# Patient Record
Sex: Female | Born: 1940 | Race: White | Hispanic: No | Smoking: Former smoker
Health system: Southern US, Community
[De-identification: ages and names within clinical notes are randomized; demographics above are authoritative.]

## PROBLEM LIST (undated history)

## (undated) DIAGNOSIS — G4730 Sleep apnea, unspecified: Secondary | ICD-10-CM

## (undated) DIAGNOSIS — D649 Anemia, unspecified: Secondary | ICD-10-CM

## (undated) DIAGNOSIS — IMO0001 Reserved for inherently not codable concepts without codable children: Secondary | ICD-10-CM

## (undated) DIAGNOSIS — I10 Essential (primary) hypertension: Secondary | ICD-10-CM

## (undated) DIAGNOSIS — I82409 Acute embolism and thrombosis of unspecified deep veins of unspecified lower extremity: Secondary | ICD-10-CM

## (undated) DIAGNOSIS — N289 Disorder of kidney and ureter, unspecified: Secondary | ICD-10-CM

## (undated) DIAGNOSIS — K219 Gastro-esophageal reflux disease without esophagitis: Secondary | ICD-10-CM

## (undated) DIAGNOSIS — F329 Major depressive disorder, single episode, unspecified: Secondary | ICD-10-CM

## (undated) DIAGNOSIS — I509 Heart failure, unspecified: Secondary | ICD-10-CM

## (undated) DIAGNOSIS — J189 Pneumonia, unspecified organism: Secondary | ICD-10-CM

## (undated) DIAGNOSIS — IMO0002 Reserved for concepts with insufficient information to code with codable children: Secondary | ICD-10-CM

## (undated) DIAGNOSIS — E7800 Pure hypercholesterolemia, unspecified: Secondary | ICD-10-CM

## (undated) DIAGNOSIS — M1990 Unspecified osteoarthritis, unspecified site: Secondary | ICD-10-CM

## (undated) DIAGNOSIS — K8020 Calculus of gallbladder without cholecystitis without obstruction: Secondary | ICD-10-CM

## (undated) DIAGNOSIS — G459 Transient cerebral ischemic attack, unspecified: Secondary | ICD-10-CM

## (undated) DIAGNOSIS — D509 Iron deficiency anemia, unspecified: Secondary | ICD-10-CM

## (undated) DIAGNOSIS — C3490 Malignant neoplasm of unspecified part of unspecified bronchus or lung: Secondary | ICD-10-CM

## (undated) DIAGNOSIS — G629 Polyneuropathy, unspecified: Secondary | ICD-10-CM

## (undated) DIAGNOSIS — E538 Deficiency of other specified B group vitamins: Secondary | ICD-10-CM

## (undated) DIAGNOSIS — Z9289 Personal history of other medical treatment: Secondary | ICD-10-CM

## (undated) DIAGNOSIS — J449 Chronic obstructive pulmonary disease, unspecified: Secondary | ICD-10-CM

## (undated) DIAGNOSIS — I739 Peripheral vascular disease, unspecified: Secondary | ICD-10-CM

## (undated) DIAGNOSIS — I2510 Atherosclerotic heart disease of native coronary artery without angina pectoris: Secondary | ICD-10-CM

## (undated) DIAGNOSIS — F32A Depression, unspecified: Secondary | ICD-10-CM

## (undated) DIAGNOSIS — E119 Type 2 diabetes mellitus without complications: Secondary | ICD-10-CM

## (undated) DIAGNOSIS — Z87440 Personal history of urinary (tract) infections: Secondary | ICD-10-CM

## (undated) DIAGNOSIS — K56609 Unspecified intestinal obstruction, unspecified as to partial versus complete obstruction: Secondary | ICD-10-CM

## (undated) HISTORY — PX: COLONOSCOPY W/ POLYPECTOMY: SHX1380

## (undated) HISTORY — PX: FOOT SURGERY: SHX648

## (undated) HISTORY — DX: Pure hypercholesterolemia, unspecified: E78.00

## (undated) HISTORY — DX: Peripheral vascular disease, unspecified: I73.9

## (undated) HISTORY — PX: LUNG BIOPSY: SHX232

## (undated) HISTORY — DX: Unspecified osteoarthritis, unspecified site: M19.90

## (undated) HISTORY — PX: EYE SURGERY: SHX253

## (undated) HISTORY — PX: TUBAL LIGATION: SHX77

## (undated) HISTORY — DX: Deficiency of other specified B group vitamins: E53.8

## (undated) HISTORY — DX: Reserved for concepts with insufficient information to code with codable children: IMO0002

## (undated) HISTORY — DX: Malignant neoplasm of unspecified part of unspecified bronchus or lung: C34.90

## (undated) HISTORY — DX: Reserved for inherently not codable concepts without codable children: IMO0001

## (undated) HISTORY — DX: Iron deficiency anemia, unspecified: D50.9

## (undated) HISTORY — DX: Heart failure, unspecified: I50.9

## (undated) HISTORY — PX: BREAST BIOPSY: SHX20

## (undated) HISTORY — DX: Atherosclerotic heart disease of native coronary artery without angina pectoris: I25.10

## (undated) HISTORY — DX: Sleep apnea, unspecified: G47.30

## (undated) HISTORY — PX: CARPAL TUNNEL RELEASE: SHX101

---

## 1988-03-23 HISTORY — PX: CAROTID ENDARTERECTOMY: SUR193

## 2011-03-30 DIAGNOSIS — E782 Mixed hyperlipidemia: Secondary | ICD-10-CM | POA: Diagnosis not present

## 2011-03-30 DIAGNOSIS — L821 Other seborrheic keratosis: Secondary | ICD-10-CM | POA: Diagnosis not present

## 2011-03-30 DIAGNOSIS — I10 Essential (primary) hypertension: Secondary | ICD-10-CM | POA: Diagnosis not present

## 2011-03-30 DIAGNOSIS — E119 Type 2 diabetes mellitus without complications: Secondary | ICD-10-CM | POA: Diagnosis not present

## 2011-04-15 DIAGNOSIS — I10 Essential (primary) hypertension: Secondary | ICD-10-CM | POA: Diagnosis not present

## 2011-04-30 DIAGNOSIS — I517 Cardiomegaly: Secondary | ICD-10-CM | POA: Diagnosis not present

## 2011-04-30 DIAGNOSIS — M810 Age-related osteoporosis without current pathological fracture: Secondary | ICD-10-CM | POA: Diagnosis not present

## 2011-04-30 DIAGNOSIS — R05 Cough: Secondary | ICD-10-CM | POA: Diagnosis not present

## 2011-04-30 DIAGNOSIS — R059 Cough, unspecified: Secondary | ICD-10-CM | POA: Diagnosis not present

## 2011-04-30 DIAGNOSIS — M069 Rheumatoid arthritis, unspecified: Secondary | ICD-10-CM | POA: Diagnosis not present

## 2011-05-13 DIAGNOSIS — Z885 Allergy status to narcotic agent status: Secondary | ICD-10-CM | POA: Diagnosis not present

## 2011-05-13 DIAGNOSIS — R195 Other fecal abnormalities: Secondary | ICD-10-CM | POA: Diagnosis not present

## 2011-05-13 DIAGNOSIS — K8020 Calculus of gallbladder without cholecystitis without obstruction: Secondary | ICD-10-CM | POA: Diagnosis not present

## 2011-05-13 DIAGNOSIS — R109 Unspecified abdominal pain: Secondary | ICD-10-CM | POA: Diagnosis not present

## 2011-05-13 DIAGNOSIS — K839 Disease of biliary tract, unspecified: Secondary | ICD-10-CM | POA: Diagnosis not present

## 2011-05-13 DIAGNOSIS — K922 Gastrointestinal hemorrhage, unspecified: Secondary | ICD-10-CM | POA: Diagnosis not present

## 2011-05-13 DIAGNOSIS — R911 Solitary pulmonary nodule: Secondary | ICD-10-CM | POA: Diagnosis not present

## 2011-05-13 DIAGNOSIS — Z87891 Personal history of nicotine dependence: Secondary | ICD-10-CM | POA: Diagnosis not present

## 2011-05-13 DIAGNOSIS — E119 Type 2 diabetes mellitus without complications: Secondary | ICD-10-CM | POA: Diagnosis not present

## 2011-05-13 DIAGNOSIS — R197 Diarrhea, unspecified: Secondary | ICD-10-CM | POA: Diagnosis not present

## 2011-05-22 DIAGNOSIS — K921 Melena: Secondary | ICD-10-CM | POA: Diagnosis not present

## 2011-06-08 DIAGNOSIS — D1430 Benign neoplasm of unspecified bronchus and lung: Secondary | ICD-10-CM | POA: Diagnosis not present

## 2011-06-08 DIAGNOSIS — K8020 Calculus of gallbladder without cholecystitis without obstruction: Secondary | ICD-10-CM | POA: Diagnosis not present

## 2011-06-09 DIAGNOSIS — M129 Arthropathy, unspecified: Secondary | ICD-10-CM | POA: Diagnosis not present

## 2011-06-09 DIAGNOSIS — K449 Diaphragmatic hernia without obstruction or gangrene: Secondary | ICD-10-CM | POA: Diagnosis not present

## 2011-06-09 DIAGNOSIS — Z79899 Other long term (current) drug therapy: Secondary | ICD-10-CM | POA: Diagnosis not present

## 2011-06-09 DIAGNOSIS — Z885 Allergy status to narcotic agent status: Secondary | ICD-10-CM | POA: Diagnosis not present

## 2011-06-09 DIAGNOSIS — K319 Disease of stomach and duodenum, unspecified: Secondary | ICD-10-CM | POA: Diagnosis not present

## 2011-06-09 DIAGNOSIS — G4733 Obstructive sleep apnea (adult) (pediatric): Secondary | ICD-10-CM | POA: Diagnosis not present

## 2011-06-09 DIAGNOSIS — K2960 Other gastritis without bleeding: Secondary | ICD-10-CM | POA: Diagnosis not present

## 2011-06-09 DIAGNOSIS — I8510 Secondary esophageal varices without bleeding: Secondary | ICD-10-CM | POA: Diagnosis not present

## 2011-06-09 DIAGNOSIS — K7460 Unspecified cirrhosis of liver: Secondary | ICD-10-CM | POA: Diagnosis not present

## 2011-06-09 DIAGNOSIS — Z794 Long term (current) use of insulin: Secondary | ICD-10-CM | POA: Diagnosis not present

## 2011-06-09 DIAGNOSIS — K922 Gastrointestinal hemorrhage, unspecified: Secondary | ICD-10-CM | POA: Diagnosis not present

## 2011-06-09 DIAGNOSIS — I10 Essential (primary) hypertension: Secondary | ICD-10-CM | POA: Diagnosis not present

## 2011-06-09 DIAGNOSIS — Z87891 Personal history of nicotine dependence: Secondary | ICD-10-CM | POA: Diagnosis not present

## 2011-06-09 DIAGNOSIS — E663 Overweight: Secondary | ICD-10-CM | POA: Diagnosis not present

## 2011-06-09 DIAGNOSIS — K219 Gastro-esophageal reflux disease without esophagitis: Secondary | ICD-10-CM | POA: Diagnosis not present

## 2011-06-09 DIAGNOSIS — E119 Type 2 diabetes mellitus without complications: Secondary | ICD-10-CM | POA: Diagnosis not present

## 2011-07-06 DIAGNOSIS — Z87891 Personal history of nicotine dependence: Secondary | ICD-10-CM | POA: Diagnosis not present

## 2011-07-06 DIAGNOSIS — M129 Arthropathy, unspecified: Secondary | ICD-10-CM | POA: Diagnosis not present

## 2011-07-06 DIAGNOSIS — K648 Other hemorrhoids: Secondary | ICD-10-CM | POA: Diagnosis not present

## 2011-07-06 DIAGNOSIS — K219 Gastro-esophageal reflux disease without esophagitis: Secondary | ICD-10-CM | POA: Diagnosis not present

## 2011-07-06 DIAGNOSIS — Z885 Allergy status to narcotic agent status: Secondary | ICD-10-CM | POA: Diagnosis not present

## 2011-07-06 DIAGNOSIS — I10 Essential (primary) hypertension: Secondary | ICD-10-CM | POA: Diagnosis not present

## 2011-07-06 DIAGNOSIS — Z79899 Other long term (current) drug therapy: Secondary | ICD-10-CM | POA: Diagnosis not present

## 2011-07-06 DIAGNOSIS — Z794 Long term (current) use of insulin: Secondary | ICD-10-CM | POA: Diagnosis not present

## 2011-07-06 DIAGNOSIS — G4733 Obstructive sleep apnea (adult) (pediatric): Secondary | ICD-10-CM | POA: Diagnosis not present

## 2011-07-06 DIAGNOSIS — K922 Gastrointestinal hemorrhage, unspecified: Secondary | ICD-10-CM | POA: Diagnosis not present

## 2011-07-06 DIAGNOSIS — E119 Type 2 diabetes mellitus without complications: Secondary | ICD-10-CM | POA: Diagnosis not present

## 2011-07-09 DIAGNOSIS — M069 Rheumatoid arthritis, unspecified: Secondary | ICD-10-CM | POA: Diagnosis not present

## 2011-07-09 DIAGNOSIS — R9389 Abnormal findings on diagnostic imaging of other specified body structures: Secondary | ICD-10-CM | POA: Diagnosis not present

## 2011-07-09 DIAGNOSIS — M25569 Pain in unspecified knee: Secondary | ICD-10-CM | POA: Diagnosis not present

## 2011-07-20 DIAGNOSIS — M25569 Pain in unspecified knee: Secondary | ICD-10-CM | POA: Diagnosis not present

## 2011-07-20 DIAGNOSIS — IMO0002 Reserved for concepts with insufficient information to code with codable children: Secondary | ICD-10-CM | POA: Diagnosis not present

## 2011-07-23 DIAGNOSIS — Z87891 Personal history of nicotine dependence: Secondary | ICD-10-CM | POA: Diagnosis not present

## 2011-07-23 DIAGNOSIS — R911 Solitary pulmonary nodule: Secondary | ICD-10-CM | POA: Diagnosis not present

## 2011-07-23 DIAGNOSIS — M069 Rheumatoid arthritis, unspecified: Secondary | ICD-10-CM | POA: Insufficient documentation

## 2011-07-23 DIAGNOSIS — E669 Obesity, unspecified: Secondary | ICD-10-CM | POA: Diagnosis not present

## 2011-07-24 DIAGNOSIS — G4733 Obstructive sleep apnea (adult) (pediatric): Secondary | ICD-10-CM | POA: Insufficient documentation

## 2011-07-24 DIAGNOSIS — M1990 Unspecified osteoarthritis, unspecified site: Secondary | ICD-10-CM | POA: Insufficient documentation

## 2011-07-24 DIAGNOSIS — J309 Allergic rhinitis, unspecified: Secondary | ICD-10-CM | POA: Insufficient documentation

## 2011-07-24 DIAGNOSIS — M25569 Pain in unspecified knee: Secondary | ICD-10-CM | POA: Diagnosis not present

## 2011-07-24 DIAGNOSIS — IMO0002 Reserved for concepts with insufficient information to code with codable children: Secondary | ICD-10-CM | POA: Diagnosis not present

## 2011-07-27 DIAGNOSIS — IMO0002 Reserved for concepts with insufficient information to code with codable children: Secondary | ICD-10-CM | POA: Diagnosis not present

## 2011-07-27 DIAGNOSIS — M25569 Pain in unspecified knee: Secondary | ICD-10-CM | POA: Diagnosis not present

## 2011-07-28 DIAGNOSIS — M1710 Unilateral primary osteoarthritis, unspecified knee: Secondary | ICD-10-CM | POA: Diagnosis not present

## 2011-07-28 DIAGNOSIS — M069 Rheumatoid arthritis, unspecified: Secondary | ICD-10-CM | POA: Diagnosis not present

## 2011-07-28 DIAGNOSIS — R9389 Abnormal findings on diagnostic imaging of other specified body structures: Secondary | ICD-10-CM | POA: Diagnosis not present

## 2011-07-28 DIAGNOSIS — M810 Age-related osteoporosis without current pathological fracture: Secondary | ICD-10-CM | POA: Diagnosis not present

## 2011-07-28 DIAGNOSIS — M2550 Pain in unspecified joint: Secondary | ICD-10-CM | POA: Diagnosis not present

## 2011-07-29 DIAGNOSIS — M25569 Pain in unspecified knee: Secondary | ICD-10-CM | POA: Diagnosis not present

## 2011-07-29 DIAGNOSIS — IMO0002 Reserved for concepts with insufficient information to code with codable children: Secondary | ICD-10-CM | POA: Diagnosis not present

## 2011-08-03 DIAGNOSIS — IMO0002 Reserved for concepts with insufficient information to code with codable children: Secondary | ICD-10-CM | POA: Diagnosis not present

## 2011-08-03 DIAGNOSIS — M25569 Pain in unspecified knee: Secondary | ICD-10-CM | POA: Diagnosis not present

## 2011-08-04 DIAGNOSIS — K8020 Calculus of gallbladder without cholecystitis without obstruction: Secondary | ICD-10-CM | POA: Insufficient documentation

## 2011-08-05 DIAGNOSIS — IMO0002 Reserved for concepts with insufficient information to code with codable children: Secondary | ICD-10-CM | POA: Diagnosis not present

## 2011-08-05 DIAGNOSIS — M25569 Pain in unspecified knee: Secondary | ICD-10-CM | POA: Diagnosis not present

## 2011-08-07 DIAGNOSIS — IMO0002 Reserved for concepts with insufficient information to code with codable children: Secondary | ICD-10-CM | POA: Diagnosis not present

## 2011-08-07 DIAGNOSIS — M25569 Pain in unspecified knee: Secondary | ICD-10-CM | POA: Diagnosis not present

## 2011-08-12 DIAGNOSIS — M25569 Pain in unspecified knee: Secondary | ICD-10-CM | POA: Diagnosis not present

## 2011-08-12 DIAGNOSIS — IMO0002 Reserved for concepts with insufficient information to code with codable children: Secondary | ICD-10-CM | POA: Diagnosis not present

## 2011-08-14 DIAGNOSIS — E119 Type 2 diabetes mellitus without complications: Secondary | ICD-10-CM | POA: Diagnosis not present

## 2011-08-14 DIAGNOSIS — I10 Essential (primary) hypertension: Secondary | ICD-10-CM | POA: Diagnosis not present

## 2011-08-14 DIAGNOSIS — M069 Rheumatoid arthritis, unspecified: Secondary | ICD-10-CM | POA: Diagnosis not present

## 2011-08-14 DIAGNOSIS — E782 Mixed hyperlipidemia: Secondary | ICD-10-CM | POA: Diagnosis not present

## 2011-08-24 DIAGNOSIS — IMO0002 Reserved for concepts with insufficient information to code with codable children: Secondary | ICD-10-CM | POA: Diagnosis not present

## 2011-08-24 DIAGNOSIS — M25569 Pain in unspecified knee: Secondary | ICD-10-CM | POA: Diagnosis not present

## 2011-08-26 DIAGNOSIS — M069 Rheumatoid arthritis, unspecified: Secondary | ICD-10-CM | POA: Diagnosis not present

## 2011-08-26 DIAGNOSIS — M159 Polyosteoarthritis, unspecified: Secondary | ICD-10-CM | POA: Diagnosis not present

## 2011-08-26 DIAGNOSIS — E119 Type 2 diabetes mellitus without complications: Secondary | ICD-10-CM | POA: Diagnosis not present

## 2011-08-26 DIAGNOSIS — M1710 Unilateral primary osteoarthritis, unspecified knee: Secondary | ICD-10-CM | POA: Diagnosis not present

## 2011-08-26 DIAGNOSIS — M25569 Pain in unspecified knee: Secondary | ICD-10-CM | POA: Diagnosis not present

## 2011-08-26 DIAGNOSIS — M2550 Pain in unspecified joint: Secondary | ICD-10-CM | POA: Diagnosis not present

## 2011-08-26 DIAGNOSIS — M810 Age-related osteoporosis without current pathological fracture: Secondary | ICD-10-CM | POA: Diagnosis not present

## 2011-09-11 DIAGNOSIS — R918 Other nonspecific abnormal finding of lung field: Secondary | ICD-10-CM | POA: Diagnosis not present

## 2011-09-11 DIAGNOSIS — E049 Nontoxic goiter, unspecified: Secondary | ICD-10-CM | POA: Diagnosis not present

## 2011-09-11 DIAGNOSIS — Z87891 Personal history of nicotine dependence: Secondary | ICD-10-CM | POA: Diagnosis not present

## 2011-09-11 DIAGNOSIS — Z8739 Personal history of other diseases of the musculoskeletal system and connective tissue: Secondary | ICD-10-CM | POA: Diagnosis not present

## 2011-09-11 DIAGNOSIS — Z885 Allergy status to narcotic agent status: Secondary | ICD-10-CM | POA: Diagnosis not present

## 2011-09-11 DIAGNOSIS — K8020 Calculus of gallbladder without cholecystitis without obstruction: Secondary | ICD-10-CM | POA: Diagnosis not present

## 2011-09-11 DIAGNOSIS — D71 Functional disorders of polymorphonuclear neutrophils: Secondary | ICD-10-CM | POA: Diagnosis not present

## 2011-09-11 DIAGNOSIS — R911 Solitary pulmonary nodule: Secondary | ICD-10-CM | POA: Diagnosis not present

## 2011-09-15 DIAGNOSIS — J449 Chronic obstructive pulmonary disease, unspecified: Secondary | ICD-10-CM | POA: Insufficient documentation

## 2011-09-15 DIAGNOSIS — R918 Other nonspecific abnormal finding of lung field: Secondary | ICD-10-CM | POA: Insufficient documentation

## 2011-09-15 DIAGNOSIS — M069 Rheumatoid arthritis, unspecified: Secondary | ICD-10-CM | POA: Diagnosis not present

## 2011-09-23 DIAGNOSIS — R918 Other nonspecific abnormal finding of lung field: Secondary | ICD-10-CM | POA: Diagnosis not present

## 2011-09-23 DIAGNOSIS — R911 Solitary pulmonary nodule: Secondary | ICD-10-CM | POA: Diagnosis not present

## 2011-09-23 DIAGNOSIS — E119 Type 2 diabetes mellitus without complications: Secondary | ICD-10-CM | POA: Diagnosis not present

## 2011-09-29 DIAGNOSIS — M810 Age-related osteoporosis without current pathological fracture: Secondary | ICD-10-CM | POA: Diagnosis not present

## 2011-10-15 DIAGNOSIS — M79609 Pain in unspecified limb: Secondary | ICD-10-CM | POA: Diagnosis not present

## 2011-10-20 DIAGNOSIS — F17200 Nicotine dependence, unspecified, uncomplicated: Secondary | ICD-10-CM | POA: Diagnosis not present

## 2011-10-20 DIAGNOSIS — I739 Peripheral vascular disease, unspecified: Secondary | ICD-10-CM | POA: Diagnosis not present

## 2011-10-20 DIAGNOSIS — Z8249 Family history of ischemic heart disease and other diseases of the circulatory system: Secondary | ICD-10-CM | POA: Diagnosis not present

## 2011-10-20 DIAGNOSIS — E119 Type 2 diabetes mellitus without complications: Secondary | ICD-10-CM | POA: Diagnosis not present

## 2011-10-20 DIAGNOSIS — I10 Essential (primary) hypertension: Secondary | ICD-10-CM | POA: Diagnosis not present

## 2011-10-20 DIAGNOSIS — Z833 Family history of diabetes mellitus: Secondary | ICD-10-CM | POA: Diagnosis not present

## 2011-10-20 DIAGNOSIS — I82409 Acute embolism and thrombosis of unspecified deep veins of unspecified lower extremity: Secondary | ICD-10-CM | POA: Diagnosis not present

## 2011-10-20 DIAGNOSIS — R609 Edema, unspecified: Secondary | ICD-10-CM | POA: Diagnosis not present

## 2011-10-20 DIAGNOSIS — G4733 Obstructive sleep apnea (adult) (pediatric): Secondary | ICD-10-CM | POA: Diagnosis not present

## 2011-10-20 DIAGNOSIS — Z79899 Other long term (current) drug therapy: Secondary | ICD-10-CM | POA: Diagnosis not present

## 2011-10-20 DIAGNOSIS — I824Y9 Acute embolism and thrombosis of unspecified deep veins of unspecified proximal lower extremity: Secondary | ICD-10-CM | POA: Diagnosis not present

## 2011-10-20 DIAGNOSIS — Z794 Long term (current) use of insulin: Secondary | ICD-10-CM | POA: Diagnosis not present

## 2011-10-22 DIAGNOSIS — I82409 Acute embolism and thrombosis of unspecified deep veins of unspecified lower extremity: Secondary | ICD-10-CM | POA: Diagnosis not present

## 2011-10-23 DIAGNOSIS — I82409 Acute embolism and thrombosis of unspecified deep veins of unspecified lower extremity: Secondary | ICD-10-CM | POA: Diagnosis not present

## 2011-10-26 DIAGNOSIS — I82409 Acute embolism and thrombosis of unspecified deep veins of unspecified lower extremity: Secondary | ICD-10-CM | POA: Diagnosis not present

## 2011-10-29 DIAGNOSIS — I82409 Acute embolism and thrombosis of unspecified deep veins of unspecified lower extremity: Secondary | ICD-10-CM | POA: Diagnosis not present

## 2011-10-29 DIAGNOSIS — M810 Age-related osteoporosis without current pathological fracture: Secondary | ICD-10-CM | POA: Diagnosis not present

## 2011-10-29 DIAGNOSIS — M069 Rheumatoid arthritis, unspecified: Secondary | ICD-10-CM | POA: Diagnosis not present

## 2011-11-02 DIAGNOSIS — I82409 Acute embolism and thrombosis of unspecified deep veins of unspecified lower extremity: Secondary | ICD-10-CM | POA: Diagnosis not present

## 2011-11-09 DIAGNOSIS — M069 Rheumatoid arthritis, unspecified: Secondary | ICD-10-CM | POA: Diagnosis not present

## 2011-11-09 DIAGNOSIS — I82409 Acute embolism and thrombosis of unspecified deep veins of unspecified lower extremity: Secondary | ICD-10-CM | POA: Diagnosis not present

## 2011-11-16 DIAGNOSIS — I82409 Acute embolism and thrombosis of unspecified deep veins of unspecified lower extremity: Secondary | ICD-10-CM | POA: Diagnosis not present

## 2011-11-24 DIAGNOSIS — I82409 Acute embolism and thrombosis of unspecified deep veins of unspecified lower extremity: Secondary | ICD-10-CM | POA: Diagnosis not present

## 2011-11-25 DIAGNOSIS — I2510 Atherosclerotic heart disease of native coronary artery without angina pectoris: Secondary | ICD-10-CM | POA: Diagnosis not present

## 2011-11-25 DIAGNOSIS — R918 Other nonspecific abnormal finding of lung field: Secondary | ICD-10-CM | POA: Diagnosis not present

## 2011-11-25 DIAGNOSIS — K8020 Calculus of gallbladder without cholecystitis without obstruction: Secondary | ICD-10-CM | POA: Diagnosis not present

## 2011-11-25 DIAGNOSIS — I2582 Chronic total occlusion of coronary artery: Secondary | ICD-10-CM | POA: Diagnosis not present

## 2011-11-25 DIAGNOSIS — R222 Localized swelling, mass and lump, trunk: Secondary | ICD-10-CM | POA: Diagnosis not present

## 2011-11-25 DIAGNOSIS — E041 Nontoxic single thyroid nodule: Secondary | ICD-10-CM | POA: Diagnosis not present

## 2011-11-25 DIAGNOSIS — I700 Atherosclerosis of aorta: Secondary | ICD-10-CM | POA: Diagnosis not present

## 2011-11-30 DIAGNOSIS — Z885 Allergy status to narcotic agent status: Secondary | ICD-10-CM | POA: Diagnosis not present

## 2011-11-30 DIAGNOSIS — J449 Chronic obstructive pulmonary disease, unspecified: Secondary | ICD-10-CM | POA: Diagnosis not present

## 2011-11-30 DIAGNOSIS — Z87891 Personal history of nicotine dependence: Secondary | ICD-10-CM | POA: Diagnosis not present

## 2011-11-30 DIAGNOSIS — R918 Other nonspecific abnormal finding of lung field: Secondary | ICD-10-CM | POA: Diagnosis not present

## 2011-11-30 DIAGNOSIS — Z7901 Long term (current) use of anticoagulants: Secondary | ICD-10-CM | POA: Diagnosis not present

## 2011-11-30 DIAGNOSIS — Z01812 Encounter for preprocedural laboratory examination: Secondary | ICD-10-CM | POA: Diagnosis not present

## 2011-11-30 DIAGNOSIS — Z01818 Encounter for other preprocedural examination: Secondary | ICD-10-CM | POA: Diagnosis not present

## 2011-11-30 DIAGNOSIS — E669 Obesity, unspecified: Secondary | ICD-10-CM | POA: Diagnosis not present

## 2011-12-03 DIAGNOSIS — E785 Hyperlipidemia, unspecified: Secondary | ICD-10-CM | POA: Diagnosis not present

## 2011-12-03 DIAGNOSIS — R918 Other nonspecific abnormal finding of lung field: Secondary | ICD-10-CM | POA: Diagnosis not present

## 2011-12-03 DIAGNOSIS — M949 Disorder of cartilage, unspecified: Secondary | ICD-10-CM | POA: Diagnosis not present

## 2011-12-03 DIAGNOSIS — G4733 Obstructive sleep apnea (adult) (pediatric): Secondary | ICD-10-CM | POA: Diagnosis not present

## 2011-12-03 DIAGNOSIS — E669 Obesity, unspecified: Secondary | ICD-10-CM | POA: Diagnosis not present

## 2011-12-03 DIAGNOSIS — R222 Localized swelling, mass and lump, trunk: Secondary | ICD-10-CM | POA: Diagnosis not present

## 2011-12-03 DIAGNOSIS — K219 Gastro-esophageal reflux disease without esophagitis: Secondary | ICD-10-CM | POA: Diagnosis not present

## 2011-12-03 DIAGNOSIS — E119 Type 2 diabetes mellitus without complications: Secondary | ICD-10-CM | POA: Diagnosis not present

## 2011-12-03 DIAGNOSIS — M1990 Unspecified osteoarthritis, unspecified site: Secondary | ICD-10-CM | POA: Diagnosis not present

## 2011-12-03 DIAGNOSIS — J449 Chronic obstructive pulmonary disease, unspecified: Secondary | ICD-10-CM | POA: Diagnosis not present

## 2011-12-03 DIAGNOSIS — I10 Essential (primary) hypertension: Secondary | ICD-10-CM | POA: Diagnosis not present

## 2011-12-03 DIAGNOSIS — Z9889 Other specified postprocedural states: Secondary | ICD-10-CM | POA: Diagnosis not present

## 2011-12-03 DIAGNOSIS — Z7901 Long term (current) use of anticoagulants: Secondary | ICD-10-CM | POA: Diagnosis not present

## 2011-12-03 DIAGNOSIS — Z87891 Personal history of nicotine dependence: Secondary | ICD-10-CM | POA: Diagnosis not present

## 2011-12-03 DIAGNOSIS — R0989 Other specified symptoms and signs involving the circulatory and respiratory systems: Secondary | ICD-10-CM | POA: Diagnosis not present

## 2011-12-03 DIAGNOSIS — M069 Rheumatoid arthritis, unspecified: Secondary | ICD-10-CM | POA: Diagnosis not present

## 2011-12-03 DIAGNOSIS — D491 Neoplasm of unspecified behavior of respiratory system: Secondary | ICD-10-CM | POA: Diagnosis not present

## 2011-12-03 DIAGNOSIS — I825Z9 Chronic embolism and thrombosis of unspecified deep veins of unspecified distal lower extremity: Secondary | ICD-10-CM | POA: Diagnosis not present

## 2011-12-08 DIAGNOSIS — I82409 Acute embolism and thrombosis of unspecified deep veins of unspecified lower extremity: Secondary | ICD-10-CM | POA: Diagnosis not present

## 2011-12-15 DIAGNOSIS — I10 Essential (primary) hypertension: Secondary | ICD-10-CM | POA: Diagnosis not present

## 2011-12-15 DIAGNOSIS — E782 Mixed hyperlipidemia: Secondary | ICD-10-CM | POA: Diagnosis not present

## 2011-12-15 DIAGNOSIS — E119 Type 2 diabetes mellitus without complications: Secondary | ICD-10-CM | POA: Diagnosis not present

## 2011-12-15 DIAGNOSIS — I82409 Acute embolism and thrombosis of unspecified deep veins of unspecified lower extremity: Secondary | ICD-10-CM | POA: Diagnosis not present

## 2011-12-15 DIAGNOSIS — Z23 Encounter for immunization: Secondary | ICD-10-CM | POA: Diagnosis not present

## 2011-12-22 DIAGNOSIS — Z01812 Encounter for preprocedural laboratory examination: Secondary | ICD-10-CM | POA: Diagnosis not present

## 2011-12-22 DIAGNOSIS — Z7901 Long term (current) use of anticoagulants: Secondary | ICD-10-CM | POA: Diagnosis not present

## 2011-12-23 DIAGNOSIS — M949 Disorder of cartilage, unspecified: Secondary | ICD-10-CM | POA: Diagnosis not present

## 2011-12-23 DIAGNOSIS — Z7901 Long term (current) use of anticoagulants: Secondary | ICD-10-CM | POA: Diagnosis not present

## 2011-12-23 DIAGNOSIS — R911 Solitary pulmonary nodule: Secondary | ICD-10-CM | POA: Diagnosis not present

## 2011-12-23 DIAGNOSIS — E119 Type 2 diabetes mellitus without complications: Secondary | ICD-10-CM | POA: Diagnosis not present

## 2011-12-23 DIAGNOSIS — K219 Gastro-esophageal reflux disease without esophagitis: Secondary | ICD-10-CM | POA: Diagnosis not present

## 2011-12-23 DIAGNOSIS — E042 Nontoxic multinodular goiter: Secondary | ICD-10-CM | POA: Diagnosis not present

## 2011-12-23 DIAGNOSIS — Z6838 Body mass index (BMI) 38.0-38.9, adult: Secondary | ICD-10-CM | POA: Diagnosis not present

## 2011-12-23 DIAGNOSIS — Z86718 Personal history of other venous thrombosis and embolism: Secondary | ICD-10-CM | POA: Diagnosis not present

## 2011-12-23 DIAGNOSIS — I517 Cardiomegaly: Secondary | ICD-10-CM | POA: Diagnosis not present

## 2011-12-23 DIAGNOSIS — H9190 Unspecified hearing loss, unspecified ear: Secondary | ICD-10-CM | POA: Diagnosis not present

## 2011-12-23 DIAGNOSIS — M129 Arthropathy, unspecified: Secondary | ICD-10-CM | POA: Diagnosis not present

## 2011-12-23 DIAGNOSIS — M069 Rheumatoid arthritis, unspecified: Secondary | ICD-10-CM | POA: Diagnosis not present

## 2011-12-23 DIAGNOSIS — I10 Essential (primary) hypertension: Secondary | ICD-10-CM | POA: Diagnosis not present

## 2011-12-23 DIAGNOSIS — R222 Localized swelling, mass and lump, trunk: Secondary | ICD-10-CM | POA: Diagnosis not present

## 2011-12-23 DIAGNOSIS — R0609 Other forms of dyspnea: Secondary | ICD-10-CM | POA: Diagnosis not present

## 2011-12-23 DIAGNOSIS — I2510 Atherosclerotic heart disease of native coronary artery without angina pectoris: Secondary | ICD-10-CM | POA: Diagnosis not present

## 2011-12-23 DIAGNOSIS — E669 Obesity, unspecified: Secondary | ICD-10-CM | POA: Diagnosis not present

## 2011-12-23 DIAGNOSIS — R918 Other nonspecific abnormal finding of lung field: Secondary | ICD-10-CM | POA: Diagnosis not present

## 2011-12-23 DIAGNOSIS — G4730 Sleep apnea, unspecified: Secondary | ICD-10-CM | POA: Diagnosis not present

## 2011-12-23 DIAGNOSIS — J449 Chronic obstructive pulmonary disease, unspecified: Secondary | ICD-10-CM | POA: Diagnosis not present

## 2011-12-23 DIAGNOSIS — Z885 Allergy status to narcotic agent status: Secondary | ICD-10-CM | POA: Diagnosis not present

## 2011-12-23 DIAGNOSIS — Z79899 Other long term (current) drug therapy: Secondary | ICD-10-CM | POA: Diagnosis not present

## 2011-12-23 DIAGNOSIS — J479 Bronchiectasis, uncomplicated: Secondary | ICD-10-CM | POA: Diagnosis not present

## 2011-12-23 DIAGNOSIS — Z87891 Personal history of nicotine dependence: Secondary | ICD-10-CM | POA: Diagnosis not present

## 2011-12-23 DIAGNOSIS — E785 Hyperlipidemia, unspecified: Secondary | ICD-10-CM | POA: Diagnosis not present

## 2011-12-24 DIAGNOSIS — I739 Peripheral vascular disease, unspecified: Secondary | ICD-10-CM | POA: Insufficient documentation

## 2011-12-24 DIAGNOSIS — I999 Unspecified disorder of circulatory system: Secondary | ICD-10-CM | POA: Insufficient documentation

## 2011-12-24 DIAGNOSIS — Z86718 Personal history of other venous thrombosis and embolism: Secondary | ICD-10-CM | POA: Insufficient documentation

## 2011-12-31 DIAGNOSIS — I10 Essential (primary) hypertension: Secondary | ICD-10-CM | POA: Diagnosis not present

## 2011-12-31 DIAGNOSIS — I82409 Acute embolism and thrombosis of unspecified deep veins of unspecified lower extremity: Secondary | ICD-10-CM | POA: Diagnosis not present

## 2012-01-05 DIAGNOSIS — M069 Rheumatoid arthritis, unspecified: Secondary | ICD-10-CM | POA: Diagnosis not present

## 2012-01-18 DIAGNOSIS — E119 Type 2 diabetes mellitus without complications: Secondary | ICD-10-CM | POA: Diagnosis not present

## 2012-01-18 DIAGNOSIS — R9389 Abnormal findings on diagnostic imaging of other specified body structures: Secondary | ICD-10-CM | POA: Diagnosis not present

## 2012-01-18 DIAGNOSIS — M1710 Unilateral primary osteoarthritis, unspecified knee: Secondary | ICD-10-CM | POA: Diagnosis not present

## 2012-01-18 DIAGNOSIS — M810 Age-related osteoporosis without current pathological fracture: Secondary | ICD-10-CM | POA: Diagnosis not present

## 2012-01-18 DIAGNOSIS — I10 Essential (primary) hypertension: Secondary | ICD-10-CM | POA: Diagnosis not present

## 2012-01-18 DIAGNOSIS — M159 Polyosteoarthritis, unspecified: Secondary | ICD-10-CM | POA: Diagnosis not present

## 2012-01-18 DIAGNOSIS — M069 Rheumatoid arthritis, unspecified: Secondary | ICD-10-CM | POA: Diagnosis not present

## 2012-01-20 DIAGNOSIS — I82409 Acute embolism and thrombosis of unspecified deep veins of unspecified lower extremity: Secondary | ICD-10-CM | POA: Diagnosis not present

## 2012-02-02 DIAGNOSIS — M069 Rheumatoid arthritis, unspecified: Secondary | ICD-10-CM | POA: Diagnosis not present

## 2012-02-03 DIAGNOSIS — I82409 Acute embolism and thrombosis of unspecified deep veins of unspecified lower extremity: Secondary | ICD-10-CM | POA: Diagnosis not present

## 2012-02-04 DIAGNOSIS — M069 Rheumatoid arthritis, unspecified: Secondary | ICD-10-CM | POA: Diagnosis not present

## 2012-02-04 DIAGNOSIS — M810 Age-related osteoporosis without current pathological fracture: Secondary | ICD-10-CM | POA: Diagnosis not present

## 2012-02-04 DIAGNOSIS — E559 Vitamin D deficiency, unspecified: Secondary | ICD-10-CM | POA: Diagnosis not present

## 2012-02-24 DIAGNOSIS — I82409 Acute embolism and thrombosis of unspecified deep veins of unspecified lower extremity: Secondary | ICD-10-CM | POA: Diagnosis not present

## 2012-02-26 DIAGNOSIS — M810 Age-related osteoporosis without current pathological fracture: Secondary | ICD-10-CM | POA: Diagnosis not present

## 2012-03-17 DIAGNOSIS — I82409 Acute embolism and thrombosis of unspecified deep veins of unspecified lower extremity: Secondary | ICD-10-CM | POA: Diagnosis not present

## 2012-03-23 DIAGNOSIS — J189 Pneumonia, unspecified organism: Secondary | ICD-10-CM

## 2012-03-23 HISTORY — DX: Pneumonia, unspecified organism: J18.9

## 2012-04-07 DIAGNOSIS — I82409 Acute embolism and thrombosis of unspecified deep veins of unspecified lower extremity: Secondary | ICD-10-CM | POA: Diagnosis not present

## 2012-05-12 DIAGNOSIS — M069 Rheumatoid arthritis, unspecified: Secondary | ICD-10-CM | POA: Diagnosis not present

## 2012-05-12 DIAGNOSIS — M810 Age-related osteoporosis without current pathological fracture: Secondary | ICD-10-CM | POA: Diagnosis not present

## 2012-05-12 DIAGNOSIS — E559 Vitamin D deficiency, unspecified: Secondary | ICD-10-CM | POA: Diagnosis not present

## 2012-05-23 DIAGNOSIS — E119 Type 2 diabetes mellitus without complications: Secondary | ICD-10-CM | POA: Diagnosis not present

## 2012-05-23 DIAGNOSIS — E782 Mixed hyperlipidemia: Secondary | ICD-10-CM | POA: Diagnosis not present

## 2012-05-23 DIAGNOSIS — I10 Essential (primary) hypertension: Secondary | ICD-10-CM | POA: Diagnosis not present

## 2012-05-23 DIAGNOSIS — K219 Gastro-esophageal reflux disease without esophagitis: Secondary | ICD-10-CM | POA: Diagnosis not present

## 2012-06-01 DIAGNOSIS — I10 Essential (primary) hypertension: Secondary | ICD-10-CM | POA: Diagnosis not present

## 2012-06-01 DIAGNOSIS — E119 Type 2 diabetes mellitus without complications: Secondary | ICD-10-CM | POA: Diagnosis not present

## 2012-06-01 DIAGNOSIS — E782 Mixed hyperlipidemia: Secondary | ICD-10-CM | POA: Diagnosis not present

## 2012-06-02 DIAGNOSIS — M069 Rheumatoid arthritis, unspecified: Secondary | ICD-10-CM | POA: Diagnosis not present

## 2012-06-16 DIAGNOSIS — M069 Rheumatoid arthritis, unspecified: Secondary | ICD-10-CM | POA: Diagnosis not present

## 2012-06-21 DIAGNOSIS — Z87891 Personal history of nicotine dependence: Secondary | ICD-10-CM | POA: Diagnosis not present

## 2012-06-21 DIAGNOSIS — J449 Chronic obstructive pulmonary disease, unspecified: Secondary | ICD-10-CM | POA: Diagnosis not present

## 2012-06-21 DIAGNOSIS — E669 Obesity, unspecified: Secondary | ICD-10-CM | POA: Insufficient documentation

## 2012-06-21 DIAGNOSIS — R918 Other nonspecific abnormal finding of lung field: Secondary | ICD-10-CM | POA: Diagnosis not present

## 2012-06-21 DIAGNOSIS — IMO0002 Reserved for concepts with insufficient information to code with codable children: Secondary | ICD-10-CM | POA: Insufficient documentation

## 2012-06-21 DIAGNOSIS — R0989 Other specified symptoms and signs involving the circulatory and respiratory systems: Secondary | ICD-10-CM | POA: Diagnosis not present

## 2012-06-27 DIAGNOSIS — I82409 Acute embolism and thrombosis of unspecified deep veins of unspecified lower extremity: Secondary | ICD-10-CM | POA: Diagnosis not present

## 2012-07-20 DIAGNOSIS — I749 Embolism and thrombosis of unspecified artery: Secondary | ICD-10-CM | POA: Diagnosis not present

## 2012-07-27 DIAGNOSIS — E119 Type 2 diabetes mellitus without complications: Secondary | ICD-10-CM | POA: Diagnosis not present

## 2012-07-27 DIAGNOSIS — I82409 Acute embolism and thrombosis of unspecified deep veins of unspecified lower extremity: Secondary | ICD-10-CM | POA: Diagnosis not present

## 2012-07-27 DIAGNOSIS — I6529 Occlusion and stenosis of unspecified carotid artery: Secondary | ICD-10-CM | POA: Insufficient documentation

## 2012-07-27 DIAGNOSIS — F32A Depression, unspecified: Secondary | ICD-10-CM | POA: Insufficient documentation

## 2012-07-27 DIAGNOSIS — F329 Major depressive disorder, single episode, unspecified: Secondary | ICD-10-CM | POA: Insufficient documentation

## 2012-07-27 DIAGNOSIS — M069 Rheumatoid arthritis, unspecified: Secondary | ICD-10-CM | POA: Diagnosis not present

## 2012-07-27 DIAGNOSIS — H9190 Unspecified hearing loss, unspecified ear: Secondary | ICD-10-CM | POA: Insufficient documentation

## 2012-07-27 DIAGNOSIS — J449 Chronic obstructive pulmonary disease, unspecified: Secondary | ICD-10-CM | POA: Diagnosis not present

## 2012-07-28 DIAGNOSIS — I10 Essential (primary) hypertension: Secondary | ICD-10-CM | POA: Diagnosis not present

## 2012-07-28 DIAGNOSIS — I749 Embolism and thrombosis of unspecified artery: Secondary | ICD-10-CM | POA: Diagnosis not present

## 2012-07-28 DIAGNOSIS — I82409 Acute embolism and thrombosis of unspecified deep veins of unspecified lower extremity: Secondary | ICD-10-CM | POA: Diagnosis not present

## 2012-07-28 DIAGNOSIS — E785 Hyperlipidemia, unspecified: Secondary | ICD-10-CM | POA: Diagnosis not present

## 2012-08-01 DIAGNOSIS — J449 Chronic obstructive pulmonary disease, unspecified: Secondary | ICD-10-CM | POA: Diagnosis not present

## 2012-08-01 DIAGNOSIS — E785 Hyperlipidemia, unspecified: Secondary | ICD-10-CM | POA: Diagnosis not present

## 2012-08-01 DIAGNOSIS — Z86718 Personal history of other venous thrombosis and embolism: Secondary | ICD-10-CM | POA: Diagnosis not present

## 2012-08-01 DIAGNOSIS — Z86711 Personal history of pulmonary embolism: Secondary | ICD-10-CM | POA: Diagnosis not present

## 2012-08-01 DIAGNOSIS — I10 Essential (primary) hypertension: Secondary | ICD-10-CM | POA: Diagnosis not present

## 2012-08-01 DIAGNOSIS — E119 Type 2 diabetes mellitus without complications: Secondary | ICD-10-CM | POA: Diagnosis not present

## 2012-08-01 DIAGNOSIS — M069 Rheumatoid arthritis, unspecified: Secondary | ICD-10-CM | POA: Diagnosis not present

## 2012-08-01 DIAGNOSIS — I749 Embolism and thrombosis of unspecified artery: Secondary | ICD-10-CM | POA: Diagnosis not present

## 2012-08-01 DIAGNOSIS — M810 Age-related osteoporosis without current pathological fracture: Secondary | ICD-10-CM | POA: Diagnosis not present

## 2012-08-17 DIAGNOSIS — I749 Embolism and thrombosis of unspecified artery: Secondary | ICD-10-CM | POA: Diagnosis not present

## 2012-09-01 DIAGNOSIS — K219 Gastro-esophageal reflux disease without esophagitis: Secondary | ICD-10-CM | POA: Diagnosis not present

## 2012-09-01 DIAGNOSIS — E119 Type 2 diabetes mellitus without complications: Secondary | ICD-10-CM | POA: Diagnosis not present

## 2012-09-01 DIAGNOSIS — E875 Hyperkalemia: Secondary | ICD-10-CM | POA: Diagnosis not present

## 2012-09-01 DIAGNOSIS — N289 Disorder of kidney and ureter, unspecified: Secondary | ICD-10-CM | POA: Insufficient documentation

## 2012-09-01 DIAGNOSIS — I10 Essential (primary) hypertension: Secondary | ICD-10-CM | POA: Diagnosis not present

## 2012-09-01 DIAGNOSIS — D649 Anemia, unspecified: Secondary | ICD-10-CM | POA: Diagnosis not present

## 2012-09-01 DIAGNOSIS — I749 Embolism and thrombosis of unspecified artery: Secondary | ICD-10-CM | POA: Diagnosis not present

## 2012-09-01 DIAGNOSIS — M069 Rheumatoid arthritis, unspecified: Secondary | ICD-10-CM | POA: Diagnosis not present

## 2012-09-12 DIAGNOSIS — I749 Embolism and thrombosis of unspecified artery: Secondary | ICD-10-CM | POA: Diagnosis not present

## 2012-09-19 DIAGNOSIS — I749 Embolism and thrombosis of unspecified artery: Secondary | ICD-10-CM | POA: Diagnosis not present

## 2012-09-26 DIAGNOSIS — I749 Embolism and thrombosis of unspecified artery: Secondary | ICD-10-CM | POA: Diagnosis not present

## 2012-09-26 DIAGNOSIS — J449 Chronic obstructive pulmonary disease, unspecified: Secondary | ICD-10-CM | POA: Diagnosis not present

## 2012-09-26 DIAGNOSIS — K8020 Calculus of gallbladder without cholecystitis without obstruction: Secondary | ICD-10-CM | POA: Diagnosis not present

## 2012-09-26 DIAGNOSIS — N289 Disorder of kidney and ureter, unspecified: Secondary | ICD-10-CM | POA: Diagnosis not present

## 2012-09-26 DIAGNOSIS — J984 Other disorders of lung: Secondary | ICD-10-CM | POA: Diagnosis not present

## 2012-09-26 DIAGNOSIS — R509 Fever, unspecified: Secondary | ICD-10-CM | POA: Diagnosis not present

## 2012-09-29 DIAGNOSIS — N289 Disorder of kidney and ureter, unspecified: Secondary | ICD-10-CM | POA: Diagnosis not present

## 2012-09-29 DIAGNOSIS — R509 Fever, unspecified: Secondary | ICD-10-CM | POA: Diagnosis not present

## 2012-09-29 DIAGNOSIS — E875 Hyperkalemia: Secondary | ICD-10-CM | POA: Diagnosis not present

## 2012-09-29 DIAGNOSIS — I10 Essential (primary) hypertension: Secondary | ICD-10-CM | POA: Diagnosis not present

## 2012-10-01 DIAGNOSIS — K219 Gastro-esophageal reflux disease without esophagitis: Secondary | ICD-10-CM | POA: Diagnosis not present

## 2012-10-01 DIAGNOSIS — Z87891 Personal history of nicotine dependence: Secondary | ICD-10-CM | POA: Diagnosis not present

## 2012-10-01 DIAGNOSIS — Z794 Long term (current) use of insulin: Secondary | ICD-10-CM | POA: Diagnosis not present

## 2012-10-01 DIAGNOSIS — J90 Pleural effusion, not elsewhere classified: Secondary | ICD-10-CM | POA: Diagnosis not present

## 2012-10-01 DIAGNOSIS — J189 Pneumonia, unspecified organism: Secondary | ICD-10-CM | POA: Diagnosis not present

## 2012-10-01 DIAGNOSIS — Z86718 Personal history of other venous thrombosis and embolism: Secondary | ICD-10-CM | POA: Diagnosis not present

## 2012-10-01 DIAGNOSIS — R040 Epistaxis: Secondary | ICD-10-CM | POA: Diagnosis not present

## 2012-10-01 DIAGNOSIS — R791 Abnormal coagulation profile: Secondary | ICD-10-CM | POA: Diagnosis not present

## 2012-10-01 DIAGNOSIS — I129 Hypertensive chronic kidney disease with stage 1 through stage 4 chronic kidney disease, or unspecified chronic kidney disease: Secondary | ICD-10-CM | POA: Diagnosis not present

## 2012-10-01 DIAGNOSIS — J449 Chronic obstructive pulmonary disease, unspecified: Secondary | ICD-10-CM | POA: Diagnosis not present

## 2012-10-01 DIAGNOSIS — N189 Chronic kidney disease, unspecified: Secondary | ICD-10-CM | POA: Diagnosis not present

## 2012-10-01 DIAGNOSIS — M069 Rheumatoid arthritis, unspecified: Secondary | ICD-10-CM | POA: Diagnosis not present

## 2012-10-01 DIAGNOSIS — E785 Hyperlipidemia, unspecified: Secondary | ICD-10-CM | POA: Diagnosis not present

## 2012-10-01 DIAGNOSIS — E119 Type 2 diabetes mellitus without complications: Secondary | ICD-10-CM | POA: Diagnosis not present

## 2012-10-01 DIAGNOSIS — R011 Cardiac murmur, unspecified: Secondary | ICD-10-CM | POA: Diagnosis not present

## 2012-10-02 DIAGNOSIS — R791 Abnormal coagulation profile: Secondary | ICD-10-CM | POA: Diagnosis present

## 2012-10-02 DIAGNOSIS — R0602 Shortness of breath: Secondary | ICD-10-CM | POA: Diagnosis not present

## 2012-10-02 DIAGNOSIS — R040 Epistaxis: Secondary | ICD-10-CM | POA: Diagnosis not present

## 2012-10-02 DIAGNOSIS — I10 Essential (primary) hypertension: Secondary | ICD-10-CM | POA: Diagnosis not present

## 2012-10-02 DIAGNOSIS — I129 Hypertensive chronic kidney disease with stage 1 through stage 4 chronic kidney disease, or unspecified chronic kidney disease: Secondary | ICD-10-CM | POA: Diagnosis present

## 2012-10-02 DIAGNOSIS — M069 Rheumatoid arthritis, unspecified: Secondary | ICD-10-CM | POA: Diagnosis present

## 2012-10-02 DIAGNOSIS — J90 Pleural effusion, not elsewhere classified: Secondary | ICD-10-CM | POA: Diagnosis not present

## 2012-10-02 DIAGNOSIS — J189 Pneumonia, unspecified organism: Secondary | ICD-10-CM | POA: Diagnosis not present

## 2012-10-02 DIAGNOSIS — Z794 Long term (current) use of insulin: Secondary | ICD-10-CM | POA: Diagnosis not present

## 2012-10-02 DIAGNOSIS — N189 Chronic kidney disease, unspecified: Secondary | ICD-10-CM | POA: Diagnosis present

## 2012-10-02 DIAGNOSIS — J449 Chronic obstructive pulmonary disease, unspecified: Secondary | ICD-10-CM | POA: Diagnosis not present

## 2012-10-02 DIAGNOSIS — R011 Cardiac murmur, unspecified: Secondary | ICD-10-CM | POA: Diagnosis present

## 2012-10-02 DIAGNOSIS — E785 Hyperlipidemia, unspecified: Secondary | ICD-10-CM | POA: Diagnosis not present

## 2012-10-02 DIAGNOSIS — E119 Type 2 diabetes mellitus without complications: Secondary | ICD-10-CM | POA: Diagnosis not present

## 2012-10-02 DIAGNOSIS — Z86718 Personal history of other venous thrombosis and embolism: Secondary | ICD-10-CM | POA: Diagnosis not present

## 2012-10-02 DIAGNOSIS — Z87891 Personal history of nicotine dependence: Secondary | ICD-10-CM | POA: Diagnosis not present

## 2012-10-02 DIAGNOSIS — K219 Gastro-esophageal reflux disease without esophagitis: Secondary | ICD-10-CM | POA: Diagnosis not present

## 2012-10-03 DIAGNOSIS — I82409 Acute embolism and thrombosis of unspecified deep veins of unspecified lower extremity: Secondary | ICD-10-CM | POA: Diagnosis not present

## 2012-10-05 DIAGNOSIS — I2789 Other specified pulmonary heart diseases: Secondary | ICD-10-CM | POA: Diagnosis not present

## 2012-10-05 DIAGNOSIS — R509 Fever, unspecified: Secondary | ICD-10-CM | POA: Diagnosis not present

## 2012-10-05 DIAGNOSIS — R700 Elevated erythrocyte sedimentation rate: Secondary | ICD-10-CM | POA: Diagnosis not present

## 2012-10-05 DIAGNOSIS — I517 Cardiomegaly: Secondary | ICD-10-CM | POA: Diagnosis not present

## 2012-10-05 DIAGNOSIS — I080 Rheumatic disorders of both mitral and aortic valves: Secondary | ICD-10-CM | POA: Diagnosis not present

## 2012-10-05 DIAGNOSIS — I079 Rheumatic tricuspid valve disease, unspecified: Secondary | ICD-10-CM | POA: Diagnosis not present

## 2012-10-10 DIAGNOSIS — R509 Fever, unspecified: Secondary | ICD-10-CM | POA: Diagnosis not present

## 2012-10-10 DIAGNOSIS — I82409 Acute embolism and thrombosis of unspecified deep veins of unspecified lower extremity: Secondary | ICD-10-CM | POA: Diagnosis not present

## 2012-10-10 DIAGNOSIS — J189 Pneumonia, unspecified organism: Secondary | ICD-10-CM | POA: Diagnosis not present

## 2012-10-10 DIAGNOSIS — N289 Disorder of kidney and ureter, unspecified: Secondary | ICD-10-CM | POA: Diagnosis not present

## 2012-10-10 DIAGNOSIS — I10 Essential (primary) hypertension: Secondary | ICD-10-CM | POA: Diagnosis not present

## 2012-10-10 DIAGNOSIS — E119 Type 2 diabetes mellitus without complications: Secondary | ICD-10-CM | POA: Diagnosis not present

## 2012-10-10 DIAGNOSIS — M064 Inflammatory polyarthropathy: Secondary | ICD-10-CM | POA: Diagnosis not present

## 2012-10-14 DIAGNOSIS — R918 Other nonspecific abnormal finding of lung field: Secondary | ICD-10-CM | POA: Diagnosis not present

## 2012-10-14 DIAGNOSIS — R509 Fever, unspecified: Secondary | ICD-10-CM | POA: Diagnosis not present

## 2012-10-17 DIAGNOSIS — M47817 Spondylosis without myelopathy or radiculopathy, lumbosacral region: Secondary | ICD-10-CM | POA: Diagnosis not present

## 2012-10-17 DIAGNOSIS — R509 Fever, unspecified: Secondary | ICD-10-CM | POA: Diagnosis not present

## 2012-10-17 DIAGNOSIS — R32 Unspecified urinary incontinence: Secondary | ICD-10-CM | POA: Diagnosis not present

## 2012-10-17 DIAGNOSIS — M1710 Unilateral primary osteoarthritis, unspecified knee: Secondary | ICD-10-CM | POA: Diagnosis not present

## 2012-10-17 DIAGNOSIS — M159 Polyosteoarthritis, unspecified: Secondary | ICD-10-CM | POA: Diagnosis not present

## 2012-10-17 DIAGNOSIS — R700 Elevated erythrocyte sedimentation rate: Secondary | ICD-10-CM | POA: Diagnosis not present

## 2012-10-20 DIAGNOSIS — J449 Chronic obstructive pulmonary disease, unspecified: Secondary | ICD-10-CM | POA: Diagnosis not present

## 2012-10-20 DIAGNOSIS — R509 Fever, unspecified: Secondary | ICD-10-CM | POA: Diagnosis not present

## 2012-10-20 DIAGNOSIS — J4489 Other specified chronic obstructive pulmonary disease: Secondary | ICD-10-CM | POA: Diagnosis not present

## 2012-10-20 DIAGNOSIS — R948 Abnormal results of function studies of other organs and systems: Secondary | ICD-10-CM | POA: Diagnosis not present

## 2012-10-20 DIAGNOSIS — Z7901 Long term (current) use of anticoagulants: Secondary | ICD-10-CM | POA: Diagnosis not present

## 2012-10-20 DIAGNOSIS — R918 Other nonspecific abnormal finding of lung field: Secondary | ICD-10-CM | POA: Diagnosis not present

## 2012-10-20 DIAGNOSIS — I82409 Acute embolism and thrombosis of unspecified deep veins of unspecified lower extremity: Secondary | ICD-10-CM | POA: Diagnosis not present

## 2012-10-20 DIAGNOSIS — R700 Elevated erythrocyte sedimentation rate: Secondary | ICD-10-CM | POA: Insufficient documentation

## 2012-10-20 DIAGNOSIS — E875 Hyperkalemia: Secondary | ICD-10-CM | POA: Diagnosis not present

## 2012-11-03 DIAGNOSIS — J449 Chronic obstructive pulmonary disease, unspecified: Secondary | ICD-10-CM | POA: Diagnosis not present

## 2012-11-03 DIAGNOSIS — I82409 Acute embolism and thrombosis of unspecified deep veins of unspecified lower extremity: Secondary | ICD-10-CM | POA: Diagnosis not present

## 2012-11-03 DIAGNOSIS — R918 Other nonspecific abnormal finding of lung field: Secondary | ICD-10-CM | POA: Diagnosis not present

## 2012-11-03 DIAGNOSIS — R0989 Other specified symptoms and signs involving the circulatory and respiratory systems: Secondary | ICD-10-CM | POA: Diagnosis not present

## 2012-11-03 DIAGNOSIS — R0609 Other forms of dyspnea: Secondary | ICD-10-CM | POA: Diagnosis not present

## 2012-11-25 DIAGNOSIS — E875 Hyperkalemia: Secondary | ICD-10-CM | POA: Diagnosis not present

## 2012-11-25 DIAGNOSIS — I82409 Acute embolism and thrombosis of unspecified deep veins of unspecified lower extremity: Secondary | ICD-10-CM | POA: Diagnosis not present

## 2012-11-25 DIAGNOSIS — E119 Type 2 diabetes mellitus without complications: Secondary | ICD-10-CM | POA: Diagnosis not present

## 2012-11-25 DIAGNOSIS — Z7901 Long term (current) use of anticoagulants: Secondary | ICD-10-CM | POA: Diagnosis not present

## 2012-11-25 DIAGNOSIS — R700 Elevated erythrocyte sedimentation rate: Secondary | ICD-10-CM | POA: Diagnosis not present

## 2012-12-07 DIAGNOSIS — Z713 Dietary counseling and surveillance: Secondary | ICD-10-CM | POA: Diagnosis not present

## 2012-12-07 DIAGNOSIS — E119 Type 2 diabetes mellitus without complications: Secondary | ICD-10-CM | POA: Diagnosis not present

## 2012-12-16 DIAGNOSIS — Z5189 Encounter for other specified aftercare: Secondary | ICD-10-CM | POA: Diagnosis not present

## 2012-12-16 DIAGNOSIS — J449 Chronic obstructive pulmonary disease, unspecified: Secondary | ICD-10-CM | POA: Diagnosis not present

## 2012-12-19 DIAGNOSIS — E119 Type 2 diabetes mellitus without complications: Secondary | ICD-10-CM | POA: Diagnosis not present

## 2012-12-19 DIAGNOSIS — Z713 Dietary counseling and surveillance: Secondary | ICD-10-CM | POA: Diagnosis not present

## 2012-12-20 DIAGNOSIS — Z7901 Long term (current) use of anticoagulants: Secondary | ICD-10-CM | POA: Diagnosis not present

## 2012-12-20 DIAGNOSIS — I82409 Acute embolism and thrombosis of unspecified deep veins of unspecified lower extremity: Secondary | ICD-10-CM | POA: Diagnosis not present

## 2012-12-21 DIAGNOSIS — J449 Chronic obstructive pulmonary disease, unspecified: Secondary | ICD-10-CM | POA: Diagnosis not present

## 2012-12-21 DIAGNOSIS — R269 Unspecified abnormalities of gait and mobility: Secondary | ICD-10-CM | POA: Diagnosis not present

## 2012-12-21 DIAGNOSIS — IMO0001 Reserved for inherently not codable concepts without codable children: Secondary | ICD-10-CM | POA: Diagnosis not present

## 2012-12-21 DIAGNOSIS — Z5189 Encounter for other specified aftercare: Secondary | ICD-10-CM | POA: Diagnosis not present

## 2012-12-28 DIAGNOSIS — Z5189 Encounter for other specified aftercare: Secondary | ICD-10-CM | POA: Diagnosis not present

## 2012-12-28 DIAGNOSIS — J449 Chronic obstructive pulmonary disease, unspecified: Secondary | ICD-10-CM | POA: Diagnosis not present

## 2012-12-30 DIAGNOSIS — Z5189 Encounter for other specified aftercare: Secondary | ICD-10-CM | POA: Diagnosis not present

## 2012-12-30 DIAGNOSIS — J449 Chronic obstructive pulmonary disease, unspecified: Secondary | ICD-10-CM | POA: Diagnosis not present

## 2013-01-03 DIAGNOSIS — E119 Type 2 diabetes mellitus without complications: Secondary | ICD-10-CM | POA: Diagnosis not present

## 2013-01-03 DIAGNOSIS — Z713 Dietary counseling and surveillance: Secondary | ICD-10-CM | POA: Diagnosis not present

## 2013-01-06 DIAGNOSIS — R918 Other nonspecific abnormal finding of lung field: Secondary | ICD-10-CM | POA: Diagnosis not present

## 2013-01-06 DIAGNOSIS — J449 Chronic obstructive pulmonary disease, unspecified: Secondary | ICD-10-CM | POA: Diagnosis not present

## 2013-01-06 DIAGNOSIS — Z5189 Encounter for other specified aftercare: Secondary | ICD-10-CM | POA: Diagnosis not present

## 2013-01-09 DIAGNOSIS — J449 Chronic obstructive pulmonary disease, unspecified: Secondary | ICD-10-CM | POA: Diagnosis not present

## 2013-01-09 DIAGNOSIS — Z5189 Encounter for other specified aftercare: Secondary | ICD-10-CM | POA: Diagnosis not present

## 2013-01-09 DIAGNOSIS — R918 Other nonspecific abnormal finding of lung field: Secondary | ICD-10-CM | POA: Diagnosis not present

## 2013-01-10 DIAGNOSIS — Z713 Dietary counseling and surveillance: Secondary | ICD-10-CM | POA: Diagnosis not present

## 2013-01-10 DIAGNOSIS — E119 Type 2 diabetes mellitus without complications: Secondary | ICD-10-CM | POA: Diagnosis not present

## 2013-01-13 DIAGNOSIS — Z5189 Encounter for other specified aftercare: Secondary | ICD-10-CM | POA: Diagnosis not present

## 2013-01-13 DIAGNOSIS — J449 Chronic obstructive pulmonary disease, unspecified: Secondary | ICD-10-CM | POA: Diagnosis not present

## 2013-01-13 DIAGNOSIS — R918 Other nonspecific abnormal finding of lung field: Secondary | ICD-10-CM | POA: Diagnosis not present

## 2013-01-16 DIAGNOSIS — J449 Chronic obstructive pulmonary disease, unspecified: Secondary | ICD-10-CM | POA: Diagnosis not present

## 2013-01-16 DIAGNOSIS — Z5189 Encounter for other specified aftercare: Secondary | ICD-10-CM | POA: Diagnosis not present

## 2013-01-16 DIAGNOSIS — R918 Other nonspecific abnormal finding of lung field: Secondary | ICD-10-CM | POA: Diagnosis not present

## 2013-01-20 DIAGNOSIS — Z5189 Encounter for other specified aftercare: Secondary | ICD-10-CM | POA: Diagnosis not present

## 2013-01-20 DIAGNOSIS — Z7901 Long term (current) use of anticoagulants: Secondary | ICD-10-CM | POA: Diagnosis not present

## 2013-01-20 DIAGNOSIS — R918 Other nonspecific abnormal finding of lung field: Secondary | ICD-10-CM | POA: Diagnosis not present

## 2013-01-20 DIAGNOSIS — J449 Chronic obstructive pulmonary disease, unspecified: Secondary | ICD-10-CM | POA: Diagnosis not present

## 2013-01-20 DIAGNOSIS — I82409 Acute embolism and thrombosis of unspecified deep veins of unspecified lower extremity: Secondary | ICD-10-CM | POA: Diagnosis not present

## 2013-01-26 DIAGNOSIS — I82409 Acute embolism and thrombosis of unspecified deep veins of unspecified lower extremity: Secondary | ICD-10-CM | POA: Diagnosis not present

## 2013-01-26 DIAGNOSIS — Z7901 Long term (current) use of anticoagulants: Secondary | ICD-10-CM | POA: Diagnosis not present

## 2013-02-02 DIAGNOSIS — I82409 Acute embolism and thrombosis of unspecified deep veins of unspecified lower extremity: Secondary | ICD-10-CM | POA: Diagnosis not present

## 2013-02-02 DIAGNOSIS — Z7901 Long term (current) use of anticoagulants: Secondary | ICD-10-CM | POA: Diagnosis not present

## 2013-02-07 DIAGNOSIS — E119 Type 2 diabetes mellitus without complications: Secondary | ICD-10-CM | POA: Diagnosis not present

## 2013-02-07 DIAGNOSIS — Z713 Dietary counseling and surveillance: Secondary | ICD-10-CM | POA: Diagnosis not present

## 2013-03-08 DIAGNOSIS — Z713 Dietary counseling and surveillance: Secondary | ICD-10-CM | POA: Diagnosis not present

## 2013-03-08 DIAGNOSIS — E119 Type 2 diabetes mellitus without complications: Secondary | ICD-10-CM | POA: Diagnosis not present

## 2013-03-09 DIAGNOSIS — Z7901 Long term (current) use of anticoagulants: Secondary | ICD-10-CM | POA: Diagnosis not present

## 2013-04-11 DIAGNOSIS — E119 Type 2 diabetes mellitus without complications: Secondary | ICD-10-CM | POA: Diagnosis not present

## 2013-04-11 DIAGNOSIS — L84 Corns and callosities: Secondary | ICD-10-CM | POA: Diagnosis not present

## 2013-04-11 DIAGNOSIS — Z7901 Long term (current) use of anticoagulants: Secondary | ICD-10-CM | POA: Diagnosis not present

## 2013-04-11 DIAGNOSIS — E785 Hyperlipidemia, unspecified: Secondary | ICD-10-CM | POA: Diagnosis not present

## 2013-04-11 DIAGNOSIS — Z0000 Encounter for general adult medical examination without abnormal findings: Secondary | ICD-10-CM | POA: Diagnosis not present

## 2013-04-11 DIAGNOSIS — I10 Essential (primary) hypertension: Secondary | ICD-10-CM | POA: Diagnosis not present

## 2013-04-11 DIAGNOSIS — I82409 Acute embolism and thrombosis of unspecified deep veins of unspecified lower extremity: Secondary | ICD-10-CM | POA: Diagnosis not present

## 2013-04-11 DIAGNOSIS — J449 Chronic obstructive pulmonary disease, unspecified: Secondary | ICD-10-CM | POA: Diagnosis not present

## 2013-04-11 DIAGNOSIS — R700 Elevated erythrocyte sedimentation rate: Secondary | ICD-10-CM | POA: Diagnosis not present

## 2013-04-27 DIAGNOSIS — I2510 Atherosclerotic heart disease of native coronary artery without angina pectoris: Secondary | ICD-10-CM | POA: Diagnosis not present

## 2013-04-27 DIAGNOSIS — R911 Solitary pulmonary nodule: Secondary | ICD-10-CM | POA: Diagnosis not present

## 2013-04-27 DIAGNOSIS — E041 Nontoxic single thyroid nodule: Secondary | ICD-10-CM | POA: Diagnosis not present

## 2013-04-27 DIAGNOSIS — R918 Other nonspecific abnormal finding of lung field: Secondary | ICD-10-CM | POA: Diagnosis not present

## 2013-05-02 DIAGNOSIS — R911 Solitary pulmonary nodule: Secondary | ICD-10-CM | POA: Diagnosis not present

## 2013-05-02 DIAGNOSIS — J449 Chronic obstructive pulmonary disease, unspecified: Secondary | ICD-10-CM | POA: Diagnosis not present

## 2013-05-11 DIAGNOSIS — Z1231 Encounter for screening mammogram for malignant neoplasm of breast: Secondary | ICD-10-CM | POA: Diagnosis not present

## 2013-05-11 DIAGNOSIS — I82409 Acute embolism and thrombosis of unspecified deep veins of unspecified lower extremity: Secondary | ICD-10-CM | POA: Diagnosis not present

## 2013-05-11 DIAGNOSIS — Z7901 Long term (current) use of anticoagulants: Secondary | ICD-10-CM | POA: Diagnosis not present

## 2013-06-14 DIAGNOSIS — F329 Major depressive disorder, single episode, unspecified: Secondary | ICD-10-CM | POA: Diagnosis not present

## 2013-06-14 DIAGNOSIS — M79609 Pain in unspecified limb: Secondary | ICD-10-CM | POA: Diagnosis not present

## 2013-06-14 DIAGNOSIS — F3289 Other specified depressive episodes: Secondary | ICD-10-CM | POA: Diagnosis not present

## 2013-06-14 DIAGNOSIS — L0291 Cutaneous abscess, unspecified: Secondary | ICD-10-CM | POA: Diagnosis not present

## 2013-06-14 DIAGNOSIS — Z7901 Long term (current) use of anticoagulants: Secondary | ICD-10-CM | POA: Diagnosis not present

## 2013-06-14 DIAGNOSIS — I82409 Acute embolism and thrombosis of unspecified deep veins of unspecified lower extremity: Secondary | ICD-10-CM | POA: Diagnosis not present

## 2013-06-14 DIAGNOSIS — E119 Type 2 diabetes mellitus without complications: Secondary | ICD-10-CM | POA: Diagnosis not present

## 2013-06-15 DIAGNOSIS — M79606 Pain in leg, unspecified: Secondary | ICD-10-CM | POA: Insufficient documentation

## 2013-06-17 DIAGNOSIS — N183 Chronic kidney disease, stage 3 unspecified: Secondary | ICD-10-CM | POA: Insufficient documentation

## 2013-06-17 DIAGNOSIS — E1121 Type 2 diabetes mellitus with diabetic nephropathy: Secondary | ICD-10-CM | POA: Insufficient documentation

## 2013-06-19 DIAGNOSIS — M79609 Pain in unspecified limb: Secondary | ICD-10-CM | POA: Diagnosis not present

## 2013-06-19 DIAGNOSIS — Z86718 Personal history of other venous thrombosis and embolism: Secondary | ICD-10-CM | POA: Diagnosis not present

## 2013-06-19 DIAGNOSIS — E119 Type 2 diabetes mellitus without complications: Secondary | ICD-10-CM | POA: Diagnosis not present

## 2013-06-19 DIAGNOSIS — I10 Essential (primary) hypertension: Secondary | ICD-10-CM | POA: Diagnosis not present

## 2013-06-19 DIAGNOSIS — E785 Hyperlipidemia, unspecified: Secondary | ICD-10-CM | POA: Diagnosis not present

## 2013-06-23 DIAGNOSIS — R209 Unspecified disturbances of skin sensation: Secondary | ICD-10-CM | POA: Diagnosis not present

## 2013-06-23 DIAGNOSIS — M79609 Pain in unspecified limb: Secondary | ICD-10-CM | POA: Diagnosis not present

## 2013-06-23 DIAGNOSIS — M7989 Other specified soft tissue disorders: Secondary | ICD-10-CM | POA: Diagnosis not present

## 2013-06-23 DIAGNOSIS — I82409 Acute embolism and thrombosis of unspecified deep veins of unspecified lower extremity: Secondary | ICD-10-CM | POA: Diagnosis not present

## 2013-06-23 DIAGNOSIS — N058 Unspecified nephritic syndrome with other morphologic changes: Secondary | ICD-10-CM | POA: Diagnosis not present

## 2013-06-23 DIAGNOSIS — E1129 Type 2 diabetes mellitus with other diabetic kidney complication: Secondary | ICD-10-CM | POA: Diagnosis not present

## 2013-06-23 DIAGNOSIS — Z7901 Long term (current) use of anticoagulants: Secondary | ICD-10-CM | POA: Diagnosis not present

## 2013-06-23 DIAGNOSIS — Z86718 Personal history of other venous thrombosis and embolism: Secondary | ICD-10-CM | POA: Diagnosis not present

## 2013-06-23 DIAGNOSIS — N183 Chronic kidney disease, stage 3 unspecified: Secondary | ICD-10-CM | POA: Diagnosis not present

## 2013-06-27 DIAGNOSIS — K8020 Calculus of gallbladder without cholecystitis without obstruction: Secondary | ICD-10-CM | POA: Diagnosis not present

## 2013-06-27 DIAGNOSIS — M79609 Pain in unspecified limb: Secondary | ICD-10-CM | POA: Diagnosis not present

## 2013-06-27 DIAGNOSIS — I70209 Unspecified atherosclerosis of native arteries of extremities, unspecified extremity: Secondary | ICD-10-CM | POA: Diagnosis not present

## 2013-06-27 DIAGNOSIS — M7989 Other specified soft tissue disorders: Secondary | ICD-10-CM | POA: Diagnosis not present

## 2013-06-27 DIAGNOSIS — I708 Atherosclerosis of other arteries: Secondary | ICD-10-CM | POA: Diagnosis not present

## 2013-06-27 DIAGNOSIS — I771 Stricture of artery: Secondary | ICD-10-CM | POA: Diagnosis not present

## 2013-06-27 DIAGNOSIS — Z86718 Personal history of other venous thrombosis and embolism: Secondary | ICD-10-CM | POA: Diagnosis not present

## 2013-06-27 DIAGNOSIS — M7730 Calcaneal spur, unspecified foot: Secondary | ICD-10-CM | POA: Diagnosis not present

## 2013-06-27 DIAGNOSIS — I700 Atherosclerosis of aorta: Secondary | ICD-10-CM | POA: Diagnosis not present

## 2013-06-28 DIAGNOSIS — N183 Chronic kidney disease, stage 3 unspecified: Secondary | ICD-10-CM | POA: Diagnosis not present

## 2013-06-30 DIAGNOSIS — E875 Hyperkalemia: Secondary | ICD-10-CM | POA: Diagnosis not present

## 2013-06-30 DIAGNOSIS — N183 Chronic kidney disease, stage 3 unspecified: Secondary | ICD-10-CM | POA: Diagnosis not present

## 2013-06-30 DIAGNOSIS — I739 Peripheral vascular disease, unspecified: Secondary | ICD-10-CM | POA: Diagnosis not present

## 2013-07-10 DIAGNOSIS — Z7901 Long term (current) use of anticoagulants: Secondary | ICD-10-CM | POA: Diagnosis not present

## 2013-07-10 DIAGNOSIS — I82409 Acute embolism and thrombosis of unspecified deep veins of unspecified lower extremity: Secondary | ICD-10-CM | POA: Diagnosis not present

## 2013-07-16 DIAGNOSIS — Z794 Long term (current) use of insulin: Secondary | ICD-10-CM | POA: Diagnosis not present

## 2013-07-16 DIAGNOSIS — R5383 Other fatigue: Secondary | ICD-10-CM | POA: Diagnosis not present

## 2013-07-16 DIAGNOSIS — R209 Unspecified disturbances of skin sensation: Secondary | ICD-10-CM | POA: Diagnosis not present

## 2013-07-16 DIAGNOSIS — R5381 Other malaise: Secondary | ICD-10-CM | POA: Diagnosis not present

## 2013-07-16 DIAGNOSIS — J449 Chronic obstructive pulmonary disease, unspecified: Secondary | ICD-10-CM | POA: Diagnosis not present

## 2013-07-16 DIAGNOSIS — E119 Type 2 diabetes mellitus without complications: Secondary | ICD-10-CM | POA: Diagnosis not present

## 2013-07-16 DIAGNOSIS — Z87891 Personal history of nicotine dependence: Secondary | ICD-10-CM | POA: Diagnosis not present

## 2013-07-16 DIAGNOSIS — Z79899 Other long term (current) drug therapy: Secondary | ICD-10-CM | POA: Diagnosis not present

## 2013-07-16 DIAGNOSIS — Z7901 Long term (current) use of anticoagulants: Secondary | ICD-10-CM | POA: Diagnosis not present

## 2013-07-16 DIAGNOSIS — I10 Essential (primary) hypertension: Secondary | ICD-10-CM | POA: Diagnosis not present

## 2013-07-16 DIAGNOSIS — I872 Venous insufficiency (chronic) (peripheral): Secondary | ICD-10-CM | POA: Diagnosis not present

## 2013-07-21 ENCOUNTER — Encounter (HOSPITAL_COMMUNITY): Payer: Self-pay | Admitting: Emergency Medicine

## 2013-07-21 ENCOUNTER — Inpatient Hospital Stay (HOSPITAL_COMMUNITY)
Admission: EM | Admit: 2013-07-21 | Discharge: 2013-07-27 | DRG: 253 | Disposition: A | Discharge status: Home / Self Care | Payer: Medicare Other | Attending: Vascular Surgery | Admitting: Vascular Surgery

## 2013-07-21 DIAGNOSIS — E119 Type 2 diabetes mellitus without complications: Secondary | ICD-10-CM | POA: Diagnosis present

## 2013-07-21 DIAGNOSIS — E669 Obesity, unspecified: Secondary | ICD-10-CM | POA: Diagnosis present

## 2013-07-21 DIAGNOSIS — E46 Unspecified protein-calorie malnutrition: Secondary | ICD-10-CM | POA: Diagnosis present

## 2013-07-21 DIAGNOSIS — N189 Chronic kidney disease, unspecified: Secondary | ICD-10-CM | POA: Diagnosis present

## 2013-07-21 DIAGNOSIS — I129 Hypertensive chronic kidney disease with stage 1 through stage 4 chronic kidney disease, or unspecified chronic kidney disease: Secondary | ICD-10-CM | POA: Diagnosis present

## 2013-07-21 DIAGNOSIS — D649 Anemia, unspecified: Secondary | ICD-10-CM | POA: Diagnosis present

## 2013-07-21 DIAGNOSIS — I259 Chronic ischemic heart disease, unspecified: Secondary | ICD-10-CM | POA: Diagnosis not present

## 2013-07-21 DIAGNOSIS — L97909 Non-pressure chronic ulcer of unspecified part of unspecified lower leg with unspecified severity: Secondary | ICD-10-CM | POA: Diagnosis not present

## 2013-07-21 DIAGNOSIS — I739 Peripheral vascular disease, unspecified: Secondary | ICD-10-CM

## 2013-07-21 DIAGNOSIS — Z7901 Long term (current) use of anticoagulants: Secondary | ICD-10-CM | POA: Diagnosis not present

## 2013-07-21 DIAGNOSIS — I708 Atherosclerosis of other arteries: Principal | ICD-10-CM | POA: Diagnosis present

## 2013-07-21 DIAGNOSIS — E875 Hyperkalemia: Secondary | ICD-10-CM | POA: Diagnosis present

## 2013-07-21 DIAGNOSIS — Z794 Long term (current) use of insulin: Secondary | ICD-10-CM

## 2013-07-21 DIAGNOSIS — M79609 Pain in unspecified limb: Secondary | ICD-10-CM | POA: Diagnosis not present

## 2013-07-21 DIAGNOSIS — Z01818 Encounter for other preprocedural examination: Secondary | ICD-10-CM | POA: Diagnosis not present

## 2013-07-21 DIAGNOSIS — Z79899 Other long term (current) drug therapy: Secondary | ICD-10-CM

## 2013-07-21 DIAGNOSIS — G4733 Obstructive sleep apnea (adult) (pediatric): Secondary | ICD-10-CM | POA: Diagnosis present

## 2013-07-21 DIAGNOSIS — I714 Abdominal aortic aneurysm, without rupture, unspecified: Secondary | ICD-10-CM | POA: Diagnosis present

## 2013-07-21 DIAGNOSIS — M7989 Other specified soft tissue disorders: Secondary | ICD-10-CM | POA: Diagnosis not present

## 2013-07-21 DIAGNOSIS — M79673 Pain in unspecified foot: Secondary | ICD-10-CM

## 2013-07-21 DIAGNOSIS — N289 Disorder of kidney and ureter, unspecified: Secondary | ICD-10-CM | POA: Diagnosis present

## 2013-07-21 DIAGNOSIS — R9439 Abnormal result of other cardiovascular function study: Secondary | ICD-10-CM | POA: Diagnosis not present

## 2013-07-21 DIAGNOSIS — Z01810 Encounter for preprocedural cardiovascular examination: Secondary | ICD-10-CM | POA: Diagnosis not present

## 2013-07-21 DIAGNOSIS — N179 Acute kidney failure, unspecified: Secondary | ICD-10-CM | POA: Diagnosis not present

## 2013-07-21 DIAGNOSIS — I2510 Atherosclerotic heart disease of native coronary artery without angina pectoris: Secondary | ICD-10-CM | POA: Diagnosis present

## 2013-07-21 DIAGNOSIS — I6529 Occlusion and stenosis of unspecified carotid artery: Secondary | ICD-10-CM | POA: Diagnosis not present

## 2013-07-21 DIAGNOSIS — L98499 Non-pressure chronic ulcer of skin of other sites with unspecified severity: Secondary | ICD-10-CM

## 2013-07-21 DIAGNOSIS — Z86718 Personal history of other venous thrombosis and embolism: Secondary | ICD-10-CM | POA: Diagnosis not present

## 2013-07-21 DIAGNOSIS — I10 Essential (primary) hypertension: Secondary | ICD-10-CM | POA: Diagnosis not present

## 2013-07-21 HISTORY — DX: Disorder of kidney and ureter, unspecified: N28.9

## 2013-07-21 HISTORY — DX: Unspecified osteoarthritis, unspecified site: M19.90

## 2013-07-21 HISTORY — DX: Acute embolism and thrombosis of unspecified deep veins of unspecified lower extremity: I82.409

## 2013-07-21 HISTORY — DX: Type 2 diabetes mellitus without complications: E11.9

## 2013-07-21 HISTORY — DX: Essential (primary) hypertension: I10

## 2013-07-21 HISTORY — DX: Calculus of gallbladder without cholecystitis without obstruction: K80.20

## 2013-07-21 LAB — BASIC METABOLIC PANEL
BUN: 35 mg/dL — ABNORMAL HIGH (ref 6–23)
CO2: 22 mEq/L (ref 19–32)
Calcium: 8.6 mg/dL (ref 8.4–10.5)
Chloride: 106 mEq/L (ref 96–112)
Creatinine, Ser: 1.3 mg/dL — ABNORMAL HIGH (ref 0.50–1.10)
GFR calc Af Amer: 46 mL/min — ABNORMAL LOW (ref >90)
GFR, EST NON AFRICAN AMERICAN: 40 mL/min — AB (ref >90)
Glucose, Bld: 151 mg/dL — ABNORMAL HIGH (ref 70–99)
POTASSIUM: 4.8 meq/L (ref 3.7–5.3)
SODIUM: 141 meq/L (ref 137–147)

## 2013-07-21 LAB — CBC
HCT: 36.7 % (ref 36.0–46.0)
Hemoglobin: 11.9 g/dL — ABNORMAL LOW (ref 12.0–15.0)
MCH: 30.1 pg (ref 26.0–34.0)
MCHC: 32.4 g/dL (ref 30.0–36.0)
MCV: 92.7 fL (ref 78.0–100.0)
PLATELETS: 183 10*3/uL (ref 150–400)
RBC: 3.96 MIL/uL (ref 3.87–5.11)
RDW: 15.1 % (ref 11.5–15.5)
WBC: 7.8 10*3/uL (ref 4.0–10.5)

## 2013-07-21 LAB — PROTIME-INR
INR: 3.03 — AB (ref 0.00–1.49)
PROTHROMBIN TIME: 30.3 s — AB (ref 11.6–15.2)

## 2013-07-21 MED ORDER — GABAPENTIN 100 MG PO CAPS: 100.0000 mg | ORAL_CAPSULE | Freq: Two times a day (BID) | ORAL | Status: DC

## 2013-07-21 MED ORDER — ALBUTEROL SULFATE (2.5 MG/3ML) 0.083% IN NEBU: 2.5000 mg | INHALATION_SOLUTION | Freq: Four times a day (QID) | RESPIRATORY_TRACT | Status: DC | PRN

## 2013-07-21 MED ORDER — TIOTROPIUM BROMIDE MONOHYDRATE 18 MCG IN CAPS
18.0000 ug | ORAL_CAPSULE | Freq: Every day | RESPIRATORY_TRACT | Status: DC
  Administered 2013-07-22 – 2013-07-27 (×5): 18 ug via RESPIRATORY_TRACT
  Filled 2013-07-21 (×2): qty 5

## 2013-07-21 MED ORDER — ONDANSETRON HCL 4 MG/2ML IJ SOLN
4.0000 mg | Freq: Once | INTRAMUSCULAR | Status: AC
  Administered 2013-07-21: 4 mg via INTRAVENOUS
  Filled 2013-07-21: qty 2

## 2013-07-21 MED ORDER — GABAPENTIN 100 MG PO CAPS
100.0000 mg | ORAL_CAPSULE | Freq: Every day | ORAL | Status: DC
  Administered 2013-07-22 – 2013-07-27 (×5): 100 mg via ORAL
  Filled 2013-07-21 (×6): qty 1

## 2013-07-21 MED ORDER — CARVEDILOL 25 MG PO TABS
25.0000 mg | ORAL_TABLET | Freq: Two times a day (BID) | ORAL | Status: DC
  Administered 2013-07-22 – 2013-07-27 (×10): 25 mg via ORAL
  Filled 2013-07-21 (×14): qty 1

## 2013-07-21 MED ORDER — ALBUTEROL SULFATE HFA 108 (90 BASE) MCG/ACT IN AERS: 2.0000 | INHALATION_SPRAY | Freq: Four times a day (QID) | RESPIRATORY_TRACT | Status: DC | PRN

## 2013-07-21 MED ORDER — ESCITALOPRAM OXALATE 20 MG PO TABS
20.0000 mg | ORAL_TABLET | Freq: Every day | ORAL | Status: DC
  Administered 2013-07-22 – 2013-07-27 (×5): 20 mg via ORAL
  Filled 2013-07-21 (×6): qty 1

## 2013-07-21 MED ORDER — AMLODIPINE BESYLATE 10 MG PO TABS
10.0000 mg | ORAL_TABLET | Freq: Every day | ORAL | Status: DC
  Administered 2013-07-22 – 2013-07-27 (×5): 10 mg via ORAL
  Filled 2013-07-21 (×6): qty 1

## 2013-07-21 MED ORDER — HYDROMORPHONE HCL PF 1 MG/ML IJ SOLN
0.5000 mg | Freq: Once | INTRAMUSCULAR | Status: AC
  Administered 2013-07-21: 0.5 mg via INTRAVENOUS
  Filled 2013-07-21: qty 1

## 2013-07-21 MED ORDER — INSULIN GLARGINE 100 UNIT/ML ~~LOC~~ SOLN
13.0000 [IU] | Freq: Every day | SUBCUTANEOUS | Status: DC
  Administered 2013-07-22 – 2013-07-26 (×6): 13 [IU] via SUBCUTANEOUS
  Filled 2013-07-21 (×7): qty 0.13

## 2013-07-21 MED ORDER — CALCIUM CARBONATE-VITAMIN D 500-200 MG-UNIT PO TABS
1.0000 | ORAL_TABLET | Freq: Two times a day (BID) | ORAL | Status: DC
  Administered 2013-07-22 – 2013-07-27 (×10): 1 via ORAL
  Filled 2013-07-21 (×14): qty 1

## 2013-07-21 MED ORDER — GLIPIZIDE ER 10 MG PO TB24
10.0000 mg | ORAL_TABLET | Freq: Every day | ORAL | Status: DC
  Administered 2013-07-22 – 2013-07-26 (×4): 10 mg via ORAL
  Filled 2013-07-21 (×7): qty 1

## 2013-07-21 MED ORDER — CHLORTHALIDONE 50 MG PO TABS
50.0000 mg | ORAL_TABLET | Freq: Every day | ORAL | Status: DC
  Administered 2013-07-22 – 2013-07-27 (×5): 50 mg via ORAL
  Filled 2013-07-21 (×7): qty 1

## 2013-07-21 MED ORDER — GABAPENTIN 100 MG PO CAPS
200.0000 mg | ORAL_CAPSULE | Freq: Every day | ORAL | Status: DC
  Administered 2013-07-22 – 2013-07-26 (×6): 200 mg via ORAL
  Filled 2013-07-21 (×7): qty 2

## 2013-07-21 MED ORDER — INSULIN ASPART 100 UNIT/ML ~~LOC~~ SOLN
0.0000 [IU] | Freq: Three times a day (TID) | SUBCUTANEOUS | Status: DC
  Administered 2013-07-22: 3 [IU] via SUBCUTANEOUS
  Administered 2013-07-22: 4 [IU] via SUBCUTANEOUS
  Administered 2013-07-23: 3 [IU] via SUBCUTANEOUS

## 2013-07-21 MED ORDER — PIOGLITAZONE HCL 45 MG PO TABS
45.0000 mg | ORAL_TABLET | Freq: Every day | ORAL | Status: DC
  Administered 2013-07-22 – 2013-07-26 (×4): 45 mg via ORAL
  Filled 2013-07-21 (×7): qty 1

## 2013-07-21 NOTE — ED Notes (Signed)
Consulting provider has been contacted and will come in to evaluate pt.

## 2013-07-21 NOTE — ED Notes (Addendum)
Pt has been followed by [facility] for chroinc venous insufficiency for past few months. Her daughter brought her to ED today because "things are getting worse."  Pt has had more pain, redness and swelling this week, she is unable to stand due to pain. She took a vicodin just before she came here.

## 2013-07-21 NOTE — ED Notes (Signed)
Dr. Fields at bedside 

## 2013-07-21 NOTE — ED Notes (Signed)
Pharmacy technician called to place pt meds in [place] per nursing communication order.

## 2013-07-21 NOTE — H&P (Signed)
[facility] [place] HISTORY AND PHYSICAL   History of Present Illness:  Patient is a 73 y.o. year old female who presents for evaluation of left foot pain with non healing wounds on left foot.  She was seen by her primary MD for this problem approximately 6 weeks ago.  Approximately 30 pages of medical records and imaging from her prior visits there.  A venous duplex was obtained which showed no DVT.  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  She had ABI which were 0.2 on the left and 0.4 on the right.  The patient was waiting on an appt at [facility] in May but got tired of waiting and came to the ER at [facility] tonight due to worsening left foot pain.  She has primarily gotten around with a motorized wheelchair but up to 6 weeks ago she was able to get around the house with a walker fairly independently.  She has not been able to ambulate independently for the last 6 weeks.  She developed 2 small ulcers on the left toes about 6 months ago.  She has developed redness of the foot and and new dorsal foot wound over the last 6 weeks.  Other medical problems include diabetes, hypertension, renal dysfunction, PAD with prior left CEA at [facility] years ago, DVT 3 years ago and has been on coumadin since that time.  All of these problems are currently stable.  Past Medical History  Diagnosis Date  . Diabetes mellitus without complication   . Hypertension   . Renal disorder   . Gallstone   . Arthritis   . DVT (deep venous thrombosis)   Sleep apnea  Past Surgical History  Procedure Laterality Date  . Tubal ligation    . Carpal tunnel release    Right CEA  Social History History  Substance Use Topics  . Smoking status: Never Smoker   . Smokeless tobacco: Not on file  . Alcohol Use: No  Former smoker quit 25 years ago  Family History  Father and mother with coronary disease and strokes age  55-60 but both were smokers  Allergies  Allergies  Allergen Reactions  . Codeine Nausea Only  . Morphine And Related Nausea And Vomiting     No current facility-administered medications for this encounter.   Current Outpatient Prescriptions  Medication Sig Dispense Refill  . acetaminophen (TYLENOL) 500 MG tablet Take 500 mg by mouth every 6 (six) hours as needed.      . Aclidinium Bromide (TUDORZA PRESSAIR) 400 MCG/ACT AEPB Inhale 1 puff into the lungs every 12 (twelve) hours.      Marland Kitchen albuterol (PROVENTIL HFA;VENTOLIN HFA) 108 (90 BASE) MCG/ACT inhaler Inhale 2 puffs into the lungs every 6 (six) hours as needed for wheezing or shortness of breath.      Marland Kitchen amLODipine (NORVASC) 10 MG tablet Take 10 mg by mouth daily.      . calcium-vitamin D (OSCAL) 250-125 MG-UNIT per tablet Take 1 tablet by mouth 2 (two) times daily.      . carvedilol (COREG) 25 MG tablet Take 25 mg by mouth 2 (two) times daily with a meal.      . chlorthalidone (HYGROTON) 50 MG tablet Take 50 mg by mouth daily.      Marland Kitchen escitalopram (LEXAPRO) 20 MG tablet Take 20 mg by mouth daily.      Marland Kitchen gabapentin (NEURONTIN) 100 MG capsule Take  100 mg by mouth 2 (two) times daily. 100 mg in the morning and 200 mg at bedtime      . glipiZIDE (GLUCOTROL XL) 5 MG 24 hr tablet Take 10 mg by mouth daily with breakfast.      . HYDROcodone-acetaminophen (NORCO) 10-325 MG per tablet Take 1 tablet by mouth every 6 (six) hours as needed.      . insulin glargine (LANTUS) 100 UNIT/ML injection Inject 13 Units into the skin at bedtime.      . pioglitazone (ACTOS) 45 MG tablet Take 45 mg by mouth daily.      Marland Kitchen warfarin (COUMADIN) 5 MG tablet Take 5 mg by mouth daily. Pt takes 5 mg Tuesday Thursday Saturday Sunday.      . warfarin (COUMADIN) 6 MG tablet Take 6 mg by mouth daily. On Monday Wednesday Friday patient takes 6 mg      CPAP  ROS:   General:  No weight loss, Fever, chills  HEENT: No recent headaches, no nasal bleeding, no visual  changes, no sore throat  Neurologic: No dizziness, blackouts, seizures. No recent symptoms of stroke or mini- stroke. No recent episodes of slurred speech, or temporary blindness.  Cardiac: No recent episodes of chest pain/pressure, no shortness of breath at rest.  + shortness of breath with exertion.  Denies history of atrial fibrillation or irregular heartbeat  Vascular: + history of rest pain in feet.  No history of claudication.  + history of non-healing ulcer, + history of DVT   Pulmonary: No home oxygen but is on CPAP for presumed sleep apnea, no productive cough, no hemoptysis,  No asthma or wheezing  Musculoskeletal:  [ x] Arthritis, [ ]  Low back pain,  [x ] Joint pain  Hematologic:No history of hypercoagulable state.  No history of easy bleeding.  No history of anemia  Gastrointestinal: No hematochezia or melena,  No gastroesophageal reflux, no trouble swallowing  Urinary: [x ] chronic Kidney disease, [ ]  on HD - [ ]  MWF or [ ]  TTHS, [ ]  Burning with urination, [ ]  Frequent urination, [ ]  Difficulty urinating;   Skin: No rashes  Psychological: No history of anxiety,  No history of depression   Physical Examination  Filed Vitals:   07/21/13 1810 07/21/13 2055 07/21/13 2127 07/21/13 2145  BP: 173/50 176/71 163/60 164/57  Pulse: 58 56 58 57  Temp: 97.9 F (36.6 C)     TempSrc: Oral     Resp: 19 18 18    Height: 5\' 2"  (1.575 m)     Weight: 200 lb (90.719 kg)     SpO2: 98% 96% 97% 93%    Body mass index is 36.57 kg/(m^2).  General:  Alert and oriented, no acute distress HEENT: Normal Neck: No JVD, right neck CEA scar Pulmonary: Clear to auscultation bilaterally Cardiac: Regular Rate and Rhythm Abdomen: Soft, non-tender, non-distended, no mass, no scars, obese Skin: No rash, petechial erythema dorsal left foot and right gaiter area, 2 1 mm ulcers dorsal toes right foot 3 cm dark ulcer dorsal left foot, rubor left foot Extremity Pulses:  2+ left radial,1+ right radial  brachial,absent femoral, dorsalis pedis, posterior tibial pulses bilaterally Musculoskeletal: No deformity trace edema  Neurologic: Upper and lower extremity motor 5/5 and symmetric  DATA:   CBC    Component Value Date/Time   WBC 7.8 07/21/2013 1816   RBC 3.96 07/21/2013 1816   HGB 11.9* 07/21/2013 1816   HCT 36.7 07/21/2013 1816   PLT 183 07/21/2013 1816  MCV 92.7 07/21/2013 1816   MCH 30.1 07/21/2013 1816   MCHC 32.4 07/21/2013 1816   RDW 15.1 07/21/2013 1816    BMET    Component Value Date/Time   NA 141 07/21/2013 1816   K 4.8 07/21/2013 1816   CL 106 07/21/2013 1816   CO2 22 07/21/2013 1816   GLUCOSE 151* 07/21/2013 1816   BUN 35* 07/21/2013 1816   CREATININE 1.30* 07/21/2013 1816   CALCIUM 8.6 07/21/2013 1816   GFRNONAA 40* 07/21/2013 1816   GFRAA 46* 07/21/2013 1816    INR 3.03   ASSESSMENT:  Severe ischemia bilateral feet chronic in nature.  Pt is at risk for bilateral limb loss with severe critical PAD bilaterally.  She is a marginal candidate for revascularization due to her overall deconditioned state.   PLAN:  1.  Admit for pain control  2. Venous duplex to rule out DVT, will stop coumadin now and transition to heparin  3. Repeat ABI since prior study is > 6weeks ago 4.  Will need cardiology eval and stress test for risk stratification  5.  Will order PFT to determine underlying pulmonary capacity to see if she will tolerate an operation.  6. Will plan lower extremity arteriogram +/- intervention when INR < 1.6  7. Diabetes/Hypertension continue current meds for now.  8.  Will check albumin as I am sure she has protein calorie malnutrition 9.  Prior history of CEA will repeat carotid duplex since this has not been done for several years and had some contralateral stenosis per pt family report   Lengthy discussion with family regarding the severity of disease and emphasis on potential limb loss.  I have also emphasized to patient that how aggressive we will be with improving her circulation will be  dependent on her active participation to improve her overall deconditioned state.  Ruta Hinds, MD [facility] [place] Office: [phone] Pager: [phone]

## 2013-07-21 NOTE — ED Provider Notes (Signed)
Patient not in room   7:33 PM   Shaune Pollack, MD 07/21/13 [phone]

## 2013-07-21 NOTE — ED Provider Notes (Signed)
CSN: [id number]     Arrival date & time 07/21/13  1734 History   First MD Initiated Contact with Patient 07/21/13 1844     Chief Complaint  Patient presents with  . Leg Pain   HPI  History provided by the patient and family. Patient is a 73 year old female with history of hypertension, diabetes, renal insufficiency, previous DVTs and significant PAD who presents with complaints of worsening bilateral feet pain. Patient reports having worsening pain in her feet especially the left foot with redness and swelling. Her daughter is also concerned for a sore to the top of the foot there has been turning black over the past 2 days. Patient has known PAD of the bilateral lower extremities. She had been followed by multiple specialists including specialist at [facility]. She was seen last month with evaluations including vascular CT scans and ultrasounds. She has significant occlusions of the iliac arteries as well as the femoral arteries. Recently she is trying to be referred to a vascular specialist locally to have more treatments done for her condition but does not have an appointment yet. She states that she and her family were told to come to the emergency department by the referral specialists so that they could have a vascular specialist evaluate him here. Patient has been using Vicodin for her pains and took one prior to arrival. She states her pain continues to be a out of 10 especially when walking putting pressure on it. She does use a "hover around" at home to help alleviate pain and mobility issues. She denies any other changes or complaints. Denies any fever, chills or sweats. She does have continued numbness and loss of sensation to the feet. No new change in numbness or weakness. No chest pain or shortness of breath symptoms.   Past Medical History  Diagnosis Date  . Diabetes mellitus without complication   . Hypertension   . Renal disorder   . Gallstone   . Arthritis   . DVT (deep  venous thrombosis)    Past Surgical History  Procedure Laterality Date  . Tubal ligation    . Carpal tunnel release     History reviewed. No pertinent family history. History  Substance Use Topics  . Smoking status: Never Smoker   . Smokeless tobacco: Not on file  . Alcohol Use: No   OB History   Grav Para Term Preterm Abortions TAB SAB Ect Mult Living                 Review of Systems  Constitutional: Negative for fever, chills and unexpected weight change.  Respiratory: Negative for cough and shortness of breath.   Cardiovascular: Negative for chest pain.  Gastrointestinal: Negative for nausea, vomiting and abdominal pain.  Neurological: Positive for numbness.  All other systems reviewed and are negative.     Allergies  Codeine and Morphine and related  Home Medications   Prior to Admission medications   Not on File   BP 173/50  Pulse 58  Temp(Src) 97.9 F (36.6 C) (Oral)  Resp 19  Ht 5\' 2"  (1.575 m)  Wt 200 lb (90.719 kg)  BMI 36.57 kg/m2  SpO2 98% Physical Exam  Nursing note and vitals reviewed. Constitutional: She is oriented to person, place, and time. She appears well-developed and well-nourished. No distress.  HENT:  Head: Normocephalic.  Cardiovascular: Normal rate and regular rhythm.   Murmur heard. Pulmonary/Chest: Effort normal and breath sounds normal. No respiratory distress. She has no wheezes.  She has no rales.  Abdominal: Soft.  Musculoskeletal: She exhibits edema and tenderness.  Erythema and swelling to left lower extremity. There is a wound to the dorsal lateral part of the foot some darkening in color. No bleeding or drainage. Pulses in the foot are not detectable by palpation. Pulses are present including dorsal pedal pulses by Doppler bilaterally  There is some mild swelling to the right foot. There is some redness and swelling to the dorsal parts of the first and second toe with small healing wounds. Similar weak pulses which are  detectable by Doppler.  Neurological: She is alert and oriented to person, place, and time.  Skin: Skin is warm and dry. No rash noted.  Psychiatric: She has a normal mood and affect. Her behavior is normal.                    ED Course  Procedures   COORDINATION OF CARE:  Nursing notes reviewed. Vital signs reviewed. Initial pt interview and examination performed.   Filed Vitals:   07/21/13 1810  BP: 173/50  Pulse: 58  Temp: 97.9 F (36.6 C)  TempSrc: Oral  Resp: 19  Height: 5\' 2"  (1.575 m)  Weight: 200 lb (90.719 kg)  SpO2: 98%    8:20PM- patient is seen and evaluated. Patient does not appear in acute distress. Pulses are present by Doppler. There is some dark coloration to the healing wound on the dorsal foot. This has changed over the past few days per the daughter. They were told by a Rip Harbour from the vascular office to come to the emergency department and that Dr. Oneida Alar was on call. Discussed work up plan with pt at bedside, which includes basic lab testing and she agrees.    Patient discussed with attending physician. Will consult vascular surgery to discuss the case.  9:20PM spoke with Dr. Oneida Alar with vascular.  He will come and see pt and evaluate.  Dr. Oneida Alar has seen pt and will plan to admit.   Results for orders placed during the hospital encounter of 07/21/13  CBC      Result Value Ref Range   WBC 7.8  4.0 - 10.5 K/uL   RBC 3.96  3.87 - 5.11 MIL/uL   Hemoglobin 11.9 (*) 12.0 - 15.0 g/dL   HCT 36.7  36.0 - 46.0 %   MCV 92.7  78.0 - 100.0 fL   MCH 30.1  26.0 - 34.0 pg   MCHC 32.4  30.0 - 36.0 g/dL   RDW 15.1  11.5 - 15.5 %   Platelets 183  150 - 400 K/uL  BASIC METABOLIC PANEL      Result Value Ref Range   Sodium 141  137 - 147 mEq/L   Potassium 4.8  3.7 - 5.3 mEq/L   Chloride 106  96 - 112 mEq/L   CO2 22  19 - 32 mEq/L   Glucose, Bld 151 (*) 70 - 99 mg/dL   BUN 35 (*) 6 - 23 mg/dL   Creatinine, Ser 1.30 (*) 0.50 - 1.10 mg/dL   Calcium  8.6  8.4 - 10.5 mg/dL   GFR calc non Af Amer 40 (*) >90 mL/min   GFR calc Af Amer 46 (*) >90 mL/min  PROTIME-INR      Result Value Ref Range   Prothrombin Time 30.3 (*) 11.6 - 15.2 seconds   INR 3.03 (*) 0.00 - 1.49     MDM   Final diagnoses:  PAD (peripheral artery  disease)  Foot pain        Martie Lee, PA-C 07/21/13 2246

## 2013-07-22 ENCOUNTER — Inpatient Hospital Stay (HOSPITAL_COMMUNITY): Payer: Medicare Other

## 2013-07-22 DIAGNOSIS — I739 Peripheral vascular disease, unspecified: Secondary | ICD-10-CM

## 2013-07-22 DIAGNOSIS — M79609 Pain in unspecified limb: Secondary | ICD-10-CM | POA: Diagnosis not present

## 2013-07-22 DIAGNOSIS — E46 Unspecified protein-calorie malnutrition: Secondary | ICD-10-CM | POA: Diagnosis not present

## 2013-07-22 DIAGNOSIS — Z01818 Encounter for other preprocedural examination: Secondary | ICD-10-CM | POA: Diagnosis not present

## 2013-07-22 DIAGNOSIS — E875 Hyperkalemia: Secondary | ICD-10-CM | POA: Diagnosis not present

## 2013-07-22 DIAGNOSIS — M7989 Other specified soft tissue disorders: Secondary | ICD-10-CM | POA: Diagnosis not present

## 2013-07-22 DIAGNOSIS — I6529 Occlusion and stenosis of unspecified carotid artery: Secondary | ICD-10-CM

## 2013-07-22 DIAGNOSIS — I708 Atherosclerosis of other arteries: Secondary | ICD-10-CM | POA: Diagnosis not present

## 2013-07-22 DIAGNOSIS — L97909 Non-pressure chronic ulcer of unspecified part of unspecified lower leg with unspecified severity: Secondary | ICD-10-CM | POA: Diagnosis not present

## 2013-07-22 LAB — CBC
HCT: 31.9 % — ABNORMAL LOW (ref 36.0–46.0)
Hemoglobin: 10.2 g/dL — ABNORMAL LOW (ref 12.0–15.0)
MCH: 29.7 pg (ref 26.0–34.0)
MCHC: 32 g/dL (ref 30.0–36.0)
MCV: 92.7 fL (ref 78.0–100.0)
Platelets: 153 10*3/uL (ref 150–400)
RBC: 3.44 MIL/uL — AB (ref 3.87–5.11)
RDW: 15.1 % (ref 11.5–15.5)
WBC: 7.4 10*3/uL (ref 4.0–10.5)

## 2013-07-22 LAB — URINE MICROSCOPIC-ADD ON

## 2013-07-22 LAB — URINALYSIS, ROUTINE W REFLEX MICROSCOPIC
BILIRUBIN URINE: NEGATIVE
Glucose, UA: 100 mg/dL — AB
KETONES UR: NEGATIVE mg/dL
LEUKOCYTES UA: NEGATIVE
NITRITE: NEGATIVE
PH: 5.5 (ref 5.0–8.0)
Protein, ur: >300 mg/dL — AB
SPECIFIC GRAVITY, URINE: 1.018 (ref 1.005–1.030)
Urobilinogen, UA: 0.2 mg/dL (ref 0.0–1.0)

## 2013-07-22 LAB — GLUCOSE, CAPILLARY
GLUCOSE-CAPILLARY: 107 mg/dL — AB (ref 70–99)
GLUCOSE-CAPILLARY: 155 mg/dL — AB (ref 70–99)
Glucose-Capillary: 120 mg/dL — ABNORMAL HIGH (ref 70–99)
Glucose-Capillary: 146 mg/dL — ABNORMAL HIGH (ref 70–99)
Glucose-Capillary: 123 mg/dL — ABNORMAL HIGH (ref 70–99)

## 2013-07-22 LAB — COMPREHENSIVE METABOLIC PANEL
ALK PHOS: 30 U/L — AB (ref 39–117)
ALT: 11 U/L (ref 0–35)
AST: 16 U/L (ref 0–37)
Albumin: 2.4 g/dL — ABNORMAL LOW (ref 3.5–5.2)
BUN: 35 mg/dL — ABNORMAL HIGH (ref 6–23)
CO2: 20 meq/L (ref 19–32)
Calcium: 8.1 mg/dL — ABNORMAL LOW (ref 8.4–10.5)
Chloride: 107 mEq/L (ref 96–112)
Creatinine, Ser: 1.26 mg/dL — ABNORMAL HIGH (ref 0.50–1.10)
GFR calc Af Amer: 48 mL/min — ABNORMAL LOW (ref >90)
GFR, EST NON AFRICAN AMERICAN: 41 mL/min — AB (ref >90)
GLUCOSE: 152 mg/dL — AB (ref 70–99)
POTASSIUM: 5.2 meq/L (ref 3.7–5.3)
SODIUM: 139 meq/L (ref 137–147)
Total Protein: 6.4 g/dL (ref 6.0–8.3)

## 2013-07-22 LAB — HEMOGLOBIN A1C
Hgb A1c MFr Bld: 7 % — ABNORMAL HIGH (ref <5.7)
Mean Plasma Glucose: 154 mg/dL — ABNORMAL HIGH (ref <117)

## 2013-07-22 LAB — PROTIME-INR
INR: 3.43 — AB (ref 0.00–1.49)
Prothrombin Time: 33.3 seconds — ABNORMAL HIGH (ref 11.6–15.2)

## 2013-07-22 MED ORDER — HYDRALAZINE HCL 20 MG/ML IJ SOLN: 10.0000 mg | INTRAMUSCULAR | Status: DC | PRN

## 2013-07-22 MED ORDER — PHENOL 1.4 % MT LIQD: 1.0000 | OROMUCOSAL | Status: DC | PRN

## 2013-07-22 MED ORDER — ACETAMINOPHEN 325 MG PO TABS: 325.0000 mg | ORAL_TABLET | ORAL | Status: DC | PRN

## 2013-07-22 MED ORDER — POTASSIUM CHLORIDE CRYS ER 20 MEQ PO TBCR
20.0000 meq | EXTENDED_RELEASE_TABLET | Freq: Once | ORAL | Status: DC
  Filled 2013-07-22 (×2): qty 2

## 2013-07-22 MED ORDER — DOCUSATE SODIUM 100 MG PO CAPS
100.0000 mg | ORAL_CAPSULE | Freq: Two times a day (BID) | ORAL | Status: DC
  Administered 2013-07-22 – 2013-07-24 (×6): 100 mg via ORAL
  Filled 2013-07-22 (×9): qty 1

## 2013-07-22 MED ORDER — SODIUM CHLORIDE 0.9 % IJ SOLN
3.0000 mL | Freq: Two times a day (BID) | INTRAMUSCULAR | Status: DC
  Administered 2013-07-22 – 2013-07-25 (×6): 3 mL via INTRAVENOUS

## 2013-07-22 MED ORDER — SODIUM CHLORIDE 0.9 % IV SOLN: 250.0000 mL | INTRAVENOUS | Status: DC | PRN

## 2013-07-22 MED ORDER — LABETALOL HCL 5 MG/ML IV SOLN
10.0000 mg | INTRAVENOUS | Status: DC | PRN
  Filled 2013-07-22: qty 4

## 2013-07-22 MED ORDER — ACETAMINOPHEN 650 MG RE SUPP: 325.0000 mg | RECTAL | Status: DC | PRN

## 2013-07-22 MED ORDER — SODIUM CHLORIDE 0.9 % IJ SOLN
3.0000 mL | INTRAMUSCULAR | Status: DC | PRN
  Administered 2013-07-24: 3 mL via INTRAVENOUS

## 2013-07-22 MED ORDER — ALUM & MAG HYDROXIDE-SIMETH 200-200-20 MG/5ML PO SUSP: 15.0000 mL | ORAL | Status: DC | PRN

## 2013-07-22 MED ORDER — HYDROMORPHONE HCL PF 1 MG/ML IJ SOLN
0.5000 mg | INTRAMUSCULAR | Status: DC | PRN
  Administered 2013-07-22 (×3): 0.5 mg via INTRAVENOUS
  Administered 2013-07-23: 1 mg via INTRAVENOUS
  Filled 2013-07-22 (×4): qty 1

## 2013-07-22 MED ORDER — ONDANSETRON HCL 4 MG/2ML IJ SOLN
4.0000 mg | Freq: Four times a day (QID) | INTRAMUSCULAR | Status: DC | PRN
  Administered 2013-07-22 (×2): 4 mg via INTRAVENOUS
  Filled 2013-07-22 (×2): qty 2

## 2013-07-22 MED ORDER — METOPROLOL TARTRATE 1 MG/ML IV SOLN: 2.0000 mg | INTRAVENOUS | Status: DC | PRN

## 2013-07-22 MED ORDER — GUAIFENESIN-DM 100-10 MG/5ML PO SYRP: 15.0000 mL | ORAL_SOLUTION | ORAL | Status: DC | PRN

## 2013-07-22 MED ORDER — OXYCODONE-ACETAMINOPHEN 5-325 MG PO TABS
1.0000 | ORAL_TABLET | ORAL | Status: DC | PRN
  Administered 2013-07-22 (×2): 1 via ORAL
  Administered 2013-07-23 – 2013-07-25 (×12): 2 via ORAL
  Filled 2013-07-22 (×9): qty 2
  Filled 2013-07-22: qty 1
  Filled 2013-07-22 (×3): qty 2

## 2013-07-22 MED ORDER — PANTOPRAZOLE SODIUM 40 MG PO TBEC
40.0000 mg | DELAYED_RELEASE_TABLET | Freq: Every day | ORAL | Status: DC
  Administered 2013-07-22 – 2013-07-24 (×3): 40 mg via ORAL
  Filled 2013-07-22 (×3): qty 1

## 2013-07-22 NOTE — Progress Notes (Signed)
VASCULAR LAB PRELIMINARY  ARTERIAL  ABI completed: Right ABI indicates mild reduction in arterial blood flow with abnormal waveforms.  Left ABI indicates moderate reduction in arterial blood flow with abnormal waveforms.    RIGHT    LEFT    PRESSURE WAVEFORM  PRESSURE WAVEFORM  BRACHIAL 180 T BRACHIAL 187 T  DP   DP    AT 177 M AT 142 DM  PT 73 M PT 86 DM  PER   PER    GREAT TOE  NA GREAT TOE  NA    RIGHT LEFT  ABI 0.95 0.76     Tallon Gertz R Areen Trautner, RVT 07/22/2013, 10:46 AM

## 2013-07-22 NOTE — Progress Notes (Addendum)
[facility] [place]  Subjective  - foot feels better   Objective 154/68 56 98.1 F (36.7 C) (Oral) 20 97%  Intake/Output Summary (Last 24 hours) at 07/22/13 6314 Last data filed at 07/22/13 0800  Gross per 24 hour  Intake    240 ml  Output      0 ml  Net    240 ml   Rubor both feet no change overnight  Assessment/Planning: 1.  Protein calorie malnutrition- Alb 2.5 will get nutrition consult 2.  Mild renal insufficiency will need to hydrate prior to angio, low GFR, casts and protein in urine 3. PAD work up in progress ABI, carotid duplex, venous duplex, chest xray today 4. Cardiology eval EKG pending 5. Coagulopathy Coumadin/INR- trending up should go down since coumadin now stopped.    Elam Dutch 07/22/2013 9:22 AM --  Laboratory Lab Results:  Recent Labs  07/21/13 1816 07/22/13 0426  WBC 7.8 7.4  HGB 11.9* 10.2*  HCT 36.7 31.9*  PLT 183 153   BMET  Recent Labs  07/21/13 1816 07/22/13 0426  NA 141 139  K 4.8 5.2  CL 106 107  CO2 22 20  GLUCOSE 151* 152*  BUN 35* 35*  CREATININE 1.30* 1.26*  CALCIUM 8.6 8.1*    COAG Lab Results  Component Value Date   INR 3.43* 07/22/2013   INR 3.03* 07/21/2013   No results found for this basename: PTT

## 2013-07-22 NOTE — Progress Notes (Signed)
PT Cancellation Note  Patient Details Name: Natalie Porter MRN: [id number] DOB: 1941/01/16   Cancelled Treatment:    Reason Eval/Treat Not Completed: Other (comment) (Order received.  Pt in vascular lab. PT to return as able.  )   Christianne Dolin 07/22/2013, 12:37 PM [facility] [phone] (pager)

## 2013-07-22 NOTE — ED Provider Notes (Signed)
Medical screening examination/treatment/procedure(s) were performed by non-physician practitioner and as supervising physician I was immediately available for consultation/collaboration.   Dot Lanes, MD 07/22/13 [phone]

## 2013-07-22 NOTE — Progress Notes (Signed)
VASCULAR LAB PRELIMINARY  PRELIMINARY  PRELIMINARY  PRELIMINARY  Carotid Dopplers completed.    Preliminary report:  1-39% ICA stenosis (highest end of range).  Vertebral artery flow is antegrade.  Iantha Fallen, RVT 07/22/2013, 11:22 AM

## 2013-07-22 NOTE — Progress Notes (Signed)
VASCULAR LAB PRELIMINARY  PRELIMINARY  PRELIMINARY  PRELIMINARY  Bilateral lower extremity venous Dopplers completed.    Preliminary report:  There is no DVT or SVT noted in the bilateral lower extremities.  Iantha Fallen, RVT 07/22/2013, 10:46 AM

## 2013-07-23 DIAGNOSIS — I739 Peripheral vascular disease, unspecified: Secondary | ICD-10-CM | POA: Diagnosis not present

## 2013-07-23 LAB — CBC
HEMATOCRIT: 32.4 % — AB (ref 36.0–46.0)
HEMOGLOBIN: 10.4 g/dL — AB (ref 12.0–15.0)
MCH: 29.6 pg (ref 26.0–34.0)
MCHC: 32.1 g/dL (ref 30.0–36.0)
MCV: 92.3 fL (ref 78.0–100.0)
Platelets: 161 10*3/uL (ref 150–400)
RBC: 3.51 MIL/uL — AB (ref 3.87–5.11)
RDW: 15.3 % (ref 11.5–15.5)
WBC: 7.6 10*3/uL (ref 4.0–10.5)

## 2013-07-23 LAB — PROTIME-INR
INR: 2.53 — ABNORMAL HIGH (ref 0.00–1.49)
INR: 1.77 — ABNORMAL HIGH (ref 0.00–1.49)
PROTHROMBIN TIME: 20.1 s — AB (ref 11.6–15.2)
Prothrombin Time: 26.4 seconds — ABNORMAL HIGH (ref 11.6–15.2)

## 2013-07-23 LAB — GLUCOSE, CAPILLARY
GLUCOSE-CAPILLARY: 81 mg/dL (ref 70–99)
GLUCOSE-CAPILLARY: 137 mg/dL — AB (ref 70–99)
GLUCOSE-CAPILLARY: 89 mg/dL (ref 70–99)
Glucose-Capillary: 114 mg/dL — ABNORMAL HIGH (ref 70–99)

## 2013-07-23 LAB — HEPARIN LEVEL (UNFRACTIONATED): Heparin Unfractionated: 0.13 IU/mL — ABNORMAL LOW (ref 0.30–0.70)

## 2013-07-23 MED ORDER — DEXTROSE 5 % IV SOLN
5.0000 mg | INTRAVENOUS | Status: AC
  Administered 2013-07-23: 5 mg via INTRAVENOUS
  Filled 2013-07-23: qty 0.5

## 2013-07-23 MED ORDER — HEPARIN (PORCINE) IN NACL 100-0.45 UNIT/ML-% IJ SOLN
1200.0000 [IU]/h | INTRAMUSCULAR | Status: DC
  Administered 2013-07-23: 1000 [IU]/h via INTRAVENOUS
  Administered 2013-07-24: 1200 [IU]/h via INTRAVENOUS
  Filled 2013-07-23 (×5): qty 250

## 2013-07-23 NOTE — Evaluation (Signed)
Physical Therapy Evaluation Patient Details Name: Natalie Porter MRN: [id number] DOB: 27-Jul-1940 Today's Date: 07/23/2013   History of Present Illness   Patient is a 73 y.o. year old female who presents for evaluation of left foot pain with non healing wounds on left foot.  Clinical Impression  Pt mobility limited by bilat LE pain. Awaiting for medical plan regarding L LE. Pt functioning currently at supervision level however would need 24/7 assist for safe d/c home. Pt will most likely need ST-SNF if surgery is required on L LE. Acute PT to monitor patient until medical plan decided.     Follow Up Recommendations SNF;Supervision/Assistance - 24 hour (vs HHPT and 24/7)    Equipment Recommendations  None recommended by PT    Recommendations for Other Services       Precautions / Restrictions Precautions Precautions: Fall Precaution Comments: ischemic bilat feet L >R Restrictions Weight Bearing Restrictions: No      Mobility  Bed Mobility Overal bed mobility: Modified Independent             General bed mobility comments: HOB elevated, definite use of bed rail and UEs  Transfers Overall transfer level: Needs assistance Equipment used: Rolling walker (2 wheeled) Transfers: Sit to/from Stand Sit to Stand: Supervision         General transfer comment: guarded/slow, v/c's for safe hand placement  Ambulation/Gait Ambulation/Gait assistance: Min guard Ambulation Distance (Feet): 15 Feet (x2 (to/from bathroom) Assistive device: Rolling walker (2 wheeled) Gait Pattern/deviations: Antalgic;Decreased stance time - left Gait velocity: slow   General Gait Details: limited by L LE pain. pt with good walker management however labored effort due to pain  Stairs            Wheelchair Mobility    Modified Rankin (Stroke Patients Only)       Balance Overall balance assessment: Needs assistance         Standing balance support: Bilateral upper extremity  supported Standing balance-Leahy Scale: Poor Standing balance comment: requires use of RW for safe standing                             Pertinent Vitals/Pain 7/10 L LE pain, increased to 8/10 s/p ambulation    Home Living Family/patient expects to be discharged to:: Private residence Living Arrangements: Children Available Help at Discharge: Family;Available PRN/intermittently Type of Home: House Home Access: Ramped entrance     Home Layout: One level Home Equipment: [place] - 4 wheels;Bedside commode;Shower seat;Hand held Hotel manager      Prior Function Level of Independence: Needs assistance   Gait / Transfers Assistance Needed: uses 4WW, limited to household distance due to pain  ADL's / Homemaking Assistance Needed: daughter does cooking and assist with bathing        Hand Dominance   Dominant Hand: Right    Extremity/Trunk Assessment   Upper Extremity Assessment: Overall WFL for tasks assessed           Lower Extremity Assessment: RLE deficits/detail;LLE deficits/detail RLE Deficits / Details: errythema, redness, increase in dependent position. able to move into full ROM t/o LLE Deficits / Details: ischemic, limtied WBing, gentle ROM due to pain  Cervical / Trunk Assessment: Normal  Communication   Communication: HOH  Cognition Arousal/Alertness: Awake/alert Behavior During Therapy: WFL for tasks assessed/performed Overall Cognitive Status: Within Functional Limits for tasks assessed  General Comments General comments (skin integrity, edema, etc.): pt supervision for transfer on/off commode and with hygiene    Exercises        Assessment/Plan    PT Assessment Patient needs continued PT services  PT Diagnosis Difficulty walking;Acute pain   PT Problem List Decreased strength;Decreased activity tolerance;Decreased balance;Pain;Decreased mobility  PT Treatment Interventions DME  instruction;Gait training;Functional mobility training;Therapeutic activities;Therapeutic exercise   PT Goals (Current goals can be found in the Care Plan section) Acute Rehab PT Goals Patient Stated Goal: make a decision about my leg PT Goal Formulation: With patient Time For Goal Achievement: 08/05/13 Potential to Achieve Goals: Good    Frequency Min 2X/week (until decision made regarding L foot)   Barriers to discharge Decreased caregiver support pt alone during the day, daughter has been coming in for breakfast and lunch    Co-evaluation               End of Session Equipment Utilized During Treatment: Gait belt Activity Tolerance: Patient limited by pain Patient left: in chair;with call bell/phone within reach Nurse Communication: Mobility status         Time: 1516-1540 PT Time Calculation (min): 24 min   Charges:   PT Evaluation $Initial PT Evaluation Tier I: 1 Procedure PT Treatments $Gait Training: 8-22 mins   PT G CodesKoleen Distance Narayan Scull 07/23/2013, 3:58 PM  Kittie Plater, PT, DPT Pager #: [phone] Office #: [phone]

## 2013-07-23 NOTE — Progress Notes (Signed)
Foot hurts except when she gets pain meds.    Filed Vitals:   07/22/13 0430 07/22/13 1356 07/22/13 2100 07/23/13 0416  BP: 154/68 152/68 130/46 166/64  Pulse: 56 60 62 72  Temp: 98.1 F (36.7 C) 98 F (36.7 C) 99 F (37.2 C) 100.1 F (37.8 C)  TempSrc: Oral Oral Oral Oral  Resp: 20 18 19 20   Height:      Weight:      SpO2: 97% 98% 91% 91%    Left and right foot cool dusky compared to yesterday  Data: No DVT bilaterally, Carotid duplex <40% bilaterally, ABI .9 right 0.7 left probably falsely elevated due to calcification   EKG right bundle branch  A: Bilateral chronic lower extremity severe PAD P: 1.  Cardiology consult for preop risk stratification tomorrow      2. PFT tomorrow      3. Gently IV hydrate tomorrow in anticipation of [place] Tuesday      4. Vitamin k today to reverse coumadin      5. Heparin drip when INR <1.6  Ruta Hinds, MD [facility] [place] Office: [phone] Pager: [phone]

## 2013-07-23 NOTE — Progress Notes (Signed)
Pt places self on/off machine.  Our machine, her mask and circuit.

## 2013-07-23 NOTE — Progress Notes (Signed)
ANTICOAGULATION CONSULT NOTE - Initial Consult  Pharmacy Consult for Heparin Indication: History of DVT  Allergies  Allergen Reactions  . Codeine Nausea Only  . Morphine And Related Nausea And Vomiting    Patient Measurements: Height: 5\' 2"  (157.5 cm) Weight: 194 lb 1.6 oz (88.043 kg) IBW/kg (Calculated) : 50.1 Heparin Dosing Weight: 70 kg  Vital Signs: Temp: 98.8 F (37.1 C) (05/03 1345) Temp src: Oral (05/03 1345) BP: 136/56 mmHg (05/03 1345) Pulse Rate: 56 (05/03 1345)  Labs:  Recent Labs  07/21/13 1816 07/22/13 0426 07/23/13 0515 07/23/13 1400  HGB 11.9* 10.2* 10.4*  --   HCT 36.7 31.9* 32.4*  --   PLT 183 153 161  --   LABPROT 30.3* 33.3* 26.4* 20.1*  INR 3.03* 3.43* 2.53* 1.77*  CREATININE 1.30* 1.26*  --   --     Estimated Creatinine Clearance: 41 ml/min (by C-G formula based on Cr of 1.26).   Medical History: Past Medical History  Diagnosis Date  . Diabetes mellitus without complication   . Hypertension   . Renal disorder   . Gallstone   . Arthritis   . DVT (deep venous thrombosis)     Assessment: 73 YOF on Coumadin PTA for hx of DVT (3 years ago), she has severe chronic bilateral ischemia feet, plan possible for LE arteriogram when INR < 1.6. LE doppler neg for DVT. Pharmacy is consulted to start heparin when INR < 2. INR 2.53 this AM, received vitamin K 5mg  IV to reverse, INR now dropped to 1.77. Anticipation of angiogram Tuesday. Hgb 11.4, plt 161 K   Goal of Therapy:  Heparin level 0.3-0.7 units/ml Monitor platelets by anticoagulation protocol: Yes   Plan:  - Start IV heparin without bolus 1000 units/hr - f/u 8 hr heparin level at 2300 - daily heparin level and cbc - f/u plans for angiogram  Maryanna Shape, PharmD, BCPS  Clinical Pharmacist  Pager: [phone]  07/23/2013,2:40 PM

## 2013-07-24 ENCOUNTER — Encounter (HOSPITAL_COMMUNITY): Payer: Medicare Other

## 2013-07-24 ENCOUNTER — Inpatient Hospital Stay (HOSPITAL_COMMUNITY): Payer: Medicare Other

## 2013-07-24 ENCOUNTER — Other Ambulatory Visit (HOSPITAL_COMMUNITY): Payer: Self-pay | Admitting: Respiratory Therapy

## 2013-07-24 DIAGNOSIS — I739 Peripheral vascular disease, unspecified: Secondary | ICD-10-CM | POA: Diagnosis not present

## 2013-07-24 DIAGNOSIS — R0602 Shortness of breath: Secondary | ICD-10-CM

## 2013-07-24 DIAGNOSIS — I708 Atherosclerosis of other arteries: Secondary | ICD-10-CM | POA: Diagnosis not present

## 2013-07-24 DIAGNOSIS — L97909 Non-pressure chronic ulcer of unspecified part of unspecified lower leg with unspecified severity: Secondary | ICD-10-CM | POA: Diagnosis not present

## 2013-07-24 DIAGNOSIS — E875 Hyperkalemia: Secondary | ICD-10-CM | POA: Diagnosis not present

## 2013-07-24 DIAGNOSIS — E46 Unspecified protein-calorie malnutrition: Secondary | ICD-10-CM | POA: Diagnosis not present

## 2013-07-24 LAB — GLUCOSE, CAPILLARY
GLUCOSE-CAPILLARY: 95 mg/dL (ref 70–99)
Glucose-Capillary: 85 mg/dL (ref 70–99)
Glucose-Capillary: 129 mg/dL — ABNORMAL HIGH (ref 70–99)
Glucose-Capillary: 80 mg/dL (ref 70–99)

## 2013-07-24 LAB — PROTIME-INR
INR: 1.17 (ref 0.00–1.49)
Prothrombin Time: 14.7 seconds (ref 11.6–15.2)

## 2013-07-24 LAB — BASIC METABOLIC PANEL
BUN: 49 mg/dL — AB (ref 6–23)
CO2: 21 mEq/L (ref 19–32)
CREATININE: 1.74 mg/dL — AB (ref 0.50–1.10)
Calcium: 8 mg/dL — ABNORMAL LOW (ref 8.4–10.5)
Chloride: 104 mEq/L (ref 96–112)
GFR calc Af Amer: 32 mL/min — ABNORMAL LOW (ref >90)
GFR, EST NON AFRICAN AMERICAN: 28 mL/min — AB (ref >90)
GLUCOSE: 74 mg/dL (ref 70–99)
POTASSIUM: 5.7 meq/L — AB (ref 3.7–5.3)
Sodium: 136 mEq/L — ABNORMAL LOW (ref 137–147)

## 2013-07-24 LAB — CBC
HEMATOCRIT: 30.7 % — AB (ref 36.0–46.0)
HEMOGLOBIN: 9.7 g/dL — AB (ref 12.0–15.0)
MCH: 29.5 pg (ref 26.0–34.0)
MCHC: 31.6 g/dL (ref 30.0–36.0)
MCV: 93.3 fL (ref 78.0–100.0)
Platelets: 152 10*3/uL (ref 150–400)
RBC: 3.29 MIL/uL — ABNORMAL LOW (ref 3.87–5.11)
RDW: 15.4 % (ref 11.5–15.5)
WBC: 7.9 10*3/uL (ref 4.0–10.5)

## 2013-07-24 LAB — HEPARIN LEVEL (UNFRACTIONATED): Heparin Unfractionated: 0.44 IU/mL (ref 0.30–0.70)

## 2013-07-24 MED ORDER — ALBUTEROL SULFATE (2.5 MG/3ML) 0.083% IN NEBU
2.5000 mg | INHALATION_SOLUTION | Freq: Once | RESPIRATORY_TRACT | Status: AC
  Administered 2013-07-24: 2.5 mg via RESPIRATORY_TRACT
  Filled 2013-07-24: qty 3

## 2013-07-24 MED ORDER — SODIUM CHLORIDE 0.45 % IV SOLN
INTRAVENOUS | Status: DC
  Administered 2013-07-24: 19:00:00 via INTRAVENOUS

## 2013-07-24 NOTE — Progress Notes (Signed)
Clinical Social Work Department CLINICAL SOCIAL WORK PLACEMENT NOTE 07/24/2013  Patient:  EVELEAN, BIGLER  Account Number:  [id number] Admit date:  07/21/2013  Clinical Social Worker:  Megan Salon  Date/time:  07/24/2013 10:46 AM  Clinical Social Work is seeking post-discharge placement for this patient at the following level of care:   [place]   (*CSW will update this form in Epic as items are completed)   07/24/2013  Patient/family provided with [place] Department of Clinical Social Work's list of facilities offering this level of care within the geographic area requested by the patient (or if unable, by the patient's family).  07/24/2013  Patient/family informed of their freedom to choose among providers that offer the needed level of care, that participate in Medicare, Medicaid or managed care program needed by the patient, have an available bed and are willing to accept the patient.  07/24/2013  Patient/family informed of [facility]' ownership interest in [facility], as well as of the fact that they are under no obligation to receive care at this facility.  PASARR submitted to EDS on 07/24/2013 PASARR number received from EDS on 07/24/2013  FL2 transmitted to all facilities in geographic area requested by pt/family on  07/24/2013 FL2 transmitted to all facilities within larger geographic area on   Patient informed that his/her managed care company has contracts with or will negotiate with  certain facilities, including the following:     Patient/family informed of bed offers received:   Patient chooses bed at  Physician recommends and patient chooses bed at    Patient to be transferred to  on   Patient to be transferred to facility by   The following physician request were entered in Epic:   Additional Comments:   Jeanette Caprice, MSW, [place]

## 2013-07-24 NOTE — Progress Notes (Signed)
Spoke with patient about CPAP. Patient states she can place herself on and off when ready. RT instructed to call if anything is needed.

## 2013-07-24 NOTE — Consult Note (Addendum)
Reason for Consult: Pre-Op Clearance Referring Physician:   Lashante Fryberger is an 73 y.o. female.  HPI:   The patient is a 73 yo female with a history of PAD with prior left CEA, OSA-CPAP, DM, HTN, renal disorder, gallstone, DVT, arthritis.  She presented with left foot pain and non-healing wounds. ABI's are 0.2 on the left and 0.4 on the right.  Cardiomegaly on CXR.  The patient reports developing foot problems with in the last six weeks.  She had LEA dopplers back in April in [place] with no apparent concerns.  She's had worsening LE pain and nonhealing ulcers particularly in the left leg.  The patient currently denies nausea, vomiting, fever, chest pain, shortness of breath, orthopnea, dizziness, PND, cough, congestion, abdominal pain, hematochezia, melena.    Past Medical History  Diagnosis Date  . Diabetes mellitus without complication   . Hypertension   . Renal disorder   . Gallstone   . Arthritis   . DVT (deep venous thrombosis)     Past Surgical History  Procedure Laterality Date  . Tubal ligation    . Carpal tunnel release      Family Hx:  Mother and father had CAD - both were smokers however   Social History:  reports that she has never smoked. She does not have any smokeless tobacco history on file. She reports that she does not drink alcohol or use illicit drugs.  Allergies:  Allergies  Allergen Reactions  . Codeine Nausea Only  . Morphine And Related Nausea And Vomiting    Medications:  Scheduled Meds: . amLODipine  10 mg Oral Daily  . calcium-vitamin D  1 tablet Oral BID WC  . carvedilol  25 mg Oral BID WC  . chlorthalidone  50 mg Oral Daily  . docusate sodium  100 mg Oral BID  . escitalopram  20 mg Oral Daily  . gabapentin  100 mg Oral Daily   And  . gabapentin  200 mg Oral QHS  . glipiZIDE  10 mg Oral Q breakfast  . insulin aspart  0-20 Units Subcutaneous TID WC  . insulin glargine  13 Units Subcutaneous QHS  . pantoprazole  40 mg Oral  Daily  . pioglitazone  45 mg Oral Q breakfast  . potassium chloride  20-40 mEq Oral Once  . sodium chloride  3 mL Intravenous Q12H  . tiotropium  18 mcg Inhalation Daily   Continuous Infusions: . heparin 1,200 Units/hr (07/24/13 1251)   PRN Meds:.sodium chloride, acetaminophen, acetaminophen, albuterol, alum & mag hydroxide-simeth, guaiFENesin-dextromethorphan, hydrALAZINE, HYDROmorphone (DILAUDID) injection, labetalol, metoprolol, ondansetron, oxyCODONE-acetaminophen, phenol, sodium chloride   Results for orders placed during the hospital encounter of 07/21/13 (from the past 48 hour(s))  GLUCOSE, CAPILLARY     Status: Abnormal   Collection Time    07/22/13  4:08 PM      Result Value Ref Range   Glucose-Capillary 146 (*) 70 - 99 mg/dL   Comment 1 Notify RN     Comment 2 Documented in Chart    GLUCOSE, CAPILLARY     Status: Abnormal   Collection Time    07/22/13  8:59 PM      Result Value Ref Range   Glucose-Capillary 123 (*) 70 - 99 mg/dL   Comment 1 Documented in Chart     Comment 2 Notify RN    PROTIME-INR     Status: Abnormal   Collection Time    07/23/13  5:15 AM  Result Value Ref Range   Prothrombin Time 26.4 (*) 11.6 - 15.2 seconds   INR 2.53 (*) 0.00 - 1.49  CBC     Status: Abnormal   Collection Time    07/23/13  5:15 AM      Result Value Ref Range   WBC 7.6  4.0 - 10.5 K/uL   RBC 3.51 (*) 3.87 - 5.11 MIL/uL   Hemoglobin 10.4 (*) 12.0 - 15.0 g/dL   HCT 32.4 (*) 36.0 - 46.0 %   MCV 92.3  78.0 - 100.0 fL   MCH 29.6  26.0 - 34.0 pg   MCHC 32.1  30.0 - 36.0 g/dL   RDW 15.3  11.5 - 15.5 %   Platelets 161  150 - 400 K/uL  GLUCOSE, CAPILLARY     Status: None   Collection Time    07/23/13  6:29 AM      Result Value Ref Range   Glucose-Capillary 81  70 - 99 mg/dL  GLUCOSE, CAPILLARY     Status: Abnormal   Collection Time    07/23/13 11:17 AM      Result Value Ref Range   Glucose-Capillary 137 (*) 70 - 99 mg/dL   Comment 1 Notify RN    PROTIME-INR     Status:  Abnormal   Collection Time    07/23/13  2:00 PM      Result Value Ref Range   Prothrombin Time 20.1 (*) 11.6 - 15.2 seconds   INR 1.77 (*) 0.00 - 1.49  GLUCOSE, CAPILLARY     Status: None   Collection Time    07/23/13  4:31 PM      Result Value Ref Range   Glucose-Capillary 89  70 - 99 mg/dL   Comment 1 Notify RN    GLUCOSE, CAPILLARY     Status: Abnormal   Collection Time    07/23/13  9:02 PM      Result Value Ref Range   Glucose-Capillary 114 (*) 70 - 99 mg/dL   Comment 1 Documented in Chart     Comment 2 Notify RN    HEPARIN LEVEL (UNFRACTIONATED)     Status: Abnormal   Collection Time    07/23/13 10:42 PM      Result Value Ref Range   Heparin Unfractionated 0.13 (*) 0.30 - 0.70 IU/mL   Comment:            IF HEPARIN RESULTS ARE BELOW     EXPECTED VALUES, AND PATIENT     DOSAGE HAS BEEN CONFIRMED,     SUGGEST FOLLOW UP TESTING     OF ANTITHROMBIN III LEVELS.  PROTIME-INR     Status: None   Collection Time    07/24/13  5:35 AM      Result Value Ref Range   Prothrombin Time 14.7  11.6 - 15.2 seconds   INR 1.17  0.00 - 1.49  CBC     Status: Abnormal   Collection Time    07/24/13  5:35 AM      Result Value Ref Range   WBC 7.9  4.0 - 10.5 K/uL   RBC 3.29 (*) 3.87 - 5.11 MIL/uL   Hemoglobin 9.7 (*) 12.0 - 15.0 g/dL   HCT 30.7 (*) 36.0 - 46.0 %   MCV 93.3  78.0 - 100.0 fL   MCH 29.5  26.0 - 34.0 pg   MCHC 31.6  30.0 - 36.0 g/dL   RDW 15.4  11.5 - 15.5 %  Platelets 152  150 - 400 K/uL  BASIC METABOLIC PANEL     Status: Abnormal   Collection Time    07/24/13  5:35 AM      Result Value Ref Range   Sodium 136 (*) 137 - 147 mEq/L   Potassium 5.7 (*) 3.7 - 5.3 mEq/L   Chloride 104  96 - 112 mEq/L   CO2 21  19 - 32 mEq/L   Glucose, Bld 74  70 - 99 mg/dL   BUN 49 (*) 6 - 23 mg/dL   Creatinine, Ser 8.76 (*) 0.50 - 1.10 mg/dL   Calcium 8.0 (*) 8.4 - 10.5 mg/dL   GFR calc non Af Amer 28 (*) >90 mL/min   GFR calc Af Amer 32 (*) >90 mL/min   Comment: (NOTE)     The  eGFR has been calculated using the CKD EPI equation.     This calculation has not been validated in all clinical situations.     eGFR's persistently <90 mL/min signify possible Chronic Kidney     Disease.  GLUCOSE, CAPILLARY     Status: None   Collection Time    07/24/13  6:03 AM      Result Value Ref Range   Glucose-Capillary 85  70 - 99 mg/dL   Comment 1 Documented in Chart     Comment 2 Notify RN    HEPARIN LEVEL (UNFRACTIONATED)     Status: None   Collection Time    07/24/13  7:50 AM      Result Value Ref Range   Heparin Unfractionated 0.44  0.30 - 0.70 IU/mL   Comment:            IF HEPARIN RESULTS ARE BELOW     EXPECTED VALUES, AND PATIENT     DOSAGE HAS BEEN CONFIRMED,     SUGGEST FOLLOW UP TESTING     OF ANTITHROMBIN III LEVELS.  GLUCOSE, CAPILLARY     Status: None   Collection Time    07/24/13 11:25 AM      Result Value Ref Range   Glucose-Capillary 95  70 - 99 mg/dL    No results found.  Review of Systems  Constitutional: Negative for fever.  HENT: Negative for congestion and sore throat.   Respiratory: Negative for cough, shortness of breath and wheezing.   Cardiovascular: Positive for leg swelling. Negative for chest pain, orthopnea and PND.  Gastrointestinal: Negative for nausea, vomiting, abdominal pain, blood in stool and melena.  Genitourinary: Negative for hematuria.  Musculoskeletal:       LE pain  Neurological: Negative for dizziness.  All other systems reviewed and are negative.  Blood pressure 133/64, pulse 54, temperature 99.6 F (37.6 C), temperature source Oral, resp. rate 19, height 5\' 2"  (1.575 m), weight 194 lb 1.6 oz (88.043 kg), SpO2 96.00%. Physical Exam  Nursing note and vitals reviewed. Constitutional: She is oriented to person, place, and time. She appears well-developed. No distress.  Obese  HENT:  Head: Normocephalic and atraumatic.  Eyes: EOM are normal. Pupils are equal, round, and reactive to light. No scleral icterus.  Neck:  Normal range of motion. Neck supple.  Cardiovascular: Normal rate, regular rhythm, S1 normal and S2 normal.   Murmur heard.  Systolic murmur is present with a grade of 2/6  Pulses:      Radial pulses are 2+ on the right side, and 2+ on the left side.       Dorsalis pedis pulses are 1+ on the  right side, and 2+ on the left side.       Posterior tibial pulses are 1+ on the right side, and 2+ on the left side.  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  Respiratory: Effort normal. She has wheezes (right side). She has no rales.  GI: Soft. Bowel sounds are normal. She exhibits no distension. There is no tenderness.  Musculoskeletal: She exhibits edema (Trace).  Lymphadenopathy:    She has no cervical adenopathy.  Neurological: She is alert and oriented to person, place, and time. She exhibits normal muscle tone.  Skin: Skin is warm and dry.  Mottle appearance of the left LE.  Necrotic appearing anterior tibial lesion ~2-3cm  Psychiatric: She has a normal mood and affect.    Assessment/Plan: Active Problems:   1.  PAD (peripheral artery disease)   History of left carotid endarterectomy and new low extremity disease.  Left ABI 0.2, right 0.4  Plan for LE arteriogram tomorrow per Dr. Oneida Alar.  Will need to get a Lexiscan myovue prior to surgery.       Pre Op Clearance  Cardiomegaly on CXR.  RBBB on EKG.  We will get a 2D echo and Lexiscan myovue(tomorrow).       Hyperkalemia  Continue to monitor- hold potassium    HTN  She is on amlodipine, coreg 25 bid,  Chlorthalidone.  BP ok.  Continue to monitor.    Acute on chronic kidney disease.  SCr has increased from 1.26 to 1.74.  Will need to consider hydration.  Will get echo done today.  Tarri Fuller 07/24/2013, 1:06 PM   Attending Note:   The patient was seen and examined.  Agree with assessment and plan as noted above.  Changes made to the above note as needed.  She has no angina but she's really  quite limited because of her severe peripheral vascular disease. We'll get a Le[place]ell scan Myoview study prior to the surgical clearance. If the study is a normal or low-risk then I think we can proceed with surgery. If the Myoview is high risk , then we will need to do further workup prior to  her vascular surgery.  Thayer Headings, Brooke Bonito., MD, [facility] 07/24/2013, 2:09 PM

## 2013-07-24 NOTE — Progress Notes (Signed)
ANTICOAGULATION CONSULT NOTE - Follow Up Consult  Pharmacy Consult for Heparin  Indication: History of DVT (warfarin on hold for procedure)  Allergies  Allergen Reactions  . Codeine Nausea Only  . Morphine And Related Nausea And Vomiting    Patient Measurements: Height: 5\' 2"  (157.5 cm) Weight: 194 lb 1.6 oz (88.043 kg) IBW/kg (Calculated) : 50.1  Vital Signs: Temp: 99.6 F (37.6 C) (05/04 0421) Temp src: Oral (05/04 0421) BP: 133/64 mmHg (05/04 0421) Pulse Rate: 54 (05/04 0421)  Labs:  Recent Labs  07/21/13 1816 07/22/13 0426 07/23/13 0515 07/23/13 1400 07/23/13 2242 07/24/13 0535 07/24/13 0750  HGB 11.9* 10.2* 10.4*  --   --  9.7*  --   HCT 36.7 31.9* 32.4*  --   --  30.7*  --   PLT 183 153 161  --   --  152  --   LABPROT 30.3* 33.3* 26.4* 20.1*  --  14.7  --   INR 3.03* 3.43* 2.53* 1.77*  --  1.17  --   HEPARINUNFRC  --   --   --   --  0.13*  --  0.44  CREATININE 1.30* 1.26*  --   --   --  1.74*  --     Estimated Creatinine Clearance: 29.7 ml/min (by C-G formula based on Cr of 1.74).  Assessment: 73 y/o F on Coumadin PTA for hx of DVT (3 yrs ago), LE doppler neg for DVT. INR down to 1.17 - s/p vit K 5mg  IV 5/3 1000. Anticipation of angiogram Tuesday. Pt now on heparin bridge. HL 0.44 on gtt at 1200 units/hr. Hgb trending down - will watch. plt ok. No bleeding noted.  Goal of Therapy:  Heparin level 0.3-0.7 units/ml Monitor platelets by anticoagulation protocol: Yes   Plan:  -Continue heparin 1200 units/hr -Daily CBC/HL -Monitor for bleeding  Sherlon Handing, PharmD, BCPS Clinical pharmacist, pager [phone] 07/24/2013,9:21 AM

## 2013-07-24 NOTE — Progress Notes (Signed)
ANTICOAGULATION CONSULT NOTE - Follow Up Consult  Pharmacy Consult for Heparin  Indication: History of DVT (warfarin on hold for procedure)  Allergies  Allergen Reactions  . Codeine Nausea Only  . Morphine And Related Nausea And Vomiting    Patient Measurements: Height: 5\' 2"  (157.5 cm) Weight: 194 lb 1.6 oz (88.043 kg) IBW/kg (Calculated) : 50.1  Vital Signs: Temp: 99.2 F (37.3 C) (05/03 2015) Temp src: Oral (05/03 2015) BP: 132/60 mmHg (05/03 2015) Pulse Rate: 66 (05/03 2015)  Labs:  Recent Labs  07/21/13 1816 07/22/13 0426 07/23/13 0515 07/23/13 1400 07/23/13 2242  HGB 11.9* 10.2* 10.4*  --   --   HCT 36.7 31.9* 32.4*  --   --   PLT 183 153 161  --   --   LABPROT 30.3* 33.3* 26.4* 20.1*  --   INR 3.03* 3.43* 2.53* 1.77*  --   HEPARINUNFRC  --   --   --   --  0.13*  CREATININE 1.30* 1.26*  --   --   --     Estimated Creatinine Clearance: 41 ml/min (by C-G formula based on Cr of 1.26).   Medications:  Heparin 1000 units/hr  Assessment: 73 y/o F with sub-therapeutic HL of 0.13. Warfarin PTA for h/o DVT on hold for vascular procedure. Other labs as above. No issues per RN.   Goal of Therapy:  Heparin level 0.3-0.7 units/ml Monitor platelets by anticoagulation protocol: Yes   Plan:  -Increase heparin to 1200 units/hr -0800 HL -Daily CBC/HL -Monitor for bleeding  Narda Bonds 07/24/2013,12:05 AM

## 2013-07-24 NOTE — Clinical Documentation Improvement (Signed)
A cause and effect relationship may not be assumed and must be documented by a provider. Please clarify the relationship, if any, between the patient's DM2 and PAD.  Are the conditions:   Due to or associated with each other   Unrelated to each other   Being treated with Neurontin 200mg  at bedtime daily   Other condition  Thank you, Zoila Shutter ,RN Clinical Documentation Specialist:  [place] Information Management

## 2013-07-24 NOTE — Progress Notes (Signed)
[facility]  Subjective  - Feet hurt when pain meds wear off   Objective 133/64 54 99.6 F (37.6 C) (Oral) 19 96%  Intake/Output Summary (Last 24 hours) at 07/24/13 1421 Last data filed at 07/24/13 1137  Gross per 24 hour  Intake    360 ml  Output    950 ml  Net   -590 ml   Ischemic appearing feet with rubor bilaterally  Assessment/Planning: Aortogram with bilat runoff possible intervention [place] tomorrow NPO p midnight Consent Creatinine elevated 1.7 gently hydrate overnight Hyperkalemia no real reason of deterioration will recheck am  Elam Dutch 07/24/2013 2:21 PM --  Laboratory Lab Results:  Recent Labs  07/23/13 0515 07/24/13 0535  WBC 7.6 7.9  HGB 10.4* 9.7*  HCT 32.4* 30.7*  PLT 161 152   BMET  Recent Labs  07/22/13 0426 07/24/13 0535  NA 139 136*  K 5.2 5.7*  CL 107 104  CO2 20 21  GLUCOSE 152* 74  BUN 35* 49*  CREATININE 1.26* 1.74*  CALCIUM 8.1* 8.0*    COAG Lab Results  Component Value Date   INR 1.17 07/24/2013   INR 1.77* 07/23/2013   INR 2.53* 07/23/2013   No results found for this basename: PTT

## 2013-07-24 NOTE — Progress Notes (Signed)
Clinical Social Work Department BRIEF PSYCHOSOCIAL ASSESSMENT 07/24/2013  Patient:  Natalie Porter, Natalie Porter     Account Number:  [id number]     Admit date:  07/21/2013  Clinical Social Worker:  Megan Salon  Date/Time:  07/24/2013 10:41 AM  Referred by:  RN  Date Referred:  07/24/2013 Referred for  SNF Placement   Other Referral:   Interview type:  Family Other interview type:   CSW spoke to family while patient was working with rehab    PSYCHOSOCIAL DATA Living Status:  ALONE Admitted from facility:   Level of care:   Primary support name:  Natalie Porter Primary support relationship to patient:  CHILD, ADULT Degree of support available:   Good    CURRENT CONCERNS Current Concerns  Post-Acute Placement   Other Concerns:    SOCIAL WORK ASSESSMENT / PLAN CSW received consult for SNF placement. CSW went into room and introduced self and explained reason for visit. CSW met with patient's daughter by bedside to discuss SNF placement. Patient's daughter states that they are all agreeable to SNF placement, and that patient's granddaughter is a CNA and is looking into certain facilities in [facility]. Patient's daughter stated that they prefer [facility], but their other preference would be [facility] in [place]. CSW explained the SNF process and states she will update patient and family when bed offers arise.   Assessment/plan status:  Psychosocial Support/Ongoing Assessment of Needs Other assessment/ plan:   Information/referral to community resources:   SNF information    PATIENT'S/FAMILY'S RESPONSE TO PLAN OF CARE: Patients daughter states that patient and family are in agreement to SNF placement and prefer [facility] [place].        Natalie Porter, MSW, [place]

## 2013-07-24 NOTE — Clinical Documentation Improvement (Signed)
Presents with ischemic foot, has DM2, PAD; "renal dysfunction" noted in H&P.     Patient is a white female  Creatinine range has increased from: 1.30 to 1.74 this admit; an increase of 0.44 in over 3 days  GFR's for this admit are: 28 to 41  Please clarify if you feel the patient has CKD and if so, please identify the stage for this admission from the list below. This response will enhance the severity of illness and risk of mortality for this patient.   _______CKD Stage I - GFR > OR = 90 _______CKD Stage II - GFR 60-80 _______CKD Stage III - GFR 30-59 _______CKD Stage IV - GFR 15-29 _______CKD Stage V - GFR < 15 _______ESRD (End Stage Renal Disease) _______Acute on Chronic Kidney Disease Stage _____ _______Other condition_____________   Angela Adam ,RN Clinical Documentation Specialist:  [phone]  [place] Information Management

## 2013-07-24 NOTE — Progress Notes (Signed)
Echocardiogram 2D Echocardiogram has been performed.  07/24/2013 4:14 PM Maudry Mayhew, RVT, RDCS, RDMS

## 2013-07-24 NOTE — Care Management Note (Unsigned)
    Page 1 of 1   07/27/2013     4:10:53 PM CARE MANAGEMENT NOTE 07/27/2013  Patient:  HINLEY, BRIMAGE   Account Number:  [id number]  Date Initiated:  07/24/2013  Documentation initiated by:  Rocket Gunderson  Subjective/Objective Assessment:   Pt adm with PVD on 07/21/13.  PTA, pt from home with daughter.     Action/Plan:   Will follow for dc needs as pt progresses.   Anticipated DC Date:  07/28/2013   Anticipated DC Plan:  [place] referral  Clinical Social Worker      DC Planning Services  CM consult      Choice offered to / List presented to:             Status of service:  In process, will continue to follow Medicare Important Message given?  YES (If response is "NO", the following Medicare IM given date fields will be blank) Date Medicare IM given:  07/25/2013 Date Additional Medicare IM given:    Discharge Disposition:    Per UR Regulation:  Reviewed for med. necessity/level of care/duration of stay  If discussed at [place] of Stay Meetings, dates discussed:   07/27/2013    Comments:  07/27/13 Ellan Lambert, RN, BSN [phone] Cath today for pre op clearance; vasc surgery scheduled on Monday.  07/25/13 Ellan Lambert, RN, BSN [phone] PT/OT recommending SNF at dc; CSW consulted to facilitate poss dc to SNF at dc when medically stable.

## 2013-07-25 ENCOUNTER — Inpatient Hospital Stay (HOSPITAL_COMMUNITY): Payer: Medicare Other

## 2013-07-25 ENCOUNTER — Encounter (HOSPITAL_COMMUNITY)
Admission: EM | Disposition: A | Discharge status: Home / Self Care | Payer: Self-pay | Source: Home / Self Care | Attending: Vascular Surgery

## 2013-07-25 DIAGNOSIS — Z01810 Encounter for preprocedural cardiovascular examination: Secondary | ICD-10-CM

## 2013-07-25 DIAGNOSIS — I739 Peripheral vascular disease, unspecified: Secondary | ICD-10-CM

## 2013-07-25 DIAGNOSIS — I259 Chronic ischemic heart disease, unspecified: Secondary | ICD-10-CM | POA: Diagnosis not present

## 2013-07-25 HISTORY — PX: ABDOMINAL AORTAGRAM: SHX5454

## 2013-07-25 LAB — POCT ACTIVATED CLOTTING TIME
ACTIVATED CLOTTING TIME: 177 s
Activated Clotting Time: 227 seconds
Activated Clotting Time: 232 seconds
Activated Clotting Time: 193 seconds

## 2013-07-25 LAB — CBC
HEMATOCRIT: 29.6 % — AB (ref 36.0–46.0)
Hemoglobin: 9.4 g/dL — ABNORMAL LOW (ref 12.0–15.0)
MCH: 29.5 pg (ref 26.0–34.0)
MCHC: 31.8 g/dL (ref 30.0–36.0)
MCV: 92.8 fL (ref 78.0–100.0)
Platelets: 170 10*3/uL (ref 150–400)
RBC: 3.19 MIL/uL — ABNORMAL LOW (ref 3.87–5.11)
RDW: 15.2 % (ref 11.5–15.5)
WBC: 7.8 10*3/uL (ref 4.0–10.5)

## 2013-07-25 LAB — GLUCOSE, CAPILLARY
GLUCOSE-CAPILLARY: 139 mg/dL — AB (ref 70–99)
Glucose-Capillary: 87 mg/dL (ref 70–99)
Glucose-Capillary: 91 mg/dL (ref 70–99)

## 2013-07-25 LAB — BASIC METABOLIC PANEL
BUN: 47 mg/dL — AB (ref 6–23)
CO2: 21 mEq/L (ref 19–32)
CREATININE: 1.58 mg/dL — AB (ref 0.50–1.10)
Calcium: 8.2 mg/dL — ABNORMAL LOW (ref 8.4–10.5)
Chloride: 102 mEq/L (ref 96–112)
GFR, EST AFRICAN AMERICAN: 36 mL/min — AB (ref >90)
GFR, EST NON AFRICAN AMERICAN: 31 mL/min — AB (ref >90)
Glucose, Bld: 104 mg/dL — ABNORMAL HIGH (ref 70–99)
POTASSIUM: 5.5 meq/L — AB (ref 3.7–5.3)
Sodium: 135 mEq/L — ABNORMAL LOW (ref 137–147)

## 2013-07-25 LAB — PROTIME-INR
INR: 1.19 (ref 0.00–1.49)
Prothrombin Time: 14.8 seconds (ref 11.6–15.2)

## 2013-07-25 LAB — HEPARIN LEVEL (UNFRACTIONATED): Heparin Unfractionated: 0.33 IU/mL (ref 0.30–0.70)

## 2013-07-25 SURGERY — ABDOMINAL AORTAGRAM
Anesthesia: LOCAL

## 2013-07-25 MED ORDER — ACETAMINOPHEN 325 MG PO TABS
325.0000 mg | ORAL_TABLET | ORAL | Status: DC | PRN
  Administered 2013-07-25 – 2013-07-27 (×5): 650 mg via ORAL
  Filled 2013-07-25 (×5): qty 2

## 2013-07-25 MED ORDER — GUAIFENESIN-DM 100-10 MG/5ML PO SYRP: 15.0000 mL | ORAL_SOLUTION | ORAL | Status: DC | PRN

## 2013-07-25 MED ORDER — HYDRALAZINE HCL 20 MG/ML IJ SOLN: 10.0000 mg | INTRAMUSCULAR | Status: DC | PRN

## 2013-07-25 MED ORDER — HEPARIN (PORCINE) IN NACL 2-0.9 UNIT/ML-% IJ SOLN
INTRAMUSCULAR | Status: AC
  Filled 2013-07-25: qty 1000

## 2013-07-25 MED ORDER — LIDOCAINE HCL (PF) 1 % IJ SOLN
INTRAMUSCULAR | Status: AC
  Filled 2013-07-25: qty 30

## 2013-07-25 MED ORDER — LABETALOL HCL 5 MG/ML IV SOLN
10.0000 mg | INTRAVENOUS | Status: DC | PRN
  Filled 2013-07-25: qty 4

## 2013-07-25 MED ORDER — PHENOL 1.4 % MT LIQD
1.0000 | OROMUCOSAL | Status: DC | PRN
  Filled 2013-07-25: qty 177

## 2013-07-25 MED ORDER — TECHNETIUM TC 99M SESTAMIBI GENERIC - CARDIOLITE
10.0000 | Freq: Once | INTRAVENOUS | Status: AC | PRN
  Administered 2013-07-25: 10 via INTRAVENOUS

## 2013-07-25 MED ORDER — METOPROLOL TARTRATE 1 MG/ML IV SOLN
2.0000 mg | INTRAVENOUS | Status: DC | PRN
Start: 2013-07-25 — End: 2013-07-27

## 2013-07-25 MED ORDER — REGADENOSON 0.4 MG/5ML IV SOLN
INTRAVENOUS | Status: AC
  Administered 2013-07-25: 0.4 mg via INTRAVENOUS
  Filled 2013-07-25: qty 5

## 2013-07-25 MED ORDER — ACETAMINOPHEN 650 MG RE SUPP: 325.0000 mg | RECTAL | Status: DC | PRN

## 2013-07-25 MED ORDER — ONDANSETRON HCL 4 MG/2ML IJ SOLN: 4.0000 mg | Freq: Four times a day (QID) | INTRAMUSCULAR | Status: DC | PRN

## 2013-07-25 MED ORDER — HEPARIN SODIUM (PORCINE) 1000 UNIT/ML IJ SOLN
INTRAMUSCULAR | Status: AC
  Filled 2013-07-25: qty 1

## 2013-07-25 MED ORDER — HEPARIN (PORCINE) IN NACL 100-0.45 UNIT/ML-% IJ SOLN
1200.0000 [IU]/h | INTRAMUSCULAR | Status: DC
  Administered 2013-07-26 – 2013-07-27 (×3): 1200 [IU]/h via INTRAVENOUS
  Filled 2013-07-25 (×5): qty 250

## 2013-07-25 MED ORDER — OXYCODONE-ACETAMINOPHEN 5-325 MG PO TABS
ORAL_TABLET | ORAL | Status: AC
  Filled 2013-07-25: qty 2

## 2013-07-25 MED ORDER — ALUM & MAG HYDROXIDE-SIMETH 200-200-20 MG/5ML PO SUSP
15.0000 mL | ORAL | Status: DC | PRN
Start: 2013-07-25 — End: 2013-07-27

## 2013-07-25 MED ORDER — SODIUM CHLORIDE 0.9 % IV SOLN: INTRAVENOUS | Status: DC

## 2013-07-25 MED ORDER — REGADENOSON 0.4 MG/5ML IV SOLN
0.4000 mg | Freq: Once | INTRAVENOUS | Status: AC
  Administered 2013-07-25: 0.4 mg via INTRAVENOUS

## 2013-07-25 SURGICAL SUPPLY — 53 items
BANDAGE ELASTIC 4 VELCRO ST LF (GAUZE/BANDAGES/DRESSINGS) IMPLANT
BANDAGE ESMARK 6X9 LF (GAUZE/BANDAGES/DRESSINGS) IMPLANT
BNDG ESMARK 6X9 LF (GAUZE/BANDAGES/DRESSINGS)
CANISTER SUCTION 2500CC (MISCELLANEOUS) ×3 IMPLANT
CLIP TI MEDIUM 24 (CLIP) ×3 IMPLANT
CLIP TI WIDE RED SMALL 24 (CLIP) ×3 IMPLANT
COVER SURGICAL LIGHT HANDLE (MISCELLANEOUS) ×3 IMPLANT
CUFF TOURNIQUET SINGLE 24IN (TOURNIQUET CUFF) IMPLANT
CUFF TOURNIQUET SINGLE 34IN LL (TOURNIQUET CUFF) IMPLANT
CUFF TOURNIQUET SINGLE 44IN (TOURNIQUET CUFF) IMPLANT
DERMABOND ADVANCED (GAUZE/BANDAGES/DRESSINGS) ×1
DERMABOND ADVANCED .7 DNX12 (GAUZE/BANDAGES/DRESSINGS) ×2 IMPLANT
DRAIN CHANNEL 15F RND FF W/TCR (WOUND CARE) IMPLANT
DRAPE WARM FLUID 44X44 (DRAPE) ×3 IMPLANT
DRAPE X-RAY CASS 24X20 (DRAPES) IMPLANT
DRSG COVADERM 4X10 (GAUZE/BANDAGES/DRESSINGS) IMPLANT
DRSG COVADERM 4X8 (GAUZE/BANDAGES/DRESSINGS) IMPLANT
ELECT REM PT RETURN 9FT ADLT (ELECTROSURGICAL) ×3
ELECTRODE REM PT RTRN 9FT ADLT (ELECTROSURGICAL) ×2 IMPLANT
EVACUATOR SILICONE 100CC (DRAIN) IMPLANT
GLOVE BIOGEL PI IND STRL 7.5 (GLOVE) ×2 IMPLANT
GLOVE BIOGEL PI INDICATOR 7.5 (GLOVE) ×1
GLOVE SURG SS PI 7.5 STRL IVOR (GLOVE) ×3 IMPLANT
GOWN PREVENTION PLUS XXLARGE (GOWN DISPOSABLE) ×3 IMPLANT
GOWN STRL NON-REIN LRG LVL3 (GOWN DISPOSABLE) ×9 IMPLANT
HEMOSTAT SNOW SURGICEL 2X4 (HEMOSTASIS) IMPLANT
KIT BASIN OR (CUSTOM PROCEDURE TRAY) ×3 IMPLANT
KIT ROOM TURNOVER OR (KITS) ×3 IMPLANT
MARKER GRAFT CORONARY BYPASS (MISCELLANEOUS) IMPLANT
NS IRRIG 1000ML POUR BTL (IV SOLUTION) ×6 IMPLANT
PACK PERIPHERAL VASCULAR (CUSTOM PROCEDURE TRAY) ×3 IMPLANT
PAD ARMBOARD 7.5X6 YLW CONV (MISCELLANEOUS) ×6 IMPLANT
PADDING CAST COTTON 6X4 STRL (CAST SUPPLIES) IMPLANT
SET COLLECT BLD 21X3/4 12 (NEEDLE) IMPLANT
STOPCOCK 4 WAY LG BORE MALE ST (IV SETS) IMPLANT
SUT ETHILON 3 0 PS 1 (SUTURE) IMPLANT
SUT PROLENE 5 0 C 1 24 (SUTURE) ×3 IMPLANT
SUT PROLENE 6 0 BV (SUTURE) ×3 IMPLANT
SUT PROLENE 7 0 BV 1 (SUTURE) IMPLANT
SUT SILK 2 0 SH (SUTURE) ×3 IMPLANT
SUT SILK 3 0 (SUTURE)
SUT SILK 3-0 18XBRD TIE 12 (SUTURE) IMPLANT
SUT VIC AB 2-0 CT1 27 (SUTURE) ×2
SUT VIC AB 2-0 CT1 TAPERPNT 27 (SUTURE) ×4 IMPLANT
SUT VIC AB 3-0 SH 27 (SUTURE) ×2
SUT VIC AB 3-0 SH 27X BRD (SUTURE) ×4 IMPLANT
SUT VICRYL 4-0 PS2 18IN ABS (SUTURE) ×6 IMPLANT
TOWEL OR 17X24 6PK STRL BLUE (TOWEL DISPOSABLE) ×6 IMPLANT
TOWEL OR 17X26 10 PK STRL BLUE (TOWEL DISPOSABLE) ×6 IMPLANT
TRAY FOLEY CATH 16FRSI W/METER (SET/KITS/TRAYS/PACK) ×3 IMPLANT
TUBING EXTENTION W/L.L. (IV SETS) IMPLANT
UNDERPAD 30X30 INCONTINENT (UNDERPADS AND DIAPERS) ×3 IMPLANT
WATER STERILE IRR 1000ML POUR (IV SOLUTION) ×3 IMPLANT

## 2013-07-25 NOTE — Progress Notes (Signed)
ANTICOAGULATION CONSULT NOTE - Follow Up Consult  Pharmacy Consult for Heparin  Indication: History of DVT (warfarin on hold for procedure)  Allergies  Allergen Reactions  . Codeine Nausea Only  . Morphine And Related Nausea And Vomiting    Patient Measurements: Height: 5\' 2"  (157.5 cm) Weight: 194 lb 1.6 oz (88.043 kg) IBW/kg (Calculated) : 50.1  Vital Signs: Temp: 99 F (37.2 C) (05/05 0330) Temp src: Oral (05/05 0330) BP: 140/66 mmHg (05/05 0330) Pulse Rate: 65 (05/05 0330)  Labs:  Recent Labs  07/23/13 0515 07/23/13 1400 07/23/13 2242 07/24/13 0535 07/24/13 0750 07/25/13 0427  HGB 10.4*  --   --  9.7*  --  9.4*  HCT 32.4*  --   --  30.7*  --  29.6*  PLT 161  --   --  152  --  170  LABPROT 26.4* 20.1*  --  14.7  --  14.8  INR 2.53* 1.77*  --  1.17  --  1.19  HEPARINUNFRC  --   --  0.13*  --  0.44 0.33  CREATININE  --   --   --  1.74*  --  1.58*    Estimated Creatinine Clearance: 32.7 ml/min (by C-G formula based on Cr of 1.58).  Assessment: 73 y/o F on Coumadin PTA for hx of DVT (3 yrs ago), LE doppler neg for DVT. INR down to 1.19 - s/p vit K 5mg  IV 5/3 1000. Pt at angiogram now. Pt continues on heparin bridge. HL 0.33 on gtt at 1200 units/hr. Hgb trending down - will watch. plt ok. No bleeding noted.  Goal of Therapy:  Heparin level 0.3-0.7 units/ml Monitor platelets by anticoagulation protocol: Yes   Plan:  -Continue heparin 1200 units/hr -Daily CBC/HL -Monitor for bleeding -F/u restart coumadin when able  Sherlon Handing, PharmD, BCPS Clinical pharmacist, pager [phone] 07/25/2013,10:46 AM

## 2013-07-25 NOTE — Progress Notes (Signed)
Subjective: Left foot hurts.  Nose is stuffy  Objective: Vital signs in last 24 hours: Temp:  [98.8 F (37.1 C)-99.2 F (37.3 C)] 99 F (37.2 C) (05/05 0330) Pulse Rate:  [60-65] 65 (05/05 0330) Resp:  [18-20] 18 (05/05 0330) BP: (136-155)/(34-66) 140/66 mmHg (05/05 0330) SpO2:  [91 %-93 %] 93 % (05/05 0330) Last BM Date: 07/24/13  Intake/Output from previous day: 05/04 0701 - 05/05 0700 In: 572.4 [P.O.:120; I.V.:452.4] Out: -  Intake/Output this shift: Total I/O In: 12 [I.V.:12] Out: -   Medications Current Facility-Administered Medications  Medication Dose Route Frequency Provider Last Rate Last Dose  . 0.45 % sodium chloride infusion   Intravenous Continuous Elam Dutch, MD 100 mL/hr at 07/24/13 1900    . 0.9 %  sodium chloride infusion  250 mL Intravenous PRN Elam Dutch, MD      . acetaminophen (TYLENOL) tablet 325-650 mg  325-650 mg Oral Q4H PRN Elam Dutch, MD       Or  . acetaminophen (TYLENOL) suppository 325-650 mg  325-650 mg Rectal Q4H PRN Elam Dutch, MD      . albuterol (PROVENTIL) (2.5 MG/3ML) 0.083% nebulizer solution 2.5 mg  2.5 mg Inhalation Q6H PRN Elam Dutch, MD      . alum & mag hydroxide-simeth (MAALOX/MYLANTA) 200-200-20 MG/5ML suspension 15-30 mL  15-30 mL Oral Q2H PRN Elam Dutch, MD      . amLODipine (NORVASC) tablet 10 mg  10 mg Oral Daily Elam Dutch, MD   10 mg at 07/24/13 1115  . calcium-vitamin D (OSCAL WITH D) 500-200 MG-UNIT per tablet 1 tablet  1 tablet Oral BID WC Elam Dutch, MD   1 tablet at 07/24/13 1642  . carvedilol (COREG) tablet 25 mg  25 mg Oral BID WC Elam Dutch, MD   25 mg at 07/24/13 1643  . chlorthalidone (HYGROTON) tablet 50 mg  50 mg Oral Daily Elam Dutch, MD   50 mg at 07/24/13 1655  . docusate sodium (COLACE) capsule 100 mg  100 mg Oral BID Elam Dutch, MD   100 mg at 07/24/13 2105  . escitalopram (LEXAPRO) tablet 20 mg  20 mg Oral Daily Elam Dutch, MD   20  mg at 07/24/13 1117  . gabapentin (NEURONTIN) capsule 100 mg  100 mg Oral Daily Elam Dutch, MD   100 mg at 07/24/13 1117   And  . gabapentin (NEURONTIN) capsule 200 mg  200 mg Oral QHS Elam Dutch, MD   200 mg at 07/24/13 2105  . glipiZIDE (GLUCOTROL XL) 24 hr tablet 10 mg  10 mg Oral Q breakfast Elam Dutch, MD   10 mg at 07/24/13 [phone]  . guaiFENesin-dextromethorphan (ROBITUSSIN DM) 100-10 MG/5ML syrup 15 mL  15 mL Oral Q4H PRN Elam Dutch, MD      . heparin ADULT infusion 100 units/mL (25000 units/250 mL)  1,200 Units/hr Intravenous Continuous Narda Bonds, RPH 12 mL/hr at 07/25/13 0800 1,200 Units/hr at 07/25/13 0800  . hydrALAZINE (APRESOLINE) injection 10 mg  10 mg Intravenous Q2H PRN Elam Dutch, MD      . HYDROmorphone (DILAUDID) injection 0.5-1 mg  0.5-1 mg Intravenous Q2H PRN Elam Dutch, MD   1 mg at 07/23/13 0515  . insulin aspart (novoLOG) injection 0-20 Units  0-20 Units Subcutaneous TID WC Elam Dutch, MD   3 Units at 07/23/13 1229  . insulin glargine (LANTUS) injection 13  Units  13 Units Subcutaneous QHS Elam Dutch, MD   13 Units at 07/24/13 2104  . labetalol (NORMODYNE,TRANDATE) injection 10 mg  10 mg Intravenous Q2H PRN Elam Dutch, MD      . metoprolol (LOPRESSOR) injection 2-5 mg  2-5 mg Intravenous Q2H PRN Elam Dutch, MD      . ondansetron [facility]) injection 4 mg  4 mg Intravenous Q6H PRN Elam Dutch, MD   4 mg at 07/22/13 0858  . oxyCODONE-acetaminophen (PERCOCET/ROXICET) 5-325 MG per tablet 1-2 tablet  1-2 tablet Oral Q4H PRN Elam Dutch, MD   2 tablet at 07/25/13 [phone]  . pantoprazole (PROTONIX) EC tablet 40 mg  40 mg Oral Daily Elam Dutch, MD   40 mg at 07/24/13 1000  . phenol (CHLORASEPTIC) mouth spray 1 spray  1 spray Mouth/Throat PRN Elam Dutch, MD      . pioglitazone (ACTOS) tablet 45 mg  45 mg Oral Q breakfast Elam Dutch, MD   45 mg at 07/24/13 0610  . sodium chloride 0.9 % injection 3 mL   3 mL Intravenous Q12H Elam Dutch, MD   3 mL at 07/25/13 0242  . sodium chloride 0.9 % injection 3 mL  3 mL Intravenous PRN Elam Dutch, MD   3 mL at 07/24/13 1119  . tiotropium (SPIRIVA) inhalation capsule 18 mcg  18 mcg Inhalation Daily Elam Dutch, MD   18 mcg at 07/24/13 6834    PE: Physical Exam   Constitutional: She is oriented to person, place, and time. She appears well-developed. No distress.  Obese  Eyes: EOM are normal. Pupils are equal, round, and reactive to light. No scleral icterus.  Neck: Normal range of motion.   Cardiovascular: Normal rate, regular rhythm, S1 normal and S2 normal.  Murmur heard.  Systolic murmur is present with a grade of 2/6  Pulses:  Radial pulses are 2+ on the right side, and 2+ on the left side.  Dorsalis pedis pulses are 1+ on the right side, and 2+ on the left side.  Posterior tibial pulses are 1+ on the right side, and 2+ on the left side.  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  Respiratory: Effort normal. She has wheezes (right side). She has no rales.  Musculoskeletal: She exhibits edema (Trace).  Neurological: She is alert and oriented to person, place, and time. She exhibits normal muscle tone.  Skin: Skin is warm and dry.  Left foot with ecchymosis/purple    Lab Results:   Recent Labs  07/23/13 0515 07/24/13 0535 07/25/13 0427  WBC 7.6 7.9 7.8  HGB 10.4* 9.7* 9.4*  HCT 32.4* 30.7* 29.6*  PLT 161 152 170   BMET  Recent Labs  07/24/13 0535 07/25/13 0427  NA 136* 135*  K 5.7* 5.5*  CL 104 102  CO2 21 21  GLUCOSE 74 104*  BUN 49* 47*  CREATININE 1.74* 1.58*  CALCIUM 8.0* 8.2*   PT/INR  Recent Labs  07/23/13 1400 07/24/13 0535 07/25/13 0427  LABPROT 20.1* 14.7 14.8  INR 1.77* 1.17 1.19   Cholesterol No results found for this basename: CHOL,  in the last 72 hours Cardiac Enzymes No components found with this basename: TROPONIN,  CKMB,    Studies/Results: @RISRSLT2 @   Assessment/Plan  The patient is a 73 yo female with a history of PAD with prior left CEA, OSA-CPAP, DM, HTN, renal disorder, gallstone, DVT, arthritis. She  presented with left foot pain and non-healing wounds. ABI's are 0.2 on the left and 0.4 on the right. Cardiomegaly on CXR. The patient reports developing foot problems with in the last six weeks. She had LEA dopplers back in April in [place] with no apparent concerns. She's had worsening LE pain and nonhealing ulcers particularly in the left leg. The patient currently denies nausea, vomiting, fever, chest pain, shortness of breath, orthopnea, dizziness, PND, cough, congestion, abdominal pain, hematochezia, melena.   Active Problems:  1. PAD (peripheral artery disease)  History of left carotid endarterectomy and new low extremity disease. Left ABI 0.2, right 0.4 .  She is in the [facility] lab .   2. Pre Op Clearance  Cardiomegaly on CXR. RBBB on EKG. 2D echo pending and Lexiscan myovue completed.  Interpretation to follow.  3. Hyperkalemia   Still 5.5Continue to monitor- hold potassium   4. HTN  She is on amlodipine, coreg 25 bid, Chlorthalidone. BP ok. Continue to monitor.  5. Acute on chronic kidney disease.  SCr improved to 1.58.    LOS: 4 days    Natalie Fuller PA-C 07/25/2013 11:13 AM  Attending Note:   The patient was seen and examined.  Agree with assessment and plan as noted above.  Changes made to the above note as needed.  Pt is getting lower extremity angiogram. myoview study is in progress.  Thayer Headings, Brooke Bonito., MD, [facility] 07/25/2013, 3:18 PM

## 2013-07-25 NOTE — Interval H&P Note (Signed)
History and Physical Interval Note:  07/25/2013 11:48 AM  Natalie Porter  has presented today for surgery, with the diagnosis of pvd  The various methods of treatment have been discussed with the patient and family. After consideration of risks, benefits and other options for treatment, the patient has consented to  Procedure(s): ABDOMINAL AORTAGRAM (N/A) as a surgical intervention .  The patient's history has been reviewed, patient examined, no change in status, stable for surgery.  I have reviewed the patient's chart and labs.  Questions were answered to the patient's satisfaction.     Serafina Mitchell

## 2013-07-25 NOTE — Evaluation (Signed)
Occupational Therapy Evaluation Patient Details Name: Natalie Porter MRN: [id number] DOB: 1940/06/07 Today's Date: 07/25/2013    History of Present Illness  Patient is a 73 y.o. year old female who presents for evaluation of left foot pain with non healing wounds on left foot.   Clinical Impression   PT admitted with Lt foot pain and non healing wound. Pt currently with functional limitiations due to the deficits listed below (see OT problem list).  Pt will benefit from skilled OT to increase their independence and safety with adls and balance to allow discharge SNF.      SNF    Equipment Recommendations  Other (comment) (defer snf)    Recommendations for Other Services       Precautions / Restrictions Precautions Precautions: Fall Precaution Comments: ischemic bilat feet L >R      Mobility Bed Mobility Overal bed mobility: Needs Assistance Bed Mobility: Supine to Sit;Sit to Supine     Supine to sit: Mod assist;HOB elevated Sit to supine: Mod assist;HOB elevated   General bed mobility comments: requires use of bed rails  Transfers Overall transfer level: Needs assistance Equipment used: Rolling walker (2 wheeled) Transfers: Sit to/from Stand Sit to Stand: Mod assist         General transfer comment: cues for hand placement    Balance Overall balance assessment: Needs assistance Sitting-balance support: Bilateral upper extremity supported;Feet supported Sitting balance-Leahy Scale: Good     Standing balance support: Bilateral upper extremity supported;During functional activity Standing balance-Leahy Scale: Poor                              ADL Overall ADL's : Needs assistance/impaired Eating/Feeding: Set up;Sitting   Grooming: Wash/dry face;Set up;Sitting                   Toilet Transfer: Moderate assistance;Stand-pivot;RW;BSC   Toileting- Clothing Manipulation and Hygiene: Maximal assistance;Sit to/from stand Toileting -  Clothing Manipulation Details (indicate cue type and reason): total (A) to don doff pull ups     Functional mobility during ADLs: Moderate assistance;Rolling walker General ADL Comments: Pt supine and requires therapist to be positioned so she can read lips. pt requesting 3n1 to void bladder. Pt with urgency and voiding prior to sitting. pt wears pull ups at baseline. pt reports severe pain in LT LE. pt weight bearing on Rt LE and decr pressure on LT LE ( only at heel)     Vision                     Perception Perception Perception Tested?: No   Praxis Praxis Praxis tested?: Not tested    Pertinent Vitals/Pain 10 out 10 LT LE with weight bearing Minimal pain supine      Hand Dominance Right   Extremity/Trunk Assessment Upper Extremity Assessment Upper Extremity Assessment: Overall WFL for tasks assessed   Lower Extremity Assessment Lower Extremity Assessment: Defer to PT evaluation   Cervical / Trunk Assessment Cervical / Trunk Assessment: Normal   Communication Communication Communication: HOH   Cognition Arousal/Alertness: Awake/alert Behavior During Therapy: WFL for tasks assessed/performed Overall Cognitive Status: Within Functional Limits for tasks assessed                     General Comments       Exercises       Shoulder Instructions      Home Living Family/patient expects to  be discharged to:: Private residence Living Arrangements: Children Available Help at Discharge: Family;Available PRN/intermittently Type of Home: House Home Access: Ramped entrance     Home Layout: One level     Bathroom Shower/Tub: Teacher, early years/pre: Handicapped height     Home Equipment: Environmental consultant - 4 wheels;Bedside commode;Shower seat;Hand held Hotel manager   Additional Comments: granddaughter is CNA and (A) patient. Lives with granddaughter the CNA      Prior Functioning/Environment Level of Independence: Needs  assistance  Gait / Transfers Assistance Needed: uses hover round, limited to household distance due to pain ADL's / Homemaking Assistance Needed: daughter does cooking and assist with bathing        OT Diagnosis: Generalized weakness;Acute pain   OT Problem List: Decreased strength;Decreased activity tolerance;Impaired balance (sitting and/or standing);Decreased safety awareness;Decreased knowledge of precautions;Decreased knowledge of use of DME or AE;Obesity;Pain   OT Treatment/Interventions: Self-care/ADL training;Therapeutic exercise;DME and/or AE instruction;Therapeutic activities;Patient/family education;Balance training    OT Goals(Current goals can be found in the care plan section) Acute Rehab OT Goals Patient Stated Goal: to see my dog OT Goal Formulation: With patient/family Time For Goal Achievement: 08/08/13 Potential to Achieve Goals: Good  OT Frequency: Min 2X/week   Barriers to D/C:            Co-evaluation              End of Session Equipment Utilized During Treatment: Rolling walker;Gait belt Nurse Communication: Mobility status;Precautions  Activity Tolerance: Patient tolerated treatment well Patient left: in bed;with call bell/phone within reach;with bed alarm set   Time: 8413-2440 OT Time Calculation (min): 22 min Charges:  OT General Charges $OT Visit: 1 Procedure OT Evaluation $Initial OT Evaluation Tier I: 1 Procedure OT Treatments $Self Care/Home Management : 8-22 mins G-Codes:    Peri Maris 08/20/13, 2:15 PM Pager: [phone]

## 2013-07-25 NOTE — H&P (View-Only) (Signed)
Foot hurts except when she gets pain meds.    Filed Vitals:   07/22/13 0430 07/22/13 1356 07/22/13 2100 07/23/13 0416  BP: 154/68 152/68 130/46 166/64  Pulse: 56 60 62 72  Temp: 98.1 F (36.7 C) 98 F (36.7 C) 99 F (37.2 C) 100.1 F (37.8 C)  TempSrc: Oral Oral Oral Oral  Resp: 20 18 19 20   Height:      Weight:      SpO2: 97% 98% 91% 91%    Left and right foot cool dusky compared to yesterday  Data: No DVT bilaterally, Carotid duplex <40% bilaterally, ABI .9 right 0.7 left probably falsely elevated due to calcification   EKG right bundle branch  A: Bilateral chronic lower extremity severe PAD P: 1.  Cardiology consult for preop risk stratification tomorrow      2. PFT tomorrow      3. Gently IV hydrate tomorrow in anticipation of Agram Tuesday      4. Vitamin k today to reverse coumadin      5. Heparin drip when INR <1.6  Ruta Hinds, MD [facility] Office: [phone] Pager: [phone]

## 2013-07-25 NOTE — Op Note (Signed)
Patient name: Natalie Porter MRN: [id number] DOB: Sep 17, 1940 Sex: female  07/21/2013 - 07/25/2013 Pre-operative Diagnosis: Bilateral rest pain with left leg ulcer Post-operative diagnosis:  Same Surgeon:  Serafina Mitchell Procedure Performed:  1.  ultrasound-guided access, right femoral artery  2.  ultrasound-guided access, left femoral artery  3.  abdominal aortogram   4.  bilateral lower extremity runoff  5.  stent, abdominal aorta  6.  stent, right common iliac artery   Indications:  The patient has bilateral rest pain and ulceration of the left.  She comes in for further evaluation and possible treatment.  Procedure:  The patient was identified in the holding area and taken to room 8.  The patient was then placed supine on the table and prepped and draped in the usual sterile fashion.  A time out was called.  Ultrasound was used to evaluate the right common femoral artery.  It was patent .  A digital ultrasound image was acquired.  A micropuncture needle was used to access the right common femoral artery under ultrasound guidance.  An 018 wire was advanced without resistance and a micropuncture sheath was placed.  The 018 wire was removed and a benson wire was placed.  The micropuncture sheath was exchanged for a 5 french sheath.  An omniflush catheter was advanced over the wire to the level of L-1.  An abdominal angiogram was obtained.  At this point, I elected to gain a second access in the left groin.  This was done under ultrasound guidance after anesthetizing the skin with lidocaine.  The left femoral artery was cannulated under ultrasound guidance a micropuncture needle.  A wire was advanced without resistance and a micropuncture sheath was placed followed by a 5 [place] sheath.  The patient was fully heparinized. I tried to perform subintimal recanalization of the occluded left common iliac artery.  I used a Kumpe catheter, a straight catheter, and a Glidewire.  I was unable to recanalize  this as a Ongoing subintimal.  I then tried to come from the contralateral side again I was unable to cross the occlusion.  At this point I elected to treat the aorta and right iliac artery with anticipation of surgical revascularization of the left leg.  A 7 French sheath had previously been advanced into the aorta from the right side.  A Rosen wire was placed.  I selected a 8 x 38 atrium covered stent and performed stenting of the distal abdominal aorta and right common iliac artery.  The balloon was taken to nominal pressure. Completion angiography revealed successful stenting of the distal aorta and right common iliac artery.  The patient taken the holding area for sheath pull once her coagulation profile corrects.  Findings:   Aortogram:  Bilateral renal arteries are widely patent.  The infrarenal abdominal aorta is heavily calcified and is nearly occluded at its distal extent.  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  Significant high-grade stenosis at the origin of the right common iliac artery is noted.  The distal right common and external iliac artery are widely patent.  Right Lower Extremity:  The right common femoral artery is widely patent the profunda femoral and superficial femoral arteries are widely patent.  The popliteal artery is widely patent.  The tibial vessels could not be evaluated secondary to patient movement.  Left Lower Extremity:  The left common  femoral artery is widely patent the profunda and superficial femoral artery are widely patent.  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 Annamarie Major, M.D. [facility] Office: [phone] Pager:  [phone]

## 2013-07-26 ENCOUNTER — Other Ambulatory Visit: Payer: Self-pay | Admitting: *Deleted

## 2013-07-26 ENCOUNTER — Encounter (HOSPITAL_COMMUNITY): Payer: Self-pay | Admitting: Pharmacy Technician

## 2013-07-26 ENCOUNTER — Other Ambulatory Visit: Payer: Self-pay

## 2013-07-26 DIAGNOSIS — R9439 Abnormal result of other cardiovascular function study: Secondary | ICD-10-CM

## 2013-07-26 DIAGNOSIS — Z01818 Encounter for other preprocedural examination: Secondary | ICD-10-CM

## 2013-07-26 DIAGNOSIS — I739 Peripheral vascular disease, unspecified: Secondary | ICD-10-CM | POA: Diagnosis not present

## 2013-07-26 LAB — CBC
HEMATOCRIT: 30.6 % — AB (ref 36.0–46.0)
HEMOGLOBIN: 9.9 g/dL — AB (ref 12.0–15.0)
MCH: 29.7 pg (ref 26.0–34.0)
MCHC: 32.4 g/dL (ref 30.0–36.0)
MCV: 91.9 fL (ref 78.0–100.0)
Platelets: 156 10*3/uL (ref 150–400)
RBC: 3.33 MIL/uL — ABNORMAL LOW (ref 3.87–5.11)
RDW: 15.1 % (ref 11.5–15.5)
WBC: 7.9 10*3/uL (ref 4.0–10.5)

## 2013-07-26 LAB — BASIC METABOLIC PANEL
BUN: 43 mg/dL — ABNORMAL HIGH (ref 6–23)
CHLORIDE: 104 meq/L (ref 96–112)
CO2: 21 mEq/L (ref 19–32)
Calcium: 8.6 mg/dL (ref 8.4–10.5)
Creatinine, Ser: 1.41 mg/dL — ABNORMAL HIGH (ref 0.50–1.10)
GFR calc Af Amer: 42 mL/min — ABNORMAL LOW (ref >90)
GFR, EST NON AFRICAN AMERICAN: 36 mL/min — AB (ref >90)
Glucose, Bld: 75 mg/dL (ref 70–99)
Potassium: 5.3 mEq/L (ref 3.7–5.3)
SODIUM: 137 meq/L (ref 137–147)

## 2013-07-26 LAB — HEPARIN LEVEL (UNFRACTIONATED): HEPARIN UNFRACTIONATED: 0.36 [IU]/mL (ref 0.30–0.70)

## 2013-07-26 LAB — GLUCOSE, CAPILLARY
GLUCOSE-CAPILLARY: 82 mg/dL (ref 70–99)
GLUCOSE-CAPILLARY: 120 mg/dL — AB (ref 70–99)
Glucose-Capillary: 122 mg/dL — ABNORMAL HIGH (ref 70–99)
Glucose-Capillary: 115 mg/dL — ABNORMAL HIGH (ref 70–99)

## 2013-07-26 LAB — PROTIME-INR
INR: 1.07 (ref 0.00–1.49)
Prothrombin Time: 13.7 seconds (ref 11.6–15.2)

## 2013-07-26 MED ORDER — ASPIRIN 81 MG PO CHEW
81.0000 mg | CHEWABLE_TABLET | ORAL | Status: AC
  Administered 2013-07-27: 81 mg via ORAL
  Filled 2013-07-26: qty 1

## 2013-07-26 MED ORDER — DEXTROSE 5 % IV SOLN
1.5000 g | INTRAVENOUS | Status: AC
  Administered 2013-07-27: 1.5 g via INTRAVENOUS
  Filled 2013-07-26: qty 1.5

## 2013-07-26 MED ORDER — CHLORHEXIDINE GLUCONATE 4 % EX LIQD
60.0000 mL | Freq: Once | CUTANEOUS | Status: DC
  Filled 2013-07-26: qty 60

## 2013-07-26 MED ORDER — SODIUM CHLORIDE 0.9 % IV SOLN
INTRAVENOUS | Status: DC
  Administered 2013-07-27: 1000 mL via INTRAVENOUS

## 2013-07-26 MED ORDER — SODIUM CHLORIDE 0.9 % IV SOLN: 250.0000 mL | INTRAVENOUS | Status: DC | PRN

## 2013-07-26 MED ORDER — OXYCODONE HCL 5 MG PO TABS
5.0000 mg | ORAL_TABLET | ORAL | Status: DC | PRN
Start: 2013-07-26 — End: 2013-07-27
  Administered 2013-07-26 – 2013-07-27 (×7): 5 mg via ORAL
  Filled 2013-07-26 (×7): qty 1

## 2013-07-26 MED ORDER — SODIUM CHLORIDE 0.9 % IJ SOLN: 3.0000 mL | Freq: Two times a day (BID) | INTRAMUSCULAR | Status: DC

## 2013-07-26 MED ORDER — SODIUM CHLORIDE 0.9 % IJ SOLN: 3.0000 mL | INTRAMUSCULAR | Status: DC | PRN

## 2013-07-26 MED ORDER — CHLORHEXIDINE GLUCONATE CLOTH 2 % EX PADS
6.0000 | MEDICATED_PAD | Freq: Once | CUTANEOUS | Status: DC
Start: 2013-07-26 — End: 2013-07-27

## 2013-07-26 NOTE — Progress Notes (Addendum)
Subjective: Rt foot better. No history of chest pain.  Does have a hx of significant PVD.  myoview showed moderate anterior wall ischemia.  SDS = 5.  LV function is normal.   Objective: Vital signs in last 24 hours: Temp:  [98.2 F (36.8 C)-98.3 F (36.8 C)] 98.3 F (36.8 C) (05/06 0437) Pulse Rate:  [53-56] 53 (05/06 0437) Resp:  [17] 17 (05/06 0437) BP: (102-158)/(29-96) 142/69 mmHg (05/06 0437) SpO2:  [93 %-95 %] 95 % (05/06 0437) Last BM Date: 07/24/13  Intake/Output from previous day: 05/05 0701 - 05/06 0700 In: 464 [I.V.:464] Out: -  Intake/Output this shift:    Medications Current Facility-Administered Medications  Medication Dose Route Frequency Provider Last Rate Last Dose  . 0.45 % sodium chloride infusion   Intravenous Continuous Elam Dutch, MD 100 mL/hr at 07/24/13 1900    . 0.9 %  sodium chloride infusion   Intravenous Continuous Serafina Mitchell, MD      . acetaminophen (TYLENOL) tablet 325-650 mg  325-650 mg Oral Q4H PRN Serafina Mitchell, MD   650 mg at 07/26/13 1039   Or  . acetaminophen (TYLENOL) suppository 325-650 mg  325-650 mg Rectal Q4H PRN Serafina Mitchell, MD      . albuterol (PROVENTIL) (2.5 MG/3ML) 0.083% nebulizer solution 2.5 mg  2.5 mg Inhalation Q6H PRN Elam Dutch, MD      . alum & mag hydroxide-simeth (MAALOX/MYLANTA) 200-200-20 MG/5ML suspension 15-30 mL  15-30 mL Oral Q2H PRN Serafina Mitchell, MD      . amLODipine (NORVASC) tablet 10 mg  10 mg Oral Daily Elam Dutch, MD   10 mg at 07/26/13 1042  . calcium-vitamin D (OSCAL WITH D) 500-200 MG-UNIT per tablet 1 tablet  1 tablet Oral BID WC Elam Dutch, MD   1 tablet at 07/26/13 1039  . carvedilol (COREG) tablet 25 mg  25 mg Oral BID WC Elam Dutch, MD   25 mg at 07/26/13 1041  . chlorthalidone (HYGROTON) tablet 50 mg  50 mg Oral Daily Elam Dutch, MD   50 mg at 07/26/13 1041  . escitalopram (LEXAPRO) tablet 20 mg  20 mg Oral Daily Elam Dutch, MD   20 mg at  07/26/13 1042  . gabapentin (NEURONTIN) capsule 100 mg  100 mg Oral Daily Elam Dutch, MD   100 mg at 07/26/13 1042   And  . gabapentin (NEURONTIN) capsule 200 mg  200 mg Oral QHS Elam Dutch, MD   200 mg at 07/25/13 2143  . glipiZIDE (GLUCOTROL XL) 24 hr tablet 10 mg  10 mg Oral Q breakfast Elam Dutch, MD   10 mg at 07/26/13 1041  . guaiFENesin-dextromethorphan (ROBITUSSIN DM) 100-10 MG/5ML syrup 15 mL  15 mL Oral Q4H PRN Serafina Mitchell, MD      . heparin ADULT infusion 100 units/mL (25000 units/250 mL)  1,200 Units/hr Intravenous Continuous Elam Dutch, MD 12 mL/hr at 07/26/13 0027 1,200 Units/hr at 07/26/13 0027  . labetalol (NORMODYNE,TRANDATE) injection 10 mg  10 mg Intravenous Q2H PRN Serafina Mitchell, MD       Or  . hydrALAZINE (APRESOLINE) injection 10 mg  10 mg Intravenous Q2H PRN Serafina Mitchell, MD      . insulin aspart (novoLOG) injection 0-20 Units  0-20 Units Subcutaneous TID WC Elam Dutch, MD   3 Units at 07/23/13 1229  . insulin glargine (LANTUS) injection 13 Units  13 Units Subcutaneous QHS Elam Dutch, MD   13 Units at 07/25/13 2144  . metoprolol (LOPRESSOR) injection 2-5 mg  2-5 mg Intravenous Q2H PRN Serafina Mitchell, MD      . ondansetron [facility]) injection 4 mg  4 mg Intravenous Q6H PRN Serafina Mitchell, MD      . phenol (CHLORASEPTIC) mouth spray 1 spray  1 spray Mouth/Throat PRN Serafina Mitchell, MD      . pioglitazone (ACTOS) tablet 45 mg  45 mg Oral Q breakfast Elam Dutch, MD   45 mg at 07/26/13 1039  . tiotropium (SPIRIVA) inhalation capsule 18 mcg  18 mcg Inhalation Daily Elam Dutch, MD   18 mcg at 07/26/13 0931    PE: Physical Exam   Constitutional: She is oriented to person, place, and time. She appears well-developed. No distress.  Obese  Eyes: EOM are normal. Pupils are equal, round, and reactive to light. No scleral icterus.  Neck: Normal range of motion.   Cardiovascular: Normal rate, regular rhythm, S1 normal and S2  normal.  Murmur heard.  Systolic murmur is present with a grade of 2/6  Pulses:  Radial pulses are 2+ on the right side, and 2+ on the left side.  Dorsalis pedis pulses are 1+ on the right side, and 2+ on the left side.  Posterior tibial pulses are 1+ on the right side, and 2+ on the left side.  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  Respiratory: Effort normal. She has wheezes (right side). She has no rales.  Musculoskeletal: She exhibits edema (Trace).  Neurological: She is alert and oriented to person, place, and time. She exhibits normal muscle tone.  Skin: Skin is warm and dry.  Left foot with ecchymosis/purple    Lab Results:   Recent Labs  07/24/13 0535 07/25/13 0427 07/26/13 0745  WBC 7.9 7.8 7.9  HGB 9.7* 9.4* 9.9*  HCT 30.7* 29.6* 30.6*  PLT 152 170 156   BMET  Recent Labs  07/24/13 0535 07/25/13 0427 07/26/13 0745  NA 136* 135* 137  K 5.7* 5.5* 5.3  CL 104 102 104  CO2 21 21 21   GLUCOSE 74 104* 75  BUN 49* 47* 43*  CREATININE 1.74* 1.58* 1.41*  CALCIUM 8.0* 8.2* 8.6   PT/INR  Recent Labs  07/24/13 0535 07/25/13 0427 07/26/13 0745  LABPROT 14.7 14.8 13.7  INR 1.17 1.19 1.07   Cholesterol No results found for this basename: CHOL,  in the last 72 hours Cardiac Enzymes No components found with this basename: TROPONIN,  CKMB,   Studies/Results: Myoview- 07/25/13- IMPRESSION: 1. Positive for moderate focus of inducible ischemia in the mid ventricular anterior wall extending toward the apex. 2. Normal cardiac wall motion. 3. Calculated ejection fraction 66%. These results will be called to the ordering clinician or representative by the Radiologist Assistant, and communication documented in the PACS Dashboard.      Assessment/Plan  The patient is a 73 yo female with a history of PAD with prior left CEA, OSA-CPAP, DM, HTN, renal disorder, gallstone, DVT, arthritis. She presented with left foot pain  and non-healing wounds. ABI's are 0.2 on the left and 0.4 on the right. Cardiomegaly on CXR. The patient reports developing foot problems with in the last six weeks. She had LEA dopplers back in April in [place] with no apparent concerns. She's had worsening LE pain and nonhealing ulcers particularly in the left leg.  The patient currently denies nausea, vomiting, fever, chest pain, shortness of breath, orthopnea, dizziness, PND, cough, congestion, abdominal pain, hematochezia, melena.   Active Problems:  1. PAD (peripheral artery disease)  History of left carotid endarterectomy and new low extremity disease. Left ABI 0.2, right 0.4. PV angiogram 07/26/13- unsuccessful attempt at recannulization of the left common iliac artery occlusion, successful stenting of the high-grade stenosis involving the distal abdominal aorta and proximal right common iliac artery using a atrium 8 x 38 covered stent.   2. Pre Op Clearance  Cardiomegaly on CXR. RBBB on EKG. 2D echo Interpretation to follow, Myoview abnormal with mid- anterior wall ischemia.  For cath tomorrow.   3. Hyperkalemia   Still 5.5Continue to monitor- hold potassium   4. HTN  She is on amlodipine, coreg 25 bid, Chlorthalidone. BP ok. Continue to monitor.   5. Acute on chronic kidney disease.  SCr improved to 1.41    Plan-   LOS: 5 days    Erlene Quan PA-C 07/26/2013 10:53 AM  Attending Note:   The patient was seen and examined.  Agree with assessment and plan as noted above.  Changes made to the above note as needed.  I have reviewed her Myoview. She has a moderate ischemia along the entire anterior wall. Left ventricle systolic function is normal. Her the contractility of the wall is normal.  Given her significant peripheral vascular history, I think that it will be important to define her coronary anatomy before she undergoes a major vascular operation. We'll schedule her for cardiac cath  tomorrow.  Thayer Headings,  Brooke Bonito., MD, [facility] 07/26/2013, 11:18 AM

## 2013-07-26 NOTE — Progress Notes (Signed)
Assisted patient while she applied nasal pillows. Patient tolerating well at this time.

## 2013-07-26 NOTE — Progress Notes (Signed)
Consent signed by patient and RN for cardiac cath scheduled for tomorrow 07/27/13.  Carollee Sires, RN

## 2013-07-26 NOTE — Progress Notes (Signed)
CSW spoke to patient at bedside about dc plan. Patient states she is going home with her daughter and granddaughter until she has to come back for a procedure and then may think about SNF . CSW signing off at this time. Please re consult if social work needs arise.  Jeanette Caprice, MSW, [address]

## 2013-07-26 NOTE — Progress Notes (Addendum)
On-call MD paged about Heparin orders. Per MD, restart Heparin @12  six hours after post sheath removal. Pharmacy notified, Heparin ordered. Will continue to monitor.

## 2013-07-26 NOTE — Progress Notes (Addendum)
[facility] Progress Note  07/26/2013 8:25 AM 1 Day Post-Op  Subjective:  Right foot is dramatically better  Afebrile VSS 95% 2LO2NC  Filed Vitals:   07/26/13 0437  BP: 142/69  Pulse: 53  Temp: 98.3 F (36.8 C)  Resp: 17    Physical Exam: Incisions:  Bilateral groins are soft without hematoma Extremities:  Biphasic doppler signal right DP; sensation is in tact right foot with good motor function.  monophasic doppler signal left DP sensation and motor function is decreased.  erythem left forefoot to toes.  CBC    Component Value Date/Time   WBC 7.9 07/26/2013 0745   RBC 3.33* 07/26/2013 0745   HGB 9.9* 07/26/2013 0745   HCT 30.6* 07/26/2013 0745   PLT 156 07/26/2013 0745   MCV 91.9 07/26/2013 0745   MCH 29.7 07/26/2013 0745   MCHC 32.4 07/26/2013 0745   RDW 15.1 07/26/2013 0745   BMET    Component Value Date/Time   NA 137 07/26/2013 0745   K 5.3 07/26/2013 0745   CL 104 07/26/2013 0745   CO2 21 07/26/2013 0745   GLUCOSE 75 07/26/2013 0745   BUN 43* 07/26/2013 0745   CREATININE 1.41* 07/26/2013 0745   CALCIUM 8.6 07/26/2013 0745   GFRNONAA 36* 07/26/2013 0745   GFRAA 42* 07/26/2013 0745     INR    Component Value Date/Time   INR 1.07 07/26/2013 0745     Intake/Output Summary (Last 24 hours) at 07/26/13 0825 Last data filed at 07/26/13 0600  Gross per 24 hour  Intake    452 ml  Output      0 ml  Net    452 ml     Assessment:  73 y.o. female is s/p:  1. ultrasound-guided access, right femoral artery  2. ultrasound-guided access, left femoral artery  3. abdominal aortogram  4. bilateral lower extremity runoff  5. stent, abdominal aorta  6. stent, right common iliac artery  1 Day Post-Op  Plan: -pt's right foot is dramatically improved per pt/daughter -pt may need femoral to femoral bypass in the future. -potentially discharge today.  Will d/w Dr. Oneida Alar about anticoagulation as she is on heparin gtt at this point.  She was on coumadin prior to admission. -BUN/Cr  improved today (pt had total of 130 cc's of contrast yesterday) -DVT prophylaxis:  Heparin gtt   Leontine Locket, PA-C [facility] [phone] 07/26/2013 8:25 AM    Right foot improved Will need fem fem next week May 11.  Risk benefit procedure details discussed with pt and daughter.  Will d/c home later today if no further cardiac workup required. Creatinine improved Moderate area of ischemia on non invasive testing.  May need cath prior will leave decision to Dr Acie Fredrickson.  Ruta Hinds, MD [facility] [place] Office: [phone] Pager: [phone]

## 2013-07-26 NOTE — Progress Notes (Signed)
ANTICOAGULATION CONSULT NOTE - Follow Up Consult  Pharmacy Consult for Heparin  Indication: History of DVT (warfarin on hold for procedure)  Allergies  Allergen Reactions  . Codeine Nausea Only  . Morphine And Related Nausea And Vomiting    Patient Measurements: Height: 5\' 2"  (157.5 cm) Weight: 194 lb 1.6 oz (88.043 kg) IBW/kg (Calculated) : 50.1  Vital Signs: Temp: 98.3 F (36.8 C) (05/06 0437) Temp src: Oral (05/06 0437) BP: 142/69 mmHg (05/06 0437) Pulse Rate: 53 (05/06 0437)  Labs:  Recent Labs  07/24/13 0535 07/24/13 0750 07/25/13 0427 07/26/13 0745  HGB 9.7*  --  9.4* 9.9*  HCT 30.7*  --  29.6* 30.6*  PLT 152  --  170 156  LABPROT 14.7  --  14.8 13.7  INR 1.17  --  1.19 1.07  HEPARINUNFRC  --  0.44 0.33 0.36  CREATININE 1.74*  --  1.58* 1.41*    Estimated Creatinine Clearance: 36.6 ml/min (by C-G formula based on Cr of 1.41).  Assessment: 73 y/o F on Coumadin PTA for hx of DVT (3 yrs ago), LE doppler neg for DVT. INR down to 1.07 - s/p vit K 5mg  IV 5/3 1000. S/p angiogram 5/5- pt will need fem-fem -plan to d/c and bring back next week to do. Pt continues on heparin bridge. HL 0.36 on gtt at 1200 units/hr. Hgb low but stable - will watch. plt ok. No bleeding noted. Coumadin PTA dose: 6mg  on MWF, 5mg  on TTSS, last dose 4/30  Goal of Therapy:  Heparin level 0.3-0.7 units/ml Monitor platelets by anticoagulation protocol: Yes   Plan:  -Continue heparin 1200 units/hr -Daily CBC/HL -Monitor for bleeding -F/u restart coumadin when able  Sherlon Handing, PharmD, BCPS Clinical pharmacist, pager [phone] 07/26/2013,10:37 AM

## 2013-07-27 ENCOUNTER — Encounter (HOSPITAL_COMMUNITY)
Admission: EM | Disposition: A | Discharge status: Home / Self Care | Payer: Self-pay | Source: Home / Self Care | Attending: Vascular Surgery

## 2013-07-27 DIAGNOSIS — I10 Essential (primary) hypertension: Secondary | ICD-10-CM

## 2013-07-27 DIAGNOSIS — N189 Chronic kidney disease, unspecified: Secondary | ICD-10-CM | POA: Diagnosis not present

## 2013-07-27 DIAGNOSIS — I2510 Atherosclerotic heart disease of native coronary artery without angina pectoris: Secondary | ICD-10-CM | POA: Diagnosis not present

## 2013-07-27 DIAGNOSIS — N179 Acute kidney failure, unspecified: Secondary | ICD-10-CM | POA: Diagnosis not present

## 2013-07-27 DIAGNOSIS — E875 Hyperkalemia: Secondary | ICD-10-CM

## 2013-07-27 HISTORY — PX: LEFT HEART CATHETERIZATION WITH CORONARY ANGIOGRAM: SHX5451

## 2013-07-27 LAB — CBC
HCT: 30.2 % — ABNORMAL LOW (ref 36.0–46.0)
HEMATOCRIT: 27.4 % — AB (ref 36.0–46.0)
Hemoglobin: 9 g/dL — ABNORMAL LOW (ref 12.0–15.0)
Hemoglobin: 9.6 g/dL — ABNORMAL LOW (ref 12.0–15.0)
MCH: 30.1 pg (ref 26.0–34.0)
MCH: 29.5 pg (ref 26.0–34.0)
MCHC: 32.8 g/dL (ref 30.0–36.0)
MCHC: 31.8 g/dL (ref 30.0–36.0)
MCV: 91.6 fL (ref 78.0–100.0)
MCV: 92.9 fL (ref 78.0–100.0)
PLATELETS: 154 10*3/uL (ref 150–400)
Platelets: 150 10*3/uL (ref 150–400)
RBC: 2.99 MIL/uL — ABNORMAL LOW (ref 3.87–5.11)
RBC: 3.25 MIL/uL — AB (ref 3.87–5.11)
RDW: 15.1 % (ref 11.5–15.5)
RDW: 15 % (ref 11.5–15.5)
WBC: 7.8 10*3/uL (ref 4.0–10.5)
WBC: 7.9 10*3/uL (ref 4.0–10.5)

## 2013-07-27 LAB — CREATININE, SERUM
CREATININE: 1.32 mg/dL — AB (ref 0.50–1.10)
GFR calc Af Amer: 45 mL/min — ABNORMAL LOW (ref >90)
GFR calc non Af Amer: 39 mL/min — ABNORMAL LOW (ref >90)

## 2013-07-27 LAB — GLUCOSE, CAPILLARY
GLUCOSE-CAPILLARY: 63 mg/dL — AB (ref 70–99)
GLUCOSE-CAPILLARY: 163 mg/dL — AB (ref 70–99)
Glucose-Capillary: 124 mg/dL — ABNORMAL HIGH (ref 70–99)
Glucose-Capillary: 88 mg/dL (ref 70–99)

## 2013-07-27 LAB — BASIC METABOLIC PANEL
BUN: 44 mg/dL — AB (ref 6–23)
CO2: 20 meq/L (ref 19–32)
CREATININE: 1.42 mg/dL — AB (ref 0.50–1.10)
Calcium: 8.3 mg/dL — ABNORMAL LOW (ref 8.4–10.5)
Chloride: 104 mEq/L (ref 96–112)
GFR calc Af Amer: 41 mL/min — ABNORMAL LOW (ref >90)
GFR calc non Af Amer: 36 mL/min — ABNORMAL LOW (ref >90)
Glucose, Bld: 54 mg/dL — ABNORMAL LOW (ref 70–99)
POTASSIUM: 5.3 meq/L (ref 3.7–5.3)
Sodium: 135 mEq/L — ABNORMAL LOW (ref 137–147)

## 2013-07-27 LAB — HEPARIN LEVEL (UNFRACTIONATED): HEPARIN UNFRACTIONATED: 0.35 [IU]/mL (ref 0.30–0.70)

## 2013-07-27 LAB — PROTIME-INR
INR: 1.22 (ref 0.00–1.49)
Prothrombin Time: 15.1 seconds (ref 11.6–15.2)

## 2013-07-27 SURGERY — LEFT HEART CATHETERIZATION WITH CORONARY ANGIOGRAM
Anesthesia: LOCAL

## 2013-07-27 MED ORDER — MIDAZOLAM HCL 2 MG/2ML IJ SOLN
INTRAMUSCULAR | Status: AC
  Filled 2013-07-27: qty 2

## 2013-07-27 MED ORDER — HEPARIN (PORCINE) IN NACL 2-0.9 UNIT/ML-% IJ SOLN
INTRAMUSCULAR | Status: AC
  Filled 2013-07-27: qty 500

## 2013-07-27 MED ORDER — VERAPAMIL HCL 2.5 MG/ML IV SOLN
INTRAVENOUS | Status: AC
  Filled 2013-07-27: qty 2

## 2013-07-27 MED ORDER — HEPARIN SODIUM (PORCINE) 1000 UNIT/ML IJ SOLN
INTRAMUSCULAR | Status: AC
  Filled 2013-07-27: qty 1

## 2013-07-27 MED ORDER — HEPARIN (PORCINE) IN NACL 2-0.9 UNIT/ML-% IJ SOLN
INTRAMUSCULAR | Status: AC
  Filled 2013-07-27: qty 1000

## 2013-07-27 MED ORDER — NITROGLYCERIN 0.2 MG/ML ON CALL CATH LAB
INTRAVENOUS | Status: AC
  Filled 2013-07-27: qty 1

## 2013-07-27 MED ORDER — HEPARIN SODIUM (PORCINE) 5000 UNIT/ML IJ SOLN
5000.0000 [IU] | Freq: Three times a day (TID) | INTRAMUSCULAR | Status: DC
  Filled 2013-07-27 (×3): qty 1

## 2013-07-27 MED ORDER — SODIUM CHLORIDE 0.9 % IV SOLN: INTRAVENOUS | Status: AC

## 2013-07-27 MED ORDER — LIDOCAINE HCL (PF) 1 % IJ SOLN
INTRAMUSCULAR | Status: AC
  Filled 2013-07-27: qty 30

## 2013-07-27 NOTE — H&P (View-Only) (Signed)
Subjective: Rt foot better. No history of chest pain.  Does have a hx of significant PVD.  myoview showed moderate anterior wall ischemia.  SDS = 5.  LV function is normal.   Objective: Vital signs in last 24 hours: Temp:  [98.2 F (36.8 C)-98.3 F (36.8 C)] 98.3 F (36.8 C) (05/06 0437) Pulse Rate:  [53-56] 53 (05/06 0437) Resp:  [17] 17 (05/06 0437) BP: (102-158)/(29-96) 142/69 mmHg (05/06 0437) SpO2:  [93 %-95 %] 95 % (05/06 0437) Last BM Date: 07/24/13  Intake/Output from previous day: 05/05 0701 - 05/06 0700 In: 464 [I.V.:464] Out: -  Intake/Output this shift:    Medications Current Facility-Administered Medications  Medication Dose Route Frequency Provider Last Rate Last Dose  . 0.45 % sodium chloride infusion   Intravenous Continuous Elam Dutch, MD 100 mL/hr at 07/24/13 1900    . 0.9 %  sodium chloride infusion   Intravenous Continuous Serafina Mitchell, MD      . acetaminophen (TYLENOL) tablet 325-650 mg  325-650 mg Oral Q4H PRN Serafina Mitchell, MD   650 mg at 07/26/13 1039   Or  . acetaminophen (TYLENOL) suppository 325-650 mg  325-650 mg Rectal Q4H PRN Serafina Mitchell, MD      . albuterol (PROVENTIL) (2.5 MG/3ML) 0.083% nebulizer solution 2.5 mg  2.5 mg Inhalation Q6H PRN Elam Dutch, MD      . alum & mag hydroxide-simeth (MAALOX/MYLANTA) 200-200-20 MG/5ML suspension 15-30 mL  15-30 mL Oral Q2H PRN Serafina Mitchell, MD      . amLODipine (NORVASC) tablet 10 mg  10 mg Oral Daily Elam Dutch, MD   10 mg at 07/26/13 1042  . calcium-vitamin D (OSCAL WITH D) 500-200 MG-UNIT per tablet 1 tablet  1 tablet Oral BID WC Elam Dutch, MD   1 tablet at 07/26/13 1039  . carvedilol (COREG) tablet 25 mg  25 mg Oral BID WC Elam Dutch, MD   25 mg at 07/26/13 1041  . chlorthalidone (HYGROTON) tablet 50 mg  50 mg Oral Daily Elam Dutch, MD   50 mg at 07/26/13 1041  . escitalopram (LEXAPRO) tablet 20 mg  20 mg Oral Daily Elam Dutch, MD   20 mg at  07/26/13 1042  . gabapentin (NEURONTIN) capsule 100 mg  100 mg Oral Daily Elam Dutch, MD   100 mg at 07/26/13 1042   And  . gabapentin (NEURONTIN) capsule 200 mg  200 mg Oral QHS Elam Dutch, MD   200 mg at 07/25/13 2143  . glipiZIDE (GLUCOTROL XL) 24 hr tablet 10 mg  10 mg Oral Q breakfast Elam Dutch, MD   10 mg at 07/26/13 1041  . guaiFENesin-dextromethorphan (ROBITUSSIN DM) 100-10 MG/5ML syrup 15 mL  15 mL Oral Q4H PRN Serafina Mitchell, MD      . heparin ADULT infusion 100 units/mL (25000 units/250 mL)  1,200 Units/hr Intravenous Continuous Elam Dutch, MD 12 mL/hr at 07/26/13 0027 1,200 Units/hr at 07/26/13 0027  . labetalol (NORMODYNE,TRANDATE) injection 10 mg  10 mg Intravenous Q2H PRN Serafina Mitchell, MD       Or  . hydrALAZINE (APRESOLINE) injection 10 mg  10 mg Intravenous Q2H PRN Serafina Mitchell, MD      . insulin aspart (novoLOG) injection 0-20 Units  0-20 Units Subcutaneous TID WC Elam Dutch, MD   3 Units at 07/23/13 1229  . insulin glargine (LANTUS) injection 13 Units  13 Units Subcutaneous QHS Elam Dutch, MD   13 Units at 07/25/13 2144  . metoprolol (LOPRESSOR) injection 2-5 mg  2-5 mg Intravenous Q2H PRN Serafina Mitchell, MD      . ondansetron [facility]) injection 4 mg  4 mg Intravenous Q6H PRN Serafina Mitchell, MD      . phenol (CHLORASEPTIC) mouth spray 1 spray  1 spray Mouth/Throat PRN Serafina Mitchell, MD      . pioglitazone (ACTOS) tablet 45 mg  45 mg Oral Q breakfast Elam Dutch, MD   45 mg at 07/26/13 1039  . tiotropium (SPIRIVA) inhalation capsule 18 mcg  18 mcg Inhalation Daily Elam Dutch, MD   18 mcg at 07/26/13 0931    PE: Physical Exam   Constitutional: She is oriented to person, place, and time. She appears well-developed. No distress.  Obese  Eyes: EOM are normal. Pupils are equal, round, and reactive to light. No scleral icterus.  Neck: Normal range of motion.   Cardiovascular: Normal rate, regular rhythm, S1 normal and S2  normal.  Murmur heard.  Systolic murmur is present with a grade of 2/6  Pulses:  Radial pulses are 2+ on the right side, and 2+ on the left side.  Dorsalis pedis pulses are 1+ on the right side, and 2+ on the left side.  Posterior tibial pulses are 1+ on the right side, and 2+ on the left side.  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  Respiratory: Effort normal. She has wheezes (right side). She has no rales.  Musculoskeletal: She exhibits edema (Trace).  Neurological: She is alert and oriented to person, place, and time. She exhibits normal muscle tone.  Skin: Skin is warm and dry.  Left foot with ecchymosis/purple    Lab Results:   Recent Labs  07/24/13 0535 07/25/13 0427 07/26/13 0745  WBC 7.9 7.8 7.9  HGB 9.7* 9.4* 9.9*  HCT 30.7* 29.6* 30.6*  PLT 152 170 156   BMET  Recent Labs  07/24/13 0535 07/25/13 0427 07/26/13 0745  NA 136* 135* 137  K 5.7* 5.5* 5.3  CL 104 102 104  CO2 21 21 21   GLUCOSE 74 104* 75  BUN 49* 47* 43*  CREATININE 1.74* 1.58* 1.41*  CALCIUM 8.0* 8.2* 8.6   PT/INR  Recent Labs  07/24/13 0535 07/25/13 0427 07/26/13 0745  LABPROT 14.7 14.8 13.7  INR 1.17 1.19 1.07   Cholesterol No results found for this basename: CHOL,  in the last 72 hours Cardiac Enzymes No components found with this basename: TROPONIN,  CK[facility],   Studies/Results: Myoview- 07/25/13- IMPRESSION: 1. Positive for moderate focus of inducible ischemia in the mid ventricular anterior wall extending toward the apex. 2. Normal cardiac wall motion. 3. Calculated ejection fraction 66%. These results will be called to the ordering clinician or representative by the Radiologist Assistant, and communication documented in the PACS Dashboard.      Assessment/Plan  The patient is a 73 yo female with a history of PAD with prior left CEA, OSA-CPAP, DM, HTN, renal disorder, gallstone, DVT, arthritis. She presented with left foot pain  and non-healing wounds. ABI's are 0.2 on the left and 0.4 on the right. Cardiomegaly on CXR. The patient reports developing foot problems with in the last six weeks. She had LEA dopplers back in April in [place] with no apparent concerns. She's had worsening LE pain and nonhealing ulcers particularly in the left leg.  The patient currently denies nausea, vomiting, fever, chest pain, shortness of breath, orthopnea, dizziness, PND, cough, congestion, abdominal pain, hematochezia, melena.   Active Problems:  1. PAD (peripheral artery disease)  History of left carotid endarterectomy and new low extremity disease. Left ABI 0.2, right 0.4. PV angiogram 07/26/13- unsuccessful attempt at recannulization of the left common iliac artery occlusion, successful stenting of the high-grade stenosis involving the distal abdominal aorta and proximal right common iliac artery using a atrium 8 x 38 covered stent.   2. Pre Op Clearance  Cardiomegaly on CXR. RBBB on EKG. 2D echo Interpretation to follow, Myoview abnormal with mid- anterior wall ischemia.  For cath tomorrow.   3. Hyperkalemia   Still 5.5Continue to monitor- hold potassium   4. HTN  She is on amlodipine, coreg 25 bid, Chlorthalidone. BP ok. Continue to monitor.   5. Acute on chronic kidney disease.  SCr improved to 1.41    Plan-   LOS: 5 days    Erlene Quan PA-C 07/26/2013 10:53 AM  Attending Note:   The patient was seen and examined.  Agree with assessment and plan as noted above.  Changes made to the above note as needed.  I have reviewed her Myoview. She has a moderate ischemia along the entire anterior wall. Left ventricle systolic function is normal. Her the contractility of the wall is normal.  Given her significant peripheral vascular history, I think that it will be important to define her coronary anatomy before she undergoes a major vascular operation. We'll schedule her for cardiac cath  tomorrow.  Thayer Headings,  Brooke Bonito., MD, [facility] 07/26/2013, 11:18 AM

## 2013-07-27 NOTE — Progress Notes (Signed)
Hypoglycemic Event  CBG: 63, Labs glucose 54  Treatment: 15 GM gel  Symptoms: None, asymptomatic  Follow-up CBG: Time:0705 CBG Result:124  Possible Reasons for Event: Inadequate meal intake and Medication regimen: Lantus 13 units qhs, patient NPO after midnight.   Comments/MD notified: Patient asymptomatic, asleep, easily arousable.     Clovis Riley, RN  Remember to initiate Hypoglycemia Order Set & complete

## 2013-07-27 NOTE — Discharge Summary (Signed)
[facility] Discharge Summary  Natalie Porter 01-25-1941 73 y.o. female  [id number]  Admission Date: 07/21/2013  Discharge Date: 07/27/13  Physician: Elam Dutch, MD  Admission Diagnosis: Foot pain [729.5] PAD (peripheral artery disease) [443.9]   HPI:   This is a 73 y.o. female who presents for evaluation of left foot pain with non healing wounds on left foot. She was seen by her primary MD for this problem approximately 6 weeks ago. Approximately 30 pages of medical records and imaging from her prior visits there. A venous duplex was obtained which showed no DVT. 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 She had ABI which were 0.2 on the left and 0.4 on the right. The patient was waiting on an appt at [facility] in May but got tired of waiting and came to the ER at [facility] tonight due to worsening left foot pain. She has primarily gotten around with a motorized wheelchair but up to 6 weeks ago she was able to get around the house with a walker fairly independently. She has not been able to ambulate independently for the last 6 weeks. She developed 2 small ulcers on the left toes about 6 months ago. She has developed redness of the foot and and new dorsal foot wound over the last 6 weeks. Other medical problems include diabetes, hypertension, renal dysfunction, PAD with prior left CEA at [facility] years ago, DVT 3 years ago and has been on coumadin since that time. All of these problems are currently stable.  Hospital Course:  The patient was admitted to the hospital for pain control and her coumadin transitioned to heparin.  She was given Vit K to reverse her coumadin. By the next morning, her foot pain was better.  She did have protein calorie malnutrition with albumin of 2.5 and a nutrition consult was obtained.  She did have mild renal insufficiency and was hydrated prior to her  angiogram.  She had bilateral venous dopplers and no DVT or SVT were noted.  Her ABI's preoperatively were as follows:  RIGHT    LEFT     PRESSURE  WAVEFORM   PRESSURE  WAVEFORM   BRACHIAL  180  T  BRACHIAL  187  T   DP    DP     AT  177  M  AT  142  DM   PT  36  M  PT  86  DM   PER    PER     GREAT TOE   NA  GREAT TOE   NA     RIGHT  LEFT   ABI  0.95  0.76    Carotid dopplers were also obtained and and were 1-39% stenosis (highest end of range) and vertebral artery flow is antegrade.  During her stay, a cardiology consult was obtained for pre operative clearance.  An echo was obtained with the following results: Study Conclusions  - Left ventricle: E/e'>10 suggestive of elevated filling pressures The cavity size was normal. Systolic function was normal. The estimated ejection fraction was in the range of 60% to 65%. Wall motion was normal; there were no regional wall motion abnormalities. - Aortic valve: Moderate diffuse thickening and calcification, consistent with sclerosis. Mild regurgitation. - Left atrium: The atrium was mildly dilated.  On 07/25/13, she was taken to the [facility] lab and underwent: 1. ultrasound-guided access, right femoral artery  2. ultrasound-guided access, left  femoral artery  3. abdominal aortogram  4. bilateral lower extremity runoff  5. stent, abdominal aorta  6. stent, right common iliac artery  By the morning, her right foot pain was dramatically improved.  A Lexiscan was obitained with the following results: 1. Positive for moderate focus of inducible ischemia in the mid ventricular anterior wall extending toward the apex. 2. Normal cardiac wall motion. 3. Calculated ejection fraction 66%.  She was felt to be at high risk for femoral to femoral bypass grafting and then underwent cardiac catheterization on 07/27/13.  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  She was discharged home on  Thursday to return on Monday for femoral to femoral bypass grafting.  Her creatinine had improved to 1.4 at discharge.  The remainder of the hospital course consisted of increasing mobilization and increasing intake of solids without difficulty.  CBC    Component Value Date/Time   WBC 7.8 07/27/2013 0505   RBC 2.99* 07/27/2013 0505   HGB 9.0* 07/27/2013 0505   HCT 27.4* 07/27/2013 0505   PLT 150 07/27/2013 0505   MCV 91.6 07/27/2013 0505   MCH 30.1 07/27/2013 0505   MCHC 32.8 07/27/2013 0505   RDW 15.1 07/27/2013 0505    BMET    Component Value Date/Time   NA 135* 07/27/2013 0505   K 5.3 07/27/2013 0505   CL 104 07/27/2013 0505   CO2 20 07/27/2013 0505   GLUCOSE 54* 07/27/2013 0505   BUN 44* 07/27/2013 0505   CREATININE 1.42* 07/27/2013 0505   CALCIUM 8.3* 07/27/2013 0505   GFRNONAA 36* 07/27/2013 0505   GFRAA 41* 07/27/2013 0505     Discharge Instructions:   The patient is discharged to home with extensive instructions on wound care and progressive ambulation.  They are instructed not to drive or perform any heavy lifting until returning to see the physician in his office.  Discharge Orders   Future Orders Complete By Expires   Call MD for:  redness, tenderness, or signs of infection (pain, swelling, bleeding, redness, odor or green/yellow discharge around incision site)  As directed    Call MD for:  severe or increased pain, loss or decreased feeling  in affected limb(s)  As directed    Call MD for:  temperature >100.5  As directed    Discharge instructions  As directed    Increase activity slowly  As directed    May shower   As directed    Resume previous diet  As directed       Discharge Diagnosis:  Foot pain [729.5] PAD (peripheral artery disease) [443.9]  Secondary Diagnosis: Patient Active Problem List   Diagnosis Date Noted  . PAD (peripheral artery disease) 07/21/2013   Past Medical History  Diagnosis Date  . Diabetes mellitus without complication   . Hypertension   . Renal  disorder   . Gallstone   . Arthritis   . DVT (deep venous thrombosis)        Medication List    STOP taking these medications       warfarin 5 MG tablet  Commonly known as:  COUMADIN     warfarin 6 MG tablet  Commonly known as:  COUMADIN      TAKE these medications       acetaminophen 500 MG tablet  Commonly known as:  TYLENOL  Take 500 mg by mouth every 6 (six) hours as needed.     albuterol 108 (90 BASE)  MCG/ACT inhaler  Commonly known as:  PROVENTIL HFA;VENTOLIN HFA  Inhale 2 puffs into the lungs every 6 (six) hours as needed for wheezing or shortness of breath.     amLODipine 10 MG tablet  Commonly known as:  NORVASC  Take 10 mg by mouth daily.     calcium-vitamin D 250-125 MG-UNIT per tablet  Commonly known as:  OSCAL  Take 1 tablet by mouth 2 (two) times daily.     carvedilol 25 MG tablet  Commonly known as:  COREG  Take 25 mg by mouth 2 (two) times daily with a meal.     chlorthalidone 50 MG tablet  Commonly known as:  HYGROTON  Take 50 mg by mouth daily.     escitalopram 20 MG tablet  Commonly known as:  LEXAPRO  Take 20 mg by mouth daily.     gabapentin 100 MG capsule  Commonly known as:  NEURONTIN  Take 100 mg by mouth 2 (two) times daily. 100 mg in the morning and 200 mg at bedtime     glipiZIDE 5 MG 24 hr tablet  Commonly known as:  GLUCOTROL XL  Take 10 mg by mouth daily with breakfast.     HYDROcodone-acetaminophen 10-325 MG per tablet  Commonly known as:  NORCO  Take 1 tablet by mouth every 6 (six) hours as needed.     insulin glargine 100 UNIT/ML injection  Commonly known as:  LANTUS  Inject 13 Units into the skin at bedtime.     pioglitazone 45 MG tablet  Commonly known as:  ACTOS  Take 45 mg by mouth daily.     TUDORZA PRESSAIR 400 MCG/ACT Aepb  Generic drug:  Aclidinium Bromide  Inhale 1 puff into the lungs every 12 (twelve) hours.        Disposition: home  Patient's condition: is Good  Follow up: 1.pt to have femoral  to femoral bypass on Monday, Jul 31, 2013   Leontine Locket, [place] [facility] [phone] 07/27/2013  1:41 PM

## 2013-07-27 NOTE — Interval H&P Note (Signed)
Cath Lab Visit (complete for each Cath Lab visit)  Clinical Evaluation Leading to the Procedure:   ACS: no  Non-ACS:    Anginal Classification: CCS I  Anti-ischemic medical therapy: Maximal Therapy (2 or more classes of medications)  Non-Invasive Test Results: High-risk stress test findings: cardiac mortality >3%/year  Prior CABG: No previous CABG      History and Physical Interval Note:  07/27/2013 11:18 AM  Natalie Porter  has presented today for surgery, with the diagnosis of cp  The various methods of treatment have been discussed with the patient and family. After consideration of risks, benefits and other options for treatment, the patient has consented to  Procedure(s): LEFT HEART CATHETERIZATION WITH CORONARY ANGIOGRAM (N/A) as a surgical intervention .  The patient's history has been reviewed, patient examined, no change in status, stable for surgery.  I have reviewed the patient's chart and labs.  Questions were answered to the patient's satisfaction.     Minus Breeding

## 2013-07-27 NOTE — Progress Notes (Addendum)
[facility] [place]  Subjective  - Going to OR today for cardiac catheterization.  Myoview abnormal with mid- anterior wall ischemia.  Right foot feels better.    Objective 148/68 54 97.9 F (36.6 C) (Oral) 17 92%  Intake/Output Summary (Last 24 hours) at 07/27/13 0751 Last data filed at 07/26/13 2200  Gross per 24 hour  Intake    332 ml  Output    150 ml  Net    182 ml   Groins soft without hematoma Doppler signal bilateral DP  Right biphasic, left monophasic.    Assessment/Planning: POD #2 Procedure Performed:  1. ultrasound-guided access, right femoral artery  2. ultrasound-guided access, left femoral artery  3. abdominal aortogram  4. bilateral lower extremity runoff  5. stent, abdominal aorta  6. stent, right common iliac artery  Cardiac cath today Mon. 07/31/2013 femoral to femoral bypass by Dr. Oneida Alar.    Ulyses Amor 07/27/2013 7:51 AM -- Right foot warm Left foot stable no decline Fem fem vs reattempt at left iliac stent on Monday pending cath results today  Ruta Hinds, MD [facility] [place]: [phone] Pager: [phone]  Laboratory Lab Results:  Recent Labs  07/26/13 0745 07/27/13 0505  WBC 7.9 7.8  HGB 9.9* 9.0*  HCT 30.6* 27.4*  PLT 156 150   BMET  Recent Labs  07/26/13 0745 07/27/13 0505  NA 137 135*  K 5.3 5.3  CL 104 104  CO2 21 20  GLUCOSE 75 54*  BUN 43* 44*  CREATININE 1.41* 1.42*  CALCIUM 8.6 8.3*    COAG Lab Results  Component Value Date   INR 1.22 07/27/2013   INR 1.07 07/26/2013   INR 1.19 07/25/2013   No results found for this basename: PTT

## 2013-07-27 NOTE — Progress Notes (Signed)
ANTICOAGULATION CONSULT NOTE - Follow Up Consult  Pharmacy Consult for Heparin  Indication: History of DVT (warfarin on hold for procedure)  Allergies  Allergen Reactions  . Codeine Nausea Only  . Morphine And Related Nausea And Vomiting    Patient Measurements: Height: 5\' 2"  (157.5 cm) Weight: 196 lb 6.9 oz (89.1 kg) IBW/kg (Calculated) : 50.1  Vital Signs: Temp: 97.9 F (36.6 C) (05/07 0621) Temp src: Oral (05/07 0621) BP: 148/68 mmHg (05/07 0621) Pulse Rate: 54 (05/07 0621)  Labs:  Recent Labs  07/25/13 0427 07/26/13 0745 07/27/13 0505  HGB 9.4* 9.9* 9.0*  HCT 29.6* 30.6* 27.4*  PLT 170 156 150  LABPROT 14.8 13.7 15.1  INR 1.19 1.07 1.22  HEPARINUNFRC 0.33 0.36 0.35  CREATININE 1.58* 1.41* 1.42*    Estimated Creatinine Clearance: 36.6 ml/min (by C-G formula based on Cr of 1.42).  Assessment: 73 y/o F on Coumadin PTA for hx of DVT (3 yrs ago), LE doppler neg for DVT. INR down to 1.22 - s/p vit K 5mg  IV 5/3 1000. S/p angiogram 5/5- pt will need fem-fem -if cath is negative today, plan to d/c and bring back next week to do surgery. Pt continues on therapeutic heparin bridge. Hgb low but stable - will watch. plt ok. No bleeding noted. Coumadin PTA dose: 6mg  on MWF, 5mg  on TTSS, last dose 4/30  Goal of Therapy:  Heparin level 0.3-0.7 units/ml Monitor platelets by anticoagulation protocol: Yes   Plan:  -Continue heparin 1200 units/hr -Daily CBC/HL -Monitor for bleeding -F/u post cath  Sherlon Handing, PharmD, BCPS Clinical pharmacist, pager [phone] 07/27/2013,9:59 AM

## 2013-07-27 NOTE — CV Procedure (Signed)
   Cardiac Catheterization Procedure Note  Name: Manasi Dishon MRN: [id number] DOB: 04/12/1940  Procedure: Left Heart Cath, Selective Coronary Angiography, LV angiography  Indication:  Abnormal stress test  Procedural details: The right radial was prepped, draped, and anesthetized with 1% lidocaine. Using modified Seldinger technique, a 5 French sheath was introduced into the right radial artery. Standard Judkins catheters were used for coronary angiography . Catheter exchanges were performed over a guidewire. There were no immediate procedural complications. The patient was transferred to the post catheterization recovery area for further monitoring.  Procedural Findings:  Hemodynamics:     AO 156/44    LV [id number]   Coronary angiography:   Coronary dominance: Right  Left mainstem:   Short and normal  Left anterior descending (LAD):   Moderate calcification.  Mid calcified 50% at the take off of a moderate sized diagonal.  Long mid 30%.  Long distal 40%.  D2 is moderate sized with luminal irregularities.    Left circumflex (LCx):  Luminal AV groove irregularities. 60% after MOM.  MOM moderate sized with mild luminal irregularities.  MOM2 moderate with luminal irregularities.  PL small and normal  Right coronary artery (RCA):  Dominant but small.  Proximal 70%.  Diffuse luminal irregularities.  PDA small and mild diffuse disease.   Left ventriculography: No LV gram secondary to RI  Final Conclusions:    Nonobstructive CAD  Recommendations:   No intervention is indicated prior to vascular surgery.    Minus Breeding 07/27/2013, 12:08 PM

## 2013-07-27 NOTE — Progress Notes (Signed)
DC IV and tele per protocol; DC instructions reviewed and signed by pts daughter; no further questions from pt or family; radial site looks the same-bruising-cath lab RN came and looked at site-RN stated site looked stable.   Carollee Sires, RN

## 2013-07-27 NOTE — Progress Notes (Addendum)
Subjective: Feeling better.  Right leg improved.  Objective: Vital signs in last 24 hours: Temp:  [97.9 F (36.6 C)-99.2 F (37.3 C)] 97.9 F (36.6 C) (05/07 0621) Pulse Rate:  [54-56] 54 (05/07 0621) Resp:  [17-18] 17 (05/07 0621) BP: (120-148)/(68-88) 148/68 mmHg (05/07 0621) SpO2:  [92 %-96 %] 92 % (05/07 0621) Weight:  [196 lb 6.9 oz (89.1 kg)] 196 lb 6.9 oz (89.1 kg) (05/07 0621) Last BM Date: 07/24/13  Intake/Output from previous day: 05/06 0701 - 05/07 0700 In: 332 [I.V.:332] Out: 150 [Urine:150] Intake/Output this shift:    Medications Current Facility-Administered Medications  Medication Dose Route Frequency Provider Last Rate Last Dose  . 0.45 % sodium chloride infusion   Intravenous Continuous Elam Dutch, MD 100 mL/hr at 07/24/13 1900    . 0.9 %  sodium chloride infusion   Intravenous Continuous Serafina Mitchell, MD      . 0.9 %  sodium chloride infusion  250 mL Intravenous PRN Thayer Headings, MD      . 0.9 %  sodium chloride infusion   Intravenous Continuous Thayer Headings, MD 75 mL/hr at 07/27/13 0405 1,000 mL at 07/27/13 0405  . acetaminophen (TYLENOL) tablet 325-650 mg  325-650 mg Oral Q4H PRN Serafina Mitchell, MD   650 mg at 07/27/13 0530   Or  . acetaminophen (TYLENOL) suppository 325-650 mg  325-650 mg Rectal Q4H PRN Serafina Mitchell, MD      . albuterol (PROVENTIL) (2.5 MG/3ML) 0.083% nebulizer solution 2.5 mg  2.5 mg Inhalation Q6H PRN Elam Dutch, MD      . alum & mag hydroxide-simeth (MAALOX/MYLANTA) 200-200-20 MG/5ML suspension 15-30 mL  15-30 mL Oral Q2H PRN Serafina Mitchell, MD      . amLODipine (NORVASC) tablet 10 mg  10 mg Oral Daily Elam Dutch, MD   10 mg at 07/26/13 1042  . calcium-vitamin D (OSCAL WITH D) 500-200 MG-UNIT per tablet 1 tablet  1 tablet Oral BID WC Elam Dutch, MD   1 tablet at 07/26/13 1610  . carvedilol (COREG) tablet 25 mg  25 mg Oral BID WC Elam Dutch, MD   25 mg at 07/26/13 1610  . cefUROXime  (ZINACEF) 1.5 g in dextrose 5 % 50 mL IVPB  1.5 g Intravenous 30 min Pre-Op Elam Dutch, MD      . chlorhexidine (HIBICLENS) 4 % liquid 4 application  60 mL Topical Once Elam Dutch, MD       And  . chlorhexidine (HIBICLENS) 4 % liquid 4 application  60 mL Topical Once Elam Dutch, MD      . Chlorhexidine Gluconate Cloth 2 % PADS 6 each  6 each Topical Once Elam Dutch, MD      . chlorthalidone (HYGROTON) tablet 50 mg  50 mg Oral Daily Elam Dutch, MD   50 mg at 07/26/13 1041  . escitalopram (LEXAPRO) tablet 20 mg  20 mg Oral Daily Elam Dutch, MD   20 mg at 07/26/13 1042  . gabapentin (NEURONTIN) capsule 100 mg  100 mg Oral Daily Elam Dutch, MD   100 mg at 07/26/13 1042   And  . gabapentin (NEURONTIN) capsule 200 mg  200 mg Oral QHS Elam Dutch, MD   200 mg at 07/26/13 2158  . glipiZIDE (GLUCOTROL XL) 24 hr tablet 10 mg  10 mg Oral Q breakfast Elam Dutch, MD   10 mg at  07/26/13 1041  . guaiFENesin-dextromethorphan (ROBITUSSIN DM) 100-10 MG/5ML syrup 15 mL  15 mL Oral Q4H PRN Serafina Mitchell, MD      . heparin ADULT infusion 100 units/mL (25000 units/250 mL)  1,200 Units/hr Intravenous Continuous Elam Dutch, MD 12 mL/hr at 07/26/13 2200 1,200 Units/hr at 07/26/13 2200  . labetalol (NORMODYNE,TRANDATE) injection 10 mg  10 mg Intravenous Q2H PRN Serafina Mitchell, MD       Or  . hydrALAZINE (APRESOLINE) injection 10 mg  10 mg Intravenous Q2H PRN Serafina Mitchell, MD      . insulin aspart (novoLOG) injection 0-20 Units  0-20 Units Subcutaneous TID WC Elam Dutch, MD   3 Units at 07/23/13 1229  . insulin glargine (LANTUS) injection 13 Units  13 Units Subcutaneous QHS Elam Dutch, MD   13 Units at 07/26/13 2203  . metoprolol (LOPRESSOR) injection 2-5 mg  2-5 mg Intravenous Q2H PRN Serafina Mitchell, MD      . ondansetron [facility]) injection 4 mg  4 mg Intravenous Q6H PRN Serafina Mitchell, MD      . oxyCODONE (Oxy IR/ROXICODONE) immediate release  tablet 5 mg  5 mg Oral Q4H PRN Ulyses Amor, PA-C   5 mg at 07/27/13 0530  . phenol (CHLORASEPTIC) mouth spray 1 spray  1 spray Mouth/Throat PRN Serafina Mitchell, MD      . pioglitazone (ACTOS) tablet 45 mg  45 mg Oral Q breakfast Elam Dutch, MD   45 mg at 07/26/13 1039  . sodium chloride 0.9 % injection 3 mL  3 mL Intravenous Q12H Thayer Headings, MD      . sodium chloride 0.9 % injection 3 mL  3 mL Intravenous PRN Thayer Headings, MD      . tiotropium [facility]) inhalation capsule 18 mcg  18 mcg Inhalation Daily Elam Dutch, MD   18 mcg at 07/26/13 7124    PE: General appearance: alert, cooperative and no distress Lungs: clear to auscultation bilaterally Heart: regular rate and rhythm Extremities: 1+ pedal edema Pulses: 2+ radials, 1+ DPs, PTs Skin: Left leg: large area on erythema and slow healing wound on dorsal surface. Neurologic: Grossly normal  Lab Results:   Recent Labs  07/25/13 0427 07/26/13 0745 07/27/13 0505  WBC 7.8 7.9 7.8  HGB 9.4* 9.9* 9.0*  HCT 29.6* 30.6* 27.4*  PLT 170 156 150   BMET  Recent Labs  07/25/13 0427 07/26/13 0745 07/27/13 0505  NA 135* 137 135*  K 5.5* 5.3 5.3  CL 102 104 104  CO2 21 21 20   GLUCOSE 104* 75 54*  BUN 47* 43* 44*  CREATININE 1.58* 1.41* 1.42*  CALCIUM 8.2* 8.6 8.3*   PT/INR  Recent Labs  07/25/13 0427 07/26/13 0745 07/27/13 0505  LABPROT 14.8 13.7 15.1  INR 1.19 1.07 1.22      Assessment/Plan  Active Problems:  1. PAD (peripheral artery disease)  History of left carotid endarterectomy and new low extremity disease. Left ABI 0.2, right 0.4. PV angiogram 07/26/13- unsuccessful attempt at recannulization of the left common iliac artery occlusion, successful stenting of the high-grade stenosis involving the distal abdominal aorta and proximal right common iliac artery using a atrium 8 x 38 covered stent.  2. Pre Op Clearance  Cardiomegaly on CXR. RBBB on EKG. 2D echo: EF 60-65%, Normal wall motion,  Myoview abnormal with mid- anterior wall ischemia.  For cath today.  3. Hyperkalemia  Still 5.3.  Continue to  monitor- hold potassium  4. HTN  She is on amlodipine, coreg 25 bid, Chlorthalidone. BP ok. Continue to monitor.  5. Acute on chronic kidney disease.  SCr stable at 1.42  6. Anemia   Stable    LOS: 6 days    Tarri Fuller PA-C 07/27/2013 8:20 AM  Attending Note:   The patient was seen and examined.  Agree with assessment and plan as noted above.  Changes made to the above note as needed.  Pt is currently in the cath lab.  I examined her in the recovery area She has mild - moderate irregularities , mainly in her RCA.  She can go home today.   She is scheduled for surgery next week with Dr. Oneida Alar.  She is at low risk from a cardiac standpoint .    She has not had any cp.    Thayer Headings, Brooke Bonito., MD, [facility] 07/27/2013, 11:19 AM

## 2013-07-27 NOTE — Progress Notes (Signed)
Inpatient Diabetes Program Recommendations  AACE/ADA: New Consensus Statement on Inpatient Glycemic Control (2013)  Target Ranges:  Prepandial:   less than 140 mg/dL      Peak postprandial:   less than 180 mg/dL (1-2 hours)      Critically ill patients:  140 - 180 mg/dL    Inpatient Diabetes Program Recommendations Insulin - Basal: Please consider decrease in levemir dose to 10 units at this time.  Fasting glucose levels slightly low yesterday and hypoglycemia down to 50's today. Correction (SSI): Rather than oral agents, please use a sensitive correction tidwc. Oral Agents: Please don't use the glipizide and actos while in the hospital.  PO intake is unpredictable and can cause hypo if not eating well.  Thank you, Rosita Kea, RN, CNS, Diabetes Coordinator [phone])

## 2013-07-28 ENCOUNTER — Encounter (HOSPITAL_COMMUNITY): Payer: Self-pay | Admitting: *Deleted

## 2013-07-30 ENCOUNTER — Inpatient Hospital Stay (HOSPITAL_COMMUNITY)
Admission: EM | Admit: 2013-07-30 | Discharge: 2013-08-10 | DRG: 253 | Disposition: A | Discharge status: Home / Self Care | Payer: Medicare Other | Attending: Internal Medicine | Admitting: Internal Medicine

## 2013-07-30 ENCOUNTER — Other Ambulatory Visit: Payer: Self-pay

## 2013-07-30 ENCOUNTER — Encounter (HOSPITAL_COMMUNITY): Payer: Self-pay | Admitting: Emergency Medicine

## 2013-07-30 ENCOUNTER — Emergency Department (HOSPITAL_COMMUNITY): Payer: Medicare Other

## 2013-07-30 DIAGNOSIS — M79609 Pain in unspecified limb: Secondary | ICD-10-CM | POA: Diagnosis present

## 2013-07-30 DIAGNOSIS — I2510 Atherosclerotic heart disease of native coronary artery without angina pectoris: Secondary | ICD-10-CM | POA: Diagnosis present

## 2013-07-30 DIAGNOSIS — I5033 Acute on chronic diastolic (congestive) heart failure: Principal | ICD-10-CM | POA: Diagnosis present

## 2013-07-30 DIAGNOSIS — Z87891 Personal history of nicotine dependence: Secondary | ICD-10-CM | POA: Diagnosis not present

## 2013-07-30 DIAGNOSIS — E46 Unspecified protein-calorie malnutrition: Secondary | ICD-10-CM | POA: Diagnosis present

## 2013-07-30 DIAGNOSIS — I509 Heart failure, unspecified: Secondary | ICD-10-CM | POA: Diagnosis present

## 2013-07-30 DIAGNOSIS — D649 Anemia, unspecified: Secondary | ICD-10-CM | POA: Diagnosis present

## 2013-07-30 DIAGNOSIS — Z86718 Personal history of other venous thrombosis and embolism: Secondary | ICD-10-CM

## 2013-07-30 DIAGNOSIS — I130 Hypertensive heart and chronic kidney disease with heart failure and stage 1 through stage 4 chronic kidney disease, or unspecified chronic kidney disease: Secondary | ICD-10-CM | POA: Diagnosis present

## 2013-07-30 DIAGNOSIS — Z6841 Body Mass Index (BMI) 40.0 and over, adult: Secondary | ICD-10-CM | POA: Diagnosis not present

## 2013-07-30 DIAGNOSIS — J811 Chronic pulmonary edema: Secondary | ICD-10-CM | POA: Diagnosis not present

## 2013-07-30 DIAGNOSIS — K5900 Constipation, unspecified: Secondary | ICD-10-CM | POA: Diagnosis present

## 2013-07-30 DIAGNOSIS — I743 Embolism and thrombosis of arteries of the lower extremities: Secondary | ICD-10-CM | POA: Diagnosis not present

## 2013-07-30 DIAGNOSIS — N189 Chronic kidney disease, unspecified: Secondary | ICD-10-CM

## 2013-07-30 DIAGNOSIS — I739 Peripheral vascular disease, unspecified: Secondary | ICD-10-CM | POA: Diagnosis present

## 2013-07-30 DIAGNOSIS — L97409 Non-pressure chronic ulcer of unspecified heel and midfoot with unspecified severity: Secondary | ICD-10-CM | POA: Diagnosis present

## 2013-07-30 DIAGNOSIS — J4489 Other specified chronic obstructive pulmonary disease: Secondary | ICD-10-CM | POA: Diagnosis not present

## 2013-07-30 DIAGNOSIS — L97209 Non-pressure chronic ulcer of unspecified calf with unspecified severity: Secondary | ICD-10-CM | POA: Diagnosis not present

## 2013-07-30 DIAGNOSIS — E785 Hyperlipidemia, unspecified: Secondary | ICD-10-CM | POA: Diagnosis present

## 2013-07-30 DIAGNOSIS — E119 Type 2 diabetes mellitus without complications: Secondary | ICD-10-CM | POA: Diagnosis present

## 2013-07-30 DIAGNOSIS — N183 Chronic kidney disease, stage 3 unspecified: Secondary | ICD-10-CM | POA: Diagnosis present

## 2013-07-30 DIAGNOSIS — I10 Essential (primary) hypertension: Secondary | ICD-10-CM

## 2013-07-30 DIAGNOSIS — I1310 Hypertensive heart and chronic kidney disease without heart failure, with stage 1 through stage 4 chronic kidney disease, or unspecified chronic kidney disease: Secondary | ICD-10-CM | POA: Diagnosis not present

## 2013-07-30 DIAGNOSIS — E875 Hyperkalemia: Secondary | ICD-10-CM | POA: Diagnosis not present

## 2013-07-30 DIAGNOSIS — I5031 Acute diastolic (congestive) heart failure: Secondary | ICD-10-CM | POA: Diagnosis not present

## 2013-07-30 DIAGNOSIS — E876 Hypokalemia: Secondary | ICD-10-CM | POA: Diagnosis present

## 2013-07-30 DIAGNOSIS — E872 Acidosis, unspecified: Secondary | ICD-10-CM | POA: Diagnosis not present

## 2013-07-30 DIAGNOSIS — M129 Arthropathy, unspecified: Secondary | ICD-10-CM | POA: Diagnosis present

## 2013-07-30 DIAGNOSIS — R319 Hematuria, unspecified: Secondary | ICD-10-CM | POA: Diagnosis not present

## 2013-07-30 DIAGNOSIS — K219 Gastro-esophageal reflux disease without esophagitis: Secondary | ICD-10-CM | POA: Diagnosis present

## 2013-07-30 DIAGNOSIS — R0602 Shortness of breath: Secondary | ICD-10-CM | POA: Diagnosis not present

## 2013-07-30 DIAGNOSIS — J449 Chronic obstructive pulmonary disease, unspecified: Secondary | ICD-10-CM | POA: Diagnosis present

## 2013-07-30 DIAGNOSIS — I7092 Chronic total occlusion of artery of the extremities: Secondary | ICD-10-CM | POA: Diagnosis not present

## 2013-07-30 DIAGNOSIS — N179 Acute kidney failure, unspecified: Secondary | ICD-10-CM | POA: Diagnosis present

## 2013-07-30 DIAGNOSIS — R079 Chest pain, unspecified: Secondary | ICD-10-CM | POA: Diagnosis not present

## 2013-07-30 LAB — BASIC METABOLIC PANEL
BUN: 58 mg/dL — AB (ref 6–23)
CALCIUM: 8.9 mg/dL (ref 8.4–10.5)
CO2: 17 mEq/L — ABNORMAL LOW (ref 19–32)
CREATININE: 2.12 mg/dL — AB (ref 0.50–1.10)
Chloride: 97 mEq/L (ref 96–112)
GFR calc Af Amer: 25 mL/min — ABNORMAL LOW (ref >90)
GFR calc non Af Amer: 22 mL/min — ABNORMAL LOW (ref >90)
GLUCOSE: 133 mg/dL — AB (ref 70–99)
Potassium: 5.7 mEq/L — ABNORMAL HIGH (ref 3.7–5.3)
SODIUM: 130 meq/L — AB (ref 137–147)

## 2013-07-30 LAB — I-STAT TROPONIN, ED: TROPONIN I, POC: 0.02 ng/mL (ref 0.00–0.08)

## 2013-07-30 LAB — TROPONIN I

## 2013-07-30 LAB — PRO B NATRIURETIC PEPTIDE: Pro B Natriuretic peptide (BNP): 3021 pg/mL — ABNORMAL HIGH (ref 0–125)

## 2013-07-30 LAB — PROTIME-INR
INR: 1.05 (ref 0.00–1.49)
Prothrombin Time: 13.5 seconds (ref 11.6–15.2)

## 2013-07-30 LAB — CBC
HCT: 31.4 % — ABNORMAL LOW (ref 36.0–46.0)
HEMOGLOBIN: 10 g/dL — AB (ref 12.0–15.0)
MCH: 29.6 pg (ref 26.0–34.0)
MCHC: 31.8 g/dL (ref 30.0–36.0)
MCV: 92.9 fL (ref 78.0–100.0)
Platelets: 195 10*3/uL (ref 150–400)
RBC: 3.38 MIL/uL — ABNORMAL LOW (ref 3.87–5.11)
RDW: 15.3 % (ref 11.5–15.5)
WBC: 7.5 10*3/uL (ref 4.0–10.5)

## 2013-07-30 LAB — GLUCOSE, CAPILLARY: GLUCOSE-CAPILLARY: 157 mg/dL — AB (ref 70–99)

## 2013-07-30 MED ORDER — INSULIN GLARGINE 100 UNIT/ML ~~LOC~~ SOLN
13.0000 [IU] | Freq: Every day | SUBCUTANEOUS | Status: DC
  Administered 2013-07-31 – 2013-08-09 (×10): 13 [IU] via SUBCUTANEOUS
  Filled 2013-07-30 (×12): qty 0.13

## 2013-07-30 MED ORDER — GABAPENTIN 100 MG PO CAPS
100.0000 mg | ORAL_CAPSULE | Freq: Two times a day (BID) | ORAL | Status: DC
  Filled 2013-07-30: qty 1

## 2013-07-30 MED ORDER — GABAPENTIN 100 MG PO CAPS
200.0000 mg | ORAL_CAPSULE | Freq: Every day | ORAL | Status: DC
  Administered 2013-07-31 – 2013-08-09 (×10): 200 mg via ORAL
  Filled 2013-07-30 (×13): qty 2

## 2013-07-30 MED ORDER — INSULIN ASPART 100 UNIT/ML ~~LOC~~ SOLN
0.0000 [IU] | Freq: Three times a day (TID) | SUBCUTANEOUS | Status: DC
  Administered 2013-08-01 (×2): 1 [IU] via SUBCUTANEOUS
  Administered 2013-08-02 (×2): 2 [IU] via SUBCUTANEOUS
  Administered 2013-08-03: 1 [IU] via SUBCUTANEOUS
  Administered 2013-08-03: 3 [IU] via SUBCUTANEOUS
  Administered 2013-08-03 – 2013-08-07 (×8): 2 [IU] via SUBCUTANEOUS
  Administered 2013-08-07: 1 [IU] via SUBCUTANEOUS
  Administered 2013-08-08: 3 [IU] via SUBCUTANEOUS
  Administered 2013-08-08: 1 [IU] via SUBCUTANEOUS
  Administered 2013-08-08: 3 [IU] via SUBCUTANEOUS
  Administered 2013-08-09 (×3): 2 [IU] via SUBCUTANEOUS
  Administered 2013-08-10: 3 [IU] via SUBCUTANEOUS
  Administered 2013-08-10: 2 [IU] via SUBCUTANEOUS

## 2013-07-30 MED ORDER — ONDANSETRON HCL 4 MG PO TABS: 4.0000 mg | ORAL_TABLET | Freq: Four times a day (QID) | ORAL | Status: DC | PRN

## 2013-07-30 MED ORDER — PIOGLITAZONE HCL 45 MG PO TABS
45.0000 mg | ORAL_TABLET | Freq: Every day | ORAL | Status: DC
  Administered 2013-07-31: 45 mg via ORAL
  Filled 2013-07-30: qty 1

## 2013-07-30 MED ORDER — CALCIUM-VITAMIN D 250-125 MG-UNIT PO TABS: 1.0000 | ORAL_TABLET | Freq: Two times a day (BID) | ORAL | Status: DC

## 2013-07-30 MED ORDER — HYDROCODONE-ACETAMINOPHEN 5-325 MG PO TABS
1.0000 | ORAL_TABLET | Freq: Once | ORAL | Status: AC
  Administered 2013-07-30: 1 via ORAL
  Filled 2013-07-30: qty 1

## 2013-07-30 MED ORDER — FUROSEMIDE 10 MG/ML IJ SOLN
40.0000 mg | Freq: Three times a day (TID) | INTRAMUSCULAR | Status: DC
  Administered 2013-07-31 (×2): 40 mg via INTRAVENOUS
  Filled 2013-07-30 (×4): qty 4

## 2013-07-30 MED ORDER — ONDANSETRON HCL 4 MG/2ML IJ SOLN
4.0000 mg | Freq: Four times a day (QID) | INTRAMUSCULAR | Status: DC | PRN
  Administered 2013-08-08: 4 mg via INTRAVENOUS
  Filled 2013-07-30 (×2): qty 2

## 2013-07-30 MED ORDER — CARVEDILOL 25 MG PO TABS
25.0000 mg | ORAL_TABLET | Freq: Two times a day (BID) | ORAL | Status: DC
  Administered 2013-07-31 – 2013-08-02 (×5): 25 mg via ORAL
  Filled 2013-07-30 (×9): qty 1

## 2013-07-30 MED ORDER — ATORVASTATIN CALCIUM 80 MG PO TABS
80.0000 mg | ORAL_TABLET | Freq: Every day | ORAL | Status: DC
  Administered 2013-07-31 – 2013-08-09 (×10): 80 mg via ORAL
  Filled 2013-07-30 (×12): qty 1

## 2013-07-30 MED ORDER — FUROSEMIDE 10 MG/ML IJ SOLN
40.0000 mg | Freq: Once | INTRAMUSCULAR | Status: AC
  Administered 2013-07-30: 40 mg via INTRAVENOUS
  Filled 2013-07-30: qty 4

## 2013-07-30 MED ORDER — TIOTROPIUM BROMIDE MONOHYDRATE 18 MCG IN CAPS
18.0000 ug | ORAL_CAPSULE | Freq: Every day | RESPIRATORY_TRACT | Status: DC
  Administered 2013-07-31 – 2013-08-10 (×9): 18 ug via RESPIRATORY_TRACT
  Filled 2013-07-30 (×4): qty 5

## 2013-07-30 MED ORDER — CALCIUM CARBONATE-VITAMIN D 500-200 MG-UNIT PO TABS
1.0000 | ORAL_TABLET | Freq: Every day | ORAL | Status: DC
  Administered 2013-07-31 – 2013-08-02 (×3): 1 via ORAL
  Filled 2013-07-30 (×6): qty 1

## 2013-07-30 MED ORDER — ESCITALOPRAM OXALATE 20 MG PO TABS
20.0000 mg | ORAL_TABLET | Freq: Every day | ORAL | Status: DC
Start: 2013-07-31 — End: 2013-08-10
  Administered 2013-07-31 – 2013-08-10 (×10): 20 mg via ORAL
  Filled 2013-07-30 (×11): qty 1

## 2013-07-30 MED ORDER — SODIUM CHLORIDE 0.9 % IJ SOLN
3.0000 mL | Freq: Two times a day (BID) | INTRAMUSCULAR | Status: DC
  Administered 2013-07-31 – 2013-08-08 (×9): 3 mL via INTRAVENOUS

## 2013-07-30 MED ORDER — ACETAMINOPHEN 325 MG PO TABS
650.0000 mg | ORAL_TABLET | Freq: Four times a day (QID) | ORAL | Status: DC | PRN
  Administered 2013-08-01 – 2013-08-09 (×4): 650 mg via ORAL
  Filled 2013-07-30 (×4): qty 2

## 2013-07-30 MED ORDER — GABAPENTIN 100 MG PO CAPS
100.0000 mg | ORAL_CAPSULE | Freq: Every day | ORAL | Status: DC
  Administered 2013-07-31 – 2013-08-10 (×10): 100 mg via ORAL
  Filled 2013-07-30 (×11): qty 1

## 2013-07-30 MED ORDER — SODIUM CHLORIDE 0.9 % IJ SOLN
3.0000 mL | Freq: Two times a day (BID) | INTRAMUSCULAR | Status: DC
  Administered 2013-08-01 – 2013-08-10 (×12): 3 mL via INTRAVENOUS

## 2013-07-30 MED ORDER — ACETAMINOPHEN 650 MG RE SUPP: 650.0000 mg | Freq: Four times a day (QID) | RECTAL | Status: DC | PRN

## 2013-07-30 MED ORDER — HYDROCODONE-ACETAMINOPHEN 10-325 MG PO TABS
1.0000 | ORAL_TABLET | Freq: Four times a day (QID) | ORAL | Status: DC | PRN
  Administered 2013-07-31 – 2013-08-10 (×15): 1 via ORAL
  Filled 2013-07-30 (×16): qty 1

## 2013-07-30 MED ORDER — AMLODIPINE BESYLATE 10 MG PO TABS
10.0000 mg | ORAL_TABLET | Freq: Every day | ORAL | Status: DC
  Administered 2013-07-31 – 2013-08-10 (×10): 10 mg via ORAL
  Filled 2013-07-30 (×11): qty 1

## 2013-07-30 MED ORDER — ALBUTEROL SULFATE (2.5 MG/3ML) 0.083% IN NEBU
3.0000 mL | INHALATION_SOLUTION | Freq: Four times a day (QID) | RESPIRATORY_TRACT | Status: DC | PRN
  Filled 2013-07-30: qty 3

## 2013-07-30 NOTE — Progress Notes (Signed)
ANTICOAGULATION CONSULT NOTE - Initial Consult  Pharmacy Consult for heparin   Indication: VTE treatment  Allergies  Allergen Reactions  . Codeine Nausea Only  . Morphine And Related Nausea And Vomiting    Patient Measurements: Height: 5' (152.4 cm) Weight: 215 lb 9.8 oz (97.8 kg) (Scale A) IBW/kg (Calculated) : 45.5 Heparin Dosing Weight:   Vital Signs: Temp: 98.2 F (36.8 C) (05/10 2237) Temp src: Oral (05/10 2237) BP: 169/50 mmHg (05/10 2237) Pulse Rate: 58 (05/10 2237)  Labs:  Recent Labs  07/30/13 1931  HGB 10.0*  HCT 31.4*  PLT 195  LABPROT 13.5  INR 1.05  CREATININE 2.12*  TROPONINI <0.30    Estimated Creatinine Clearance: 24.8 ml/min (by C-G formula based on Cr of 2.12).   Medical History: Past Medical History  Diagnosis Date  . Diabetes mellitus without complication   . Hypertension   . Renal disorder   . Gallstone   . Arthritis   . DVT (deep venous thrombosis)     2011  . COPD (chronic obstructive pulmonary disease)   . Pneumonia 2014  . Neuropathy   . GERD (gastroesophageal reflux disease)   . Constipation   . Anemia     Medications:  Prescriptions prior to admission  Medication Sig Dispense Refill  . acetaminophen (TYLENOL) 500 MG tablet Take 500 mg by mouth every 6 (six) hours as needed.      . Aclidinium Bromide (TUDORZA PRESSAIR) 400 MCG/ACT AEPB Inhale 1 puff into the lungs every 12 (twelve) hours.      Marland Kitchen albuterol (PROVENTIL HFA;VENTOLIN HFA) 108 (90 BASE) MCG/ACT inhaler Inhale 2 puffs into the lungs every 6 (six) hours as needed for wheezing or shortness of breath.      Marland Kitchen amLODipine (NORVASC) 10 MG tablet Take 10 mg by mouth daily.      . calcium-vitamin D (OSCAL) 250-125 MG-UNIT per tablet Take 1 tablet by mouth 2 (two) times daily.      . carvedilol (COREG) 25 MG tablet Take 25 mg by mouth 2 (two) times daily with a meal.      . chlorthalidone (HYGROTON) 50 MG tablet Take 50 mg by mouth daily.      Marland Kitchen escitalopram (LEXAPRO) 20  MG tablet Take 20 mg by mouth daily.      Marland Kitchen gabapentin (NEURONTIN) 100 MG capsule Take 100 mg by mouth 2 (two) times daily. 100 mg in the morning and 200 mg at bedtime      . glipiZIDE (GLUCOTROL XL) 5 MG 24 hr tablet Take 10 mg by mouth daily with breakfast.      . HYDROcodone-acetaminophen (NORCO) 10-325 MG per tablet Take 1 tablet by mouth every 6 (six) hours as needed.      . insulin glargine (LANTUS) 100 UNIT/ML injection Inject 13 Units into the skin at bedtime.      . pioglitazone (ACTOS) 45 MG tablet Take 45 mg by mouth daily.      . rosuvastatin (CRESTOR) 40 MG tablet Take 40 mg by mouth daily.        Assessment: Pt with hx VTE due for femoral-femoral bypass graft in am. Off coumadin for procedure. Heparin for bridge until off in am.   Goal of Therapy:  Heparin level 0.3-0.7 units/ml Monitor platelets by anticoagulation protocol: Yes   Plan:  Heparin to begin with 4000 unit bolus then 1250 units/hr until off for surgery in am. F/u post-op   Curlene Dolphin 07/30/2013,11:45 PM

## 2013-07-30 NOTE — ED Notes (Signed)
Lab called about add on blood work.

## 2013-07-30 NOTE — ED Provider Notes (Signed)
CSN: [id number]     Arrival date & time 07/30/13  1910 History   First MD Initiated Contact with Patient 07/30/13 1917     Chief Complaint  Patient presents with  . Shortness of Breath  . Chest Pain     (Consider location/radiation/quality/duration/timing/severity/associated sxs/prior Treatment) Patient is a 73 y.o. female presenting with shortness of breath and chest pain. The history is provided by the patient.  Shortness of Breath Severity:  Moderate Onset quality:  Gradual Timing:  Constant Progression:  Unchanged Chronicity:  New Context: activity   Relieved by:  Rest Worsened by:  Exertion Ineffective treatments:  None tried Associated symptoms: chest pain (pressure like, with exertion, relieved with rest, new)   Associated symptoms: no fever and no vomiting   Risk factors: hx of PE/DVT (DVT prior, off coumadin for upcoming vascular surgery)   Chest Pain Associated symptoms: shortness of breath   Associated symptoms: no fever and not vomiting     Past Medical History  Diagnosis Date  . Diabetes mellitus without complication   . Hypertension   . Renal disorder   . Gallstone   . Arthritis   . DVT (deep venous thrombosis)     2011  . COPD (chronic obstructive pulmonary disease)   . Pneumonia 2014  . Neuropathy   . GERD (gastroesophageal reflux disease)   . Constipation   . Anemia    Past Surgical History  Procedure Laterality Date  . Tubal ligation    . Carpal tunnel release Bilateral   . Eye surgery Bilateral     cataract  . Foot surgery Bilateral     bone spurs, and repair  . Carotid endarterectomy Right 1990  . Breast biopsy Left   . Lung biopsy      non cancerous  . Colonoscopy w/ polypectomy     No family history on file. History  Substance Use Topics  . Smoking status: Former Smoker -- 34 years  . Smokeless tobacco: Not on file  . Alcohol Use: No   OB History   Grav Para Term Preterm Abortions TAB SAB Ect Mult Living                  Review of Systems  Constitutional: Negative for fever and chills.  Respiratory: Positive for shortness of breath.   Cardiovascular: Positive for chest pain (pressure like, with exertion, relieved with rest, new) and leg swelling.  Gastrointestinal: Negative for vomiting.  All other systems reviewed and are negative.     Allergies  Codeine and Morphine and related  Home Medications   Prior to Admission medications   Medication Sig Start Date End Date Taking? Authorizing Provider  acetaminophen (TYLENOL) 500 MG tablet Take 500 mg by mouth every 6 (six) hours as needed.    Historical Provider, MD  Aclidinium Bromide (TUDORZA PRESSAIR) 400 MCG/ACT AEPB Inhale 1 puff into the lungs every 12 (twelve) hours.    Historical Provider, MD  albuterol (PROVENTIL HFA;VENTOLIN HFA) 108 (90 BASE) MCG/ACT inhaler Inhale 2 puffs into the lungs every 6 (six) hours as needed for wheezing or shortness of breath.    Historical Provider, MD  amLODipine (NORVASC) 10 MG tablet Take 10 mg by mouth daily.    Historical Provider, MD  calcium-vitamin D (OSCAL) 250-125 MG-UNIT per tablet Take 1 tablet by mouth 2 (two) times daily.    Historical Provider, MD  carvedilol (COREG) 25 MG tablet Take 25 mg by mouth 2 (two) times daily with a meal.  Historical Provider, MD  chlorthalidone (HYGROTON) 50 MG tablet Take 50 mg by mouth daily.    Historical Provider, MD  escitalopram (LEXAPRO) 20 MG tablet Take 20 mg by mouth daily.    Historical Provider, MD  gabapentin (NEURONTIN) 100 MG capsule Take 100 mg by mouth 2 (two) times daily. 100 mg in the morning and 200 mg at bedtime    Historical Provider, MD  glipiZIDE (GLUCOTROL XL) 5 MG 24 hr tablet Take 10 mg by mouth daily with breakfast.    Historical Provider, MD  HYDROcodone-acetaminophen (NORCO) 10-325 MG per tablet Take 1 tablet by mouth every 6 (six) hours as needed.    Historical Provider, MD  insulin glargine (LANTUS) 100 UNIT/ML injection Inject 13 Units into  the skin at bedtime.    Historical Provider, MD  pioglitazone (ACTOS) 45 MG tablet Take 45 mg by mouth daily.    Historical Provider, MD  rosuvastatin (CRESTOR) 40 MG tablet Take 40 mg by mouth daily.    Historical Provider, MD   BP 134/57  Temp(Src) 97.8 F (36.6 C) (Oral)  Resp 22  SpO2 94% Physical Exam  Nursing note and vitals reviewed. Constitutional: She is oriented to person, place, and time. She appears well-developed and well-nourished. No distress.  HENT:  Head: Normocephalic and atraumatic.  Eyes: EOM are normal. Pupils are equal, round, and reactive to light.  Neck: Normal range of motion. Neck supple.  Cardiovascular: Normal rate and regular rhythm.  Exam reveals no friction rub.   No murmur heard. Pulmonary/Chest: Effort normal. No respiratory distress. She has no wheezes. She has rhonchi (mild, R lung). She has no rales.  Abdominal: Soft. She exhibits no distension. There is no tenderness. There is no rebound.  Musculoskeletal: Normal range of motion. She exhibits edema (2+ to mid shin bilaterally).  Neurological: She is alert and oriented to person, place, and time. No cranial nerve deficit. She exhibits normal muscle tone. Coordination normal.  Skin: No rash noted. She is not diaphoretic.    ED Course  Procedures (including critical care time) Labs Review Labs Reviewed  CBC  BASIC METABOLIC PANEL  PRO B NATRIURETIC PEPTIDE  PROTIME-INR  TROPONIN I  Randolm Idol, ED    Imaging Review Dg Chest Port 1 View  07/30/2013   CLINICAL DATA:  One day history of shortness of breath.  EXAM: PORTABLE CHEST - 1 VIEW  COMPARISON:  Two-view chest x-ray 07/22/2013, 09/26/2012.  FINDINGS: Cardiac silhouette moderately enlarged but stable. Pulmonary venous hypertension and minimal to mild interstitial pulmonary edema. Possible small bilateral pleural effusions. No confluent airspace consolidation.  IMPRESSION: Mild CHF and/or fluid overload, with cardiomegaly and minimal to  mild diffuse interstitial pulmonary edema. Possible small bilateral pleural effusions.   Electronically Signed   By: Evangeline Dakin M.D.   On: 07/30/2013 20:07     EKG Interpretation None      Date: 07/30/2013  Rate: 50  Rhythm: sinus bradycardia  QRS Axis: right  Intervals: normal  ST/T Wave abnormalities: normal  Conduction Disutrbances:right bundle branch block  Narrative Interpretation:   Old EKG Reviewed: unchanged    MDM   Final diagnoses:  CHF (congestive heart failure)  ARF (acute renal failure)  Diabetes mellitus  PAD (peripheral artery disease)    18F with hx of PVD, HTN, COPD presents with exertional SOB and exertional pressure like CP. Rest alleviates both. Also increasing bilateral lower extremity swelling. Patient had recent admission for R foot pain which resulted in iliac artery stents. She  had a left heart cath 3 days ago which showed nonobstructive CAD to prepare for femoral-femoral bypass grafting. She is supposed to be admitted tomorrow for this procedure. She is off her coumadin to prepare for this procedure. On coumadin for remote blood clot, no hx of PE. Here bradycardic in the 50s, no respiratory distress. No CP at rest. Lungs without rales. Bilateral pitting edema up to mid shin.  Labs show elevated BNP, ARF, c/w CHF. I spoke with Vascular surgery, who recommended hospitalist admission. Patient  Given 40 mg of lasix. Admitted.    Osvaldo Shipper, MD 07/30/13 2330

## 2013-07-30 NOTE — H&P (Signed)
[name] History and Physical  Natalie Porter TGG:[id number] DOB: 1940/10/22 DOA: 07/30/2013  Referring physician: ER physician. PCP: No PCP Per Patient   Chief Complaint: Shortness of breath.  HPI: Natalie Porter is a 73 y.o. female with history of diabetes mellitus, peripheral arterial disease, hypertension, probable chronic kidney disease, chronic anemia, DVT who was recently admitted for left lower extremity pain and had preoperative cardiology evaluation including Myoview and cardiac catheter. Patient's cardiac catheter showing nonobstructive CAD and was cleared for surgery and patient was only scheduled for surgery for tomorrow was experiencing shortness of breath since morning with chest tightness. Patient gets easily short of breath on minimal movements. Patient states for the last few days patient has gained 15 pounds. In the ER chest x-ray was showing features concerning for CHF. Patient's creatinine also has increased from baseline. Patient was given Lasix 40 mg IV one dose and admitted for further workup. On exam patient has significant edema of the lower extremities. Patient's left lower extremity is erythematous and patient's family states that it has actually improved from last week. Patient denies any nausea vomiting abdominal pain diarrhea fever chills productive cough.  Review of Systems: As presented in the history of presenting illness, rest negative.  Past Medical History  Diagnosis Date  . Diabetes mellitus without complication   . Hypertension   . Renal disorder   . Gallstone   . Arthritis   . DVT (deep venous thrombosis)     2011  . COPD (chronic obstructive pulmonary disease)   . Pneumonia 2014  . Neuropathy   . GERD (gastroesophageal reflux disease)   . Constipation   . Anemia    Past Surgical History  Procedure Laterality Date  . Tubal ligation    . Carpal tunnel release Bilateral   . Eye surgery Bilateral     cataract  . Foot surgery  Bilateral     bone spurs, and repair  . Carotid endarterectomy Right 1990  . Breast biopsy Left   . Lung biopsy      non cancerous  . Colonoscopy w/ polypectomy     Social History:  reports that she has quit smoking. She does not have any smokeless tobacco history on file. She reports that she does not drink alcohol or use illicit drugs. Where does patient live home. Can patient participate in ADLs? Yes.  Allergies  Allergen Reactions  . Codeine Nausea Only  . Morphine And Related Nausea And Vomiting    Family History: History reviewed. No pertinent family history.    Prior to Admission medications   Medication Sig Start Date End Date Taking? Authorizing Provider  acetaminophen (TYLENOL) 500 MG tablet Take 500 mg by mouth every 6 (six) hours as needed.   Yes Historical Provider, MD  Aclidinium Bromide (TUDORZA PRESSAIR) 400 MCG/ACT AEPB Inhale 1 puff into the lungs every 12 (twelve) hours.   Yes Historical Provider, MD  albuterol (PROVENTIL HFA;VENTOLIN HFA) 108 (90 BASE) MCG/ACT inhaler Inhale 2 puffs into the lungs every 6 (six) hours as needed for wheezing or shortness of breath.   Yes Historical Provider, MD  amLODipine (NORVASC) 10 MG tablet Take 10 mg by mouth daily.   Yes Historical Provider, MD  calcium-vitamin D (OSCAL) 250-125 MG-UNIT per tablet Take 1 tablet by mouth 2 (two) times daily.   Yes Historical Provider, MD  carvedilol (COREG) 25 MG tablet Take 25 mg by mouth 2 (two) times daily with a meal.   Yes Historical Provider, MD  chlorthalidone (HYGROTON)  50 MG tablet Take 50 mg by mouth daily.   Yes Historical Provider, MD  escitalopram (LEXAPRO) 20 MG tablet Take 20 mg by mouth daily.   Yes Historical Provider, MD  gabapentin (NEURONTIN) 100 MG capsule Take 100 mg by mouth 2 (two) times daily. 100 mg in the morning and 200 mg at bedtime   Yes Historical Provider, MD  glipiZIDE (GLUCOTROL XL) 5 MG 24 hr tablet Take 10 mg by mouth daily with breakfast.   Yes Historical  Provider, MD  HYDROcodone-acetaminophen (NORCO) 10-325 MG per tablet Take 1 tablet by mouth every 6 (six) hours as needed.   Yes Historical Provider, MD  insulin glargine (LANTUS) 100 UNIT/ML injection Inject 13 Units into the skin at bedtime.   Yes Historical Provider, MD  pioglitazone (ACTOS) 45 MG tablet Take 45 mg by mouth daily.   Yes Historical Provider, MD  rosuvastatin (CRESTOR) 40 MG tablet Take 40 mg by mouth daily.   Yes Historical Provider, MD    Physical Exam: Filed Vitals:   07/30/13 1916 07/30/13 2015 07/30/13 2148 07/30/13 2237  BP: 134/57 118/88 118/89 169/50  Pulse:  49  58  Temp: 97.8 F (36.6 C)   98.2 F (36.8 C)  TempSrc: Oral   Oral  Resp: 22 19 19 18   Height:   5' (1.524 m)   Weight:   97.8 kg (215 lb 9.8 oz)   SpO2: 94% 96% 94% 93%     General:  Well-developed and nourished.  Eyes: Anicteric no pallor.  ENT: No discharge from the ears eyes nose mouth.  Neck: No mass felt.  Cardiovascular: S1-S2 heard.  Respiratory: No rhonchi or crepitations.  Abdomen: Soft nontender bowel sounds present. No guarding or rigidity.  Skin: Left foot looks erythematous.  Musculoskeletal: Edema both lower extremities.  Psychiatric: Appears normal.  Neurologic: Alert awake oriented to time place and person. Moves all extremities.  Labs on Admission:  Basic Metabolic Panel:  Recent Labs Lab 07/24/13 0535 07/25/13 0427 07/26/13 0745 07/27/13 0505 07/27/13 1355 07/30/13 1931  NA 136* 135* 137 135*  --  130*  K 5.7* 5.5* 5.3 5.3  --  5.7*  CL 104 102 104 104  --  97  CO2 21 21 21 20   --  17*  GLUCOSE 74 104* 75 54*  --  133*  BUN 49* 47* 43* 44*  --  58*  CREATININE 1.74* 1.58* 1.41* 1.42* 1.32* 2.12*  CALCIUM 8.0* 8.2* 8.6 8.3*  --  8.9   Liver Function Tests: No results found for this basename: AST, ALT, ALKPHOS, BILITOT, PROT, ALBUMIN,  in the last 168 hours No results found for this basename: LIPASE, AMYLASE,  in the last 168 hours No results  found for this basename: AMMONIA,  in the last 168 hours CBC:  Recent Labs Lab 07/25/13 0427 07/26/13 0745 07/27/13 0505 07/27/13 1355 07/30/13 1931  WBC 7.8 7.9 7.8 7.9 7.5  HGB 9.4* 9.9* 9.0* 9.6* 10.0*  HCT 29.6* 30.6* 27.4* 30.2* 31.4*  MCV 92.8 91.9 91.6 92.9 92.9  PLT 170 156 150 154 195   Cardiac Enzymes:  Recent Labs Lab 07/30/13 1931  TROPONINI <0.30    BNP (last 3 results)  Recent Labs  07/30/13 1931  PROBNP 3021.0*   CBG:  Recent Labs Lab 07/27/13 0626 07/27/13 0705 07/27/13 1218 07/27/13 1654 07/30/13 2234  GLUCAP 63* 124* 88 163* 157*    Radiological Exams on Admission: Dg Chest Port 1 View  07/30/2013   CLINICAL DATA:  One day history of shortness of breath.  EXAM: PORTABLE CHEST - 1 VIEW  COMPARISON:  Two-view chest x-ray 07/22/2013, 09/26/2012.  FINDINGS: Cardiac silhouette moderately enlarged but stable. Pulmonary venous hypertension and minimal to mild interstitial pulmonary edema. Possible small bilateral pleural effusions. No confluent airspace consolidation.  IMPRESSION: Mild CHF and/or fluid overload, with cardiomegaly and minimal to mild diffuse interstitial pulmonary edema. Possible small bilateral pleural effusions.   Electronically Signed   By: Evangeline Dakin M.D.   On: 07/30/2013 20:07    EKG: Independently reviewed. Normal sinus rhythm with RBBB.  Assessment/Plan Principal Problem:   CHF (congestive heart failure) Active Problems:   PAD (peripheral artery disease)   ARF (acute renal failure)   Anemia   Hypertension   HLD (hyperlipidemia)   Diabetes mellitus   1. CHF last EF measured was 60-65% last week also had nonobstructive CAD per cardiac catheter last week - at this time I have placed patient on Lasix 40 mg IV every 8 hourly. Closely follow intake output and metabolic panel given that patient also has worsening of the kidney function. May have to discontinue Actos due to CHF. 2. Acute renal failure probably on chronic  kidney disease with mild hypokalemia and metabolic acidosis - since patient has obvious CHF with fluid overload patient has been placed on Lasix. Check urinalysis, check FeNa, SPEP UPEP and renal sonogram. Patient has had recent cardiac catheter with IV contrast. If creatinine worsens patient will need nephrology consult. 3. Peripheral arterial disease and history of DVT - patient was getting prepared for possible surgery tomorrow by Dr. Oneida Alar. I have placed patient on heparin infusion for now. ED physician did discuss with on-call was consulted. Consult Dr. Oneida Alar in a.m. 4. Hypertension - continue present medications. 5. Diabetes mellitus - hold glipizide due to renal failure. May have to discontinue Actos due to the CHF. 6. Chronic anemia - closely follow CBC.    Code Status: Full code.  Family Communication: Family the bedside.  Disposition Plan: Admit to inpatient.    [facility] Pager [phone].  If 7PM-7AM, please contact night-coverage [email or web address] Password [email or web address] 07/30/2013, 11:17 PM

## 2013-07-30 NOTE — ED Notes (Signed)
C/o sob, non-productive cough, wheezing, and chest heaviness since this morning.  Pt states she is scheduled for bypass surgery tomorrow and stopped taking Coumadin a few days ago while in hospital.  Also reports swelling all over.

## 2013-07-31 ENCOUNTER — Inpatient Hospital Stay (HOSPITAL_COMMUNITY): Payer: Medicare Other | Admitting: Certified Registered Nurse Anesthetist

## 2013-07-31 ENCOUNTER — Encounter (HOSPITAL_COMMUNITY): Payer: Medicare Other | Admitting: Certified Registered Nurse Anesthetist

## 2013-07-31 ENCOUNTER — Inpatient Hospital Stay (HOSPITAL_COMMUNITY): Admission: RE | Admit: 2013-07-31 | Payer: Medicare Other | Source: Ambulatory Visit | Admitting: Vascular Surgery

## 2013-07-31 ENCOUNTER — Encounter (HOSPITAL_COMMUNITY)
Admission: EM | Disposition: A | Discharge status: Home / Self Care | Payer: Self-pay | Source: Home / Self Care | Attending: Internal Medicine

## 2013-07-31 ENCOUNTER — Inpatient Hospital Stay (HOSPITAL_COMMUNITY): Payer: Medicare Other

## 2013-07-31 ENCOUNTER — Encounter (HOSPITAL_COMMUNITY): Payer: Self-pay | Admitting: Certified Registered Nurse Anesthetist

## 2013-07-31 DIAGNOSIS — R0602 Shortness of breath: Secondary | ICD-10-CM

## 2013-07-31 DIAGNOSIS — I743 Embolism and thrombosis of arteries of the lower extremities: Secondary | ICD-10-CM

## 2013-07-31 HISTORY — DX: Anemia, unspecified: D64.9

## 2013-07-31 HISTORY — DX: Polyneuropathy, unspecified: G62.9

## 2013-07-31 HISTORY — DX: Gastro-esophageal reflux disease without esophagitis: K21.9

## 2013-07-31 HISTORY — DX: Pneumonia, unspecified organism: J18.9

## 2013-07-31 HISTORY — DX: Chronic obstructive pulmonary disease, unspecified: J44.9

## 2013-07-31 LAB — COMPREHENSIVE METABOLIC PANEL
ALT: 15 U/L (ref 0–35)
AST: 24 U/L (ref 0–37)
Albumin: 2.4 g/dL — ABNORMAL LOW (ref 3.5–5.2)
Alkaline Phosphatase: 37 U/L — ABNORMAL LOW (ref 39–117)
BUN: 58 mg/dL — AB (ref 6–23)
CALCIUM: 8.4 mg/dL (ref 8.4–10.5)
CO2: 16 mEq/L — ABNORMAL LOW (ref 19–32)
CREATININE: 2 mg/dL — AB (ref 0.50–1.10)
Chloride: 104 mEq/L (ref 96–112)
GFR calc non Af Amer: 24 mL/min — ABNORMAL LOW (ref >90)
GFR, EST AFRICAN AMERICAN: 27 mL/min — AB (ref >90)
GLUCOSE: 70 mg/dL (ref 70–99)
Potassium: 5.7 mEq/L — ABNORMAL HIGH (ref 3.7–5.3)
Sodium: 135 mEq/L — ABNORMAL LOW (ref 137–147)
TOTAL PROTEIN: 6.3 g/dL (ref 6.0–8.3)
Total Bilirubin: <0.2 mg/dL — ABNORMAL LOW (ref 0.3–1.2)

## 2013-07-31 LAB — URINALYSIS, ROUTINE W REFLEX MICROSCOPIC
Bilirubin Urine: NEGATIVE
Glucose, UA: NEGATIVE mg/dL
Hgb urine dipstick: NEGATIVE
Ketones, ur: NEGATIVE mg/dL
Leukocytes, UA: NEGATIVE
NITRITE: NEGATIVE
PH: 5 (ref 5.0–8.0)
Protein, ur: 30 mg/dL — AB
SPECIFIC GRAVITY, URINE: 1.013 (ref 1.005–1.030)
Urobilinogen, UA: 0.2 mg/dL (ref 0.0–1.0)

## 2013-07-31 LAB — CBC WITH DIFFERENTIAL/PLATELET
BASOS ABS: 0 10*3/uL (ref 0.0–0.1)
Basophils Relative: 1 % (ref 0–1)
Eosinophils Absolute: 0.3 10*3/uL (ref 0.0–0.7)
Eosinophils Relative: 4 % (ref 0–5)
HEMATOCRIT: 27.5 % — AB (ref 36.0–46.0)
HEMOGLOBIN: 8.8 g/dL — AB (ref 12.0–15.0)
LYMPHS PCT: 25 % (ref 12–46)
Lymphs Abs: 1.7 10*3/uL (ref 0.7–4.0)
MCH: 29.6 pg (ref 26.0–34.0)
MCHC: 32 g/dL (ref 30.0–36.0)
MCV: 92.6 fL (ref 78.0–100.0)
MONO ABS: 1.1 10*3/uL — AB (ref 0.1–1.0)
Monocytes Relative: 16 % — ABNORMAL HIGH (ref 3–12)
NEUTROS ABS: 3.5 10*3/uL (ref 1.7–7.7)
Neutrophils Relative %: 54 % (ref 43–77)
Platelets: 176 10*3/uL (ref 150–400)
RBC: 2.97 MIL/uL — AB (ref 3.87–5.11)
RDW: 15.1 % (ref 11.5–15.5)
WBC: 6.6 10*3/uL (ref 4.0–10.5)

## 2013-07-31 LAB — GLUCOSE, CAPILLARY
GLUCOSE-CAPILLARY: 66 mg/dL — AB (ref 70–99)
GLUCOSE-CAPILLARY: 77 mg/dL (ref 70–99)
Glucose-Capillary: 96 mg/dL (ref 70–99)
Glucose-Capillary: 111 mg/dL — ABNORMAL HIGH (ref 70–99)
Glucose-Capillary: 107 mg/dL — ABNORMAL HIGH (ref 70–99)

## 2013-07-31 LAB — URINE MICROSCOPIC-ADD ON

## 2013-07-31 LAB — TROPONIN I
Troponin I: <0.3 ng/mL (ref <0.30)
Troponin I: <0.3 ng/mL (ref <0.30)

## 2013-07-31 LAB — HEPARIN LEVEL (UNFRACTIONATED): HEPARIN UNFRACTIONATED: 0.3 [IU]/mL (ref 0.30–0.70)

## 2013-07-31 LAB — D-DIMER, QUANTITATIVE (NOT AT ARMC): D-Dimer, Quant: 3.3 ug/mL-FEU — ABNORMAL HIGH (ref 0.00–0.48)

## 2013-07-31 LAB — CREATININE, URINE, RANDOM: Creatinine, Urine: 22.48 mg/dL

## 2013-07-31 LAB — SODIUM, URINE, RANDOM: SODIUM UR: 75 meq/L

## 2013-07-31 SURGERY — CREATION, BYPASS, ARTERIAL, FEMORAL TO FEMORAL, USING GRAFT
Anesthesia: General

## 2013-07-31 MED ORDER — HEPARIN (PORCINE) IN NACL 100-0.45 UNIT/ML-% IJ SOLN
1300.0000 [IU]/h | INTRAMUSCULAR | Status: DC
  Administered 2013-07-31: 1300 [IU]/h via INTRAVENOUS
  Administered 2013-07-31: 1250 [IU]/h via INTRAVENOUS
  Administered 2013-08-01 – 2013-08-05 (×5): 1300 [IU]/h via INTRAVENOUS
  Filled 2013-07-31 (×10): qty 250

## 2013-07-31 MED ORDER — ROCURONIUM BROMIDE 50 MG/5ML IV SOLN
INTRAVENOUS | Status: AC
  Filled 2013-07-31: qty 1

## 2013-07-31 MED ORDER — LIDOCAINE HCL (CARDIAC) 20 MG/ML IV SOLN
INTRAVENOUS | Status: AC
  Filled 2013-07-31: qty 5

## 2013-07-31 MED ORDER — HEPARIN BOLUS VIA INFUSION
4000.0000 [IU] | Freq: Once | INTRAVENOUS | Status: AC
  Administered 2013-07-31: 4000 [IU] via INTRAVENOUS
  Filled 2013-07-31: qty 4000

## 2013-07-31 MED ORDER — PROPOFOL 10 MG/ML IV BOLUS
INTRAVENOUS | Status: AC
  Filled 2013-07-31: qty 20

## 2013-07-31 MED ORDER — ONDANSETRON HCL 4 MG/2ML IJ SOLN
INTRAMUSCULAR | Status: AC
  Filled 2013-07-31: qty 2

## 2013-07-31 MED ORDER — MIDAZOLAM HCL 2 MG/2ML IJ SOLN
INTRAMUSCULAR | Status: AC
  Filled 2013-07-31: qty 2

## 2013-07-31 MED ORDER — FUROSEMIDE 10 MG/ML IJ SOLN
40.0000 mg | Freq: Two times a day (BID) | INTRAMUSCULAR | Status: DC
  Administered 2013-07-31 – 2013-08-03 (×7): 40 mg via INTRAVENOUS
  Filled 2013-07-31 (×11): qty 4

## 2013-07-31 MED ORDER — FENTANYL CITRATE 0.05 MG/ML IJ SOLN
INTRAMUSCULAR | Status: AC
  Filled 2013-07-31: qty 5

## 2013-07-31 SURGICAL SUPPLY — 39 items
BAG ISOLATION DRAPE 18X18 (DRAPES) ×2 IMPLANT
BLADE 10 SAFETY STRL DISP (BLADE) ×3 IMPLANT
CANISTER SUCTION 2500CC (MISCELLANEOUS) ×3 IMPLANT
CLIP TI MEDIUM 24 (CLIP) ×3 IMPLANT
CLIP TI WIDE RED SMALL 24 (CLIP) ×3 IMPLANT
COVER SURGICAL LIGHT HANDLE (MISCELLANEOUS) ×3 IMPLANT
DERMABOND ADVANCED (GAUZE/BANDAGES/DRESSINGS)
DERMABOND ADVANCED .7 DNX12 (GAUZE/BANDAGES/DRESSINGS) IMPLANT
DRAIN SNY WOU (WOUND CARE) IMPLANT
DRAPE ISOLATION BAG 18X18 (DRAPES) ×1
DRAPE WARM FLUID 44X44 (DRAPE) ×3 IMPLANT
ELECT REM PT RETURN 9FT ADLT (ELECTROSURGICAL) ×3
ELECTRODE REM PT RTRN 9FT ADLT (ELECTROSURGICAL) ×2 IMPLANT
EVACUATOR SILICONE 100CC (DRAIN) IMPLANT
GLOVE BIO SURGEON STRL SZ7.5 (GLOVE) ×3 IMPLANT
GOWN STRL REUS W/ TWL LRG LVL3 (GOWN DISPOSABLE) ×6 IMPLANT
GOWN STRL REUS W/TWL LRG LVL3 (GOWN DISPOSABLE) ×3
KIT BASIN OR (CUSTOM PROCEDURE TRAY) ×3 IMPLANT
KIT ROOM TURNOVER OR (KITS) ×3 IMPLANT
NS IRRIG 1000ML POUR BTL (IV SOLUTION) ×6 IMPLANT
PACK PERIPHERAL VASCULAR (CUSTOM PROCEDURE TRAY) ×3 IMPLANT
PAD ARMBOARD 7.5X6 YLW CONV (MISCELLANEOUS) ×6 IMPLANT
SPONGE SURGIFOAM ABS GEL 100 (HEMOSTASIS) IMPLANT
STAPLER VISISTAT 35W (STAPLE) IMPLANT
SUT PROLENE 5 0 C 1 24 (SUTURE) ×6 IMPLANT
SUT PROLENE 6 0 CC (SUTURE) IMPLANT
SUT SILK 3 0 (SUTURE)
SUT SILK 3-0 18XBRD TIE 12 (SUTURE) IMPLANT
SUT VIC AB 2-0 CT1 27 (SUTURE) ×2
SUT VIC AB 2-0 CT1 TAPERPNT 27 (SUTURE) ×4 IMPLANT
SUT VIC AB 3-0 SH 27 (SUTURE) ×2
SUT VIC AB 3-0 SH 27X BRD (SUTURE) ×4 IMPLANT
SUT VICRYL 4-0 PS2 18IN ABS (SUTURE) ×6 IMPLANT
TAPE UMBILICAL COTTON 1/8X30 (MISCELLANEOUS) IMPLANT
TOWEL OR 17X24 6PK STRL BLUE (TOWEL DISPOSABLE) ×6 IMPLANT
TOWEL OR 17X26 10 PK STRL BLUE (TOWEL DISPOSABLE) ×3 IMPLANT
TRAY FOLEY CATH 16FRSI W/METER (SET/KITS/TRAYS/PACK) ×3 IMPLANT
UNDERPAD 30X30 INCONTINENT (UNDERPADS AND DIAPERS) ×3 IMPLANT
WATER STERILE IRR 1000ML POUR (IV SOLUTION) ×3 IMPLANT

## 2013-07-31 NOTE — Progress Notes (Signed)
UR completed Bobby Barton K. Tonnia Bardin, RN, BSN, [place], CCM  07/31/2013 6:22 PM

## 2013-07-31 NOTE — Progress Notes (Signed)
Pt's d-dimer=3.30. Tylene Fantasia NP notified. Pt started on heparin drip. Will continue to monitor.

## 2013-07-31 NOTE — Progress Notes (Signed)
Hypoglycemic Event  CBG: 66  Treatment: 1 cup orange juice  Symptoms: none  Follow-up CBG: [id number] CBG Result:96  Possible Reasons for Event: lantus 13units given last night  Comments/MD notified:protocol followed    Freeman Caldron Keneshia Tena  Remember to initiate Hypoglycemia Order Set & complete

## 2013-07-31 NOTE — Progress Notes (Signed)
ANTICOAGULATION CONSULT NOTE - follow up  Pharmacy Consult for heparin   Indication: VTE treatment  Allergies  Allergen Reactions  . Codeine Nausea Only  . Morphine And Related Nausea And Vomiting    Patient Measurements: Height: 5' (152.4 cm) Weight: 213 lb 10 oz (96.9 kg) IBW/kg (Calculated) : 45.5   Vital Signs: Temp: 98.1 F (36.7 C) (05/11 0948) Temp src: Oral (05/11 0948) BP: 134/38 mmHg (05/11 0948) Pulse Rate: 51 (05/11 0948)  Labs:  Recent Labs  07/30/13 1931 07/30/13 2342 07/31/13 0440 07/31/13 1104  HGB 10.0*  --  8.8*  --   HCT 31.4*  --  27.5*  --   PLT 195  --  176  --   LABPROT 13.5  --   --   --   INR 1.05  --   --   --   HEPARINUNFRC  --   --   --  0.30  CREATININE 2.12*  --  2.00*  --   TROPONINI <0.30 <0.30 <0.30  --     Estimated Creatinine Clearance: 26.1 ml/min (by C-G formula based on Cr of 2).    Assessment: Pt with hx VTE. Coumadin reversed with vitamin K on 07/23/13 during previous admission.  Scheduled for R iliac stent vs femoral-femoral bypass graft on 5/11.  Heparin started for bridge therapy.  OR cancelled 5/11 due to CHF exacerbation and worsening renal fxn.   Hg down to 8.8, pltc 176. Heparin level = 0.3 after 4000 unit bolus and 1250 units/hr. HL is at low end of desired goal with no bleeding reported.   Heparin for bridge until off in am.   Goal of Therapy:  Heparin level 0.3-0.7 units/ml Monitor platelets by anticoagulation protocol: Yes   Plan:  1. Increase heparin to 1300 units/hr 2. Daily HL and CBC 3. F/u for OR plans. Heparin will be held on call to OR. 4. I dc'd her actos during CHF exacerbation (but already got dose this am)  Eudelia Bunch, Pharm.D. [phone] 07/31/2013 11:45 AM

## 2013-07-31 NOTE — Care Management Note (Addendum)
    Page 1 of 2   08/10/2013     4:34:19 PM CARE MANAGEMENT NOTE 08/10/2013  Patient:  Natalie Porter, Natalie Porter   Account Number:  [id number]  Date Initiated:  07/31/2013  Documentation initiated by:  Mariann Laster  Subjective/Objective Assessment:   Admitted with swelling, SOB, PVD,  FEM 5/11 cancelled d/t CHF     Action/Plan:   CM to follow for disposition needs   Anticipated DC Date:  08/11/2013   Anticipated DC Plan:  [place]  CM consult      [place]   Choice offered to / List presented to:  C-1 Patient        [place] arranged  HH-1 RN  [place] agency  [place]   Status of service:  Completed, signed off Medicare Important Message given?  YES (If response is "NO", the following Medicare IM given date fields will be blank) Date Medicare IM given:  07/30/2013 Date Additional Medicare IM given:  08/10/2013  Discharge Disposition:  [place]  Per UR Regulation:  Reviewed for med. necessity/level of care/duration of stay  If discussed at [place] of Stay Meetings, dates discussed:   08/10/2013    Comments:  08/10/13 Ellan Lambert, RN, BSN [phone] Pt for dc home today with daughter.  Pt will need HH follow up; wishes to use [facility].  Referral faxed to [facility] at [facility] agency (fax [phone]).  Start of care 24-48h post dc date.  Wound Vacs dc'd today.  Pt denies any dme needs.  Granddaughter and daughter to provide care at dc.  08/08/13- 1445- Marvetta Gibbons RN, BSN [phone] Pt s/p BYPASS GRAFT FEMORAL-FEMORAL ARTERY APPLICATION OF INCISIONAL WOUND VAC (Bilateral) ENDARTERECTOMY FEMORAL (Left) on 08/07/13- fever overnight, keep vacs on until Thursday-will remove them then and hopefully okay to discharge then.  PT/OT evals for recommendations- NCM to continue to follow    [name] Hutchinson RN, BSN, MSHL, CCM  Nurse -  Case Manager, (Unit [phone]  08/03/2013 CM consulted with DTR/Bonnie again today re:  HHS provider. Horris Latino states PCG/GRANDDTR/CNA/[name] is working and she will provide service provider list to her over the weekend to review. PT RECS: remain pending Disposition Plan:  HHS:  RN - CHF mgmt   orders pending   [name] Hutchinson RN, BSN, MSHL, CCM  Nurse - Case Manager, (Unit [place])  [phone]  08/03/2013 Social:  From home with DTR/Bonnie; also has granddtr/[name] who works as Quar[employer] manager who provides care. Home DME:  walker/wheels/seat/bakes; CPAp, [place],  - states need for [facility] Medications:  Self managed. Transportation:  yes/family PCP:  [facility], [place], [place] - Dr. Redmond Pulling additional IM 08/03/2013 - hard copy to chart and patient. Disposition Plan: Home with HHS: RN  Provider pending; patient/dtr has list. New DME:  [facility]SC

## 2013-07-31 NOTE — Consult Note (Signed)
[facility] [place]  Subjective  - Feels better but still short of breath not at baseline   Objective 140/47 54 99.2 F (37.3 C) (Oral) 18 94%  Intake/Output Summary (Last 24 hours) at 07/31/13 0742 Last data filed at 07/31/13 0700  Gross per 24 hour  Intake 315.83 ml  Output   1200 ml  Net -884.17 ml   Left foot with petechial ischemic rubor and new ulcer left calf otherwise stable Right foot pink and warm  Assessment/Planning: Pt with severe PAD prior right leg iliac stent was on schedule for right iliac stent vs fem fem bypass today.  Now with CHF and worsening renal function.    Agree with heparin for now  Will put procedure on hold for now.  Will follow on daily basis to hopefully do her procedure this admission if cardiac/renal function improves.  Elam Dutch 07/31/2013 7:42 AM --  Laboratory Lab Results:  Recent Labs  07/30/13 1931 07/31/13 0440  WBC 7.5 6.6  HGB 10.0* 8.8*  HCT 31.4* 27.5*  PLT 195 176   BMET  Recent Labs  07/30/13 1931 07/31/13 0440  NA 130* 135*  K 5.7* 5.7*  CL 97 104  CO2 17* 16*  GLUCOSE 133* 70  BUN 58* 58*  CREATININE 2.12* 2.00*  CALCIUM 8.9 8.4    COAG Lab Results  Component Value Date   INR 1.05 07/30/2013   INR 1.22 07/27/2013   INR 1.07 07/26/2013   No results found for this basename: PTT

## 2013-07-31 NOTE — Progress Notes (Signed)
Dr. Coralyn Pear made aware of patient's vitals with BP 133/38 and HR 51. Will hold pm dose of Carvedilol per Dr. Coralyn Pear.

## 2013-07-31 NOTE — Progress Notes (Signed)
Heparin gtt increased from 12.64ml/hr to 1ml/hr which is 1300units/hr. Cosigned by Hollice Gong, RN.

## 2013-07-31 NOTE — Progress Notes (Signed)
[name] PROGRESS NOTE  Natalie Porter KCM:[id number] DOB: Aug 07, 1940 DOA: 07/30/2013 PCP: No PCP Per Patient  Assessment/Plan: 1. Acute on chronic diastolic congestive heart failure -Recent transthoracic echocardiogram performed 07/24/2013 showing preserved ejection fraction of 60-65%. -Patient presenting with clinical signs symptoms suggestive of CHF. -Overnight diuresed having output of 1,200 mL, weight, down from 97 kg on admission to 96 kg this morning -Clinically improving on this morning's evaluation.  -Will decrease lasix dose form 40 mg TID to 40 mg BID.   2.  Acute on chronic renal failure -Presented with creatinine of 2.12, BUN 58. Labs on 5/7 showing Creatinine of 1.42 and BUN of 44.  -Will decrease IV lasix to 40mg  IV BID -Follow kidney function  3. Peripheral Arterial Disease -Patient had been scheduled for Fem-Fem bypass today, however this will need to be postponed due to acute CHF -Dr Oneida Alar of vascular surgery evaluated patient  4. Hypertension -Blood pressures stable, remains on Norvasc 10 mg and Coreg 25 mg PO BID  Code Status: Full Code Family Communication:  Disposition Plan: Continue diuresis     HPI/Subjective: Patient is a pleasant 73 year old female with a past medical history of diastolic congestive heart failure, peripheral arterial disease, who was admitted to the medicine service on 07/30/2013 presenting with complaints of cough, worsening bilateral extremity edema and weight gain. She originally scheduled to undergo fem-fem bypass today. Symptoms attributed to acute decompensated congestive heart failure, started on IV Lasix 40 mg 3 times a day. Issue having a recent transthoracic echocardiogram performed on 07/24/2013 showing preserved ejection fraction of 60-65%.  Objective: Filed Vitals:   07/31/13 0948  BP: 134/38  Pulse: 51  Temp: 98.1 F (36.7 C)  Resp: 19    Intake/Output Summary (Last 24 hours) at 07/31/13 1442 Last data  filed at 07/31/13 1441  Gross per 24 hour  Intake 795.83 ml  Output   2150 ml  Net -1354.17 ml   Filed Weights   07/30/13 2148 07/31/13 0501  Weight: 97.8 kg (215 lb 9.8 oz) 96.9 kg (213 lb 10 oz)    Exam:   General:  Reports feeling better, no acute distress  Cardiovascular: regular rate and rhythm, normal S1S2  Respiratory: Bibasilar crackles evident, has 1 + lower extremity edema  Abdomen: Soft, nontender,   Musculoskeletal: positive edema  Data Reviewed: Basic Metabolic Panel:  Recent Labs Lab 07/25/13 0427 07/26/13 0745 07/27/13 0505 07/27/13 1355 07/30/13 1931 07/31/13 0440  NA 135* 137 135*  --  130* 135*  K 5.5* 5.3 5.3  --  5.7* 5.7*  CL 102 104 104  --  97 104  CO2 21 21 20   --  17* 16*  GLUCOSE 104* 75 54*  --  133* 70  BUN 47* 43* 44*  --  58* 58*  CREATININE 1.58* 1.41* 1.42* 1.32* 2.12* 2.00*  CALCIUM 8.2* 8.6 8.3*  --  8.9 8.4   Liver Function Tests:  Recent Labs Lab 07/31/13 0440  AST 24  ALT 15  ALKPHOS 37*  BILITOT <0.2*  PROT 6.3  ALBUMIN 2.4*   No results found for this basename: LIPASE, AMYLASE,  in the last 168 hours No results found for this basename: AMMONIA,  in the last 168 hours CBC:  Recent Labs Lab 07/26/13 0745 07/27/13 0505 07/27/13 1355 07/30/13 1931 07/31/13 0440  WBC 7.9 7.8 7.9 7.5 6.6  NEUTROABS  --   --   --   --  3.5  HGB 9.9* 9.0* 9.6* 10.0* 8.8*  HCT  30.6* 27.4* 30.2* 31.4* 27.5*  MCV 91.9 91.6 92.9 92.9 92.6  PLT 156 150 154 195 176   Cardiac Enzymes:  Recent Labs Lab 07/30/13 1931 07/30/13 2342 07/31/13 0440 07/31/13 1104  TROPONINI <0.30 <0.30 <0.30 <0.30   BNP (last 3 results)  Recent Labs  07/30/13 1931  PROBNP 3021.0*   CBG:  Recent Labs Lab 07/27/13 1654 07/30/13 2234 07/31/13 0617 07/31/13 0654 07/31/13 1052  GLUCAP 163* 157* 66* 96 77    No results found for this or any previous visit (from the past 240 hour(s)).   Studies: Dg Chest Port 1 View  07/30/2013    CLINICAL DATA:  One day history of shortness of breath.  EXAM: PORTABLE CHEST - 1 VIEW  COMPARISON:  Two-view chest x-ray 07/22/2013, 09/26/2012.  FINDINGS: Cardiac silhouette moderately enlarged but stable. Pulmonary venous hypertension and minimal to mild interstitial pulmonary edema. Possible small bilateral pleural effusions. No confluent airspace consolidation.  IMPRESSION: Mild CHF and/or fluid overload, with cardiomegaly and minimal to mild diffuse interstitial pulmonary edema. Possible small bilateral pleural effusions.   Electronically Signed   By: Evangeline Dakin M.D.   On: 07/30/2013 20:07    Scheduled Meds: . amLODipine  10 mg Oral Daily  . atorvastatin  80 mg Oral q1800  . calcium-vitamin D  1 tablet Oral Q breakfast  . carvedilol  25 mg Oral BID WC  . escitalopram  20 mg Oral Daily  . furosemide  40 mg Intravenous Q8H  . gabapentin  100 mg Oral Daily  . gabapentin  200 mg Oral QHS  . insulin aspart  0-9 Units Subcutaneous TID WC  . insulin glargine  13 Units Subcutaneous QHS  . sodium chloride  3 mL Intravenous Q12H  . sodium chloride  3 mL Intravenous Q12H  . tiotropium  18 mcg Inhalation Daily   Continuous Infusions: . heparin 1,300 Units/hr (07/31/13 1150)    Principal Problem:   CHF (congestive heart failure) Active Problems:   PAD (peripheral artery disease)   ARF (acute renal failure)   Anemia   Hypertension   HLD (hyperlipidemia)   Diabetes mellitus    Time spent: 97 min    [place] [facility] Pager [phone]. If 7PM-7AM, please contact night-coverage at [email or web address], password [facility] 07/31/2013, 2:42 PM  LOS: 1 day

## 2013-08-01 DIAGNOSIS — I10 Essential (primary) hypertension: Secondary | ICD-10-CM

## 2013-08-01 DIAGNOSIS — I5031 Acute diastolic (congestive) heart failure: Secondary | ICD-10-CM

## 2013-08-01 LAB — CBC
HCT: 27.4 % — ABNORMAL LOW (ref 36.0–46.0)
Hemoglobin: 8.7 g/dL — ABNORMAL LOW (ref 12.0–15.0)
MCH: 29.7 pg (ref 26.0–34.0)
MCHC: 31.8 g/dL (ref 30.0–36.0)
MCV: 93.5 fL (ref 78.0–100.0)
Platelets: 176 10*3/uL (ref 150–400)
RBC: 2.93 MIL/uL — AB (ref 3.87–5.11)
RDW: 15.8 % — AB (ref 11.5–15.5)
WBC: 7.6 10*3/uL (ref 4.0–10.5)

## 2013-08-01 LAB — GLUCOSE, CAPILLARY
GLUCOSE-CAPILLARY: 138 mg/dL — AB (ref 70–99)
Glucose-Capillary: 79 mg/dL (ref 70–99)
Glucose-Capillary: 125 mg/dL — ABNORMAL HIGH (ref 70–99)
Glucose-Capillary: 182 mg/dL — ABNORMAL HIGH (ref 70–99)

## 2013-08-01 LAB — BASIC METABOLIC PANEL
BUN: 51 mg/dL — ABNORMAL HIGH (ref 6–23)
CO2: 16 mEq/L — ABNORMAL LOW (ref 19–32)
Calcium: 8.9 mg/dL (ref 8.4–10.5)
Chloride: 103 mEq/L (ref 96–112)
Creatinine, Ser: 1.59 mg/dL — ABNORMAL HIGH (ref 0.50–1.10)
GFR calc Af Amer: 36 mL/min — ABNORMAL LOW (ref >90)
GFR calc non Af Amer: 31 mL/min — ABNORMAL LOW (ref >90)
GLUCOSE: 111 mg/dL — AB (ref 70–99)
POTASSIUM: 5.7 meq/L — AB (ref 3.7–5.3)
Sodium: 135 mEq/L — ABNORMAL LOW (ref 137–147)

## 2013-08-01 LAB — PROTEIN ELECTROPHORESIS, SERUM
Albumin ELP: 42.1 % — ABNORMAL LOW (ref 55.8–66.1)
Alpha-1-Globulin: 6.7 % — ABNORMAL HIGH (ref 2.9–4.9)
Alpha-2-Globulin: 18.7 % — ABNORMAL HIGH (ref 7.1–11.8)
Beta 2: 7.5 % — ABNORMAL HIGH (ref 3.2–6.5)
Beta Globulin: 7.7 % — ABNORMAL HIGH (ref 4.7–7.2)
Gamma Globulin: 17.3 % (ref 11.1–18.8)
M-SPIKE, %: NOT DETECTED g/dL
Total Protein ELP: 6.5 g/dL (ref 6.0–8.3)

## 2013-08-01 LAB — HEPARIN LEVEL (UNFRACTIONATED): Heparin Unfractionated: 0.46 IU/mL (ref 0.30–0.70)

## 2013-08-01 MED ORDER — MORPHINE SULFATE 2 MG/ML IJ SOLN: 2.0000 mg | INTRAMUSCULAR | Status: DC | PRN

## 2013-08-01 MED ORDER — SODIUM POLYSTYRENE SULFONATE 15 GM/60ML PO SUSP
15.0000 g | Freq: Once | ORAL | Status: AC
  Administered 2013-08-01: 15 g via ORAL
  Filled 2013-08-01: qty 60

## 2013-08-01 MED ORDER — NEPRO/CARBSTEADY PO LIQD
237.0000 mL | Freq: Three times a day (TID) | ORAL | Status: DC
  Administered 2013-08-01 – 2013-08-02 (×4): 237 mL via ORAL
  Filled 2013-08-01 (×8): qty 237

## 2013-08-01 NOTE — Clinical Documentation Improvement (Signed)
Possible Clinical conditions Morbid Obesity W/ BMI Other condition Cannot clinically determine  Risk Factors: Diagnostics: Ht:5' Wt: 215 lbs 9.8 ozs BMI:  Treatment: monitor, evaluate, treat  Thank You, Joya Salm ,RN Clinical Documentation Specialist:  [place] Information Management

## 2013-08-01 NOTE — Progress Notes (Addendum)
  [facility] Progress Note  08/01/2013 12:01 PM Hospital Day 2  Subjective:  Feeling better today; breathing is better and states her left foot feels better also  Tm 99.9 now afebrile HR 50's 867'E-720'N systolic 47% RA  Filed Vitals:   08/01/13 0956  BP: 152/60  Pulse:   Temp:   Resp:     Physical Exam: Lungs:  Non labored Extremities:  Bilateral feet are warm; + monophasic AT/PT left; + monophasic DP/PT  CBC    Component Value Date/Time   WBC 7.6 08/01/2013 0406   RBC 2.93* 08/01/2013 0406   HGB 8.7* 08/01/2013 0406   HCT 27.4* 08/01/2013 0406   PLT 176 08/01/2013 0406   MCV 93.5 08/01/2013 0406   MCH 29.7 08/01/2013 0406   MCHC 31.8 08/01/2013 0406   RDW 15.8* 08/01/2013 0406   LYMPHSABS 1.7 07/31/2013 0440   MONOABS 1.1* 07/31/2013 0440   EOSABS 0.3 07/31/2013 0440   BASOSABS 0.0 07/31/2013 0440    BMET    Component Value Date/Time   NA 135* 08/01/2013 0931   K 5.7* 08/01/2013 0931   CL 103 08/01/2013 0931   CO2 16* 08/01/2013 0931   GLUCOSE 111* 08/01/2013 0931   BUN 51* 08/01/2013 0931   CREATININE 1.59* 08/01/2013 0931   CALCIUM 8.9 08/01/2013 0931   GFRNONAA 31* 08/01/2013 0931   GFRAA 36* 08/01/2013 0931    INR    Component Value Date/Time   INR 1.05 07/30/2013 1931     Intake/Output Summary (Last 24 hours) at 08/01/13 1201 Last data filed at 08/01/13 1038  Gross per 24 hour  Intake 1236.22 ml  Output   3877 ml  Net -2640.78 ml     Assessment/Plan:  73 y.o. female who has sever PAD with prior right iliac stent with need for fem fem bypass admitted with CHF a couple of days ago Hospital Day 2  -pt breathing better today -creatinine is improved -will not go to surgery tomorrow-continue heparin  Leontine Locket, PA-C [facility] [phone] 08/01/2013 12:01 PM  Left foot still with ischemic rubor but stable Still has peripheral edema Has protein calorie malnutrition will supplement Currently shooting for early  next week to address left leg Morphine IV ordered for breakthrough pain of left foot Renal function improving.  Ruta Hinds, MD [facility] [place] Office: [phone] Pager: [phone]

## 2013-08-01 NOTE — Progress Notes (Signed)
ANTICOAGULATION CONSULT NOTE - follow up  Pharmacy Consult for heparin   Indication: VTE treatment  Allergies  Allergen Reactions  . Codeine Nausea Only  . Morphine And Related Nausea And Vomiting    Patient Measurements: Height: 5' (152.4 cm) Weight: 210 lb 1.6 oz (95.3 kg) IBW/kg (Calculated) : 45.5   Vital Signs: Temp: 97.5 F (36.4 C) (05/12 0429) Temp src: Oral (05/12 0429) BP: 152/60 mmHg (05/12 0956) Pulse Rate: 58 (05/12 0429)  Labs:  Recent Labs  07/30/13 1931 07/30/13 2342 07/31/13 0440 07/31/13 1104 08/01/13 0406 08/01/13 0931  HGB 10.0*  --  8.8*  --  8.7*  --   HCT 31.4*  --  27.5*  --  27.4*  --   PLT 195  --  176  --  176  --   LABPROT 13.5  --   --   --   --   --   INR 1.05  --   --   --   --   --   HEPARINUNFRC  --   --   --  0.30 0.46  --   CREATININE 2.12*  --  2.00*  --   --  1.59*  TROPONINI <0.30 <0.30 <0.30 <0.30  --   --     Estimated Creatinine Clearance: 32.5 ml/min (by C-G formula based on Cr of 1.59).    Assessment: Pt with hx VTE. Coumadin reversed with vitamin K on 07/23/13 during previous admission.  Scheduled for R iliac stent vs femoral-femoral bypass graft on 5/11 but postponed due to CHF exacerbation.  Heparin started for bridge therapy.  OR cancelled 5/11 due to CHF exacerbation and worsening renal fxn.   Hg down to 8.7, pltc 176.  Creat 2>1.59.  Heparin level = 0.46 on 1300 units/hr. HL is therapeutic with no bleeding reported.   Goal of Therapy:  Heparin level 0.3-0.7 units/ml Monitor platelets by anticoagulation protocol: Yes   Plan:  1. continue heparin at 1300 units/hr 2. Daily HL and CBC 3. F/u for OR plans. Heparin will be held on call to OR.  Eudelia Bunch, Pharm.D. [phone] 08/01/2013 10:53 AM

## 2013-08-01 NOTE — Progress Notes (Signed)
[name] PROGRESS NOTE  Natalie Porter OAC:[id number] DOB: May 28, 1940 DOA: 07/30/2013 PCP: No PCP Per Patient  Interim Summary Patient is a pleasant 73 year old female with a past medical history of type 2 diabetes mellitus, peripheral arterial disease, hypertension, admitted to the medicine service on 07/30/2013. She was scheduled to undergo fem-fem bypass on 07/31/2013, procedure to be performed by Dr. Orland Jarred of vascular. she presented to the emergency department on 07/30/2013 with complaints of increasing shortness of breath and worsening bilateral extremity pain edema. Chest x-ray on admission revealed findings suggestive of CHF/fluid overload. Labs also revealed the presence of acute renal failure with creatinine of 2.12. Patient was started on IV Lasix 40 mg twice a day. She showed clinical improvement and by 08/01/2013 her weight had come down from 97.8 kg to 95.3 kg. With regard to peripheral arterial disease and she was started on IV heparin and has been evaluated by Dr. Orland Jarred during this hospitalization. procedure has been postponed due to acute on chronic diastolic heart failure.                                                                                                                                                                                                                                                            Assessment/Plan:  1. Acute on chronic diastolic congestive heart failure -Recent transthoracic echocardiogram performed 07/24/2013 showing preserved ejection fraction of 60-65%. -Patient presenting with clinical signs symptoms suggestive of CHF. -Has negative fluid balance of 2.5L, weight down from 97 kg on admission to 95.3 kg this morning (dry weight 88 kg) -Clinically improving on this morning's evaluation.  -Continues to have exertional dyspnea. Her dry weight appears to be near 88 kg. Will continue IV Lasix at 40 mg twice a day, recheck a BNP and assess  fluid status in AM  2.  Acute on chronic renal failure -Presented with creatinine of 2.12, BUN 58. Labs on 5/7 showing Creatinine of 1.42 and BUN of 44.  -A.M. lab work today showing improvement in her creatinine at 1.59 -Follow kidney function  3. Peripheral Arterial Disease -Patient had been scheduled for Fem-Fem bypass today, however this will need to be postponed due to acute CHF -Dr Oneida Alar of vascular surgery evaluated patient -Continue IV heparin  4. Hypertension -Blood pressures stable, remains on  Norvasc 10 mg and Coreg 25 mg PO BID  5. Hyperkalemia -Labs show little change in potassium remaining at 5.7 -Will administer kayexalate  Code Status: Full Code Family Communication: Spoke with daughters present at bedside Disposition Plan: Continue IV diuresis     HPI/Subjective: States feeling a little better, improved swelling to extremities, tolerating PO intake. Complains of ongoing exertional dyspnea.   Objective: Filed Vitals:   08/01/13 1406  BP: 135/50  Pulse: 57  Temp: 97.6 F (36.4 C)  Resp: 20    Intake/Output Summary (Last 24 hours) at 08/01/13 1502 Last data filed at 08/01/13 1406  Gross per 24 hour  Intake 1236.22 ml  Output   3527 ml  Net -2290.78 ml   Filed Weights   07/30/13 2148 07/31/13 0501 08/01/13 0429  Weight: 97.8 kg (215 lb 9.8 oz) 96.9 kg (213 lb 10 oz) 95.3 kg (210 lb 1.6 oz)    Exam:   General:  Reports feeling better, no acute distress  Cardiovascular: regular rate and rhythm, normal S1S2  Respiratory: Improved bibasilar crackles, has 1 + lower extremity edema  Abdomen: Soft, nontender,   Musculoskeletal: positive edema  Data Reviewed: Basic Metabolic Panel:  Recent Labs Lab 07/26/13 0745 07/27/13 0505 07/27/13 1355 07/30/13 1931 07/31/13 0440 08/01/13 0931  NA 137 135*  --  130* 135* 135*  K 5.3 5.3  --  5.7* 5.7* 5.7*  CL 104 104  --  97 104 103  CO2 21 20  --  17* 16* 16*  GLUCOSE 75 54*  --  133* 70 111*   BUN 43* 44*  --  58* 58* 51*  CREATININE 1.41* 1.42* 1.32* 2.12* 2.00* 1.59*  CALCIUM 8.6 8.3*  --  8.9 8.4 8.9   Liver Function Tests:  Recent Labs Lab 07/31/13 0440  AST 24  ALT 15  ALKPHOS 37*  BILITOT <0.2*  PROT 6.3  ALBUMIN 2.4*   No results found for this basename: LIPASE, AMYLASE,  in the last 168 hours No results found for this basename: AMMONIA,  in the last 168 hours CBC:  Recent Labs Lab 07/27/13 0505 07/27/13 1355 07/30/13 1931 07/31/13 0440 08/01/13 0406  WBC 7.8 7.9 7.5 6.6 7.6  NEUTROABS  --   --   --  3.5  --   HGB 9.0* 9.6* 10.0* 8.8* 8.7*  HCT 27.4* 30.2* 31.4* 27.5* 27.4*  MCV 91.6 92.9 92.9 92.6 93.5  PLT 150 154 195 176 176   Cardiac Enzymes:  Recent Labs Lab 07/30/13 1931 07/30/13 2342 07/31/13 0440 07/31/13 1104  TROPONINI <0.30 <0.30 <0.30 <0.30   BNP (last 3 results)  Recent Labs  07/30/13 1931  PROBNP 3021.0*   CBG:  Recent Labs Lab 07/31/13 1052 07/31/13 1620 07/31/13 2024 08/01/13 0547 08/01/13 1108  GLUCAP 77 111* 107* 79 125*    No results found for this or any previous visit (from the past 240 hour(s)).   Studies: US Renal  07/31/2013   CLINICAL DATA:  Acute renal failure  EXAM: RENAL/URINARY TRACT ULTRASOUND COMPLETE  COMPARISON:  None.  FINDINGS: Right Kidney:  Length: 13.1 cm in length. Echogenicity within normal limits. No mass or hydronephrosis visualized.  Left Kidney:  Length: 12.2 cm in. Echogenicity within normal limits. No mass or hydronephrosis visualized.  Additional findings: Cholelithiasis.  Bladder:  Appears normal for degree of bladder distention.  IMPRESSION: Within normal limits.  No hydronephrosis.   Electronically Signed   By: Maryclare Bean M.D.   On: 07/31/2013 16:41  Dg Chest Port 1 View  07/30/2013   CLINICAL DATA:  One day history of shortness of breath.  EXAM: PORTABLE CHEST - 1 VIEW  COMPARISON:  Two-view chest x-ray 07/22/2013, 09/26/2012.  FINDINGS: Cardiac silhouette moderately enlarged  but stable. Pulmonary venous hypertension and minimal to mild interstitial pulmonary edema. Possible small bilateral pleural effusions. No confluent airspace consolidation.  IMPRESSION: Mild CHF and/or fluid overload, with cardiomegaly and minimal to mild diffuse interstitial pulmonary edema. Possible small bilateral pleural effusions.   Electronically Signed   By: Evangeline Dakin M.D.   On: 07/30/2013 20:07    Scheduled Meds: . amLODipine  10 mg Oral Daily  . atorvastatin  80 mg Oral q1800  . calcium-vitamin D  1 tablet Oral Q breakfast  . carvedilol  25 mg Oral BID WC  . escitalopram  20 mg Oral Daily  . furosemide  40 mg Intravenous BID  . gabapentin  100 mg Oral Daily  . gabapentin  200 mg Oral QHS  . insulin aspart  0-9 Units Subcutaneous TID WC  . insulin glargine  13 Units Subcutaneous QHS  . sodium chloride  3 mL Intravenous Q12H  . sodium chloride  3 mL Intravenous Q12H  . tiotropium  18 mcg Inhalation Daily   Continuous Infusions: . heparin 1,300 Units/hr (08/01/13 1413)    Principal Problem:   CHF (congestive heart failure) Active Problems:   PAD (peripheral artery disease)   ARF (acute renal failure)   Anemia   Hypertension   HLD (hyperlipidemia)   Diabetes mellitus    Time spent: 39 min    [place] [facility] Pager [phone]. If 7PM-7AM, please contact night-coverage at [email or web address], password [facility] 08/01/2013, 3:02 PM  LOS: 2 days

## 2013-08-01 NOTE — Clinical Documentation Improvement (Signed)
Possible Clinical Conditions?   hyponatremia                               Other Condition                Cannot Clinically Determine   Supporting Information: Risk Factors: diuretic therapy Diagnostics: Na+: 130; 135; 135  (5/10, 5/11; 5/12) Normal range: 137-147 Treatment: monitor, evaluate, treat  Thank You, Joya Salm ,RN Clinical Documentation Specialist:  [place] Information Management

## 2013-08-01 NOTE — Progress Notes (Addendum)
Heart Failure Navigator Consult Note  Presentation: Natalie Porter is a 73 y.o. female with history of diabetes mellitus, peripheral arterial disease, hypertension, probable chronic kidney disease, chronic anemia, DVT who was recently admitted for left lower extremity pain and had preoperative cardiology evaluation including Myoview and cardiac catheter. Patient's cardiac catheter showing nonobstructive CAD and was cleared for surgery and patient was only scheduled for surgery for tomorrow was experiencing shortness of breath since morning with chest tightness. Patient gets easily short of breath on minimal movements. Patient states for the last few days patient has gained 15 pounds. In the ER chest x-ray was showing features concerning for CHF. Patient's creatinine also has increased from baseline. Patient was given Lasix 40 mg IV one dose and admitted for further workup. On exam patient has significant edema of the lower extremities. Patient's left lower extremity is erythematous and patient's family states that it has actually improved from last week. Patient denies any nausea vomiting abdominal pain diarrhea fever chills productive cough   Past Medical History  Diagnosis Date  . Diabetes mellitus without complication   . Hypertension   . Renal disorder   . Gallstone   . Arthritis   . DVT (deep venous thrombosis)     2011  . COPD (chronic obstructive pulmonary disease)   . Pneumonia 2014  . Neuropathy   . GERD (gastroesophageal reflux disease)   . Constipation   . Anemia     History   Social History  . Marital Status: Widowed    Spouse Name: N/A    Number of Children: N/A  . Years of Education: N/A   Social History Main Topics  . Smoking status: Former Smoker -- 34 years  . Smokeless tobacco: None  . Alcohol Use: No  . Drug Use: No  . Sexual Activity: None     Comment: quit 1990   Other Topics Concern  . None   Social History Narrative  . None    ECHO:Study  Conclusions--07/24/13  - Left ventricle: E/e'>10 suggestive of elevated filling pressures The cavity size was normal. Systolic function was normal. The estimated ejection fraction was in the range of 60% to 65%. Wall motion was normal; there were no regional wall motion abnormalities. - Aortic valve: Moderate diffuse thickening and calcification, consistent with sclerosis. Mild regurgitation. - Left atrium: The atrium was mildly dilated.   BNP    Component Value Date/Time   PROBNP 3021.0* 07/30/2013 1931    Education Assessment and Provision:  Detailed education and instructions provided on heart failure disease management including the following:  Signs and symptoms of Heart Failure When to call the physician Importance of daily weights Low sodium diet  Fluid restriction Medication management Anticipated future follow-up appointments  Patient education given on each of the above topics.  Patient and daughter acknowledge understanding and acceptance of all instructions.  I reviewed all the above topics with the patient and her daughters.  They are a bit overwhelmed with all of the new HF information and I will plan to see them again prior to discharge.  The patient is hoping to have her fem/fem procedure this admission.  She does not have a scale however daughter says that they will buy one and assist her to weigh daily.  I reinforced the need for a low sodium diet.  Education Materials:  "Living Better With Heart Failure" Booklet, Daily Weight Tracker Tool and Heart Failure Educational Video.   High Risk Criteria for Readmission and/or Poor Patient Outcomes:  EF <30%- No-60-65%  2 or more admissions in 6 months- Yes  Difficult social situation- No  Demonstrates medication noncompliance- No    Barriers of Care:  Knowledge of HF and recommendations, ability to be complaint  Discharge Planning:   Patient plans to discharge to home with granddaughter.  She would benefit  from HHPT as well as [place] for ongoing HF education and compliance reinforcement.

## 2013-08-02 DIAGNOSIS — I1310 Hypertensive heart and chronic kidney disease without heart failure, with stage 1 through stage 4 chronic kidney disease, or unspecified chronic kidney disease: Secondary | ICD-10-CM | POA: Diagnosis present

## 2013-08-02 LAB — UIFE/LIGHT CHAINS/TP QN, 24-HR UR
Albumin, U: DETECTED
Alpha 1, Urine: DETECTED — AB
Alpha 2, Urine: DETECTED — AB
Beta, Urine: DETECTED — AB
FREE LAMBDA LT CHAINS, UR: 0.37 mg/dL (ref 0.02–0.67)
Free Kappa Lt Chains,Ur: 2.02 mg/dL (ref 0.14–2.42)
Free Kappa/Lambda Ratio: 5.46 ratio (ref 2.04–10.37)
GAMMA UR: DETECTED — AB
Total Protein, Urine: 25.7 mg/dL

## 2013-08-02 LAB — CBC
HCT: 29 % — ABNORMAL LOW (ref 36.0–46.0)
HEMOGLOBIN: 9.2 g/dL — AB (ref 12.0–15.0)
MCH: 29.7 pg (ref 26.0–34.0)
MCHC: 31.7 g/dL (ref 30.0–36.0)
MCV: 93.5 fL (ref 78.0–100.0)
Platelets: 198 10*3/uL (ref 150–400)
RBC: 3.1 MIL/uL — AB (ref 3.87–5.11)
RDW: 16 % — ABNORMAL HIGH (ref 11.5–15.5)
WBC: 7.6 10*3/uL (ref 4.0–10.5)

## 2013-08-02 LAB — GLUCOSE, CAPILLARY
GLUCOSE-CAPILLARY: 198 mg/dL — AB (ref 70–99)
Glucose-Capillary: 112 mg/dL — ABNORMAL HIGH (ref 70–99)
Glucose-Capillary: 160 mg/dL — ABNORMAL HIGH (ref 70–99)
Glucose-Capillary: 210 mg/dL — ABNORMAL HIGH (ref 70–99)

## 2013-08-02 LAB — BASIC METABOLIC PANEL
BUN: 49 mg/dL — ABNORMAL HIGH (ref 6–23)
CO2: 24 meq/L (ref 19–32)
Calcium: 8.9 mg/dL (ref 8.4–10.5)
Chloride: 104 mEq/L (ref 96–112)
Creatinine, Ser: 1.6 mg/dL — ABNORMAL HIGH (ref 0.50–1.10)
GFR calc Af Amer: 36 mL/min — ABNORMAL LOW (ref >90)
GFR calc non Af Amer: 31 mL/min — ABNORMAL LOW (ref >90)
GLUCOSE: 107 mg/dL — AB (ref 70–99)
Potassium: 5.1 mEq/L (ref 3.7–5.3)
SODIUM: 140 meq/L (ref 137–147)

## 2013-08-02 LAB — HEPARIN LEVEL (UNFRACTIONATED): Heparin Unfractionated: 0.45 IU/mL (ref 0.30–0.70)

## 2013-08-02 NOTE — Progress Notes (Signed)
Patient stated that she will wear her CPAP machine tonight.  Stated that she will place it on herself.  Was told if she needed any assistance to call RT.

## 2013-08-02 NOTE — Progress Notes (Signed)
  [facility] Progress Note  08/02/2013 8:20 AM Hospital Day 3  Subjective:  Breathing better; slept better    Filed Vitals:   08/02/13 0430  BP: 146/42  Pulse: 52  Temp: 98.6 F (37 C)  Resp: 18    Physical Exam: Cardiac:  regular Lungs:  Non labored on RA Extremities:  Right foot with 2+ DP; left foot with + doppler signal PT>DP  CBC    Component Value Date/Time   WBC 7.6 08/01/2013 0406   RBC 2.93* 08/01/2013 0406   HGB 8.7* 08/01/2013 0406   HCT 27.4* 08/01/2013 0406   PLT 176 08/01/2013 0406   MCV 93.5 08/01/2013 0406   MCH 29.7 08/01/2013 0406   MCHC 31.8 08/01/2013 0406   RDW 15.8* 08/01/2013 0406   LYMPHSABS 1.7 07/31/2013 0440   MONOABS 1.1* 07/31/2013 0440   EOSABS 0.3 07/31/2013 0440   BASOSABS 0.0 07/31/2013 0440    BMET    Component Value Date/Time   NA 135* 08/01/2013 0931   K 5.7* 08/01/2013 0931   CL 103 08/01/2013 0931   CO2 16* 08/01/2013 0931   GLUCOSE 111* 08/01/2013 0931   BUN 51* 08/01/2013 0931   CREATININE 1.59* 08/01/2013 0931   CALCIUM 8.9 08/01/2013 0931   GFRNONAA 31* 08/01/2013 0931   GFRAA 36* 08/01/2013 0931    INR    Component Value Date/Time   INR 1.05 07/30/2013 1931     Intake/Output Summary (Last 24 hours) at 08/02/13 0820 Last data filed at 08/02/13 0623  Gross per 24 hour  Intake 1266.96 ml  Output   2950 ml  Net -1683.04 ml     Assessment/Plan:  73 y.o. female who has severe PAD with prior right iliac stent with need for fem fem bypass admitted with CHF a couple of days ago [facility] Day 3  -pt continues to have improved breathing today  -still c/o pain in left foot and sore on calf -labs pending for today -continue protein shakes for malnutrition -plan for surgery early next week. -continue heparin   Leontine Locket, PA-C [facility] [phone] 08/02/2013 8:20 AM

## 2013-08-02 NOTE — Progress Notes (Signed)
[name] PROGRESS NOTE Interim History: 73 year old female with a past medical history of type 2 diabetes mellitus, peripheral arterial disease, hypertension, admitted to the medicine service on 07/30/2013. She was scheduled to undergo fem-fem bypass on 07/31/2013, procedure to be performed by Dr. Orland Jarred of vascular. she presented to the emergency department on 07/30/2013 with complaints of increasing shortness of breath and worsening bilateral extremity pain edema. Chest x-ray on admission revealed findings suggestive of CHF/fluid overload. Labs also revealed the presence of acute renal failure with creatinine of 2.12. Patient was started on IV Lasix 40 mg twice a day. She showed clinical improvement and by 08/01/2013 her weight had come down from 97.8 kg to 95.3 kg. With regard to peripheral arterial disease and she was started on IV heparin and has been evaluated by Dr. Orland Jarred during this hospitalization. procedure has been postponed due to acute on chronic diastolic heart failure.   Filed Weights   07/31/13 0501 08/01/13 0429 08/02/13 0430  Weight: 96.9 kg (213 lb 10 oz) 95.3 kg (210 lb 1.6 oz) 94.62 kg (208 lb 9.6 oz)        Intake/Output Summary (Last 24 hours) at 08/02/13 1039 Last data filed at 08/02/13 1025  Gross per 24 hour  Intake 1266.96 ml  Output   2250 ml  Net -983.04 ml     Assessment/Plan: Acute on chronic diastolic congestive heart failure - Recent transthoracic echocardiogram performed 07/24/2013 showing preserved ejection fraction of 60-65%.  - Has negative fluid balance of 2.5L, weight down from 97->94.6 kg (dry weight 88 kg)  - Will continue IV Lasix at 40 mg twice a day, recheck a BMP.  Acute on chronic renal failure/Cardiorenal syndrome:  -Presented with creatinine of 2.12, cr. Trending down. - improved with Diuresis. -Follow kidney function   Peripheral Arterial Disease  -Patient had been scheduled for Fem-Fem bypass today, however this will need  to be postponed due to acute CHF  -Dr Oneida Alar of vascular surgery evaluated patient  -Continue IV heparin, plan for suregry early next week.  Hypertension  -Blood pressures stable, remains on Norvasc 10 mg and Coreg 25 mg PO BID   Hyperkalemia  -Labs show little change in potassium remaining at 5.7  -Will administer kayexalate    Code Status: Full Code  Family Communication: Spoke with daughters present at bedside  Disposition Plan: Continue IV diuresis   Consultants:  vascular  Procedures: ECHO: as above  Antibiotics:  None  HPI/Subjective: No complains had a better night sleep.  Objective: Filed Vitals:   08/01/13 2154 08/01/13 2259 08/02/13 0430 08/02/13 1005  BP:  156/41 146/42   Pulse: 62 60 52   Temp:  98.3 F (36.8 C) 98.6 F (37 C)   TempSrc:  Oral Oral   Resp: 18 20 18    Height:      Weight:   94.62 kg (208 lb 9.6 oz)   SpO2: 96% 96% 96% 97%     Exam:  General: Alert, awake, oriented x3, in no acute distress.  HEENT: No bruits, no goiter. +JVD Heart: Regular rate and rhythm, without murmurs, rubs, gallops.  Lungs: Good air movement, clear Abdomen: Soft, nontender, nondistended, positive bowel sounds.    Data Reviewed: Basic Metabolic Panel:  Recent Labs Lab 07/27/13 0505 07/27/13 1355 07/30/13 1931 07/31/13 0440 08/01/13 0931 08/02/13 0800  NA 135*  --  130* 135* 135* 140  K 5.3  --  5.7* 5.7* 5.7* 5.1  CL 104  --  97 104 103  104  CO2 20  --  17* 16* 16* 24  GLUCOSE 54*  --  133* 70 111* 107*  BUN 44*  --  58* 58* 51* 49*  CREATININE 1.42* 1.32* 2.12* 2.00* 1.59* 1.60*  CALCIUM 8.3*  --  8.9 8.4 8.9 8.9   Liver Function Tests:  Recent Labs Lab 07/31/13 0440  AST 24  ALT 15  ALKPHOS 37*  BILITOT <0.2*  PROT 6.3  ALBUMIN 2.4*   No results found for this basename: LIPASE, AMYLASE,  in the last 168 hours No results found for this basename: AMMONIA,  in the last 168 hours CBC:  Recent Labs Lab 07/27/13 1355 07/30/13 1931  07/31/13 0440 08/01/13 0406 08/02/13 0800  WBC 7.9 7.5 6.6 7.6 7.6  NEUTROABS  --   --  3.5  --   --   HGB 9.6* 10.0* 8.8* 8.7* 9.2*  HCT 30.2* 31.4* 27.5* 27.4* 29.0*  MCV 92.9 92.9 92.6 93.5 93.5  PLT 154 195 176 176 198   Cardiac Enzymes:  Recent Labs Lab 07/30/13 1931 07/30/13 2342 07/31/13 0440 07/31/13 1104  TROPONINI <0.30 <0.30 <0.30 <0.30   BNP (last 3 results)  Recent Labs  07/30/13 1931  PROBNP 3021.0*   CBG:  Recent Labs Lab 08/01/13 0547 08/01/13 1108 08/01/13 1624 08/01/13 2030 08/02/13 0547  GLUCAP 79 125* 138* 182* 112*    No results found for this or any previous visit (from the past 240 hour(s)).   Studies: US Renal  07/31/2013   CLINICAL DATA:  Acute renal failure  EXAM: RENAL/URINARY TRACT ULTRASOUND COMPLETE  COMPARISON:  None.  FINDINGS: Right Kidney:  Length: 13.1 cm in length. Echogenicity within normal limits. No mass or hydronephrosis visualized.  Left Kidney:  Length: 12.2 cm in. Echogenicity within normal limits. No mass or hydronephrosis visualized.  Additional findings: Cholelithiasis.  Bladder:  Appears normal for degree of bladder distention.  IMPRESSION: Within normal limits.  No hydronephrosis.   Electronically Signed   By: Maryclare Bean M.D.   On: 07/31/2013 16:41    Scheduled Meds: . amLODipine  10 mg Oral Daily  . atorvastatin  80 mg Oral q1800  . calcium-vitamin D  1 tablet Oral Q breakfast  . carvedilol  25 mg Oral BID WC  . escitalopram  20 mg Oral Daily  . feeding supplement (NEPRO CARB STEADY)  237 mL Oral TID WC  . furosemide  40 mg Intravenous BID  . gabapentin  100 mg Oral Daily  . gabapentin  200 mg Oral QHS  . insulin aspart  0-9 Units Subcutaneous TID WC  . insulin glargine  13 Units Subcutaneous QHS  . sodium chloride  3 mL Intravenous Q12H  . sodium chloride  3 mL Intravenous Q12H  . tiotropium  18 mcg Inhalation Daily   Continuous Infusions: . heparin 1,300 Units/hr (08/01/13 1413)     [facility] Pager [phone] If 8PM-8AM, please contact night-coverage at [email or web address], password [facility] 08/02/2013, 10:39 AM  LOS: 3 days

## 2013-08-02 NOTE — Progress Notes (Signed)
ANTICOAGULATION CONSULT NOTE - follow up  Pharmacy Consult for Heparin Indication: Hx DVT  Allergies  Allergen Reactions  . Codeine Nausea Only  . Morphine And Related Nausea And Vomiting    Patient Measurements: Height: 5' (152.4 cm) Weight: 208 lb 9.6 oz (94.62 kg) (scale A) IBW/kg (Calculated) : 45.5   Vital Signs: Temp: 98.6 F (37 C) (05/13 0430) Temp src: Oral (05/13 0430) BP: 146/42 mmHg (05/13 0430) Pulse Rate: 52 (05/13 0430)  Labs:  Recent Labs  07/30/13 1931 07/30/13 2342 07/31/13 0440 07/31/13 1104 08/01/13 0406 08/01/13 0931 08/02/13 0800  HGB 10.0*  --  8.8*  --  8.7*  --  9.2*  HCT 31.4*  --  27.5*  --  27.4*  --  29.0*  PLT 195  --  176  --  176  --  198  LABPROT 13.5  --   --   --   --   --   --   INR 1.05  --   --   --   --   --   --   HEPARINUNFRC  --   --   --  0.30 0.46  --  0.45  CREATININE 2.12*  --  2.00*  --   --  1.59* 1.60*  TROPONINI <0.30 <0.30 <0.30 <0.30  --   --   --     Estimated Creatinine Clearance: 32.2 ml/min (by C-G formula based on Cr of 1.6).    Assessment: 31 YOF on warfarin PTA for hx DVT reversed with Vit K on 07/23/13 during a previous admission. The patient was scheduled for a R iliac stent vs femoral-femoral bypass graft on 5/11 and Heparin was started for bridging. OR plans have been postponed due to HF exacerbation - plans now are to pursue the surgery next week.   Heparin level this morning remains therapeutic (HL 0.45 << 0.46, goal of 0.3-0.7). Hgb/Hct okay - trending up, plts wnl. No overt s/sx of bleeding noted.    Goal of Therapy:  Heparin level 0.3-0.7 units/ml Monitor platelets by anticoagulation protocol: Yes   Plan:  1. Continue Heparin at the current rate of 1300 units/hr (13 ml/hr) 2. Will f/u surgery plans 3. Will continue to monitor for any signs/symptoms of bleeding and will follow up with heparin level in the a.m.   Alycia Rossetti, PharmD, BCPS Clinical Pharmacist Pager: [phone] 08/02/2013  10:07 AM

## 2013-08-03 DIAGNOSIS — I1310 Hypertensive heart and chronic kidney disease without heart failure, with stage 1 through stage 4 chronic kidney disease, or unspecified chronic kidney disease: Secondary | ICD-10-CM

## 2013-08-03 LAB — RETICULOCYTES
RBC.: 3.19 MIL/uL — AB (ref 3.87–5.11)
RETIC COUNT ABSOLUTE: 127.6 10*3/uL (ref 19.0–186.0)
RETIC CT PCT: 4 % — AB (ref 0.4–3.1)

## 2013-08-03 LAB — PULMONARY FUNCTION TEST
DL/VA % pred: 93 %
DL/VA: 3.98 ml/min/mmHg/L
DLCO UNC: 13.47 ml/min/mmHg
DLCO unc % pred: 71 %
FEF 25-75 Pre: 0.7 L/sec
FEF2575-%PRED-PRE: 45 %
FEV1-%PRED-PRE: 82 %
FEV1-Pre: 1.48 L
FEV1FVC-%Pred-Pre: 91 %
FEV6-%Pred-Pre: 89 %
FEV6-PRE: 2.04 L
FEV6FVC-%Pred-Pre: 99 %
FVC-%PRED-PRE: 90 %
FVC-PRE: 2.16 L
Pre FEV1/FVC ratio: 68 %
Pre FEV6/FVC Ratio: 95 %
RV % PRED: 127 %
RV: 2.62 L
TLC % PRED: 109 %
TLC: 4.87 L

## 2013-08-03 LAB — CBC
HCT: 32.5 % — ABNORMAL LOW (ref 36.0–46.0)
Hemoglobin: 10.2 g/dL — ABNORMAL LOW (ref 12.0–15.0)
MCH: 29.4 pg (ref 26.0–34.0)
MCHC: 31.4 g/dL (ref 30.0–36.0)
MCV: 93.7 fL (ref 78.0–100.0)
PLATELETS: 233 10*3/uL (ref 150–400)
RBC: 3.47 MIL/uL — ABNORMAL LOW (ref 3.87–5.11)
RDW: 15.7 % — AB (ref 11.5–15.5)
WBC: 8.5 10*3/uL (ref 4.0–10.5)

## 2013-08-03 LAB — IRON AND TIBC
Iron: 45 ug/dL (ref 42–135)
SATURATION RATIOS: 16 % — AB (ref 20–55)
TIBC: 280 ug/dL (ref 250–470)
UIBC: 235 ug/dL (ref 125–400)

## 2013-08-03 LAB — BASIC METABOLIC PANEL
BUN: 43 mg/dL — ABNORMAL HIGH (ref 6–23)
CHLORIDE: 103 meq/L (ref 96–112)
CO2: 25 meq/L (ref 19–32)
Calcium: 9.3 mg/dL (ref 8.4–10.5)
Creatinine, Ser: 1.24 mg/dL — ABNORMAL HIGH (ref 0.50–1.10)
GFR calc Af Amer: 49 mL/min — ABNORMAL LOW (ref >90)
GFR calc non Af Amer: 42 mL/min — ABNORMAL LOW (ref >90)
Glucose, Bld: 197 mg/dL — ABNORMAL HIGH (ref 70–99)
POTASSIUM: 4.7 meq/L (ref 3.7–5.3)
Sodium: 141 mEq/L (ref 137–147)

## 2013-08-03 LAB — VITAMIN B12: Vitamin B-12: 357 pg/mL (ref 211–911)

## 2013-08-03 LAB — HEPARIN LEVEL (UNFRACTIONATED): Heparin Unfractionated: 0.42 IU/mL (ref 0.30–0.70)

## 2013-08-03 LAB — GLUCOSE, CAPILLARY
GLUCOSE-CAPILLARY: 140 mg/dL — AB (ref 70–99)
GLUCOSE-CAPILLARY: 199 mg/dL — AB (ref 70–99)
GLUCOSE-CAPILLARY: 203 mg/dL — AB (ref 70–99)
Glucose-Capillary: 170 mg/dL — ABNORMAL HIGH (ref 70–99)

## 2013-08-03 LAB — FOLATE: Folate: >20 ng/mL

## 2013-08-03 LAB — FERRITIN: Ferritin: 94 ng/mL (ref 10–291)

## 2013-08-03 MED ORDER — CARVEDILOL 25 MG PO TABS
25.0000 mg | ORAL_TABLET | Freq: Two times a day (BID) | ORAL | Status: DC
  Administered 2013-08-03 – 2013-08-10 (×12): 25 mg via ORAL
  Filled 2013-08-03 (×19): qty 1

## 2013-08-03 MED ORDER — CALCIUM CARBONATE-VITAMIN D 500-200 MG-UNIT PO TABS
1.0000 | ORAL_TABLET | Freq: Every day | ORAL | Status: DC
  Administered 2013-08-03 – 2013-08-10 (×7): 1 via ORAL
  Filled 2013-08-03 (×10): qty 1

## 2013-08-03 MED ORDER — NEPRO/CARBSTEADY PO LIQD
237.0000 mL | Freq: Three times a day (TID) | ORAL | Status: DC
  Administered 2013-08-03 – 2013-08-10 (×15): 237 mL via ORAL
  Filled 2013-08-03 (×29): qty 237

## 2013-08-03 MED ORDER — LISINOPRIL 2.5 MG PO TABS
2.5000 mg | ORAL_TABLET | Freq: Every day | ORAL | Status: DC
  Administered 2013-08-03 – 2013-08-06 (×4): 2.5 mg via ORAL
  Filled 2013-08-03 (×6): qty 1

## 2013-08-03 NOTE — Progress Notes (Signed)
ANTICOAGULATION CONSULT NOTE - follow up  Pharmacy Consult for Heparin Indication: Hx DVT  Allergies  Allergen Reactions  . Codeine Nausea Only  . Morphine And Related Nausea And Vomiting    Patient Measurements: Height: 5' (152.4 cm) Weight: 202 lb 13.2 oz (92 kg) (scale A) IBW/kg (Calculated) : 45.5   Vital Signs: Temp: 97.8 F (36.6 C) (05/14 0429) Temp src: Oral (05/14 0429) BP: 161/43 mmHg (05/14 0429) Pulse Rate: 55 (05/14 0723)  Labs:  Recent Labs  07/31/13 1104  08/01/13 0406 08/01/13 0931 08/02/13 0800 08/03/13 0429  HGB  --   < > 8.7*  --  9.2* 10.2*  HCT  --   --  27.4*  --  29.0* 32.5*  PLT  --   --  176  --  198 233  HEPARINUNFRC 0.30  --  0.46  --  0.45 0.42  CREATININE  --   --   --  1.59* 1.60*  --   TROPONINI <0.30  --   --   --   --   --   < > = values in this interval not displayed.  Estimated Creatinine Clearance: 31.7 ml/min (by C-G formula based on Cr of 1.6).    Assessment: 33 YOF on warfarin PTA for hx DVT reversed with Vit K on 07/23/13 during a previous admission. The patient was scheduled for a R iliac stent vs femoral-femoral bypass graft on 5/11 and Heparin was started for bridging. OR plans have been postponed due to HF exacerbation - plans now are to pursue the surgery next week.   Heparin level this morning remains therapeutic (HL 0.452, goal of 0.3-0.7). Hgb/Hct okay - trending up, plts wnl. No overt s/sx of bleeding noted.    Goal of Therapy:  Heparin level 0.3-0.7 units/ml Monitor platelets by anticoagulation protocol: Yes   Plan:  1. Continue Heparin at the current rate of 1300 units/hr (13 ml/hr) 2. Will f/u surgery plans - on schedule for Monday 5/18 3. Will continue to monitor for any signs/symptoms of bleeding and will follow up with heparin level in the a.m.   Eudelia Bunch, Pharm.D. [phone] 08/03/2013 9:59 AM

## 2013-08-03 NOTE — Progress Notes (Signed)
UR completed Gertrude Tarbet K. Massimiliano Rohleder, RN, BSN, MSHL, CCM  08/03/2013 10:26 AM

## 2013-08-03 NOTE — Progress Notes (Signed)
[name] PROGRESS NOTE Interim History: 73 year old female with a past medical history of type 2 diabetes mellitus, peripheral arterial disease, hypertension, admitted to the medicine service on 07/30/2013. She was scheduled to undergo fem-fem bypass on 07/31/2013, procedure to be performed by Dr. Orland Jarred of vascular. she presented to the emergency department on 07/30/2013 with complaints of increasing shortness of breath and worsening bilateral extremity pain edema. Chest x-ray on admission revealed findings suggestive of CHF/fluid overload. Labs also revealed the presence of acute renal failure with creatinine of 2.12. Patient was started on IV Lasix 40 mg twice a day. She showed clinical improvement and by 08/01/2013 her weight had come down from 97.8 kg to 95.3 kg. With regard to peripheral arterial disease and she was started on IV heparin and has been evaluated by Dr. Orland Jarred during this hospitalization. procedure has been postponed due to acute on chronic diastolic heart failure.   Filed Weights   08/01/13 0429 08/02/13 0430 08/03/13 0429  Weight: 95.3 kg (210 lb 1.6 oz) 94.62 kg (208 lb 9.6 oz) 92 kg (202 lb 13.2 oz)        Intake/Output Summary (Last 24 hours) at 08/03/13 6759 Last data filed at 08/02/13 2245  Gross per 24 hour  Intake    870 ml  Output   2677 ml  Net  -1807 ml     Assessment/Plan: Acute on chronic diastolic congestive heart failure - Recent transthoracic echocardiogram performed 07/24/2013 showing preserved ejection fraction of 60-65%, E/e'>10 suggestive of elevated filling pressures - Cont to be negative, weight down from 97->92.6 kg (dry weight 88 kg)  - Will continue IV Lasix at 40 mg twice a day, pending BMP.  Acute on chronic renal failure/Cardiorenal syndrome:  - Baseline creatinine of around 1.3-1.6, cr.  - improved with Diuresis. - Follow kidney function   Peripheral Arterial Disease  -Patient had been scheduled for Fem-Fem bypass today,  however this will need to be postponed due to acute CHF  -Dr Oneida Alar of vascular surgery evaluated patient  -Continue IV heparin, plan for suregry early next week.  Hypertension  -Blood pressures stable, remains on Norvasc 10 mg and Coreg 25 mg PO BID   Hyperkalemia  -Improving with diuresis and kayexelate.  Code Status: Full Code  Family Communication: Spoke with daughters present at bedside  Disposition Plan: Continue IV diuresis   Consultants:  vascular  Procedures: ECHO: as above  Antibiotics:  None  HPI/Subjective: No complains had a better night sleep.  Objective: Filed Vitals:   08/02/13 2245 08/02/13 2300 08/03/13 0429 08/03/13 0723  BP: 168/35  161/43   Pulse: 62 64 55 55  Temp:   97.8 F (36.6 C)   TempSrc:   Oral   Resp:  18 18 18   Height:      Weight:   92 kg (202 lb 13.2 oz)   SpO2: 96% 98% 99% 97%     Exam:  General: Alert, awake, oriented x3, in no acute distress.  HEENT: No bruits, no goiter. -JVD Heart: Regular rate and rhythm, without murmurs, rubs, gallops.  Lungs: Good air movement, clear Abdomen: Soft, nontender, nondistended, positive bowel sounds.    Data Reviewed: Basic Metabolic Panel:  Recent Labs Lab 07/27/13 1355 07/30/13 1931 07/31/13 0440 08/01/13 0931 08/02/13 0800  NA  --  130* 135* 135* 140  K  --  5.7* 5.7* 5.7* 5.1  CL  --  97 104 103 104  CO2  --  17* 16* 16* 24  GLUCOSE  --  133* 70 111* 107*  BUN  --  58* 58* 51* 49*  CREATININE 1.32* 2.12* 2.00* 1.59* 1.60*  CALCIUM  --  8.9 8.4 8.9 8.9   Liver Function Tests:  Recent Labs Lab 07/31/13 0440  AST 24  ALT 15  ALKPHOS 37*  BILITOT <0.2*  PROT 6.3  ALBUMIN 2.4*   No results found for this basename: LIPASE, AMYLASE,  in the last 168 hours No results found for this basename: AMMONIA,  in the last 168 hours CBC:  Recent Labs Lab 07/30/13 1931 07/31/13 0440 08/01/13 0406 08/02/13 0800 08/03/13 0429  WBC 7.5 6.6 7.6 7.6 8.5  NEUTROABS  --  3.5   --   --   --   HGB 10.0* 8.8* 8.7* 9.2* 10.2*  HCT 31.4* 27.5* 27.4* 29.0* 32.5*  MCV 92.9 92.6 93.5 93.5 93.7  PLT 195 176 176 198 233   Cardiac Enzymes:  Recent Labs Lab 07/30/13 1931 07/30/13 2342 07/31/13 0440 07/31/13 1104  TROPONINI <0.30 <0.30 <0.30 <0.30   BNP (last 3 results)  Recent Labs  07/30/13 1931  PROBNP 3021.0*   CBG:  Recent Labs Lab 08/02/13 0547 08/02/13 1145 08/02/13 1720 08/02/13 2034 08/03/13 0706  GLUCAP 112* 198* 160* 210* 140*    No results found for this or any previous visit (from the past 240 hour(s)).   Studies: No results found.  Scheduled Meds: . amLODipine  10 mg Oral Daily  . atorvastatin  80 mg Oral q1800  . calcium-vitamin D  1 tablet Oral Q breakfast  . carvedilol  25 mg Oral BID WC  . escitalopram  20 mg Oral Daily  . feeding supplement (NEPRO CARB STEADY)  237 mL Oral TID WC  . furosemide  40 mg Intravenous BID  . gabapentin  100 mg Oral Daily  . gabapentin  200 mg Oral QHS  . insulin aspart  0-9 Units Subcutaneous TID WC  . insulin glargine  13 Units Subcutaneous QHS  . sodium chloride  3 mL Intravenous Q12H  . sodium chloride  3 mL Intravenous Q12H  . tiotropium  18 mcg Inhalation Daily   Continuous Infusions: . heparin 1,300 Units/hr (08/02/13 1132)     [place] [facility] Pager [phone] If 8PM-8AM, please contact night-coverage at [email or web address], password [facility] 08/03/2013, 8:38 AM  LOS: 4 days

## 2013-08-03 NOTE — Progress Notes (Signed)
Pt seen, found already wearing hospital-provided cpap with her nasal pillows and tubing from home.  Cpap set at 12cm h2o per her home settings.  Sterile water added to humidity chamber per her request.  Pt tolerating cpap well at this time.

## 2013-08-04 LAB — CBC
HCT: 28.9 % — ABNORMAL LOW (ref 36.0–46.0)
HEMOGLOBIN: 9.1 g/dL — AB (ref 12.0–15.0)
MCH: 29.7 pg (ref 26.0–34.0)
MCHC: 31.5 g/dL (ref 30.0–36.0)
MCV: 94.4 fL (ref 78.0–100.0)
Platelets: 183 10*3/uL (ref 150–400)
RBC: 3.06 MIL/uL — ABNORMAL LOW (ref 3.87–5.11)
RDW: 15.7 % — AB (ref 11.5–15.5)
WBC: 7.5 10*3/uL (ref 4.0–10.5)

## 2013-08-04 LAB — BASIC METABOLIC PANEL
BUN: 40 mg/dL — AB (ref 6–23)
CO2: 24 mEq/L (ref 19–32)
Calcium: 9 mg/dL (ref 8.4–10.5)
Chloride: 102 mEq/L (ref 96–112)
Creatinine, Ser: 1.21 mg/dL — ABNORMAL HIGH (ref 0.50–1.10)
GFR calc Af Amer: 50 mL/min — ABNORMAL LOW (ref >90)
GFR, EST NON AFRICAN AMERICAN: 43 mL/min — AB (ref >90)
GLUCOSE: 104 mg/dL — AB (ref 70–99)
Potassium: 4.3 mEq/L (ref 3.7–5.3)
Sodium: 138 mEq/L (ref 137–147)

## 2013-08-04 LAB — GLUCOSE, CAPILLARY
GLUCOSE-CAPILLARY: 96 mg/dL (ref 70–99)
GLUCOSE-CAPILLARY: 177 mg/dL — AB (ref 70–99)
GLUCOSE-CAPILLARY: 175 mg/dL — AB (ref 70–99)
Glucose-Capillary: 195 mg/dL — ABNORMAL HIGH (ref 70–99)

## 2013-08-04 LAB — HEPARIN LEVEL (UNFRACTIONATED): Heparin Unfractionated: 0.31 IU/mL (ref 0.30–0.70)

## 2013-08-04 MED ORDER — DEXTROSE 5 % IV SOLN
1.5000 g | INTRAVENOUS | Status: DC
  Filled 2013-08-04: qty 1.5

## 2013-08-04 MED ORDER — FUROSEMIDE 10 MG/ML IJ SOLN
40.0000 mg | Freq: Two times a day (BID) | INTRAMUSCULAR | Status: DC
  Administered 2013-08-04 (×2): 40 mg via INTRAVENOUS
  Filled 2013-08-04 (×2): qty 4

## 2013-08-04 MED ORDER — FUROSEMIDE 10 MG/ML IJ SOLN: 60.0000 mg | Freq: Two times a day (BID) | INTRAMUSCULAR | Status: DC

## 2013-08-04 NOTE — Progress Notes (Addendum)
  [facility] Progress Note  08/04/2013 9:55 AM Hospital Day 5  Subjective:  Feeling much better today.    Filed Vitals:   08/04/13 0932  BP: 155/48  Pulse:   Temp:   Resp:     Physical Exam:  Lungs:  Non labored-on RA Extremities:  2+ right DP with some edema; monophasic doppler signal left DP/PT; ulcer and lateral aspect of left foot slightly improved.  Ulcer on calf stable and has not worsened.  CBC    Component Value Date/Time   WBC 7.5 08/04/2013 0415   RBC 3.06* 08/04/2013 0415   RBC 3.19* 08/03/2013 1013   HGB 9.1* 08/04/2013 0415   HCT 28.9* 08/04/2013 0415   PLT 183 08/04/2013 0415   MCV 94.4 08/04/2013 0415   MCH 29.7 08/04/2013 0415   MCHC 31.5 08/04/2013 0415   RDW 15.7* 08/04/2013 0415   LYMPHSABS 1.7 07/31/2013 0440   MONOABS 1.1* 07/31/2013 0440   EOSABS 0.3 07/31/2013 0440   BASOSABS 0.0 07/31/2013 0440    BMET    Component Value Date/Time   NA 138 08/04/2013 0415   K 4.3 08/04/2013 0415   CL 102 08/04/2013 0415   CO2 24 08/04/2013 0415   GLUCOSE 104* 08/04/2013 0415   BUN 40* 08/04/2013 0415   CREATININE 1.21* 08/04/2013 0415   CALCIUM 9.0 08/04/2013 0415   GFRNONAA 43* 08/04/2013 0415   GFRAA 50* 08/04/2013 0415    INR    Component Value Date/Time   INR 1.05 07/30/2013 1931     Intake/Output Summary (Last 24 hours) at 08/04/13 0955 Last data filed at 08/04/13 0900  Gross per 24 hour  Intake 1876.74 ml  Output   1956 ml  Net -79.26 ml     Assessment/Plan:  73 y.o. female is  who has severe PAD with prior right iliac stent with need for fem fem bypass admitted with CHF  Hospital Day 5  -pt states she feels good today -left leg soreness improved.  Dorsum ulcer on left lateral foot improved.  Ulcers on calf have not worsened -creatinine significantly improved at 1.21 today -continue heparin -plan for surgery on Monday    Samantha Rhyne, [place] [facility] [phone] 08/04/2013 9:55 AM   Attempt left  iliac stent possible fem fem bypass Monday  Ruta Hinds, MD [facility] [place] Office: [phone] Pager: [phone]

## 2013-08-04 NOTE — Progress Notes (Signed)
Service response called and request made for [employer] to inspect patient's home cpap machine.

## 2013-08-04 NOTE — Progress Notes (Signed)
[name] PROGRESS NOTE Interim History: 73 year old female with a past medical history of type 2 diabetes mellitus, peripheral arterial disease, hypertension, admitted to the medicine service on 07/30/2013. She was scheduled to undergo fem-fem bypass on 07/31/2013, procedure to be performed by Dr. Orland Jarred of vascular. she presented to the emergency department on 07/30/2013 with complaints of increasing shortness of breath and worsening bilateral extremity pain edema. Chest x-ray on admission revealed findings suggestive of CHF/fluid overload. Labs also revealed the presence of acute renal failure with creatinine of 2.12. Patient was started on IV Lasix 40 mg twice a day. She showed clinical improvement and by 08/01/2013 her weight had come down from 97.8 kg to 95.3 kg. With regard to peripheral arterial disease and she was started on IV heparin and has been evaluated by Dr. Orland Jarred during this hospitalization. procedure has been postponed due to acute on chronic diastolic heart failure.   Filed Weights   08/02/13 0430 08/03/13 0429 08/04/13 0500  Weight: 94.62 kg (208 lb 9.6 oz) 92 kg (202 lb 13.2 oz) 90.266 kg (199 lb)        Intake/Output Summary (Last 24 hours) at 08/04/13 0835 Last data filed at 08/04/13 0506  Gross per 24 hour  Intake 1876.74 ml  Output   2556 ml  Net -679.26 ml     Assessment/Plan: Acute on chronic diastolic congestive heart failure - Recent transthoracic echocardiogram performed 07/24/2013 showing preserved ejection fraction of 60-65%, E/e'>10 suggestive of elevated filling pressures - Cont to be negative, weight down from 97->90.6 kg (dry weight 88 kg)  - Will continue IV Lasix at 40 mg twice a day, pending BMP.  Acute on chronic renal failure/Cardiorenal syndrome:  - Baseline creatinine of around 1.3-1.6, Cr.  - Improved with Diuresis. - Follow kidney function   Peripheral Arterial Disease  -Patient had been scheduled for Fem-Fem bypass today,  however this will need to be postponed due to acute CHF  -Dr Oneida Alar of vascular surgery evaluated patient  -Continue IV heparin, plan for suregry on 5.18.2015.  Hypertension  -Blood pressures stable, remains on Norvasc 10 mg and Coreg 25 mg PO BID   Hyperkalemia  -Improving with diuresis and kayexelate.  Code Status: Full Code  Family Communication: Spoke with daughters present at bedside  Disposition Plan: Continue IV diuresis   Consultants:  vascular  Procedures: ECHO: as above  Antibiotics:  None  HPI/Subjective: No complains had a better night sleep.  Objective: Filed Vitals:   08/03/13 1051 08/03/13 1441 08/03/13 2045 08/04/13 0500  BP: 147/51 165/53 166/44 142/38  Pulse: 54 57 60 50  Temp: 97.8 F (36.6 C) 98.7 F (37.1 C) 98.6 F (37 C) 98.1 F (36.7 C)  TempSrc: Oral Oral Oral Oral  Resp:  18 18 19   Height:      Weight:    90.266 kg (199 lb)  SpO2: 98% 93% 94% 92%     Exam:  General: Alert, awake, oriented x3, in no acute distress.  HEENT: No bruits, no goiter. -JVD Heart: Regular rate and rhythm, without murmurs, rubs, gallops.  Lungs: Good air movement, clear Abdomen: Soft, nontender, nondistended, positive bowel sounds.    Data Reviewed: Basic Metabolic Panel:  Recent Labs Lab 07/31/13 0440 08/01/13 0931 08/02/13 0800 08/03/13 1013 08/04/13 0415  NA 135* 135* 140 141 138  K 5.7* 5.7* 5.1 4.7 4.3  CL 104 103 104 103 102  CO2 16* 16* 24 25 24   GLUCOSE 70 111* 107* 197* 104*  BUN  58* 51* 49* 43* 40*  CREATININE 2.00* 1.59* 1.60* 1.24* 1.21*  CALCIUM 8.4 8.9 8.9 9.3 9.0   Liver Function Tests:  Recent Labs Lab 07/31/13 0440  AST 24  ALT 15  ALKPHOS 37*  BILITOT <0.2*  PROT 6.3  ALBUMIN 2.4*   No results found for this basename: LIPASE, AMYLASE,  in the last 168 hours No results found for this basename: AMMONIA,  in the last 168 hours CBC:  Recent Labs Lab 07/30/13 1931 07/31/13 0440 08/01/13 0406 08/02/13 0800  08/03/13 0429 08/04/13 0415  WBC 7.5 6.6 7.6 7.6 8.5 7.5  NEUTROABS  --  3.5  --   --   --   --   HGB 10.0* 8.8* 8.7* 9.2* 10.2* 9.1*  HCT 31.4* 27.5* 27.4* 29.0* 32.5* 28.9*  MCV 92.9 92.6 93.5 93.5 93.7 94.4  PLT 195 176 176 198 233 183   Cardiac Enzymes:  Recent Labs Lab 07/30/13 1931 07/30/13 2342 07/31/13 0440 07/31/13 1104  TROPONINI <0.30 <0.30 <0.30 <0.30   BNP (last 3 results)  Recent Labs  07/30/13 1931  PROBNP 3021.0*   CBG:  Recent Labs Lab 08/03/13 0706 08/03/13 1134 08/03/13 1618 08/03/13 2127 08/04/13 0548  GLUCAP 140* 199* 203* 170* 96    No results found for this or any previous visit (from the past 240 hour(s)).   Studies: No results found.  Scheduled Meds: . amLODipine  10 mg Oral Daily  . atorvastatin  80 mg Oral q1800  . calcium-vitamin D  1 tablet Oral Q breakfast  . carvedilol  25 mg Oral BID WC  . escitalopram  20 mg Oral Daily  . feeding supplement (NEPRO CARB STEADY)  237 mL Oral TID WC  . furosemide  40 mg Intravenous BID  . gabapentin  100 mg Oral Daily  . gabapentin  200 mg Oral QHS  . insulin aspart  0-9 Units Subcutaneous TID WC  . insulin glargine  13 Units Subcutaneous QHS  . lisinopril  2.5 mg Oral Daily  . sodium chloride  3 mL Intravenous Q12H  . sodium chloride  3 mL Intravenous Q12H  . tiotropium  18 mcg Inhalation Daily   Continuous Infusions: . heparin 1,300 Units/hr (08/03/13 1127)     [facility] Pager [phone] If 8PM-8AM, please contact night-coverage at [email or web address], password [facility] 08/04/2013, 8:35 AM  LOS: 5 days

## 2013-08-04 NOTE — Progress Notes (Signed)
ANTICOAGULATION CONSULT NOTE - Follow-up  Pharmacy Consult for Heparin Indication: Hx DVT  Allergies  Allergen Reactions  . Codeine Nausea Only  . Morphine And Related Nausea And Vomiting    Patient Measurements: Height: 5' (152.4 cm) Weight: 199 lb (90.266 kg) (A SCALE) IBW/kg (Calculated) : 45.5   Vital Signs: Temp: 98.1 F (36.7 C) (05/15 0500) Temp src: Oral (05/15 0500) BP: 142/38 mmHg (05/15 0500) Pulse Rate: 50 (05/15 0500)  Labs:  Recent Labs  08/02/13 0800 08/03/13 0429 08/03/13 1013 08/04/13 0415  HGB 9.2* 10.2*  --  9.1*  HCT 29.0* 32.5*  --  28.9*  PLT 198 233  --  183  HEPARINUNFRC 0.45 0.42  --  0.31  CREATININE 1.60*  --  1.24* 1.21*    Estimated Creatinine Clearance: 41.4 ml/min (by C-G formula based on Cr of 1.21).    Assessment: 84 YOF on warfarin PTA for hx DVT reversed with Vit K on 07/23/13 during a previous admission. The patient was scheduled for a R iliac stent vs femoral-femoral bypass graft on 5/11 and Heparin was started for bridging. OR plans have been postponed due to HF exacerbation - plans now are to pursue the surgery next week.   Heparin level this morning remains therapeutic (HL 0.31 << 0.42, goal of 0.3-0.7). Hgb/Hct/Plt slight drop - no overt s/sx of bleeding noted.    Goal of Therapy:  Heparin level 0.3-0.7 units/ml Monitor platelets by anticoagulation protocol: Yes   Plan:  1. Continue Heparin at the current rate of 1300 units/hr (13 ml/hr) 2. Will f/u surgery plans - on schedule for Monday 5/18 3. Will continue to monitor for any signs/symptoms of bleeding and will follow up with heparin level in the a.m.   Alycia Rossetti, PharmD, BCPS Clinical Pharmacist Pager: [phone] 08/04/2013 8:29 AM

## 2013-08-05 LAB — CBC
HCT: 31.8 % — ABNORMAL LOW (ref 36.0–46.0)
Hemoglobin: 10 g/dL — ABNORMAL LOW (ref 12.0–15.0)
MCH: 29.5 pg (ref 26.0–34.0)
MCHC: 31.4 g/dL (ref 30.0–36.0)
MCV: 93.8 fL (ref 78.0–100.0)
PLATELETS: 191 10*3/uL (ref 150–400)
RBC: 3.39 MIL/uL — AB (ref 3.87–5.11)
RDW: 15.4 % (ref 11.5–15.5)
WBC: 6.9 10*3/uL (ref 4.0–10.5)

## 2013-08-05 LAB — BASIC METABOLIC PANEL
BUN: 34 mg/dL — ABNORMAL HIGH (ref 6–23)
CHLORIDE: 103 meq/L (ref 96–112)
CO2: 23 meq/L (ref 19–32)
Calcium: 9.2 mg/dL (ref 8.4–10.5)
Creatinine, Ser: 1.1 mg/dL (ref 0.50–1.10)
GFR calc Af Amer: 56 mL/min — ABNORMAL LOW (ref >90)
GFR calc non Af Amer: 49 mL/min — ABNORMAL LOW (ref >90)
Glucose, Bld: 198 mg/dL — ABNORMAL HIGH (ref 70–99)
Potassium: 4.7 mEq/L (ref 3.7–5.3)
SODIUM: 142 meq/L (ref 137–147)

## 2013-08-05 LAB — HEPARIN LEVEL (UNFRACTIONATED): Heparin Unfractionated: 0.38 IU/mL (ref 0.30–0.70)

## 2013-08-05 LAB — GLUCOSE, CAPILLARY
GLUCOSE-CAPILLARY: 192 mg/dL — AB (ref 70–99)
GLUCOSE-CAPILLARY: 190 mg/dL — AB (ref 70–99)
Glucose-Capillary: 119 mg/dL — ABNORMAL HIGH (ref 70–99)
Glucose-Capillary: 178 mg/dL — ABNORMAL HIGH (ref 70–99)

## 2013-08-05 MED ORDER — MUPIROCIN 2 % EX OINT
TOPICAL_OINTMENT | Freq: Two times a day (BID) | CUTANEOUS | Status: DC
  Filled 2013-08-05: qty 22

## 2013-08-05 MED ORDER — FUROSEMIDE 40 MG PO TABS
40.0000 mg | ORAL_TABLET | Freq: Every day | ORAL | Status: DC
  Administered 2013-08-05: 40 mg via ORAL
  Filled 2013-08-05 (×2): qty 1

## 2013-08-05 MED ORDER — SODIUM CHLORIDE 0.9 % IV SOLN: INTRAVENOUS | Status: DC

## 2013-08-05 NOTE — Progress Notes (Signed)
Physical Therapy Evaluation Patient Details Name: Tenleigh Byer MRN: [id number] DOB: Apr 25, 1940 Today's Date: 08/05/2013   History of Present Illness  Patient is a 73 yo female admitted 07/30/13 with SOB and edema.  Patient with CHF.  Was scheduled for LLE bypass graft on 5/11 - cancelled due to admit.  Patient with LLE pain and non-healing wounds LLE/foot.  Patient with h/o DM, PAD, and HTN.  Clinical Impression  Patient presents with problems listed below.  Will benefit from acute PT to maximize independence prior to return home with granddaughter.      Follow Up Recommendations Home health PT;Supervision - Intermittent    Equipment Recommendations  None recommended by PT    Recommendations for Other Services       Precautions / Restrictions Precautions Precautions: Fall Precaution Comments: ischemic Lt foot Restrictions Weight Bearing Restrictions: No      Mobility  Bed Mobility Overal bed mobility: Needs Assistance Bed Mobility: Supine to Sit     Supine to sit: Supervision     General bed mobility comments: Patient able to move to sitting with use of bedrails.  Transfers Overall transfer level: Needs assistance Equipment used: Rolling walker (2 wheeled) Transfers: Sit to/from Stand Sit to Stand: Min guard         General transfer comment: Verbal cues for hand placement.  Assist for safety/balance.  Patient somewhat impulsive.  Needed reminders to keep RW with her at all times with gait.  Left at foot of bed and attempted to reach for bedrail to sit, lost balance requiring assist to prevent fall.  Reviewed safety/technique with patient.  Ambulation/Gait Ambulation/Gait assistance: Min guard Ambulation Distance (Feet): 120 Feet Assistive device: Rolling walker (2 wheeled) Gait Pattern/deviations: Step-through pattern;Decreased stride length Gait velocity: Decreased, but attempts to ambulate quickly, losing balance.  Cues to move at safe pace. Gait velocity  interpretation: Below normal speed for age/gender General Gait Details: Verbal cues for safe use of RW.  Cues to move at safe pace and keep RW with her at all times.  Stairs            Wheelchair Mobility    Modified Rankin (Stroke Patients Only)       Balance                                             Pertinent Vitals/Pain     Home Living Family/patient expects to be discharged to:: Private residence Living Arrangements: Other relatives (Granddaughter and her boyfriend) Available Help at Discharge: Family;Available PRN/intermittently Type of Home: House Home Access: Ramped entrance     Home Layout: One level Home Equipment: [place] - 4 wheels;Electric scooter;Shower seat Additional Comments: granddaughter is CNA and (A) patient. Lives with granddaughter the CNA    Prior Function Level of Independence: Needs assistance   Gait / Transfers Assistance Needed: uses hover round, limited to household distance due to pain  ADL's / Homemaking Assistance Needed: Granddaughter assists with bathing and meal prep        Hand Dominance   Dominant Hand: Right    Extremity/Trunk Assessment   Upper Extremity Assessment: Overall WFL for tasks assessed           Lower Extremity Assessment: Generalized weakness;LLE deficits/detail   LLE Deficits / Details: Reddness and pain at toes, wounds on foot and posterior calf.     Communication  Communication: HOH  Cognition Arousal/Alertness: Awake/alert Behavior During Therapy: WFL for tasks assessed/performed;Impulsive Overall Cognitive Status: Within Functional Limits for tasks assessed (Decreased safety awareness/judgement)                      General Comments      Exercises        Assessment/Plan    PT Assessment Patient needs continued PT services  PT Diagnosis Difficulty walking;Abnormality of gait;Acute pain   PT Problem List Decreased strength;Decreased activity  tolerance;Decreased balance;Decreased mobility;Decreased knowledge of use of DME;Decreased safety awareness;Pain  PT Treatment Interventions DME instruction;Gait training;Functional mobility training;Patient/family education   PT Goals (Current goals can be found in the Care Plan section) Acute Rehab PT Goals Patient Stated Goal: To get home.  To have my surgery soon PT Goal Formulation: With patient/family Time For Goal Achievement: 08/12/13 Potential to Achieve Goals: Good    Frequency Min 3X/week   Barriers to discharge Decreased caregiver support Alone during day while granddaughter works    Co-evaluation               End of Session Equipment Utilized During Treatment: Gait belt Activity Tolerance: Patient limited by pain Patient left: in chair;with call bell/phone within reach;with family/visitor present Nurse Communication: Mobility status         Time: [phone] PT Time Calculation (min): 28 min   Charges:   PT Evaluation $Initial PT Evaluation Tier I: 1 Procedure PT Treatments $Gait Training: 8-22 mins   PT G Codes:          Despina Pole 2013/08/14, 5:15 PM Carita Pian. Sanjuana Kava, [place] Pager [phone]

## 2013-08-05 NOTE — Progress Notes (Signed)
ANTICOAGULATION CONSULT NOTE - Follow-up  Pharmacy Consult for Heparin Indication: Hx DVT  Allergies  Allergen Reactions  . Codeine Nausea Only  . Morphine And Related Nausea And Vomiting    Patient Measurements: Height: 5' (152.4 cm) Weight: 195 lb 4.8 oz (88.587 kg) (scale A) IBW/kg (Calculated) : 45.5   Vital Signs: Temp: 98.3 F (36.8 C) (05/16 0618) Temp src: Oral (05/16 0618) BP: 154/80 mmHg (05/16 0618) Pulse Rate: 54 (05/16 0618)  Labs:  Recent Labs  08/03/13 0429 08/03/13 1013 08/04/13 0415 08/05/13 1009  HGB 10.2*  --  9.1* 10.0*  HCT 32.5*  --  28.9* 31.8*  PLT 233  --  183 191  HEPARINUNFRC 0.42  --  0.31 0.38  CREATININE  --  1.24* 1.21* 1.10    Estimated Creatinine Clearance: 45.1 ml/min (by C-G formula based on Cr of 1.1).    Assessment: 60 YOF on warfarin PTA for hx DVT reversed with Vit K on 07/23/13 during a previous admission. The patient was scheduled for a R iliac stent vs femoral-femoral bypass graft on 5/11 and Heparin was started for bridging. OR plans have been postponed due to HF exacerbation - plans now are to pursue the surgery next week scheduled for 5/18.   HL remains therapeutic 0.38. Hgb 10, Plts 191 No oert bleeding noted.    Goal of Therapy:  Heparin level 0.3-0.7 units/ml Monitor platelets by anticoagulation protocol: Yes   Plan:  Continue heparin 1300 units/ hr F/u HL in the AM F/u schedule for OR 5/18  Haivyn Oravec B. Leitha Schuller, PharmD Clinical Pharmacist - Resident Phone: [phone] Pager: [phone] 08/05/2013 1:19 PM

## 2013-08-05 NOTE — Progress Notes (Signed)
[name] PROGRESS NOTE Interim History: 73 year old female with a past medical history of type 2 diabetes mellitus, peripheral arterial disease, hypertension, admitted to the medicine service on 07/30/2013. She was scheduled to undergo fem-fem bypass on 07/31/2013, procedure to be performed by Dr. Orland Jarred of vascular. she presented to the emergency department on 07/30/2013 with complaints of increasing shortness of breath and worsening bilateral extremity pain edema. Chest x-ray on admission revealed findings suggestive of CHF/fluid overload. Labs also revealed the presence of acute renal failure with creatinine of 2.12. Patient was started on IV Lasix 40 mg twice a day. She showed clinical improvement and by 08/01/2013 her weight had come down from 97.8 kg to 95.3 kg. With regard to peripheral arterial disease and she was started on IV heparin and has been evaluated by Dr. Orland Jarred during this hospitalization. procedure has been postponed due to acute on chronic diastolic heart failure.   Filed Weights   08/03/13 0429 08/04/13 0500 08/05/13 0618  Weight: 92 kg (202 lb 13.2 oz) 90.266 kg (199 lb) 88.587 kg (195 lb 4.8 oz)        Intake/Output Summary (Last 24 hours) at 08/05/13 0745 Last data filed at 08/04/13 1830  Gross per 24 hour  Intake    630 ml  Output    601 ml  Net     29 ml     Assessment/Plan: Acute on chronic diastolic congestive heart failure - Recent transthoracic echocardiogram performed 07/24/2013 showing preserved ejection fraction of 60-65%, E/e'>10 suggestive of elevated filling pressures - Cont to be negative, weight down from 97->90.6-> 88 kg (dry weight 88 kg)  - Change lasix to orals. - cont Betablocker, ACE-I and diuretics.  Acute on chronic renal failure/Cardiorenal syndrome:  - Baseline creatinine of around 1.3-1.6, Cr.  - Improved with Diuresis. - At baseline.  Peripheral Arterial Disease  -Patient had been scheduled for Fem-Fem bypass today,  however this will need to be postponed due to acute CHF  -Dr Oneida Alar of vascular surgery evaluated patient  -Continue IV heparin, plan for suregry on 5.18.2015.  Hypertension  - Blood pressures stable,  - Remains on Norvasc, low dose ACE-I and Coreg 25 mg PO BID.   Hyperkalemia  -Improved with diuresis and kayexelate.  Code Status: Full Code  Family Communication: Spoke with daughters present at bedside  Disposition Plan: Continue IV diuresis   Consultants:  vascular  Procedures: ECHO: as above  Antibiotics:  None  HPI/Subjective: No complains had a better night sleep.  Objective: Filed Vitals:   08/04/13 1300 08/04/13 2048 08/05/13 0014 08/05/13 0618  BP: 145/30 174/44  154/80  Pulse: 61 65 64 54  Temp: 99.1 F (37.3 C) 98.9 F (37.2 C)  98.3 F (36.8 C)  TempSrc: Oral Oral  Oral  Resp: 18 20  18   Height:      Weight:    88.587 kg (195 lb 4.8 oz)  SpO2: 93% 96%  94%     Exam:  General: Alert, awake, oriented x3, in no acute distress.  HEENT: No bruits, no goiter. -JVD Heart: Regular rate and rhythm, without murmurs, rubs, gallops.  Lungs: Good air movement, clear Abdomen: Soft, nontender, nondistended, positive bowel sounds.    Data Reviewed: Basic Metabolic Panel:  Recent Labs Lab 07/31/13 0440 08/01/13 0931 08/02/13 0800 08/03/13 1013 08/04/13 0415  NA 135* 135* 140 141 138  K 5.7* 5.7* 5.1 4.7 4.3  CL 104 103 104 103 102  CO2 16* 16* 24 25 24  GLUCOSE 70 111* 107* 197* 104*  BUN 58* 51* 49* 43* 40*  CREATININE 2.00* 1.59* 1.60* 1.24* 1.21*  CALCIUM 8.4 8.9 8.9 9.3 9.0   Liver Function Tests:  Recent Labs Lab 07/31/13 0440  AST 24  ALT 15  ALKPHOS 37*  BILITOT <0.2*  PROT 6.3  ALBUMIN 2.4*   No results found for this basename: LIPASE, AMYLASE,  in the last 168 hours No results found for this basename: AMMONIA,  in the last 168 hours CBC:  Recent Labs Lab 07/30/13 1931 07/31/13 0440 08/01/13 0406 08/02/13 0800  08/03/13 0429 08/04/13 0415  WBC 7.5 6.6 7.6 7.6 8.5 7.5  NEUTROABS  --  3.5  --   --   --   --   HGB 10.0* 8.8* 8.7* 9.2* 10.2* 9.1*  HCT 31.4* 27.5* 27.4* 29.0* 32.5* 28.9*  MCV 92.9 92.6 93.5 93.5 93.7 94.4  PLT 195 176 176 198 233 183   Cardiac Enzymes:  Recent Labs Lab 07/30/13 1931 07/30/13 2342 07/31/13 0440 07/31/13 1104  TROPONINI <0.30 <0.30 <0.30 <0.30   BNP (last 3 results)  Recent Labs  07/30/13 1931  PROBNP 3021.0*   CBG:  Recent Labs Lab 08/04/13 0548 08/04/13 1133 08/04/13 1622 08/04/13 2047 08/05/13 0630  GLUCAP 96 195* 177* 175* 119*    No results found for this or any previous visit (from the past 240 hour(s)).   Studies: No results found.  Scheduled Meds: . amLODipine  10 mg Oral Daily  . atorvastatin  80 mg Oral q1800  . calcium-vitamin D  1 tablet Oral Q breakfast  . carvedilol  25 mg Oral BID WC  . [START ON 08/07/2013] cefUROXime (ZINACEF)  IV  1.5 g Intravenous On Call to OR  . escitalopram  20 mg Oral Daily  . feeding supplement (NEPRO CARB STEADY)  237 mL Oral TID WC  . furosemide  40 mg Intravenous BID  . gabapentin  100 mg Oral Daily  . gabapentin  200 mg Oral QHS  . insulin aspart  0-9 Units Subcutaneous TID WC  . insulin glargine  13 Units Subcutaneous QHS  . lisinopril  2.5 mg Oral Daily  . sodium chloride  3 mL Intravenous Q12H  . sodium chloride  3 mL Intravenous Q12H  . tiotropium  18 mcg Inhalation Daily   Continuous Infusions: . heparin 1,300 Units/hr (08/04/13 1249)     [facility] Pager [phone] If 8PM-8AM, please contact night-coverage at [email or web address], password [facility] 08/05/2013, 7:45 AM  LOS: 6 days

## 2013-08-06 LAB — CBC
HCT: 32.8 % — ABNORMAL LOW (ref 36.0–46.0)
Hemoglobin: 10.1 g/dL — ABNORMAL LOW (ref 12.0–15.0)
MCH: 29.3 pg (ref 26.0–34.0)
MCHC: 30.8 g/dL (ref 30.0–36.0)
MCV: 95.1 fL (ref 78.0–100.0)
PLATELETS: 175 10*3/uL (ref 150–400)
RBC: 3.45 MIL/uL — ABNORMAL LOW (ref 3.87–5.11)
RDW: 15.2 % (ref 11.5–15.5)
WBC: 7.7 10*3/uL (ref 4.0–10.5)

## 2013-08-06 LAB — TYPE AND SCREEN
ABO/RH(D): A POS
Antibody Screen: NEGATIVE

## 2013-08-06 LAB — GLUCOSE, CAPILLARY
GLUCOSE-CAPILLARY: 187 mg/dL — AB (ref 70–99)
GLUCOSE-CAPILLARY: 180 mg/dL — AB (ref 70–99)
Glucose-Capillary: 104 mg/dL — ABNORMAL HIGH (ref 70–99)
Glucose-Capillary: 210 mg/dL — ABNORMAL HIGH (ref 70–99)

## 2013-08-06 LAB — SURGICAL PCR SCREEN
MRSA, PCR: NEGATIVE
STAPHYLOCOCCUS AUREUS: NEGATIVE

## 2013-08-06 LAB — URINE MICROSCOPIC-ADD ON

## 2013-08-06 LAB — URINALYSIS, ROUTINE W REFLEX MICROSCOPIC
BILIRUBIN URINE: NEGATIVE
GLUCOSE, UA: 100 mg/dL — AB
Hgb urine dipstick: NEGATIVE
KETONES UR: NEGATIVE mg/dL
Leukocytes, UA: NEGATIVE
Nitrite: NEGATIVE
Protein, ur: >300 mg/dL — AB
Specific Gravity, Urine: 1.013 (ref 1.005–1.030)
Urobilinogen, UA: 0.2 mg/dL (ref 0.0–1.0)
pH: 7 (ref 5.0–8.0)

## 2013-08-06 LAB — ABO/RH: ABO/RH(D): A POS

## 2013-08-06 LAB — HEPARIN LEVEL (UNFRACTIONATED): Heparin Unfractionated: 0.31 IU/mL (ref 0.30–0.70)

## 2013-08-06 MED ORDER — FUROSEMIDE 40 MG PO TABS
40.0000 mg | ORAL_TABLET | Freq: Every day | ORAL | Status: AC
  Administered 2013-08-06: 40 mg via ORAL
  Filled 2013-08-06: qty 1

## 2013-08-06 MED ORDER — FUROSEMIDE 40 MG PO TABS
40.0000 mg | ORAL_TABLET | Freq: Every day | ORAL | Status: DC
  Filled 2013-08-06: qty 1

## 2013-08-06 MED ORDER — HEPARIN (PORCINE) IN NACL 100-0.45 UNIT/ML-% IJ SOLN
1300.0000 [IU]/h | INTRAMUSCULAR | Status: DC
  Administered 2013-08-06 (×2): 1300 [IU]/h via INTRAVENOUS
  Filled 2013-08-06 (×3): qty 250

## 2013-08-06 NOTE — Progress Notes (Signed)
ANTICOAGULATION CONSULT NOTE - Follow-up  Pharmacy Consult for Heparin Indication: Hx DVT  Allergies  Allergen Reactions  . Codeine Nausea Only  . Morphine And Related Nausea And Vomiting    Patient Measurements: Height: 5' (152.4 cm) Weight: 195 lb (88.451 kg) (scale a) IBW/kg (Calculated) : 45.5   Vital Signs: Temp: 97.9 F (36.6 C) (05/17 0532) Temp src: Oral (05/17 0532) BP: 135/73 mmHg (05/17 0532) Pulse Rate: 50 (05/17 0532)  Labs:  Recent Labs  08/03/13 1013  08/04/13 0415 08/05/13 1009 08/06/13 0034  HGB  --   < > 9.1* 10.0* 10.1*  HCT  --   --  28.9* 31.8* 32.8*  PLT  --   --  183 191 175  HEPARINUNFRC  --   --  0.31 0.38 0.31  CREATININE 1.24*  --  1.21* 1.10  --   < > = values in this interval not displayed.  Estimated Creatinine Clearance: 45.1 ml/min (by C-G formula based on Cr of 1.1).    Assessment: 38 YOF on warfarin PTA for hx DVT reversed with Vit K on 07/23/13 during a previous admission. The patient was scheduled for a R iliac stent vs femoral-femoral bypass graft on 5/11 and Heparin was started for bridging. OR plans have been postponed due to HF exacerbation - scheduled for tomorrow 08/07/13  HL remains therapeutic 0.38. CBC wnl. No bleeding noted.    Goal of Therapy:  Heparin level 0.3-0.7 units/ml Monitor platelets by anticoagulation protocol: Yes   Plan:  Continue heparin 1300 units/ hr Daily HL, CBC  Patient on OR schedule for 5/18  [facility] B. Leitha Schuller, PharmD Clinical Pharmacist - Resident Phone: [phone] Pager: [phone] 08/06/2013 8:00 AM

## 2013-08-06 NOTE — Progress Notes (Signed)
Patient ID: Natalie Porter, female   DOB: [birth year], 73 y.o.   MRN: [id number] 2 OR tomorrow per Dr. Oneida Alar with either iliac stent or femoral-femoral bypass Patient ready for surgery in the a.m. Will DC heparin on call to the OR

## 2013-08-06 NOTE — Progress Notes (Signed)
[name] PROGRESS NOTE Interim History: 73 year old female with a past medical history of type 2 diabetes mellitus, peripheral arterial disease, hypertension, admitted to the medicine service on 07/30/2013. She was scheduled to undergo fem-fem bypass on 07/31/2013, procedure to be performed by Dr. Orland Jarred of vascular. she presented to the emergency department on 07/30/2013 with complaints of increasing shortness of breath and worsening bilateral extremity pain edema. Chest x-ray on admission revealed findings suggestive of CHF/fluid overload. Labs also revealed the presence of acute renal failure with creatinine of 2.12. Patient was started on IV Lasix 40 mg twice a day. She showed clinical improvement and by 08/01/2013 her weight had come down from 97.8 kg to 95.3 kg. With regard to peripheral arterial disease and she was started on IV heparin and has been evaluated by Dr. Orland Jarred during this hospitalization. procedure has been postponed due to acute on chronic diastolic heart failure.   Filed Weights   08/04/13 0500 08/05/13 0618 08/06/13 0532  Weight: 90.266 kg (199 lb) 88.587 kg (195 lb 4.8 oz) 88.451 kg (195 lb)        Intake/Output Summary (Last 24 hours) at 08/06/13 0744 Last data filed at 08/06/13 0647  Gross per 24 hour  Intake 1245.18 ml  Output   1601 ml  Net -355.82 ml     Assessment/Plan: Acute on chronic diastolic congestive heart failure - Recent transthoracic echocardiogram performed 07/24/2013 showing preserved ejection fraction of 60-65%, E/e'>10 suggestive of elevated filling pressures - Cont to be negative, weight down from 97->90.6-> 88 kg (dry weight 88 kg)  - Cont lasix to orals, hold tomorrow dose. - cont Betablocker, ACE-I and diuretics.  Acute on chronic renal failure/Cardiorenal syndrome:  - Baseline creatinine of around 1.3-1.6, Cr.  - Resolvedwith Diuresis. - Cr At baseline.  Peripheral Arterial Disease  -Patient had been scheduled for Fem-Fem  bypass today, however this will need to be postponed due to acute CHF  -Dr Oneida Alar of vascular surgery evaluated patient  -Continue IV heparin, plan for suregry on 5.18.2015.  Hypertension  - Blood pressures stable,  - Remains on Norvasc, low dose ACE-I and Coreg.   Hyperkalemia  -Improved with diuresis and kayexelate.  Code Status: Full Code  Family Communication: Spoke with daughters present at bedside  Disposition Plan: Continue IV diuresis   Consultants:  vascular  Procedures: ECHO: as above  Antibiotics:  None  HPI/Subjective: No complains.  Objective: Filed Vitals:   08/05/13 1954 08/05/13 2104 08/06/13 0532 08/06/13 0735  BP: 155/88  135/73   Pulse: 68  50   Temp: 98.9 F (37.2 C)  97.9 F (36.6 C)   TempSrc: Oral  Oral   Resp: 18 16 18    Height:      Weight:   88.451 kg (195 lb)   SpO2: 98%  98% 92%     Exam:  General: Alert, awake, oriented x3, in no acute distress.  HEENT: No bruits, no goiter. -JVD Heart: Regular rate and rhythm. Lungs: Good air movement, clear Abdomen: Soft, nontender, nondistended, positive bowel sounds.    Data Reviewed: Basic Metabolic Panel:  Recent Labs Lab 08/01/13 0931 08/02/13 0800 08/03/13 1013 08/04/13 0415 08/05/13 1009  NA 135* 140 141 138 142  K 5.7* 5.1 4.7 4.3 4.7  CL 103 104 103 102 103  CO2 16* 24 25 24 23   GLUCOSE 111* 107* 197* 104* 198*  BUN 51* 49* 43* 40* 34*  CREATININE 1.59* 1.60* 1.24* 1.21* 1.10  CALCIUM 8.9 8.9 9.3 9.0  9.2   Liver Function Tests:  Recent Labs Lab 07/31/13 0440  AST 24  ALT 15  ALKPHOS 37*  BILITOT <0.2*  PROT 6.3  ALBUMIN 2.4*   No results found for this basename: LIPASE, AMYLASE,  in the last 168 hours No results found for this basename: AMMONIA,  in the last 168 hours CBC:  Recent Labs Lab 07/30/13 1931 07/31/13 0440  08/02/13 0800 08/03/13 0429 08/04/13 0415 08/05/13 1009 08/06/13 0034  WBC 7.5 6.6  < > 7.6 8.5 7.5 6.9 7.7  NEUTROABS  --  3.5   --   --   --   --   --   --   HGB 10.0* 8.8*  < > 9.2* 10.2* 9.1* 10.0* 10.1*  HCT 31.4* 27.5*  < > 29.0* 32.5* 28.9* 31.8* 32.8*  MCV 92.9 92.6  < > 93.5 93.7 94.4 93.8 95.1  PLT 195 176  < > 198 233 183 191 175  < > = values in this interval not displayed. Cardiac Enzymes:  Recent Labs Lab 07/30/13 1931 07/30/13 2342 07/31/13 0440 07/31/13 1104  TROPONINI <0.30 <0.30 <0.30 <0.30   BNP (last 3 results)  Recent Labs  07/30/13 1931  PROBNP 3021.0*   CBG:  Recent Labs Lab 08/05/13 0630 08/05/13 1140 08/05/13 1658 08/05/13 2122 08/06/13 0644  GLUCAP 119* 192* 190* 178* 104*    Recent Results (from the past 240 hour(s))  SURGICAL PCR SCREEN     Status: None   Collection Time    08/05/13 11:23 PM      Result Value Ref Range Status   MRSA, PCR NEGATIVE  NEGATIVE Final   Staphylococcus aureus NEGATIVE  NEGATIVE Final   Comment:            The Xpert SA Assay (FDA     approved for NASAL specimens     in patients over 74 years of age),     is one component of     a comprehensive surveillance     program.  Test performance has     been validated by [employer] for patients greater     than or equal to 23 year old.     It is not intended     to diagnose infection nor to     guide or monitor treatment.     Studies: No results found.  Scheduled Meds: . amLODipine  10 mg Oral Daily  . atorvastatin  80 mg Oral q1800  . calcium-vitamin D  1 tablet Oral Q breakfast  . carvedilol  25 mg Oral BID WC  . [START ON 08/07/2013] cefUROXime (ZINACEF)  IV  1.5 g Intravenous On Call to OR  . escitalopram  20 mg Oral Daily  . feeding supplement (NEPRO CARB STEADY)  237 mL Oral TID WC  . furosemide  40 mg Oral Daily  . gabapentin  100 mg Oral Daily  . gabapentin  200 mg Oral QHS  . insulin aspart  0-9 Units Subcutaneous TID WC  . insulin glargine  13 Units Subcutaneous QHS  . lisinopril  2.5 mg Oral Daily  . mupirocin ointment   Nasal BID  . sodium chloride  3 mL  Intravenous Q12H  . sodium chloride  3 mL Intravenous Q12H  . tiotropium  18 mcg Inhalation Daily   Continuous Infusions: . sodium chloride    . sodium chloride    . heparin 1,300 Units/hr (08/05/13 2314)     Bess Harvest  Olevia Bowens  [name] Pager [phone] If 8PM-8AM, please contact night-coverage at [email or web address], password [email or web address] 08/06/2013, 7:44 AM  LOS: 7 days

## 2013-08-07 ENCOUNTER — Encounter (HOSPITAL_COMMUNITY): Payer: Medicare Other | Admitting: Anesthesiology

## 2013-08-07 ENCOUNTER — Inpatient Hospital Stay (HOSPITAL_COMMUNITY): Payer: Medicare Other | Admitting: Anesthesiology

## 2013-08-07 ENCOUNTER — Encounter (HOSPITAL_COMMUNITY): Payer: Self-pay | Admitting: Anesthesiology

## 2013-08-07 ENCOUNTER — Encounter (HOSPITAL_COMMUNITY)
Admission: EM | Disposition: A | Discharge status: Home / Self Care | Payer: Self-pay | Source: Home / Self Care | Attending: Internal Medicine

## 2013-08-07 HISTORY — PX: FEMORAL-FEMORAL BYPASS GRAFT: SHX936

## 2013-08-07 HISTORY — PX: APPLICATION OF WOUND VAC: SHX5189

## 2013-08-07 HISTORY — PX: ENDARTERECTOMY FEMORAL: SHX5804

## 2013-08-07 LAB — BASIC METABOLIC PANEL
BUN: 42 mg/dL — AB (ref 6–23)
CALCIUM: 8.9 mg/dL (ref 8.4–10.5)
CO2: 26 mEq/L (ref 19–32)
CREATININE: 1.22 mg/dL — AB (ref 0.50–1.10)
Chloride: 101 mEq/L (ref 96–112)
GFR calc non Af Amer: 43 mL/min — ABNORMAL LOW (ref >90)
GFR, EST AFRICAN AMERICAN: 50 mL/min — AB (ref >90)
Glucose, Bld: 139 mg/dL — ABNORMAL HIGH (ref 70–99)
POTASSIUM: 4.3 meq/L (ref 3.7–5.3)
Sodium: 139 mEq/L (ref 137–147)

## 2013-08-07 LAB — GLUCOSE, CAPILLARY
Glucose-Capillary: 150 mg/dL — ABNORMAL HIGH (ref 70–99)
Glucose-Capillary: 169 mg/dL — ABNORMAL HIGH (ref 70–99)
Glucose-Capillary: 162 mg/dL — ABNORMAL HIGH (ref 70–99)
Glucose-Capillary: 154 mg/dL — ABNORMAL HIGH (ref 70–99)

## 2013-08-07 LAB — CBC
HCT: 30 % — ABNORMAL LOW (ref 36.0–46.0)
Hemoglobin: 9.4 g/dL — ABNORMAL LOW (ref 12.0–15.0)
MCH: 29.5 pg (ref 26.0–34.0)
MCHC: 31.3 g/dL (ref 30.0–36.0)
MCV: 94 fL (ref 78.0–100.0)
Platelets: 175 10*3/uL (ref 150–400)
RBC: 3.19 MIL/uL — ABNORMAL LOW (ref 3.87–5.11)
RDW: 15.3 % (ref 11.5–15.5)
WBC: 7.5 10*3/uL (ref 4.0–10.5)

## 2013-08-07 LAB — HEPARIN LEVEL (UNFRACTIONATED): Heparin Unfractionated: 0.47 IU/mL (ref 0.30–0.70)

## 2013-08-07 SURGERY — CREATION, BYPASS, ARTERIAL, FEMORAL TO FEMORAL, USING GRAFT
Anesthesia: General | Site: Groin | Laterality: Left

## 2013-08-07 MED ORDER — POTASSIUM CHLORIDE CRYS ER 20 MEQ PO TBCR: 20.0000 meq | EXTENDED_RELEASE_TABLET | Freq: Every day | ORAL | Status: DC | PRN

## 2013-08-07 MED ORDER — NEOSTIGMINE METHYLSULFATE 10 MG/10ML IV SOLN
INTRAVENOUS | Status: AC
  Filled 2013-08-07: qty 1

## 2013-08-07 MED ORDER — THROMBIN 20000 UNITS EX SOLR
CUTANEOUS | Status: AC
  Filled 2013-08-07: qty 20000

## 2013-08-07 MED ORDER — ROCURONIUM BROMIDE 50 MG/5ML IV SOLN
INTRAVENOUS | Status: AC
  Filled 2013-08-07: qty 1

## 2013-08-07 MED ORDER — IODIXANOL 320 MG/ML IV SOLN
INTRAVENOUS | Status: DC | PRN
  Administered 2013-08-07: 10 mL via INTRA_ARTERIAL

## 2013-08-07 MED ORDER — ROCURONIUM BROMIDE 100 MG/10ML IV SOLN
INTRAVENOUS | Status: DC | PRN
  Administered 2013-08-07: 10 mg via INTRAVENOUS
  Administered 2013-08-07: 30 mg via INTRAVENOUS

## 2013-08-07 MED ORDER — DEXTROSE 5 % IV SOLN
30.0000 ug/min | INTRAVENOUS | Status: DC
  Filled 2013-08-07: qty 1

## 2013-08-07 MED ORDER — ONDANSETRON HCL 4 MG/2ML IJ SOLN
INTRAMUSCULAR | Status: DC | PRN
  Administered 2013-08-07: 4 mg via INTRAVENOUS

## 2013-08-07 MED ORDER — ONDANSETRON HCL 4 MG/2ML IJ SOLN
INTRAMUSCULAR | Status: AC
  Filled 2013-08-07: qty 2

## 2013-08-07 MED ORDER — METOPROLOL TARTRATE 1 MG/ML IV SOLN: 2.0000 mg | INTRAVENOUS | Status: DC | PRN

## 2013-08-07 MED ORDER — SODIUM CHLORIDE 0.9 % IV SOLN: INTRAVENOUS | Status: DC

## 2013-08-07 MED ORDER — PROPOFOL 10 MG/ML IV BOLUS
INTRAVENOUS | Status: AC
  Filled 2013-08-07: qty 20

## 2013-08-07 MED ORDER — OXYCODONE HCL 5 MG PO TABS
5.0000 mg | ORAL_TABLET | ORAL | Status: DC | PRN
  Administered 2013-08-07: 10 mg via ORAL
  Administered 2013-08-08: 5 mg via ORAL
  Administered 2013-08-08: 10 mg via ORAL
  Filled 2013-08-07 (×2): qty 2
  Filled 2013-08-07: qty 1

## 2013-08-07 MED ORDER — SODIUM CHLORIDE 0.9 % IV SOLN: 500.0000 mL | Freq: Once | INTRAVENOUS | Status: AC | PRN

## 2013-08-07 MED ORDER — SUCCINYLCHOLINE CHLORIDE 20 MG/ML IJ SOLN
INTRAMUSCULAR | Status: DC | PRN
  Administered 2013-08-07: 70 mg via INTRAVENOUS

## 2013-08-07 MED ORDER — PROPOFOL 10 MG/ML IV BOLUS
INTRAVENOUS | Status: DC | PRN
  Administered 2013-08-07: 100 mg via INTRAVENOUS
  Administered 2013-08-07: 50 mg via INTRAVENOUS

## 2013-08-07 MED ORDER — PROTAMINE SULFATE 10 MG/ML IV SOLN
INTRAVENOUS | Status: AC
  Filled 2013-08-07: qty 5

## 2013-08-07 MED ORDER — LACTATED RINGERS IV SOLN
INTRAVENOUS | Status: DC | PRN
  Administered 2013-08-07: 08:00:00 via INTRAVENOUS

## 2013-08-07 MED ORDER — LIDOCAINE HCL 4 % MT SOLN
OROMUCOSAL | Status: DC | PRN
  Administered 2013-08-07: 3 mL via TOPICAL

## 2013-08-07 MED ORDER — ALUM & MAG HYDROXIDE-SIMETH 200-200-20 MG/5ML PO SUSP
15.0000 mL | ORAL | Status: DC | PRN
  Administered 2013-08-08: 30 mL via ORAL
  Filled 2013-08-07: qty 30

## 2013-08-07 MED ORDER — GLYCOPYRROLATE 0.2 MG/ML IJ SOLN
INTRAMUSCULAR | Status: AC
  Filled 2013-08-07: qty 1

## 2013-08-07 MED ORDER — SUCCINYLCHOLINE CHLORIDE 20 MG/ML IJ SOLN
INTRAMUSCULAR | Status: AC
  Filled 2013-08-07: qty 1

## 2013-08-07 MED ORDER — LABETALOL HCL 5 MG/ML IV SOLN
10.0000 mg | INTRAVENOUS | Status: DC | PRN
  Filled 2013-08-07: qty 4

## 2013-08-07 MED ORDER — HEPARIN SODIUM (PORCINE) 1000 UNIT/ML IJ SOLN
INTRAMUSCULAR | Status: DC | PRN
  Administered 2013-08-07: 10000 [IU] via INTRAVENOUS

## 2013-08-07 MED ORDER — DEXTROSE 5 % IV SOLN
1.5000 g | Freq: Two times a day (BID) | INTRAVENOUS | Status: AC
  Administered 2013-08-07 – 2013-08-08 (×2): 1.5 g via INTRAVENOUS
  Filled 2013-08-07 (×2): qty 1.5

## 2013-08-07 MED ORDER — FENTANYL CITRATE 0.05 MG/ML IJ SOLN
INTRAMUSCULAR | Status: AC
  Filled 2013-08-07: qty 5

## 2013-08-07 MED ORDER — ONDANSETRON HCL 4 MG/2ML IJ SOLN: 4.0000 mg | Freq: Once | INTRAMUSCULAR | Status: DC | PRN

## 2013-08-07 MED ORDER — PHENOL 1.4 % MT LIQD: 1.0000 | OROMUCOSAL | Status: DC | PRN

## 2013-08-07 MED ORDER — GUAIFENESIN-DM 100-10 MG/5ML PO SYRP: 15.0000 mL | ORAL_SOLUTION | ORAL | Status: DC | PRN

## 2013-08-07 MED ORDER — DOCUSATE SODIUM 100 MG PO CAPS
100.0000 mg | ORAL_CAPSULE | Freq: Every day | ORAL | Status: DC
  Administered 2013-08-08 – 2013-08-10 (×3): 100 mg via ORAL
  Filled 2013-08-07 (×3): qty 1

## 2013-08-07 MED ORDER — MIDAZOLAM HCL 2 MG/2ML IJ SOLN
INTRAMUSCULAR | Status: AC
  Filled 2013-08-07: qty 2

## 2013-08-07 MED ORDER — FENTANYL CITRATE 0.05 MG/ML IJ SOLN
INTRAMUSCULAR | Status: DC | PRN
  Administered 2013-08-07: 50 ug via INTRAVENOUS
  Administered 2013-08-07: 100 ug via INTRAVENOUS
  Administered 2013-08-07 (×2): 25 ug via INTRAVENOUS

## 2013-08-07 MED ORDER — LIDOCAINE HCL (CARDIAC) 20 MG/ML IV SOLN
INTRAVENOUS | Status: DC | PRN
  Administered 2013-08-07: 100 mg via INTRAVENOUS

## 2013-08-07 MED ORDER — HYDRALAZINE HCL 20 MG/ML IJ SOLN: 10.0000 mg | INTRAMUSCULAR | Status: DC | PRN

## 2013-08-07 MED ORDER — DOPAMINE-DEXTROSE 3.2-5 MG/ML-% IV SOLN: 3.0000 ug/kg/min | INTRAVENOUS | Status: DC

## 2013-08-07 MED ORDER — GLYCOPYRROLATE 0.2 MG/ML IJ SOLN
INTRAMUSCULAR | Status: AC
  Filled 2013-08-07: qty 2

## 2013-08-07 MED ORDER — PANTOPRAZOLE SODIUM 40 MG PO TBEC
40.0000 mg | DELAYED_RELEASE_TABLET | Freq: Every day | ORAL | Status: DC
  Administered 2013-08-07 – 2013-08-10 (×4): 40 mg via ORAL
  Filled 2013-08-07 (×2): qty 1

## 2013-08-07 MED ORDER — NEOSTIGMINE METHYLSULFATE 10 MG/10ML IV SOLN
INTRAVENOUS | Status: DC | PRN
  Administered 2013-08-07: 4 mg via INTRAVENOUS

## 2013-08-07 MED ORDER — 0.9 % SODIUM CHLORIDE (POUR BTL) OPTIME
TOPICAL | Status: DC | PRN
  Administered 2013-08-07: 2000 mL

## 2013-08-07 MED ORDER — LIDOCAINE HCL (CARDIAC) 20 MG/ML IV SOLN
INTRAVENOUS | Status: AC
  Filled 2013-08-07: qty 5

## 2013-08-07 MED ORDER — GLYCOPYRROLATE 0.2 MG/ML IJ SOLN
INTRAMUSCULAR | Status: DC | PRN
  Administered 2013-08-07: 0.2 mg via INTRAVENOUS
  Administered 2013-08-07: .6 mg via INTRAVENOUS

## 2013-08-07 MED ORDER — DEXTROSE 5 % IV SOLN
1.5000 g | INTRAVENOUS | Status: AC
  Filled 2013-08-07: qty 1.5

## 2013-08-07 MED ORDER — SODIUM CHLORIDE 0.9 % IR SOLN
Status: DC | PRN
  Administered 2013-08-07: 10:00:00

## 2013-08-07 MED ORDER — MORPHINE SULFATE 2 MG/ML IJ SOLN
2.0000 mg | INTRAMUSCULAR | Status: DC | PRN
  Administered 2013-08-08: 2 mg via INTRAVENOUS
  Filled 2013-08-07: qty 1

## 2013-08-07 MED ORDER — HYDROMORPHONE HCL PF 1 MG/ML IJ SOLN: 0.2500 mg | INTRAMUSCULAR | Status: DC | PRN

## 2013-08-07 MED ORDER — PROTAMINE SULFATE 10 MG/ML IV SOLN
INTRAVENOUS | Status: DC | PRN
  Administered 2013-08-07: 90 mg via INTRAVENOUS
  Administered 2013-08-07: 10 mg via INTRAVENOUS

## 2013-08-07 MED ORDER — CEFUROXIME SODIUM 1.5 G IJ SOLR
1.5000 g | INTRAMUSCULAR | Status: DC | PRN
  Administered 2013-08-07: 1.5 g via INTRAVENOUS

## 2013-08-07 MED ORDER — HEPARIN SODIUM (PORCINE) 1000 UNIT/ML IJ SOLN
INTRAMUSCULAR | Status: AC
  Filled 2013-08-07: qty 1

## 2013-08-07 MED ORDER — ENOXAPARIN SODIUM 30 MG/0.3ML ~~LOC~~ SOLN
30.0000 mg | SUBCUTANEOUS | Status: DC
  Administered 2013-08-08 – 2013-08-09 (×2): 30 mg via SUBCUTANEOUS
  Filled 2013-08-07 (×3): qty 0.3

## 2013-08-07 MED ORDER — LIDOCAINE HCL (CARDIAC) 20 MG/ML IV SOLN
INTRAVENOUS | Status: AC
  Filled 2013-08-07: qty 10

## 2013-08-07 SURGICAL SUPPLY — 58 items
BAG BANDED W/RUBBER/TAPE 36X54 (MISCELLANEOUS) ×4 IMPLANT
BAG ISOLATION DRAPE 18X18 (DRAPES) ×3 IMPLANT
BLADE 10 SAFETY STRL DISP (BLADE) ×4 IMPLANT
CANISTER SUCTION 2500CC (MISCELLANEOUS) ×4 IMPLANT
CANISTER WOUND CARE 500ML ATS (WOUND CARE) ×4 IMPLANT
CATH BEACON 5.038 65CM KMP-01 (CATHETERS) ×4 IMPLANT
CATH QUICKCROSS SUPP .035X90CM (MICROCATHETER) ×4 IMPLANT
CLIP TI MEDIUM 24 (CLIP) ×4 IMPLANT
CLIP TI WIDE RED SMALL 24 (CLIP) ×4 IMPLANT
CONNECTOR Y ATS VAC SYSTEM (MISCELLANEOUS) ×4 IMPLANT
COVER DOME SNAP 22 D (MISCELLANEOUS) ×4 IMPLANT
COVER SURGICAL LIGHT HANDLE (MISCELLANEOUS) ×4 IMPLANT
COVER TRANSDUCER ULTRASND GEL (DRAPE) ×4 IMPLANT
DERMABOND ADVANCED (GAUZE/BANDAGES/DRESSINGS) ×1
DERMABOND ADVANCED .7 DNX12 (GAUZE/BANDAGES/DRESSINGS) ×3 IMPLANT
DRAIN SNY WOU (WOUND CARE) IMPLANT
DRAPE ISOLATION BAG 18X18 (DRAPES) ×1
DRAPE WARM FLUID 44X44 (DRAPE) ×4 IMPLANT
DRSG VAC ATS MED SENSATRAC (GAUZE/BANDAGES/DRESSINGS) ×8 IMPLANT
ELECT REM PT RETURN 9FT ADLT (ELECTROSURGICAL) ×4
ELECTRODE REM PT RTRN 9FT ADLT (ELECTROSURGICAL) ×3 IMPLANT
EVACUATOR SILICONE 100CC (DRAIN) IMPLANT
GLIDEWIRE STIFF .35X180X3 HYDR (WIRE) ×4 IMPLANT
GLOVE BIO SURGEON STRL SZ 6.5 (GLOVE) ×8 IMPLANT
GLOVE BIO SURGEON STRL SZ7.5 (GLOVE) ×4 IMPLANT
GLOVE BIOGEL M 7.0 STRL (GLOVE) ×4 IMPLANT
GLOVE BIOGEL PI IND STRL 7.5 (GLOVE) ×3 IMPLANT
GLOVE BIOGEL PI INDICATOR 7.5 (GLOVE) ×1
GOWN STRL REUS W/ TWL LRG LVL3 (GOWN DISPOSABLE) ×15 IMPLANT
GOWN STRL REUS W/TWL LRG LVL3 (GOWN DISPOSABLE) ×5
GRAFT HEMASHIELD 8MM (Vascular Products) ×1 IMPLANT
GRAFT VASC STRG 30X8KNIT (Vascular Products) ×3 IMPLANT
GUIDEWIRE WHOLEY .035 145 JTIP (WIRE) ×4 IMPLANT
KIT BASIN OR (CUSTOM PROCEDURE TRAY) ×4 IMPLANT
KIT ROOM TURNOVER OR (KITS) ×4 IMPLANT
NEEDLE PERC 18GX7CM (NEEDLE) ×4 IMPLANT
NS IRRIG 1000ML POUR BTL (IV SOLUTION) ×8 IMPLANT
PACK PERIPHERAL VASCULAR (CUSTOM PROCEDURE TRAY) ×4 IMPLANT
PAD ARMBOARD 7.5X6 YLW CONV (MISCELLANEOUS) ×8 IMPLANT
PAD NEG PRESSURE SENSATRAC (MISCELLANEOUS) ×4 IMPLANT
SHEATH AVANTI 11CM 5FR (MISCELLANEOUS) ×4 IMPLANT
SPONGE SURGIFOAM ABS GEL 100 (HEMOSTASIS) IMPLANT
STAPLER VISISTAT 35W (STAPLE) IMPLANT
SUT PROLENE 5 0 C 1 24 (SUTURE) ×12 IMPLANT
SUT PROLENE 6 0 CC (SUTURE) ×16 IMPLANT
SUT SILK 3 0 (SUTURE)
SUT SILK 3-0 18XBRD TIE 12 (SUTURE) IMPLANT
SUT VIC AB 2-0 CT1 27 (SUTURE) ×2
SUT VIC AB 2-0 CT1 TAPERPNT 27 (SUTURE) ×6 IMPLANT
SUT VIC AB 3-0 SH 27 (SUTURE) ×2
SUT VIC AB 3-0 SH 27X BRD (SUTURE) ×6 IMPLANT
SUT VICRYL 4-0 PS2 18IN ABS (SUTURE) ×8 IMPLANT
TAPE UMBILICAL COTTON 1/8X30 (MISCELLANEOUS) ×4 IMPLANT
TOWEL OR 17X24 6PK STRL BLUE (TOWEL DISPOSABLE) ×8 IMPLANT
TOWEL OR 17X26 10 PK STRL BLUE (TOWEL DISPOSABLE) ×4 IMPLANT
TRAY FOLEY CATH 16FRSI W/METER (SET/KITS/TRAYS/PACK) ×4 IMPLANT
UNDERPAD 30X30 INCONTINENT (UNDERPADS AND DIAPERS) ×4 IMPLANT
WATER STERILE IRR 1000ML POUR (IV SOLUTION) ×4 IMPLANT

## 2013-08-07 NOTE — Progress Notes (Signed)
Placed pt on CPAP at [facility] per home settings with 1L of oxygen bled in. Sterile water was added for humidification. Pt looks comfortable and is tolerating CPAP well at this time. RT will continue to monitor as needed.

## 2013-08-07 NOTE — Op Note (Signed)
Procedure: Right to left femoral-femoral bypass, left femoral endarterectomy, attempted left iliac stent, placement of incisional VAC  Preoperative diagnosis: Non healing wound left foot  Postoperative diagnosis: Same  Anesthesia: Gen.  Assistant: Adele Barthel MD, Leontine Locket PA-C  Upper findings: 8 mm Dacron graft  Indications: Patient is a 73 year old female who recently had right common iliac artery stenting. An attempt was made to recanalize the left iliac but was unsuccessful.  She is brought to the OR today for an additional attempt at recanalization of her left iliac artery and a plan to do a femoral femoral bypass if stenting is not successful.   Operative details: After obtaining informed consent, the patient was taken to the operating room. The patient was placed in supine position on the operating room table. After induction of general anesthesia, a Foley catheter was placed. Next the patient was prepped and draped in the usual sterile fashion from the umbilicus to the knees. An oblique incision was made in the left groin through. The incision was taken through the subcutaneous tissues down to level of the left common femoral artery.  The profunda femoris and superficial femoral artery dissected free circumferentially. These were fairly small. They were soft. The native common femoral artery was also dissected free circumferentially; this had some posterior plaque. Vessel loops were placed around all these. An introducer needle was placed in the left common femoral artery. An 32 Bentson wire was threaded up into the left iliac system up near the aortic bifurcation. A 5 French sheath was placed over the guidewire the left common femoral artery. This was thoroughly flushed with heparinized saline. A quick cross catheter was then placed over the guidewire up to the level of the left iliac occlusion. The [facility] wire was exchanged for an 035 stiff angled Glidewire. Several attempts were made to  reflux the wire on itself which were successful. I advanced the quick cross catheter over the Glidewire. However I was not able to find a reentry point. The quick cross catheter was exchanged for a 5 [place] KMP catheter. I used this to try to redirect the Hurley. However, after several more attempts I was still unable to obtain a reentry plane. Contrast angiogram was performed on 2 occasions to assist with finding a reentry point. However despite several attempts to recanalize in the iliac artery I was never able to reenter the lumen. At this point it was decided to abandon attempts to stent the left common iliac and proceed with a right to left femoral-femoral bypass.   Attention was then turned to the right groin. In similar fashion an oblique incision was made in the right groin. The patient had a slightly higher femoral bifurcation on the right side. This required a fair amount of dissection underneath the inguinal ligament.The right common femoral  was dissected free circumferentially. The profunda femoris and superficial femoral arteries were dissected free circumferentially. Vessel loops were placed around all these. There is a good pulse within the right common femoral artery.  Next a subcutaneous tunnel was created over the suprapubic region connecting the left and right groin incisions. An 8 mm Dacron graft was brought through this. The patient was given 10000 units of intravenous heparin.  Next the right common femoral artery was controlled with a Henley clamp. The superficial femoral and profunda femoris arteries were controlled with vessel loop.  The 8 mm Dacron graft was spatulated and sewn end of graft to side of right common femoral artery with a running 6-0  Prolene suture.. Just prior to completion of the anastomosis, it was forebled backbled and thoroughly flushed. The anastomosis was secured and clamps released and there was good pulsatile flow in the graft immediately. Hemostasis was  obtained. Attention was then turned to the left groin. The native common femoral artery was controlled proximally with a Henley clamp. A longitudinal opening was made in the left common femoral artery with an 11 blade. There was a large exophytic plaque in the posterior wall so a common femoral endarterectomy was performed with a good endpoint. There was good backbleeding from the profunda and the superficial femoral arteries. These were controlled with vessel loop.  The 8 mm Dacron graft was cut to length and beveled. This was then sewn end of graft to side of native common femoral artery using a running 6-0 Prolene suture. Just prior to completion of the anastomosis it was forebled backbled and thoroughly flushed. the anastomosis was secured; clamps released; and there was pulsatile flow in the graft immediately. The patient had good Doppler signals in the feet bilaterally.  There was also good pulsatile flow in the distal superficial femoral artery at the level of the groin. One repair stitch was placed in the left groin for hemostasis. The patient was given 100 mg of protamine for heparin reversal. Hemostasis was obtained.  The groins were then closed in multiple layers using running 2 0 and 3 0 Vicryl suture. The skin of both incisions was closed with a 4 0 Vicryl subcuticular stitch. An incisional back was placed over each groin and connected at 125 mm mercury suction. The patient tolerated procedure well and there were no complications. The patient was extubated in the operating room and taken to recovery in stable condition.  Ruta Hinds, MD [facility] [place] Office: [phone] Pager: [phone]

## 2013-08-07 NOTE — Anesthesia Postprocedure Evaluation (Signed)
  Anesthesia Post-op Note  Patient: Natalie Porter  Procedure(s) Performed: Procedure(s): BYPASS GRAFT FEMORAL-FEMORAL ARTERY USING 8MM X 30CM HEMASHIELD GRAFT; LEFT ILIAC ANGIOGRAM; ATTEMPTED LEFT ILIAC ARTERY STENT GRAFT APPLICATION OF INCISIONAL WOUND VAC (Bilateral) ENDARTERECTOMY FEMORAL (Left)  Patient Location: PACU  Anesthesia Type:General  Level of Consciousness: awake, alert , oriented and patient cooperative  Airway and Oxygen Therapy: Patient Spontanous Breathing  Post-op Pain: mild  Post-op Assessment: Post-op Vital signs reviewed, Patient's Cardiovascular Status Stable, Respiratory Function Stable, Patent Airway and No signs of Nausea or vomiting  Post-op Vital Signs: stable  Last Vitals:  Filed Vitals:   08/07/13 1325  BP:   Pulse:   Temp: 36.7 C  Resp:     Complications: No apparent anesthesia complications

## 2013-08-07 NOTE — H&P (View-Only) (Signed)
Patient ID: Natalie Porter, female   DOB: 12-16-1940, 73 y.o.   MRN: [id number] 2 OR tomorrow per Dr. Oneida Alar with either iliac stent or femoral-femoral bypass Patient ready for surgery in the a.m. Will DC heparin on call to the OR

## 2013-08-07 NOTE — Progress Notes (Signed)
Dr. Oneida Alar at [facility] aware of bradycardia (HR)-- no new orders. Pt came in with HR in the 50s and CRNA states they held beta blocker this am. Pt asymptomatic. Will cont to monitor.

## 2013-08-07 NOTE — Interval H&P Note (Signed)
History and Physical Interval Note:  08/07/2013 7:32 AM  Natalie Porter  has presented today for surgery, with the diagnosis of Chronic total occlusion of artery of the extremities  The various methods of treatment have been discussed with the patient and family. After consideration of risks, benefits and other options for treatment, the patient has consented to  Procedure(s): BYPASS GRAFT FEMORAL-FEMORAL ARTERY; POSSIBLE LEFT ILIAC ARTERY STENT (Bilateral) as a surgical intervention .  The patient's history has been reviewed, patient examined, no change in status, stable for surgery.  I have reviewed the patient's chart and labs.  Questions were answered to the patient's satisfaction.     Elam Dutch

## 2013-08-07 NOTE — Progress Notes (Signed)
[name] PROGRESS NOTE Interim History: 73 year old female with a past medical history of type 2 diabetes mellitus, peripheral arterial disease, hypertension, admitted to the medicine service on 07/30/2013. She was scheduled to undergo fem-fem bypass on 07/31/2013, procedure to be performed by Dr. Orland Jarred of vascular. she presented to the emergency department on 07/30/2013 with complaints of increasing shortness of breath and worsening bilateral extremity pain edema. Chest x-ray on admission revealed findings suggestive of CHF/fluid overload. Labs also revealed the presence of acute renal failure with creatinine of 2.12. Patient was started on IV Lasix 40 mg twice a day. She showed clinical improvement and by 08/01/2013 her weight had come down from 97.8 kg to 95.3 kg. With regard to peripheral arterial disease and she was started on IV heparin and has been evaluated by Dr. Orland Jarred during this hospitalization. procedure has been postponed due to acute on chronic diastolic heart failure.   Filed Weights   08/05/13 0618 08/06/13 0532 08/07/13 0547  Weight: 88.587 kg (195 lb 4.8 oz) 88.451 kg (195 lb) 88.9 kg (195 lb 15.8 oz)        Intake/Output Summary (Last 24 hours) at 08/07/13 0732 Last data filed at 08/06/13 2141  Gross per 24 hour  Intake    940 ml  Output    800 ml  Net    140 ml     Assessment/Plan: Acute on chronic diastolic congestive heart failure - Recent transthoracic echocardiogram performed 07/24/2013 showing preserved ejection fraction of 60-65%, E/e'>10 suggestive of elevated filling pressures - Cont to be negative, weight down from 97->90.6-> 88 kg (dry weight 88 kg)  - hold orals lasix, b-met in am. - cont Betablocker, ACE-I and diuretics.  Acute on chronic renal failure/Cardiorenal syndrome:  - Baseline creatinine of around 1.3-1.6, Cr.  - Resolvedwith Diuresis. - Cr At baseline.  Peripheral Arterial Disease  -Patient had been scheduled for Fem-Fem bypass  today, however this will need to be postponed due to acute CHF  -Continue IV heparin, plan for suregry on 5.18.2015.  Hypertension  - Blood pressures stable,  - Remains on Norvasc, low dose ACE-I and Coreg.   Hyperkalemia  -Improved with diuresis and kayexelate.  Code Status: Full Code  Family Communication: Spoke with daughters present at bedside  Disposition Plan: Continue IV diuresis   Consultants:  vascular  Procedures: ECHO: as above  Antibiotics:  None  HPI/Subjective: No complains.  Objective: Filed Vitals:   08/06/13 1027 08/06/13 1400 08/06/13 2128 08/07/13 0547  BP: 149/38 153/45 172/43 143/39  Pulse:  55 64 65  Temp:  98.1 F (36.7 C) 97.8 F (36.6 C) 98.3 F (36.8 C)  TempSrc:  Oral Oral Oral  Resp:  _0 Height:      Weight:    88.9 kg (195 lb 15.8 oz)  SpO2:  97% 95% 95%     Exam:  General: Alert, awake, oriented x3, in no acute distress.  HEENT: No bruits, no goiter. -JVD Heart: Regular rate and rhythm. Lungs: Good air movement, clear Abdomen: Soft, nontender, nondistended, positive bowel sounds.    Data Reviewed: Basic Metabolic Panel:  Recent Labs Lab 08/02/13 0800 08/03/13 1013 08/04/13 0415 08/05/13 1009 08/07/13 0419  NA 140 141 138 142 139  K 5.1 4.7 4.3 4.7 4.3  CL 104 103 102 103 101  CO2 _1 GLUCOSE 107* 197* 104* 198* 139*  BUN 49* 43* 40* 34* 42*  CREATININE 1.60* 1.24* 1.21* 1.10 1.22*  CALCIUM 8.9 9.3 9.0 9.2 8.9   Liver Function Tests: No results found for this basename: AST, ALT, ALKPHOS, BILITOT, PROT, ALBUMIN,  in the last 168 hours No results found for this basename: LIPASE, AMYLASE,  in the last 168 hours No results found for this basename: AMMONIA,  in the last 168 hours CBC:  Recent Labs Lab 08/03/13 0429 08/04/13 0415 08/05/13 1009 08/06/13 0034 08/07/13 0419  WBC 8.5 7.5 6.9 7.7 7.5  HGB 10.2* 9.1* 10.0* 10.1* 9.4*  HCT 32.5* 28.9* 31.8* 32.8* 30.0*  MCV 93.7 94.4 93.8 95.1  94.0  PLT 233 183 191 175 175   Cardiac Enzymes:  Recent Labs Lab 07/31/13 1104  TROPONINI <0.30   BNP (last 3 results)  Recent Labs  07/30/13 1931  PROBNP 3021.0*   CBG:  Recent Labs Lab 08/05/13 2122 08/06/13 0644 08/06/13 1152 08/06/13 1639 08/06/13 2134  GLUCAP 178* 104* 187* 180* 210*    Recent Results (from the past 240 hour(s))  SURGICAL PCR SCREEN     Status: None   Collection Time    08/05/13 11:23 PM      Result Value Ref Range Status   MRSA, PCR NEGATIVE  NEGATIVE Final   Staphylococcus aureus NEGATIVE  NEGATIVE Final   Comment:            The Xpert SA Assay (FDA     approved for NASAL specimens     in patients over 10 years of age),     is one component of     a comprehensive surveillance     program.  Test performance has     been validated by [employer] for patients greater     than or equal to 28 year old.     It is not intended     to diagnose infection nor to     guide or monitor treatment.     Studies: No results found.  Scheduled Meds: . amLODipine  10 mg Oral Daily  . atorvastatin  80 mg Oral q1800  . calcium-vitamin D  1 tablet Oral Q breakfast  . carvedilol  25 mg Oral BID WC  . cefUROXime (ZINACEF)  IV  1.5 g Intravenous On Call to OR  . escitalopram  20 mg Oral Daily  . feeding supplement (NEPRO CARB STEADY)  237 mL Oral TID WC  . [START ON 08/08/2013] furosemide  40 mg Oral Daily  . gabapentin  100 mg Oral Daily  . gabapentin  200 mg Oral QHS  . insulin aspart  0-9 Units Subcutaneous TID WC  . insulin glargine  13 Units Subcutaneous QHS  . lisinopril  2.5 mg Oral Daily  . sodium chloride  3 mL Intravenous Q12H  . sodium chloride  3 mL Intravenous Q12H  . tiotropium  18 mcg Inhalation Daily   Continuous Infusions: . sodium chloride    . sodium chloride    . heparin 1,300 Units/hr (08/06/13 2220)     [facility] Pager [phone] If 8PM-8AM, please contact night-coverage at  [email or web address], password [facility] 08/07/2013, 7:32 AM  LOS: 8 days

## 2013-08-07 NOTE — Anesthesia Procedure Notes (Signed)
Procedure Name: Intubation Date/Time: 08/07/2013 9:02 AM Performed by: Izora Gala Pre-anesthesia Checklist: Patient identified, Emergency Drugs available, Suction available and Patient being monitored Patient Re-evaluated:Patient Re-evaluated prior to inductionOxygen Delivery Method: Circle system utilized Preoxygenation: Pre-oxygenation with 100% oxygen Intubation Type: IV induction Ventilation: Mask ventilation without difficulty and Oral airway inserted - appropriate to patient size Laryngoscope Size: Miller and 3 Grade View: Grade I Tube type: Oral Tube size: 7.5 mm Number of attempts: 1 Airway Equipment and Method: Stylet and LTA kit utilized Placement Confirmation: ETT inserted through vocal cords under direct vision,  positive ETCO2 and breath sounds checked- equal and bilateral Secured at: 21.5 cm Tube secured with: Tape Dental Injury: Teeth and Oropharynx as per pre-operative assessment

## 2013-08-07 NOTE — Progress Notes (Signed)
PT Cancellation Note  Patient Details Name: Natalie Porter MRN: [id number] DOB: 09-01-1940   Cancelled Treatment:    Reason Eval/Treat Not Completed: Patient at procedure or test/unavailable. Pt to have a right to left femoral-femoral bypass today. Will continue to follow.    Jolyn Lent 08/07/2013, 1:13 PM  Jolyn Lent, PT, DPT Acute Rehabilitation Services Pager: [phone]

## 2013-08-07 NOTE — Transfer of Care (Signed)
Immediate Anesthesia Transfer of Care Note  Patient: Natalie Porter  Procedure(s) Performed: Procedure(s): BYPASS GRAFT FEMORAL-FEMORAL ARTERY USING 8MM X 30CM HEMASHIELD GRAFT; LEFT ILIAC ANGIOGRAM; ATTEMPTED LEFT ILIAC ARTERY STENT GRAFT APPLICATION OF INCISIONAL WOUND VAC (Bilateral) ENDARTERECTOMY FEMORAL (Left)  Patient Location: PACU  Anesthesia Type:General  Level of Consciousness: awake, alert , oriented and patient cooperative  Airway & Oxygen Therapy: Patient Spontanous Breathing and Patient connected to nasal cannula oxygen  Post-op Assessment: Report given to PACU RN, Post -op Vital signs reviewed and stable and Patient moving all extremities  Post vital signs: Reviewed and stable  Complications: No apparent anesthesia complications

## 2013-08-07 NOTE — Anesthesia Preprocedure Evaluation (Signed)
Anesthesia Evaluation  Patient identified by MRN, date of birth, ID band Patient awake    Reviewed: Allergy & Precautions, H&P , NPO status , Patient's Chart, lab work & pertinent test results  Airway       Dental   Pulmonary COPDformer smoker,          Cardiovascular hypertension, + Peripheral Vascular Disease and +CHF     Neuro/Psych    GI/Hepatic GERD-  ,  Endo/Other  diabetes, Type 2, Insulin Dependent, Oral Hypoglycemic Agents  Renal/GU ARFRenal disease     Musculoskeletal   Abdominal   Peds  Hematology  (+) anemia ,   Anesthesia Other Findings   Reproductive/Obstetrics                           Anesthesia Physical Anesthesia Plan  ASA: III  Anesthesia Plan: General   Post-op Pain Management:    Induction: Intravenous  Airway Management Planned: Oral ETT  Additional Equipment:   Intra-op Plan:   Post-operative Plan: Extubation in OR and Possible Post-op intubation/ventilation  Informed Consent: I have reviewed the patients History and Physical, chart, labs and discussed the procedure including the risks, benefits and alternatives for the proposed anesthesia with the patient or authorized representative who has indicated his/her understanding and acceptance.     Plan Discussed with:   Anesthesia Plan Comments:         Anesthesia Quick Evaluation

## 2013-08-07 NOTE — Progress Notes (Signed)
Report given to Caren Griffins, RN in surgical short stay

## 2013-08-08 ENCOUNTER — Inpatient Hospital Stay (HOSPITAL_COMMUNITY): Payer: Medicare Other

## 2013-08-08 ENCOUNTER — Encounter (HOSPITAL_COMMUNITY): Payer: Self-pay | Admitting: Vascular Surgery

## 2013-08-08 LAB — GLUCOSE, CAPILLARY
GLUCOSE-CAPILLARY: 139 mg/dL — AB (ref 70–99)
GLUCOSE-CAPILLARY: 215 mg/dL — AB (ref 70–99)
Glucose-Capillary: 234 mg/dL — ABNORMAL HIGH (ref 70–99)
Glucose-Capillary: 228 mg/dL — ABNORMAL HIGH (ref 70–99)

## 2013-08-08 LAB — BASIC METABOLIC PANEL
BUN: 39 mg/dL — AB (ref 6–23)
CO2: 27 mEq/L (ref 19–32)
Calcium: 8.5 mg/dL (ref 8.4–10.5)
Chloride: 104 mEq/L (ref 96–112)
Creatinine, Ser: 1.52 mg/dL — ABNORMAL HIGH (ref 0.50–1.10)
GFR calc non Af Amer: 33 mL/min — ABNORMAL LOW (ref >90)
GFR, EST AFRICAN AMERICAN: 38 mL/min — AB (ref >90)
GLUCOSE: 120 mg/dL — AB (ref 70–99)
POTASSIUM: 5.4 meq/L — AB (ref 3.7–5.3)
Sodium: 138 mEq/L (ref 137–147)

## 2013-08-08 LAB — CBC
HCT: 28.9 % — ABNORMAL LOW (ref 36.0–46.0)
HEMOGLOBIN: 9 g/dL — AB (ref 12.0–15.0)
MCH: 29.5 pg (ref 26.0–34.0)
MCHC: 31.1 g/dL (ref 30.0–36.0)
MCV: 94.8 fL (ref 78.0–100.0)
Platelets: 146 10*3/uL — ABNORMAL LOW (ref 150–400)
RBC: 3.05 MIL/uL — ABNORMAL LOW (ref 3.87–5.11)
RDW: 15.6 % — ABNORMAL HIGH (ref 11.5–15.5)
WBC: 10.4 10*3/uL (ref 4.0–10.5)

## 2013-08-08 MED ORDER — SODIUM POLYSTYRENE SULFONATE 15 GM/60ML PO SUSP
30.0000 g | Freq: Once | ORAL | Status: AC
  Administered 2013-08-08: 30 g via ORAL
  Filled 2013-08-08: qty 120

## 2013-08-08 MED ORDER — SIMETHICONE 80 MG PO CHEW
160.0000 mg | CHEWABLE_TABLET | Freq: Four times a day (QID) | ORAL | Status: DC | PRN
  Filled 2013-08-08 (×2): qty 2

## 2013-08-08 NOTE — Progress Notes (Signed)
Utilization review completed.  

## 2013-08-08 NOTE — Progress Notes (Signed)
Pt tx [facility] Per MD order, pt VSS, pt and family verbalized understanding of tx, pt's teeth in mouth and daughter had glasses, pt's daughter checked room for possible missed items, report called to receiving RN, all questions answered

## 2013-08-08 NOTE — Progress Notes (Signed)
[name] PROGRESS NOTE Interim History: 73 year old female with a past medical history of type 2 diabetes mellitus, peripheral arterial disease, hypertension, admitted to the medicine service on 07/30/2013. She was scheduled to undergo fem-fem bypass on 07/31/2013, procedure to be performed by Dr. Orland Jarred of vascular. she presented to the emergency department on 07/30/2013 with complaints of increasing shortness of breath and worsening bilateral extremity pain edema. Chest x-ray on admission revealed findings suggestive of CHF/fluid overload. Labs also revealed the presence of acute renal failure with creatinine of 2.12. Patient was started on IV Lasix 40 mg twice a day. She showed clinical improvement and by 08/01/2013 her weight had come down from 97.8 kg to 95.3 kg. With regard to peripheral arterial disease and she was started on IV heparin and has been evaluated by Dr. Orland Jarred during this hospitalization. procedure has been postponed due to acute on chronic diastolic heart failure.   Filed Weights   08/06/13 0532 08/07/13 0547 08/07/13 1439  Weight: 88.451 kg (195 lb) 88.9 kg (195 lb 15.8 oz) 90.7 kg (199 lb 15.3 oz)        Intake/Output Summary (Last 24 hours) at 08/08/13 0803 Last data filed at 08/08/13 0315  Gross per 24 hour  Intake   1330 ml  Output   1600 ml  Net   -270 ml     Assessment/Plan: Acute on chronic diastolic congestive heart failure - Recent transthoracic echocardiogram performed 07/24/2013 showing preserved ejection fraction of 60-65%, E/e'>10 suggestive of elevated filling pressures - Cont to be negative, weight down from 97->90.6-> 88 kg (dry weight 88 kg)  - hold orals lasix, b-met showed cr at baseline. - cont. To monitor hold IV fluids liberalized diet.  Acute on chronic renal failure/Cardiorenal syndrome:  - Baseline creatinine of around 1.3-1.6, Cr.  - Resolved with Diuresis. - Hold IV fluids as her cr seems to be at baseline. Monitor cr for 48  hrs.  Peripheral Arterial Disease  -Patient S/p  Fem-Fem bypass 5.18.2015.  Fever: - 101.4 overnight: No cough, SOB or CP. - Given 1 dose of cefuroxime on 5.18.2015. - check blood cultures and CXR rule out bacteremia and/or Asp PNA. - her WBC is < 11.0  Hypertension  - Blood pressures stable,  - Remains on Norvasc, low dose ACE-I and Coreg.   Hyperkalemia  - Improved with diuresis and kayexelate. - Was getting supplementation prior to surgery will hold. - Getting narcotics, will give kayexalate. - Repeat in the am.   Code Status: Full Code  Family Communication: Spoke with daughters present at bedside  Disposition Plan: Continue IV diuresis   Consultants:  vascular  Procedures: ECHO: as above  Antibiotics:  None  HPI/Subjective: No complains.  Objective: Filed Vitals:   08/07/13 1958 08/07/13 2242 08/08/13 0106 08/08/13 0315  BP: 149/89 149/89 146/32 143/53  Pulse: 58 60 57 57  Temp: 98.1 F (36.7 C)  101.4 F (38.6 C) 100.2 F (37.9 C)  TempSrc: Oral  Oral Oral  Resp: '13 19 15 19  ' Height:      Weight:      SpO2: 96% 96% 96% 97%     Exam:  General: Alert, awake, oriented x3, in no acute distress.  HEENT: No bruits, no goiter. -JVD Heart: Regular rate and rhythm. Lungs: Good air movement, clear Abdomen: Soft, nontender, nondistended, positive bowel sounds.    Data Reviewed: Basic Metabolic Panel:  Recent Labs Lab 08/03/13 1013 08/04/13 0415 08/05/13 1009 08/07/13 0419 08/08/13 0335  NA 141  138 142 139 138  K 4.7 4.3 4.7 4.3 5.4*  CL 103 102 103 101 104  CO2 '25 24 23 26 27  ' GLUCOSE 197* 104* 198* 139* 120*  BUN 43* 40* 34* 42* 39*  CREATININE 1.24* 1.21* 1.10 1.22* 1.52*  CALCIUM 9.3 9.0 9.2 8.9 8.5   Liver Function Tests: No results found for this basename: AST, ALT, ALKPHOS, BILITOT, PROT, ALBUMIN,  in the last 168 hours No results found for this basename: LIPASE, AMYLASE,  in the last 168 hours No results found for this  basename: AMMONIA,  in the last 168 hours CBC:  Recent Labs Lab 08/04/13 0415 08/05/13 1009 08/06/13 0034 08/07/13 0419 08/08/13 0335  WBC 7.5 6.9 7.7 7.5 10.4  HGB 9.1* 10.0* 10.1* 9.4* 9.0*  HCT 28.9* 31.8* 32.8* 30.0* 28.9*  MCV 94.4 93.8 95.1 94.0 94.8  PLT 183 191 175 175 146*   Cardiac Enzymes: No results found for this basename: CKTOTAL, CKMB, CKMBINDEX, TROPONINI,  in the last 168 hours BNP (last 3 results)  Recent Labs  07/30/13 1931  PROBNP 3021.0*   CBG:  Recent Labs Lab 08/06/13 2134 08/07/13 0553 08/07/13 1233 08/07/13 1714 08/07/13 2136  GLUCAP 210* 150* 169* 162* 154*    Recent Results (from the past 240 hour(s))  SURGICAL PCR SCREEN     Status: None   Collection Time    08/05/13 11:23 PM      Result Value Ref Range Status   MRSA, PCR NEGATIVE  NEGATIVE Final   Staphylococcus aureus NEGATIVE  NEGATIVE Final   Comment:            The Xpert SA Assay (FDA     approved for NASAL specimens     in patients over 49 years of age),     is one component of     a comprehensive surveillance     program.  Test performance has     been validated by [employer] for patients greater     than or equal to 37 year old.     It is not intended     to diagnose infection nor to     guide or monitor treatment.     Studies: No results found.  Scheduled Meds: . amLODipine  10 mg Oral Daily  . atorvastatin  80 mg Oral q1800  . calcium-vitamin D  1 tablet Oral Q breakfast  . carvedilol  25 mg Oral BID WC  . cefUROXime (ZINACEF)  IV  1.5 g Intravenous Q12H  . docusate sodium  100 mg Oral Daily  . enoxaparin (LOVENOX) injection  30 mg Subcutaneous Q24H  . escitalopram  20 mg Oral Daily  . feeding supplement (NEPRO CARB STEADY)  237 mL Oral TID WC  . furosemide  40 mg Oral Daily  . gabapentin  100 mg Oral Daily  . gabapentin  200 mg Oral QHS  . insulin aspart  0-9 Units Subcutaneous TID WC  . insulin glargine  13 Units Subcutaneous QHS  . lisinopril   2.5 mg Oral Daily  . pantoprazole  40 mg Oral Daily  . phenylephrine (NEO-SYNEPHRINE) Adult infusion  30-200 mcg/min Intravenous To OR  . sodium chloride  3 mL Intravenous Q12H  . sodium chloride  3 mL Intravenous Q12H  . tiotropium  18 mcg Inhalation Daily   Continuous Infusions: . sodium chloride Stopped (08/07/13 1800)  . DOPamine       Charlynne Cousins  [name]  Pager [phone] If 8PM-8AM, please contact night-coverage at [email or web address], password [facility] 08/08/2013, 8:03 AM  LOS: 9 days

## 2013-08-08 NOTE — Progress Notes (Signed)
Physical Therapy Treatment - Re-evaluation Patient Details Name: Natalie Porter MRN: [id number] DOB: [birth year] Today's Date: 08/08/2013    History of Present Illness Patient is a 73 yo female admitted 07/30/13 with SOB and edema.  Patient with CHF.  Was scheduled for LLE bypass graft on 5/11 - cancelled due to admit.  Patient with LLE pain and non-healing wounds LLE/foot.  Patient with h/o DM, PAD, and HTN.  On 08/07/13 pt underwent Right to left femoral-femoral bypass, left femoral endarterectomy, attempted left iliac stent, placement of incisional VAC.    PT Comments    Pt with functional decline after recent surgery, and goals were updated to reflect this change. Per nursing, pt requires increased +2 assist for transfer bed<>chair. Feel that with RW pt can demonstrate increased participation for these transfers. Continue to recommend HHPT follow up at d/c with 24 hour assist at home. If pt does not demonstrate functional progress in following therapy sessions, may want to consider short term rehab at the SNF level to increase safety and independence prior to return home.    Follow Up Recommendations  Home health PT;Supervision/Assistance - 24 hour     Equipment Recommendations  None recommended by PT    Recommendations for Other Services       Precautions / Restrictions Precautions Precautions: Fall Precaution Comments: ischemic Lt foot Restrictions Weight Bearing Restrictions: No    Mobility  Bed Mobility Overal bed mobility: Needs Assistance Bed Mobility: Supine to Sit     Supine to sit: Mod assist;Min assist;+2 for physical assistance     General bed mobility comments: VC's for sequencing and technique. Min assist for movement of LE's towards EOB initially, with increased assist required for trunk elevation to full sitting position. Pt able to scoot herself to EOB and position feet flat on floor.   Transfers Overall transfer level: Needs assistance Equipment used:  Rolling walker (2 wheeled) Transfers: Sit to/from Stand Sit to Stand: Min assist;+2 safety/equipment Stand pivot transfers: Min assist       General transfer comment: VC's for hand placement on seated surface for safety. Increased time to initiate transfer to standing, however min assist to power-up to full standing.   Ambulation/Gait Ambulation/Gait assistance: Min guard;Min assist Ambulation Distance (Feet): 40 Feet Assistive device: Rolling walker (2 wheeled) Gait Pattern/deviations: Step-through pattern;Decreased stride length;Trunk flexed;Narrow base of support Gait velocity: Decreased Gait velocity interpretation: Below normal speed for age/gender General Gait Details: VC's for safety awareness with the RW. Occasional min assist required for directing RW. Pt became very nauseated during gait training and required seated rest break - gait training then ended.     Stairs            Wheelchair Mobility    Modified Rankin (Stroke Patients Only)       Balance Overall balance assessment: Needs assistance Sitting-balance support: Feet supported;No upper extremity supported Sitting balance-Leahy Scale: Fair     Standing balance support: Bilateral upper extremity supported Standing balance-Leahy Scale: Poor Standing balance comment: Feel pt would require use of RW for safe standing.                     Cognition Arousal/Alertness: Awake/alert Behavior During Therapy: WFL for tasks assessed/performed;Impulsive Overall Cognitive Status: Within Functional Limits for tasks assessed                      Exercises      General Comments General comments (skin integrity, edema, etc.): wound  VAC in place.  Pt required 2L 02.  Tried her on RA, but she desat'd to 68 with mobility       Pertinent Vitals/Pain On RA, pt desaturated to mid-high 80's. 2L/min supplemental O2 again donned and sats remained mid-high 90's throughout session.     Home Living  Family/patient expects to be discharged to:: Private residence Living Arrangements: Other relatives (granddaughter and her boyfriend) Available Help at Discharge: Family;Available 24 hours/day Type of Home: House Home Access: Ramped entrance   Home Layout: One level Home Equipment: Walker - 4 wheels;Electric scooter;Tub bench Additional Comments: Pt's granddaughter, whom she lives with is a CNA    Prior Function Level of Independence: Needs assistance  Gait / Transfers Assistance Needed: uses hover round, limited to household distance due to pain ADL's / Homemaking Assistance Needed: family assists with LB ADLs due to increased pain.  Pt reports she has not been able to access feet since ~ March 2015     PT Goals (current goals can now be found in the care plan section) Acute Rehab PT Goals Patient Stated Goal: "I could start doing some things for myself." PT Goal Formulation: With patient/family Time For Goal Achievement: 08/15/13 Potential to Achieve Goals: Good Progress towards PT goals: Goals downgraded-see care plan    Frequency  Min 3X/week    PT Plan Current plan remains appropriate    Co-evaluation PT/OT/SLP Co-Evaluation/Treatment: Yes Reason for Co-Treatment: For patient/therapist safety PT goals addressed during session: Mobility/safety with mobility;Balance;Proper use of DME OT goals addressed during session: ADL's and self-care     End of Session Equipment Utilized During Treatment: Gait belt;Oxygen Activity Tolerance: Patient limited by pain;Other (comment) (Limited by nausea) Patient left: in chair;with call bell/phone within reach;with family/visitor present     Time: 1135-1204 PT Time Calculation (min): 29 min  Charges:                       G CodesJolyn Lent 2013-09-02, 12:32 PM  Jolyn Lent, PT, DPT Acute Rehabilitation Services Pager: [phone]

## 2013-08-08 NOTE — Progress Notes (Addendum)
  [facility] Progress Note  08/08/2013 7:26 AM 1 Day Post-Op  Subjective:  C/o of some pain this morning, otherwise, doing ok  Tm 101.4 now 100.2 HR 50's regular 291'B-166'M systolic  Filed Vitals:   08/08/13 0315  BP: 143/53  Pulse: 57  Temp: 100.2 F (37.9 C)  Resp: 19    Physical Exam: Incisions:  Wound vac in place bilaterally with good seal Extremities:  + doppler signal PT/DP right and left.  Ulcer on left calf improved from last week.  Ulcer on dorsum of left foot is drying up.  Rubor of left foot improved.  Ulcers on toes of right foot improved.  CBC    Component Value Date/Time   WBC 10.4 08/08/2013 0335   RBC 3.05* 08/08/2013 0335   RBC 3.19* 08/03/2013 1013   HGB 9.0* 08/08/2013 0335   HCT 28.9* 08/08/2013 0335   PLT 146* 08/08/2013 0335   MCV 94.8 08/08/2013 0335   MCH 29.5 08/08/2013 0335   MCHC 31.1 08/08/2013 0335   RDW 15.6* 08/08/2013 0335   LYMPHSABS 1.7 07/31/2013 0440   MONOABS 1.1* 07/31/2013 0440   EOSABS 0.3 07/31/2013 0440   BASOSABS 0.0 07/31/2013 0440    BMET    Component Value Date/Time   NA 138 08/08/2013 0335   K 5.4* 08/08/2013 0335   CL 104 08/08/2013 0335   CO2 27 08/08/2013 0335   GLUCOSE 120* 08/08/2013 0335   BUN 39* 08/08/2013 0335   CREATININE 1.52* 08/08/2013 0335   CALCIUM 8.5 08/08/2013 0335   GFRNONAA 33* 08/08/2013 0335   GFRAA 38* 08/08/2013 0335    INR    Component Value Date/Time   INR 1.05 07/30/2013 1931     Intake/Output Summary (Last 24 hours) at 08/08/13 0726 Last data filed at 08/08/13 0315  Gross per 24 hour  Intake   1330 ml  Output   1600 ml  Net   -270 ml     Assessment:  73 y.o. female is s/p:  Right to left femoral-femoral bypass, left femoral endarterectomy, attempted left iliac stent, placement of incisional VAC  1 Day Post-Op  Plan: -doing well this am with + doppler signals in both feet. -fever this am with tm of 101.4 now 100.2.  U/a a couple of days ago normal.  Continue with IS  every hour while awake -creatinine up slightly this am at 1.52 from 1.22-continue IVF are running at 50 cc/hr -check labs in am. -continue to mobilize and OOB to chair tid  -keep vacs on until Thursday-will remove them then and hopefully okay to discharge then. -DVT prophylaxis:  Lovenox to start later this afternoon. -transfer to [place], PA-C [facility] [phone] 08/08/2013 7:26 AM    History and exam details as above. Good doppler signals feet warm Leave incisional VAC in place until Thursday  Artyom Stencel, MD [facility] [place] Office: [phone] Pager: [phone]

## 2013-08-08 NOTE — Evaluation (Signed)
Occupational Therapy Evaluation Patient Details Name: Natalie Porter MRN: [id number] DOB: 06-29-1940 Today's Date: 08/08/2013    History of Present Illness Patient is a 73 yo female admitted 07/30/13 with SOB and edema.  Patient with CHF.  Was scheduled for LLE bypass graft on 5/11 - cancelled due to admit.  Patient with LLE pain and non-healing wounds LLE/foot.  Patient with h/o DM, PAD, and HTN.  On 08/07/13 pt underwent Right to left femoral-femoral bypass, left femoral endarterectomy, attempted left iliac stent, placement of incisional VAC.   Clinical Impression   Pt admitted with above. She demonstrates the below listed deficits and will benefit from continued OT to maximize safety and independence with BADLs.  Pt requires max A for LB ADLs due to increased pain.  Family is supportive an able to provide 24 hour assistance at discharge.  She would benefit from instruction in AE use next visit.  Recommend HHOT       Follow Up Recommendations  Home health OT;Supervision/Assistance - 24 hour    Equipment Recommendations  3 in 1 bedside comode    Recommendations for Other Services       Precautions / Restrictions Precautions Precautions: Fall Precaution Comments: ischemic Lt foot Restrictions Weight Bearing Restrictions: No      Mobility Bed Mobility Overal bed mobility: Needs Assistance Bed Mobility: Supine to Sit     Supine to sit: Mod assist;Min assist;+2 for physical assistance     General bed mobility comments: VC's for sequencing and technique. Min assist for movement of LE's towards EOB initially, with increased assist required for trunk elevation to full sitting position. Pt able to scoot herself to EOB and position feet flat on floor.   Transfers Overall transfer level: Needs assistance Equipment used: Rolling walker (2 wheeled) Transfers: Sit to/from Stand Sit to Stand: Min assist;+2 safety/equipment Stand pivot transfers: Min assist       General transfer  comment: VC's for hand placement on seated surface for safety. Increased time to initiate transfer to standing, however min assist to power-up to full standing.     Balance Overall balance assessment: Needs assistance Sitting-balance support: Feet supported;Single extremity supported Sitting balance-Leahy Scale: Fair     Standing balance support: Bilateral upper extremity supported Standing balance-Leahy Scale: Poor                              ADL Overall ADL's : Needs assistance/impaired Eating/Feeding: Set up;Sitting   Grooming: Wash/dry face;Set up;Sitting   Upper Body Bathing: Minimal assitance;Sitting   Lower Body Bathing: Maximal assistance;Sit to/from stand   Upper Body Dressing : Minimal assistance;Sitting   Lower Body Dressing: Total assistance;Sit to/from stand   Toilet Transfer: Moderate assistance;Stand-pivot;BSC   Toileting- Clothing Manipulation and Hygiene: Maximal assistance;Sit to/from stand       Functional mobility during ADLs: Moderate assistance;Rolling walker General ADL Comments: Pt is limited with her abililty to access LEs due to increased pain      Vision                     Perception     Praxis Praxis Praxis tested?: Not tested    Pertinent Vitals/Pain 7/10 Groin pain.  RN notified.  Pt with sats 88% on RA;  94-96% on 2L     Hand Dominance Right   Extremity/Trunk Assessment Upper Extremity Assessment Upper Extremity Assessment: Generalized weakness   Lower Extremity Assessment Lower Extremity Assessment: Defer to  PT evaluation   Cervical / Trunk Assessment Cervical / Trunk Assessment: Normal   Communication Communication Communication: HOH   Cognition Arousal/Alertness: Awake/alert Behavior During Therapy: WFL for tasks assessed/performed;Impulsive Overall Cognitive Status: Within Functional Limits for tasks assessed                     General Comments       Exercises       Shoulder  Instructions      Home Living Family/patient expects to be discharged to:: Private residence Living Arrangements: Other relatives (granddaughter and her boyfriend) Available Help at Discharge: Family;Available 24 hours/day Type of Home: House Home Access: Ramped entrance     Home Layout: One level     Bathroom Shower/Tub: Tub/shower unit Shower/tub characteristics: Architectural technologist: Standard Bathroom Accessibility: Yes How Accessible: Accessible via walker Home Equipment: [place] - 4 wheels;Electric scooter;Tub bench   Additional Comments: Pt's granddaughter, whom she lives with is a CNA      Prior Functioning/Environment Level of Independence: Needs assistance  Gait / Transfers Assistance Needed: uses hover round, limited to household distance due to pain ADL's / Homemaking Assistance Needed: family assists with LB ADLs due to increased pain.  Pt reports she has not been able to access feet since ~ March 2015        OT Diagnosis: Generalized weakness;Acute pain   OT Problem List: Decreased strength;Decreased activity tolerance;Impaired balance (sitting and/or standing);Decreased knowledge of use of DME or AE;Decreased safety awareness;Pain   OT Treatment/Interventions: Self-care/ADL training;DME and/or AE instruction;Therapeutic activities;Patient/family education    OT Goals(Current goals can be found in the care plan section) Acute Rehab OT Goals Patient Stated Goal: "I could start doing some things for myself." OT Goal Formulation: With patient/family Time For Goal Achievement: 08/22/13 Potential to Achieve Goals: Good ADL Goals Pt Will Perform Grooming: with min guard assist;standing Pt Will Perform Lower Body Bathing: with min guard assist;with adaptive equipment;sit to/from stand Pt Will Perform Lower Body Dressing: with min guard assist;with adaptive equipment;sit to/from stand Pt Will Transfer to Toilet: with min guard assist;ambulating;regular height  toilet;bedside commode;grab bars Pt Will Perform Toileting - Clothing Manipulation and hygiene: with min guard assist;sit to/from stand  OT Frequency: Min 2X/week   Barriers to D/C:            Co-evaluation PT/OT/SLP Co-Evaluation/Treatment: Yes Reason for Co-Treatment: For patient/therapist safety PT goals addressed during session: Mobility/safety with mobility;Balance;Proper use of DME OT goals addressed during session: ADL's and self-care      End of Session Equipment Utilized During Treatment: Gait belt;Oxygen;Rolling walker Nurse Communication: Mobility status  Activity Tolerance: Patient limited by pain Patient left: in bed;with call bell/phone within reach;with nursing/sitter in room;with family/visitor present   Time: 1135-1204 OT Time Calculation (min): 29 min Charges:  OT General Charges $OT Visit: 1 Procedure OT Evaluation $Initial OT Evaluation Tier I: 1 Procedure OT Treatments $Therapeutic Activity: 8-22 mins G-Codes:    Payten Beaumier M Nalu [place] 08/26/13, 12:31 PM

## 2013-08-08 NOTE — Progress Notes (Signed)
Pt received into room 2w32, pt in bed with daughter at bedside, pt on 2L [place], tele placed on pt, wound vacs on bilateral groins intact, pt states no complaints at this time, will continue to monitor pt Rickard Rhymes, RN

## 2013-08-09 DIAGNOSIS — N183 Chronic kidney disease, stage 3 unspecified: Secondary | ICD-10-CM

## 2013-08-09 DIAGNOSIS — I739 Peripheral vascular disease, unspecified: Secondary | ICD-10-CM

## 2013-08-09 DIAGNOSIS — E785 Hyperlipidemia, unspecified: Secondary | ICD-10-CM

## 2013-08-09 LAB — GLUCOSE, CAPILLARY
GLUCOSE-CAPILLARY: 181 mg/dL — AB (ref 70–99)
GLUCOSE-CAPILLARY: 179 mg/dL — AB (ref 70–99)
Glucose-Capillary: 192 mg/dL — ABNORMAL HIGH (ref 70–99)
Glucose-Capillary: 198 mg/dL — ABNORMAL HIGH (ref 70–99)

## 2013-08-09 LAB — BASIC METABOLIC PANEL
BUN: 45 mg/dL — ABNORMAL HIGH (ref 6–23)
CO2: 26 meq/L (ref 19–32)
CREATININE: 1.5 mg/dL — AB (ref 0.50–1.10)
Calcium: 8.1 mg/dL — ABNORMAL LOW (ref 8.4–10.5)
Chloride: 100 mEq/L (ref 96–112)
GFR calc Af Amer: 39 mL/min — ABNORMAL LOW (ref >90)
GFR calc non Af Amer: 33 mL/min — ABNORMAL LOW (ref >90)
GLUCOSE: 223 mg/dL — AB (ref 70–99)
Potassium: 4.4 mEq/L (ref 3.7–5.3)
Sodium: 137 mEq/L (ref 137–147)

## 2013-08-09 LAB — CBC
HCT: 27.1 % — ABNORMAL LOW (ref 36.0–46.0)
Hemoglobin: 8.6 g/dL — ABNORMAL LOW (ref 12.0–15.0)
MCH: 30.1 pg (ref 26.0–34.0)
MCHC: 31.7 g/dL (ref 30.0–36.0)
MCV: 94.8 fL (ref 78.0–100.0)
Platelets: 134 10*3/uL — ABNORMAL LOW (ref 150–400)
RBC: 2.86 MIL/uL — ABNORMAL LOW (ref 3.87–5.11)
RDW: 15.7 % — AB (ref 11.5–15.5)
WBC: 12.5 10*3/uL — ABNORMAL HIGH (ref 4.0–10.5)

## 2013-08-09 MED ORDER — FUROSEMIDE 20 MG PO TABS
20.0000 mg | ORAL_TABLET | Freq: Every day | ORAL | Status: DC
  Administered 2013-08-09 – 2013-08-10 (×2): 20 mg via ORAL
  Filled 2013-08-09 (×3): qty 1

## 2013-08-09 NOTE — Progress Notes (Addendum)
  [facility] Progress Note  08/09/2013 8:34 AM 2 Days Post-Op  Subjective:  Feeling better-less sore  Tm 100.5 now afebrile VSS  Filed Vitals:   08/09/13 0533  BP: 126/53  Pulse: 50  Temp: 98.3 F (36.8 C)  Resp: 16    Physical Exam: Incisions:  Bilateral incisional vacs are in place with good seal. Extremities:  There is a good doppler signal within the fem-fem bypass graft.  Bilateral feet are warm.  Left calf ulcer improving.  Dorsum ulcer of left foot is improving and drying up.  Rubor continues to improve.  CBC    Component Value Date/Time   WBC 12.5* 08/09/2013 0415   RBC 2.86* 08/09/2013 0415   RBC 3.19* 08/03/2013 1013   HGB 8.6* 08/09/2013 0415   HCT 27.1* 08/09/2013 0415   PLT 134* 08/09/2013 0415   MCV 94.8 08/09/2013 0415   MCH 30.1 08/09/2013 0415   MCHC 31.7 08/09/2013 0415   RDW 15.7* 08/09/2013 0415   LYMPHSABS 1.7 07/31/2013 0440   MONOABS 1.1* 07/31/2013 0440   EOSABS 0.3 07/31/2013 0440   BASOSABS 0.0 07/31/2013 0440    BMET    Component Value Date/Time   NA 137 08/09/2013 0415   K 4.4 08/09/2013 0415   CL 100 08/09/2013 0415   CO2 26 08/09/2013 0415   GLUCOSE 223* 08/09/2013 0415   BUN 45* 08/09/2013 0415   CREATININE 1.50* 08/09/2013 0415   CALCIUM 8.1* 08/09/2013 0415   GFRNONAA 33* 08/09/2013 0415   GFRAA 39* 08/09/2013 0415    INR    Component Value Date/Time   INR 1.05 07/30/2013 1931     Intake/Output Summary (Last 24 hours) at 08/09/13 0834 Last data filed at 08/09/13 3790  Gross per 24 hour  Intake   1440 ml  Output    300 ml  Net   1140 ml     Assessment:  73 y.o. female is s/p:  Right to left femoral-femoral bypass, left femoral endarterectomy, attempted left iliac stent, placement of incisional VAC   2 Days Post-Op  Plan: -vacs off tomorrow -possible home tomorrow -mobilize and out of bed today -DVT prophylaxis:  Lovenox   Leontine Locket, PA-C [facility] [phone] 08/09/2013 8:34  AM    Continues to do well Feet warm bilaterally VAC in place in groins Some ankle/pedal edema Creatinine stable Still with mild leukocytosis  Will d/c VAC tomorrow Needs to ambulate today Possible d/c tomorrow if medical issues stable  Ruta Hinds, MD [facility] [place] Office: [phone] Pager: [phone]

## 2013-08-09 NOTE — Progress Notes (Addendum)
VASCULAR LAB PRELIMINARY  ARTERIAL  ABI completed:    RIGHT    LEFT    PRESSURE WAVEFORM  PRESSURE WAVEFORM  BRACHIAL 162 Triphasic BRACHIAL 160 Triphasic  DP >301 Monophasic sounds Biphasic DP 150 Dampened Monophasic  PT 87 Monophasic PT 63 Dampened Monophasic    RIGHT LEFT  ABI DP N/A PT 0.54 DP 0.93 PT 0.39   Right - ABI could not be ascertained in the DP due to incompressible vessels. The PT indicates a moderate to severe reduction in arterial flow. Left - ABI in the DP indicates normal arterial flow with the PT indicating a severe reduction in arterial flow. Doppler waveforms might suggest a false elevation of pressures bilaterally.  [place], [place] 08/09/2013, 1:51 PM

## 2013-08-09 NOTE — Progress Notes (Signed)
Pt placed on CPAP of 12 cmH2O with 2 LPM oxygen bled in. Pt has a nasal mask. Pt is comfortable, no distress noted.

## 2013-08-09 NOTE — Progress Notes (Signed)
[name] PROGRESS NOTE Interim History: 73 year old female with a past medical history of type 2 diabetes mellitus, peripheral arterial disease, hypertension, admitted to the medicine service on 07/30/2013. She was scheduled to undergo fem-fem bypass on 07/31/2013, procedure to be performed by Dr. Orland Jarred of vascular. she presented to the emergency department on 07/30/2013 with complaints of increasing shortness of breath and worsening bilateral extremity pain and edema. Chest x-ray on admission revealed findings suggestive of CHF/fluid overload. Labs also revealed the presence of acute renal failure with creatinine of 2.12. Patient was started on IV Lasix 40 mg twice a day. She showed clinical improvement and by 08/01/2013 her weight had come down from 97.8 kg to 95.3 kg. With regard to peripheral arterial disease and she was started on IV heparin and has been evaluated by Dr. Oneida Alar during this hospitalization. On 5/18 she had a Right to left femoral-femoral bypass, left femoral endarterectomy, attempted left iliac stent, placement of incisional VAC.   Filed Weights   08/07/13 0547 08/07/13 1439 08/09/13 0500  Weight: 88.9 kg (195 lb 15.8 oz) 90.7 kg (199 lb 15.3 oz) 92.3 kg (203 lb 7.8 oz)        Intake/Output Summary (Last 24 hours) at 08/09/13 0932 Last data filed at 08/09/13 9629  Gross per 24 hour  Intake   1440 ml  Output    300 ml  Net   1140 ml     Assessment/Plan: Acute on chronic diastolic congestive heart failure - Recent transthoracic echocardiogram performed 07/24/2013 showing preserved ejection fraction of 60-65%, E/e'>10 suggestive of elevated filling pressures - Cont to be negative, weight down from 97->90.6-> 88 kg (dry weight 88 kg)  - Start low dose lasix, in anticipation of DC home soon.  Acute on chronic renal failure/Cardiorenal syndrome:  - Baseline creatinine of around 1.3-1.6. - Resolved with Diuresis. - Watch as we are restarting lasix (low dose)  today.  Peripheral Arterial Disease  -Patient S/p  Fem-Fem bypass 5.18.2015.  Fever: -T max 100.5. -Source unclear: UA, CXR without signs of acute infection. - Given 1 dose of cefuroxime on 5.18.2015. - Blood cx remain negative to date.  Hypertension  - Blood pressures stable,  - Remains on Norvasc, low dose ACE-I and Coreg.   Hyperkalemia  - Resolved with diuresis and kayexelate. - Was getting supplementation prior to surgery will hold.  DM II -Fair control.    Code Status: Full Code  Family Communication: Daughter at bedside updated on plan of care. Disposition Plan:Anticiipate home in am with [facility] if ok with vascular and remains afebrile overnight.  Consultants:  vascular  Procedures: ECHO: as above  Antibiotics:  None  HPI/Subjective: No complains. Feels well. Ready to go home today.  Objective: Filed Vitals:   08/08/13 2226 08/08/13 2343 08/09/13 0500 08/09/13 0533  BP: 145/63   126/53  Pulse:    50  Temp:  99.1 F (37.3 C)  98.3 F (36.8 C)  TempSrc:    Oral  Resp:    16  Height:      Weight:   92.3 kg (203 lb 7.8 oz)   SpO2:    94%     Exam:  General: Alert, awake, oriented x3, in no acute distress.  HEENT: No bruits, no goiter. -JVD Heart: Regular rate and rhythm. Lungs: Good air movement, clear Abdomen: Soft, nontender, nondistended, positive bowel sounds.    Data Reviewed: Basic Metabolic Panel:  Recent Labs Lab 08/04/13 0415 08/05/13 1009 08/07/13 0419 08/08/13 0335 08/09/13  0415  NA 138 142 139 138 137  K 4.3 4.7 4.3 5.4* 4.4  CL 102 103 101 104 100  CO2 24 23 26 27 26   GLUCOSE 104* 198* 139* 120* 223*  BUN 40* 34* 42* 39* 45*  CREATININE 1.21* 1.10 1.22* 1.52* 1.50*  CALCIUM 9.0 9.2 8.9 8.5 8.1*   Liver Function Tests: No results found for this basename: AST, ALT, ALKPHOS, BILITOT, PROT, ALBUMIN,  in the last 168 hours No results found for this basename: LIPASE, AMYLASE,  in the last 168 hours No results found for  this basename: AMMONIA,  in the last 168 hours CBC:  Recent Labs Lab 08/05/13 1009 08/06/13 0034 08/07/13 0419 08/08/13 0335 08/09/13 0415  WBC 6.9 7.7 7.5 10.4 12.5*  HGB 10.0* 10.1* 9.4* 9.0* 8.6*  HCT 31.8* 32.8* 30.0* 28.9* 27.1*  MCV 93.8 95.1 94.0 94.8 94.8  PLT 191 175 175 146* 134*   Cardiac Enzymes: No results found for this basename: CKTOTAL, CKMB, CKMBINDEX, TROPONINI,  in the last 168 hours BNP (last 3 results)  Recent Labs  07/30/13 1931  PROBNP 3021.0*   CBG:  Recent Labs Lab 08/08/13 0759 08/08/13 1221 08/08/13 1636 08/08/13 2129 08/09/13 0625  GLUCAP 139* 234* 215* 228* 181*    Recent Results (from the past 240 hour(s))  SURGICAL PCR SCREEN     Status: None   Collection Time    08/05/13 11:23 PM      Result Value Ref Range Status   MRSA, PCR NEGATIVE  NEGATIVE Final   Staphylococcus aureus NEGATIVE  NEGATIVE Final   Comment:            The Xpert SA Assay (FDA     approved for NASAL specimens     in patients over 22 years of age),     is one component of     a comprehensive surveillance     program.  Test performance has     been validated by [employer] for patients greater     than or equal to 85 year old.     It is not intended     to diagnose infection nor to     guide or monitor treatment.     Studies: Dg Chest 1 View  08/08/2013   CLINICAL DATA:  Fever.  EXAM: CHEST - 1 VIEW  COMPARISON:  DG CHEST 1V PORT dated 07/30/2013; CT CHEST W/O CM dated 04/27/2013  FINDINGS: Cardiomegaly with mild pulmonary vascular prominence and interstitial prominence noted suggesting mild congestive heart failure. Underlying pneumonitis cannot be excluded. No pleural effusion or pneumothorax. Metallic density noted over the left apex. No acute bony abnormality.  IMPRESSION: Findings suggesting persistent congestive heart failure with mild interstitial edema. Underlying lying pneumonitis cannot be excluded.   Electronically Signed   By: Marcello Moores  Register    On: 08/08/2013 08:39    Scheduled Meds: . amLODipine  10 mg Oral Daily  . atorvastatin  80 mg Oral q1800  . calcium-vitamin D  1 tablet Oral Q breakfast  . carvedilol  25 mg Oral BID WC  . docusate sodium  100 mg Oral Daily  . enoxaparin (LOVENOX) injection  30 mg Subcutaneous Q24H  . escitalopram  20 mg Oral Daily  . feeding supplement (NEPRO CARB STEADY)  237 mL Oral TID WC  . gabapentin  100 mg Oral Daily  . gabapentin  200 mg Oral QHS  . insulin aspart  0-9 Units Subcutaneous TID WC  .  insulin glargine  13 Units Subcutaneous QHS  . pantoprazole  40 mg Oral Daily  . sodium chloride  3 mL Intravenous Q12H  . tiotropium  18 mcg Inhalation Daily   Continuous Infusions:     Erline Hau  [name] Pager [phone] If 8PM-8AM, please contact night-coverage at [email or web address], password [facility] 08/09/2013, 9:32 AM  LOS: 10 days

## 2013-08-09 NOTE — Progress Notes (Signed)
Inpatient Diabetes Program Recommendations  AACE/ADA: New Consensus Statement on Inpatient Glycemic Control (2013)  Target Ranges:  Prepandial:   less than 140 mg/dL      Peak postprandial:   less than 180 mg/dL (1-2 hours)      Critically ill patients:  140 - 180 mg/dL  Results for Natalie Porter, Natalie Porter (MRN [id number]) as of 08/09/2013 08:54  Ref. Range 08/08/2013 07:59 08/08/2013 12:21 08/08/2013 16:36 08/08/2013 21:29 08/09/2013 06:25  Glucose-Capillary Latest Range: 70-99 mg/dL 139 (H) 234 (H) 215 (H) 228 (H) 181 (H)   Consider adding Novolog prandial dose 3 units TID for elevated postprandial CBGs. Thank you  Raoul Pitch BSN, RN,CDE Inpatient Diabetes Coordinator [phone] (team pager)

## 2013-08-10 DIAGNOSIS — N183 Chronic kidney disease, stage 3 unspecified: Secondary | ICD-10-CM

## 2013-08-10 LAB — CBC
HCT: 24.7 % — ABNORMAL LOW (ref 36.0–46.0)
Hemoglobin: 7.9 g/dL — ABNORMAL LOW (ref 12.0–15.0)
MCH: 30.5 pg (ref 26.0–34.0)
MCHC: 32 g/dL (ref 30.0–36.0)
MCV: 95.4 fL (ref 78.0–100.0)
Platelets: 131 10*3/uL — ABNORMAL LOW (ref 150–400)
RBC: 2.59 MIL/uL — ABNORMAL LOW (ref 3.87–5.11)
RDW: 15.6 % — ABNORMAL HIGH (ref 11.5–15.5)
WBC: 9.7 10*3/uL (ref 4.0–10.5)

## 2013-08-10 LAB — BASIC METABOLIC PANEL
BUN: 52 mg/dL — ABNORMAL HIGH (ref 6–23)
CALCIUM: 7.6 mg/dL — AB (ref 8.4–10.5)
CHLORIDE: 99 meq/L (ref 96–112)
CO2: 26 meq/L (ref 19–32)
Creatinine, Ser: 1.55 mg/dL — ABNORMAL HIGH (ref 0.50–1.10)
GFR calc Af Amer: 37 mL/min — ABNORMAL LOW (ref >90)
GFR calc non Af Amer: 32 mL/min — ABNORMAL LOW (ref >90)
Glucose, Bld: 193 mg/dL — ABNORMAL HIGH (ref 70–99)
Potassium: 4.1 mEq/L (ref 3.7–5.3)
SODIUM: 136 meq/L — AB (ref 137–147)

## 2013-08-10 LAB — GLUCOSE, CAPILLARY
GLUCOSE-CAPILLARY: 246 mg/dL — AB (ref 70–99)
Glucose-Capillary: 161 mg/dL — ABNORMAL HIGH (ref 70–99)

## 2013-08-10 MED ORDER — BISACODYL 10 MG RE SUPP
10.0000 mg | Freq: Once | RECTAL | Status: AC
  Administered 2013-08-10: 10 mg via RECTAL
  Filled 2013-08-10: qty 1

## 2013-08-10 MED ORDER — FUROSEMIDE 20 MG PO TABS: 20.0000 mg | ORAL_TABLET | Freq: Every day | ORAL | Status: DC

## 2013-08-10 MED ORDER — HYDROCODONE-ACETAMINOPHEN 10-325 MG PO TABS: 1.0000 | ORAL_TABLET | Freq: Four times a day (QID) | ORAL | Status: DC | PRN

## 2013-08-10 MED ORDER — MAGNESIUM CITRATE PO SOLN
300.0000 mL | Freq: Once | ORAL | Status: DC
  Filled 2013-08-10: qty 592

## 2013-08-10 NOTE — Discharge Summary (Signed)
Physician Discharge Summary  Natalie Porter GBT:[id number] DOB: 28-Apr-1940 DOA: 07/30/2013  PCP: No PCP Per Patient  Admit date: 07/30/2013 Discharge date: 08/10/2013  Time spent: 45 minutes  Recommendations for Outpatient Follow-up:  -Will be discharged home today. -Has follow up with her PCP next week and with Dr. Oneida Alar in 2 weeks. -Will arrange [facility] services prior to DC. -Will need to discuss with PCP need for continuing on coumadin (has been discontinued on DC).   Discharge Diagnoses:  Principal Problem:   Diastolic CHF, acute on chronic Active Problems:   PAD (peripheral artery disease)   ARF (acute renal failure)   Anemia   Hypertension   HLD (hyperlipidemia)   Diabetes mellitus   Cardiorenal syndrome   CKD (chronic kidney disease) stage 3, GFR 30-59 ml/min   Discharge Condition: Stable and improved  Filed Weights   08/07/13 1439 08/09/13 0500 08/10/13 0456  Weight: 90.7 kg (199 lb 15.3 oz) 92.3 kg (203 lb 7.8 oz) 90.6 kg (199 lb 11.8 oz)    History of present illness:  Natalie Porter is a 73 y.o. female with history of diabetes mellitus, peripheral arterial disease, hypertension, probable chronic kidney disease, chronic anemia, DVT who was recently admitted for left lower extremity pain and had preoperative cardiology evaluation including Myoview and cardiac catheter. Patient's cardiac catheter showing nonobstructive CAD and was cleared for surgery and patient was only scheduled for surgery for tomorrow was experiencing shortness of breath since morning with chest tightness. Patient gets easily short of breath on minimal movements. Patient states for the last few days patient has gained 15 pounds. In the ER chest x-ray was showing features concerning for CHF. Patient's creatinine also has increased from baseline. Patient was given Lasix 40 mg IV one dose and admitted for further workup. On exam patient has significant edema of the lower extremities. Patient's left  lower extremity is erythematous and patient's family states that it has actually improved from last week. Patient denies any nausea vomiting abdominal pain diarrhea fever chills productive cough. Hospitalist admission was requested.   Hospital Course:   Acute on chronic diastolic congestive heart failure  - Recent transthoracic echocardiogram performed 07/24/2013 showing preserved ejection fraction of 60-65%, E/e'>10 suggestive of elevated filling pressures  - Cont to be negative, weight down from 97->90.6-> 88 kg (dry weight 88 kg)  - Will continue PO lasix and plan for DC home today.   Acute on chronic renal failure/Cardiorenal syndrome:  - Baseline creatinine of around 1.3-1.6.  - Resolved with Diuresis.  - Cr is 1.55 on DC.  Peripheral Arterial Disease  -Patient S/p Fem-Fem bypass 5.18.2015.  -Per Dr. Oneida Alar ok to DC home today.  Fever:  -T max 100.5.  -Source unclear: UA, CXR without signs of acute infection. ? Groin wound (looks clean and VAC is out). - Given 1 dose of cefuroxime on 5.18.2015.  - Blood cx remain negative to date.   Hypertension  - Blood pressures stable,  - Remains on Norvasc, low dose ACE-I and Coreg.   Hyperkalemia  - Resolved with diuresis and kayexelate.  - Was getting supplementation prior to surgery will hold.   DM II  -Fair control.  -Continue home regimen.  Remote DVT -3 yrs ago per patient. -Geanie Logan is she even needs to continue coumadin at this point. -Coumadin has been discontinued for surgery, and has not been restarted on DC. -Advised to discuss this with PCP upon hospital follow up appointment next week.     Procedures:  None   Consultations:  Fields, Vascular Surgery  Discharge Instructions      Discharge Instructions   Diet - low sodium heart healthy    Complete by:  As directed      Discontinue IV    Complete by:  As directed      Increase activity slowly    Complete by:  As directed             Medication  List         acetaminophen 500 MG tablet  Commonly known as:  TYLENOL  Take 500 mg by mouth every 6 (six) hours as needed.     albuterol 108 (90 BASE) MCG/ACT inhaler  Commonly known as:  PROVENTIL HFA;VENTOLIN HFA  Inhale 2 puffs into the lungs every 6 (six) hours as needed for wheezing or shortness of breath.     amLODipine 10 MG tablet  Commonly known as:  NORVASC  Take 10 mg by mouth daily.     calcium-vitamin D 250-125 MG-UNIT per tablet  Commonly known as:  OSCAL  Take 1 tablet by mouth 2 (two) times daily.     carvedilol 25 MG tablet  Commonly known as:  COREG  Take 25 mg by mouth 2 (two) times daily with a meal.     chlorthalidone 50 MG tablet  Commonly known as:  HYGROTON  Take 50 mg by mouth daily.     escitalopram 20 MG tablet  Commonly known as:  LEXAPRO  Take 20 mg by mouth daily.     furosemide 20 MG tablet  Commonly known as:  LASIX  Take 1 tablet (20 mg total) by mouth daily.     gabapentin 100 MG capsule  Commonly known as:  NEURONTIN  Take 100 mg by mouth 2 (two) times daily. 100 mg in the morning and 200 mg at bedtime     glipiZIDE 5 MG 24 hr tablet  Commonly known as:  GLUCOTROL XL  Take 10 mg by mouth daily with breakfast.     HYDROcodone-acetaminophen 10-325 MG per tablet  Commonly known as:  NORCO  Take 1 tablet by mouth every 6 (six) hours as needed.     insulin glargine 100 UNIT/ML injection  Commonly known as:  LANTUS  Inject 13 Units into the skin at bedtime.     pioglitazone 45 MG tablet  Commonly known as:  ACTOS  Take 45 mg by mouth daily.     rosuvastatin 40 MG tablet  Commonly known as:  CRESTOR  Take 40 mg by mouth daily.     TUDORZA PRESSAIR 400 MCG/ACT Aepb  Generic drug:  Aclidinium Bromide  Inhale 1 puff into the lungs every 12 (twelve) hours.       Allergies  Allergen Reactions  . Codeine Nausea Only  . Morphine And Related Nausea And Vomiting   Follow-up Information   Schedule an appointment as soon as  possible for a visit in 1 week to follow up. (as already scheduled)       Follow up with Elam Dutch, MD. Schedule an appointment as soon as possible for a visit in 2 weeks. (Office will call you with appointment)    Specialty:  Vascular Surgery   Contact information:   [address] [place] [address] [phone]        The results of significant diagnostics from this hospitalization (including imaging, microbiology, ancillary and laboratory) are listed below for reference.    Significant Diagnostic Studies: Dg Chest  1 View  08/08/2013   CLINICAL DATA:  Fever.  EXAM: CHEST - 1 VIEW  COMPARISON:  DG CHEST 1V PORT dated 07/30/2013; CT CHEST W/O CM dated 04/27/2013  FINDINGS: Cardiomegaly with mild pulmonary vascular prominence and interstitial prominence noted suggesting mild congestive heart failure. Underlying pneumonitis cannot be excluded. No pleural effusion or pneumothorax. Metallic density noted over the left apex. No acute bony abnormality.  IMPRESSION: Findings suggesting persistent congestive heart failure with mild interstitial edema. Underlying lying pneumonitis cannot be excluded.   Electronically Signed   By: Marcello Moores  Register   On: 08/08/2013 08:39   Dg Chest 2 View  07/22/2013   CLINICAL DATA:  Preop.  History of tobacco use.  EXAM: CHEST  2 VIEW  COMPARISON:  09/26/2012  FINDINGS: Cardiac silhouette is mildly enlarged. Normal mediastinal Hand hilar contours. A small wire and vascular clips are noted in the left upper lobe near the apex, stable. Lungs are clear. No effusion. No pneumothorax.  Bony thorax is grossly intact.  IMPRESSION: No acute cardiopulmonary disease.   Electronically Signed   By: Lajean Manes M.D.   On: 07/22/2013 15:03   US Renal  07/31/2013   CLINICAL DATA:  Acute renal failure  EXAM: RENAL/URINARY TRACT ULTRASOUND COMPLETE  COMPARISON:  None.  FINDINGS: Right Kidney:  Length: 13.1 cm in length. Echogenicity within normal limits. No mass or hydronephrosis  visualized.  Left Kidney:  Length: 12.2 cm in. Echogenicity within normal limits. No mass or hydronephrosis visualized.  Additional findings: Cholelithiasis.  Bladder:  Appears normal for degree of bladder distention.  IMPRESSION: Within normal limits.  No hydronephrosis.   Electronically Signed   By: Maryclare Bean M.D.   On: 07/31/2013 16:41   Nm Myocar Multi W/spect W/wall Motion / Ef  07/25/2013   CLINICAL DATA:  73 year old female undergoing nuclear medicine cardiac stress test for preoperative cardiac clearance.  EXAM: MYOCARDIAL IMAGING WITH SPECT (REST AND PHARMACOLOGIC-STRESS)  GATED LEFT VENTRICULAR WALL MOTION STUDY  LEFT VENTRICULAR EJECTION FRACTION  TECHNIQUE: Standard myocardial SPECT imaging was performed after resting intravenous injection of 10 mCi Tc-41m sestamibi. Subsequently, intravenous infusion of Lexiscan was performed under the supervision of the Cardiology staff. At peak effect of the drug, 30 mCi Tc-43m sestamibi was injected intravenously and standard myocardial SPECT imaging was performed. Quantitative gated imaging was also performed to evaluate left ventricular wall motion, and estimate left ventricular ejection fraction.  COMPARISON:  CTA runoff 06/27/2013  FINDINGS: Evaluation of the cardiac gated data demonstrates an end-diastolic volume of 967 mL and an end systolic volume of 34 mL yielding a calculated ejection fraction of 66%. No evidence of global or focal ventricular hypokinesis.  Evaluation of the static resting and post pharmacological stress images demonstrates symmetric radiotracer uptake at rest. However, there is a moderate focus of decreased radiotracer uptake in the anterior wall thickening in the mid ventricle and extending to the apex consistent with a region of inducible ischemia. No associated wall motion abnormality. No transient ischemic dilatation.  IMPRESSION: 1. Positive for moderate focus of inducible ischemia in the mid ventricular anterior wall extending  toward the apex. 2. Normal cardiac wall motion. 3. Calculated ejection fraction 66%. These results will be called to the ordering clinician or representative by the Radiologist Assistant, and communication documented in the PACS Dashboard.   Electronically Signed   By: Jacqulynn Cadet M.D.   On: 07/25/2013 17:19   Dg Chest Port 1 View  07/30/2013   CLINICAL DATA:  One day history  of shortness of breath.  EXAM: PORTABLE CHEST - 1 VIEW  COMPARISON:  Two-view chest x-ray 07/22/2013, 09/26/2012.  FINDINGS: Cardiac silhouette moderately enlarged but stable. Pulmonary venous hypertension and minimal to mild interstitial pulmonary edema. Possible small bilateral pleural effusions. No confluent airspace consolidation.  IMPRESSION: Mild CHF and/or fluid overload, with cardiomegaly and minimal to mild diffuse interstitial pulmonary edema. Possible small bilateral pleural effusions.   Electronically Signed   By: Evangeline Dakin M.D.   On: 07/30/2013 20:07    Microbiology: Recent Results (from the past 240 hour(s))  SURGICAL PCR SCREEN     Status: None   Collection Time    08/05/13 11:23 PM      Result Value Ref Range Status   MRSA, PCR NEGATIVE  NEGATIVE Final   Staphylococcus aureus NEGATIVE  NEGATIVE Final   Comment:            The Xpert SA Assay (FDA     approved for NASAL specimens     in patients over 50 years of age),     is one component of     a comprehensive surveillance     program.  Test performance has     been validated by [employer] for patients greater     than or equal to 33 year old.     It is not intended     to diagnose infection nor to     guide or monitor treatment.  CULTURE, BLOOD (ROUTINE X 2)     Status: None   Collection Time    08/08/13 10:15 AM      Result Value Ref Range Status   Specimen Description BLOOD RIGHT ANTECUBITAL   Final   Special Requests BOTTLES DRAWN AEROBIC AND ANAEROBIC 10CC   Final   Culture  Setup Time     Final   Value: 08/08/2013 16:25      Performed at [facility]   Culture     Final   Value:        BLOOD CULTURE RECEIVED NO GROWTH TO DATE CULTURE WILL BE HELD FOR 5 DAYS BEFORE ISSUING A FINAL NEGATIVE REPORT     Performed at [facility]   Report Status PENDING   Incomplete  CULTURE, BLOOD (ROUTINE X 2)     Status: None   Collection Time    08/08/13 10:20 AM      Result Value Ref Range Status   Specimen Description BLOOD LEFT ANTECUBITAL   Final   Special Requests BOTTLES DRAWN AEROBIC AND ANAEROBIC 10CC   Final   Culture  Setup Time     Final   Value: 08/08/2013 16:25     Performed at [facility]   Culture     Final   Value:        BLOOD CULTURE RECEIVED NO GROWTH TO DATE CULTURE WILL BE HELD FOR 5 DAYS BEFORE ISSUING A FINAL NEGATIVE REPORT     Performed at [facility]   Report Status PENDING   Incomplete     Labs: Basic Metabolic Panel:  Recent Labs Lab 08/05/13 1009 08/07/13 0419 08/08/13 0335 08/09/13 0415 08/10/13 0325  NA 142 139 138 137 136*  K 4.7 4.3 5.4* 4.4 4.1  CL 103 101 104 100 99  CO2 23 26 27 26 26   GLUCOSE 198* 139* 120* 223* 193*  BUN 34* 42* 39* 45* 52*  CREATININE 1.10 1.22* 1.52* 1.50* 1.55*  CALCIUM 9.2  8.9 8.5 8.1* 7.6*   Liver Function Tests: No results found for this basename: AST, ALT, ALKPHOS, BILITOT, PROT, ALBUMIN,  in the last 168 hours No results found for this basename: LIPASE, AMYLASE,  in the last 168 hours No results found for this basename: AMMONIA,  in the last 168 hours CBC:  Recent Labs Lab 08/06/13 0034 08/07/13 0419 08/08/13 0335 08/09/13 0415 08/10/13 0325  WBC 7.7 7.5 10.4 12.5* 9.7  HGB 10.1* 9.4* 9.0* 8.6* 7.9*  HCT 32.8* 30.0* 28.9* 27.1* 24.7*  MCV 95.1 94.0 94.8 94.8 95.4  PLT 175 175 146* 134* 131*   Cardiac Enzymes: No results found for this basename: CKTOTAL, CKMB, CKMBINDEX, TROPONINI,  in the last 168 hours BNP: BNP (last 3 results)  Recent Labs  07/30/13 1931  PROBNP 3021.0*   CBG:  Recent  Labs Lab 08/09/13 0625 08/09/13 1111 08/09/13 1628 08/09/13 2053 08/10/13 0625  GLUCAP 181* 192* 198* 179* 161*       Signed:  Erline Hau  [name] Pager: [phone] 08/10/2013, 10:30 AM

## 2013-08-10 NOTE — Progress Notes (Signed)
Occupational Therapy Treatment Patient Details Name: Natalie Porter MRN: [id number] DOB: 07/25/1940 Today's Date: 08/10/2013    History of present illness Patient is a 73 yo female admitted 07/30/13 with SOB and edema.  Patient with CHF.  Was scheduled for LLE bypass graft on 5/11 - cancelled due to admit.  Patient with LLE pain and non-healing wounds LLE/foot.  Patient with h/o DM, PAD, and HTN.  On 08/07/13 pt underwent Right to left femoral-femoral bypass, left femoral endarterectomy, attempted left iliac stent, placement of incisional VAC.   OT comments  Pt requiring min assist to stand from recliner and supervision for stand from toilet using grab bar.  Pt is not interested in 3 in 1 for over toilet at home.  Educated pt in use of AE for LB bathing and dressing.    Follow Up Recommendations  Home health OT;Supervision/Assistance - 24 hour    Equipment Recommendations  None recommended by OT    Recommendations for Other Services      Precautions / Restrictions Precautions Precautions: Fall Restrictions Weight Bearing Restrictions: No       Mobility Bed Mobility Pt up in chair  Transfers Overall transfer level: Needs assistance Equipment used: Rolling walker (2 wheeled) Transfers: Sit to/from Stand Sit to Stand: Min assist;Min guard         General transfer comment: verbal cues for hand placement    Balance Overall balance assessment: Needs assistance Sitting-balance support: No upper extremity supported;Feet supported Sitting balance-Leahy Scale: Good     Standing balance support: No upper extremity supported Standing balance-Leahy Scale: Fair                     ADL Overall ADL's : Needs assistance/impaired     Grooming: Supervision/safety;Wash/dry hands;Standing       Lower Body Bathing:  (instructed in use of long handled sponge for back and feet)       Lower Body Dressing: Minimal assistance;With adaptive equipment;Sit to/from stand  (reacher to donn mesh panties with pad, sock aide instruction)   Toilet Transfer: Supervision/safety;Ambulation;RW;Regular Toilet   Toileting- Water quality scientist and Hygiene: Supervision/safety;Sit to/from stand (managed panties and pericare)       Functional mobility during ADLs: Supervision/safety;Rolling walker General ADL Comments: Pt educated in use of AE for LB ADL and where she may purchase.      Vision                     Perception     Praxis      Cognition   Behavior During Therapy: WFL for tasks assessed/performed Overall Cognitive Status: Within Functional Limits for tasks assessed                       Extremity/Trunk Assessment               Exercises     Shoulder Instructions       General Comments      Pertinent Vitals/ Pain       Mild incisional pain, premedicated  Home Living                                          Prior Functioning/Environment              Frequency       Progress Toward Goals  OT Goals(current goals  can now be found in the care plan section)  Progress towards OT goals: Progressing toward goals     Plan Discharge plan remains appropriate    Co-evaluation                 End of Session     Activity Tolerance Patient tolerated treatment well   Patient Left in chair;with call bell/phone within reach;with family/visitor present   Nurse Communication  (pt had BM)        Time: 0802-2336 OT Time Calculation (min): 36 min  Charges: OT General Charges $OT Visit: 1 Procedure OT Treatments $Self Care/Home Management : 23-37 mins  Haze Boyden Lurlene Ronda 08/10/2013, 11:40 AM [phone]

## 2013-08-10 NOTE — Progress Notes (Signed)
Dicharge instruction given with patient and daughter Horris Latino. No question verbalized. No needs. Patient and daughter verbalizes understanding with using teach back.

## 2013-08-10 NOTE — Progress Notes (Signed)
Physical Therapy Treatment Patient Details Name: Natalie Porter MRN: [id number] DOB: [birth year] Today's Date: 08/31/2013    History of Present Illness Patient is a 73 yo female admitted 07/30/13 with SOB and edema.  Patient with CHF.  Was scheduled for LLE bypass graft on 5/11 - cancelled due to admit.  Patient with LLE pain and non-healing wounds LLE/foot.  Patient with h/o DM, PAD, and HTN.  On 08/07/13 pt underwent Right to left femoral-femoral bypass, left femoral endarterectomy, attempted left iliac stent, placement of incisional VAC.    PT Comments    Pt making excellent progress with mobility and should be able to return home with family.  Follow Up Recommendations  Home health PT;Supervision - Intermittent     Equipment Recommendations  None recommended by PT    Recommendations for Other Services       Precautions / Restrictions Precautions Precautions: Fall    Mobility  Bed Mobility Overal bed mobility: Needs Assistance Bed Mobility: Supine to Sit     Supine to sit: Min assist     General bed mobility comments: Assist to bring trunk up.  Transfers Overall transfer level: Needs assistance Equipment used: Rolling walker (2 wheeled) Transfers: Sit to/from Stand Sit to Stand: Min assist         General transfer comment: verbal cues for hand placement  Ambulation/Gait Ambulation/Gait assistance: Min guard Ambulation Distance (Feet): 120 Feet Assistive device: Rolling walker (2 wheeled) Gait Pattern/deviations: Step-through pattern;Decreased stride length Gait velocity: Decreased   General Gait Details: Pt with good use of walker.   Stairs            Wheelchair Mobility    Modified Rankin (Stroke Patients Only)       Balance Overall balance assessment: Needs assistance Sitting-balance support: No upper extremity supported;Feet supported Sitting balance-Leahy Scale: Good     Standing balance support: No upper extremity supported Standing  balance-Leahy Scale: Fair                      Cognition Arousal/Alertness: Awake/alert Behavior During Therapy: WFL for tasks assessed/performed Overall Cognitive Status: Within Functional Limits for tasks assessed                      Exercises      General Comments        Pertinent Vitals/Pain Pt reports incisional pain and requested pain meds.    Home Living                      Prior Function            PT Goals (current goals can now be found in the care plan section) Progress towards PT goals: Progressing toward goals    Frequency  Min 3X/week    PT Plan Current plan remains appropriate    Co-evaluation             End of Session Equipment Utilized During Treatment: Gait belt Activity Tolerance: Patient tolerated treatment well Patient left: in chair;with call bell/phone within reach;with family/visitor present     Time: 4098-1191 PT Time Calculation (min): 11 min  Charges:  $Gait Training: 8-22 mins                    G Codes:      Britt 08/31/2013, 11:35 AM  Suanne Marker PT [phone]

## 2013-08-10 NOTE — Progress Notes (Addendum)
  [facility] Progress Note  08/10/2013 7:40 AM 3 Days Post-Op  Subjective:  Feels good  Tm 101.1 now 98.3 VSS 92% CPAP  Filed Vitals:   08/10/13 0456  BP: 145/61  Pulse: 51  Temp: 98.3 F (36.8 C)  Resp: 18    Physical Exam: Incisions:  Bilateral groins with vacs in place with good seal. Extremities:  + doppler signal in the fem-fem bypass graft; + left doppler signal in AT/PT; + doppler signal right DP; rubor continues to improve on left foot.  Left calf ulcer improving also.  Left dorsum ulcer is drying up.  CBC    Component Value Date/Time   WBC 9.7 08/10/2013 0325   RBC 2.59* 08/10/2013 0325   RBC 3.19* 08/03/2013 1013   HGB 7.9* 08/10/2013 0325   HCT 24.7* 08/10/2013 0325   PLT 131* 08/10/2013 0325   MCV 95.4 08/10/2013 0325   MCH 30.5 08/10/2013 0325   MCHC 32.0 08/10/2013 0325   RDW 15.6* 08/10/2013 0325   LYMPHSABS 1.7 07/31/2013 0440   MONOABS 1.1* 07/31/2013 0440   EOSABS 0.3 07/31/2013 0440   BASOSABS 0.0 07/31/2013 0440    BMET    Component Value Date/Time   NA 136* 08/10/2013 0325   K 4.1 08/10/2013 0325   CL 99 08/10/2013 0325   CO2 26 08/10/2013 0325   GLUCOSE 193* 08/10/2013 0325   BUN 52* 08/10/2013 0325   CREATININE 1.55* 08/10/2013 0325   CALCIUM 7.6* 08/10/2013 0325   GFRNONAA 32* 08/10/2013 0325   GFRAA 37* 08/10/2013 0325    INR    Component Value Date/Time   INR 1.05 07/30/2013 1931     Intake/Output Summary (Last 24 hours) at 08/10/13 0740 Last data filed at 08/09/13 1816  Gross per 24 hour  Intake    600 ml  Output      0 ml  Net    600 ml     Assessment:  73 y.o. female is s/p:  Right to left femoral-femoral bypass, left femoral endarterectomy, attempted left iliac stent, placement of incisional VAC   3 Days Post-Op  Plan: -pt doing well this am-did not get out of bed and no one came to walk with the pt. -pt with Tm of 101.1 last pm, however, WBC is improved and she is afebrile.  U/A 4 days ago was negative. -Hgb  down slightly and pt is tolerating.  She has an EF of 65%. -continue IS every hour -needs to get out of bed.  Spoke with nurse this morning. -vacs off this morning as Dr. [place] Alar will inspect wound this am -BUN/Cr stable  -DVT prophylaxis:  Lovenox   Leontine Locket, PA-C [facility] [phone] 08/10/2013 7:40 AM    Some skin irritation from VAC sponge but overall groin incisions healing OK for d/c home today from my standpoint May take shower then blot dry incisions, dry gauze over incisions change 1-2 x daily Rx constipation today Follow up in 2 weeks  Ruta Hinds, MD [facility] [place]: [phone] Pager: [phone]

## 2013-08-11 ENCOUNTER — Encounter (HOSPITAL_COMMUNITY): Payer: Self-pay | Admitting: Emergency Medicine

## 2013-08-11 ENCOUNTER — Inpatient Hospital Stay (HOSPITAL_COMMUNITY)
Admission: EM | Admit: 2013-08-11 | Discharge: 2013-08-14 | DRG: 863 | Disposition: A | Discharge status: Home / Self Care | Payer: Medicare Other | Attending: Vascular Surgery | Admitting: Vascular Surgery

## 2013-08-11 ENCOUNTER — Telehealth: Payer: Self-pay

## 2013-08-11 DIAGNOSIS — T148XXA Other injury of unspecified body region, initial encounter: Secondary | ICD-10-CM

## 2013-08-11 DIAGNOSIS — L02219 Cutaneous abscess of trunk, unspecified: Secondary | ICD-10-CM | POA: Diagnosis present

## 2013-08-11 DIAGNOSIS — L97209 Non-pressure chronic ulcer of unspecified calf with unspecified severity: Secondary | ICD-10-CM | POA: Diagnosis present

## 2013-08-11 DIAGNOSIS — L98499 Non-pressure chronic ulcer of skin of other sites with unspecified severity: Secondary | ICD-10-CM | POA: Diagnosis present

## 2013-08-11 DIAGNOSIS — L97509 Non-pressure chronic ulcer of other part of unspecified foot with unspecified severity: Secondary | ICD-10-CM | POA: Diagnosis present

## 2013-08-11 DIAGNOSIS — Z9851 Tubal ligation status: Secondary | ICD-10-CM

## 2013-08-11 DIAGNOSIS — E119 Type 2 diabetes mellitus without complications: Secondary | ICD-10-CM | POA: Diagnosis present

## 2013-08-11 DIAGNOSIS — T8140XA Infection following a procedure, unspecified, initial encounter: Principal | ICD-10-CM | POA: Diagnosis present

## 2013-08-11 DIAGNOSIS — L03319 Cellulitis of trunk, unspecified: Secondary | ICD-10-CM

## 2013-08-11 DIAGNOSIS — I739 Peripheral vascular disease, unspecified: Secondary | ICD-10-CM | POA: Diagnosis present

## 2013-08-11 DIAGNOSIS — K219 Gastro-esophageal reflux disease without esophagitis: Secondary | ICD-10-CM | POA: Diagnosis present

## 2013-08-11 DIAGNOSIS — I872 Venous insufficiency (chronic) (peripheral): Secondary | ICD-10-CM | POA: Diagnosis not present

## 2013-08-11 DIAGNOSIS — J449 Chronic obstructive pulmonary disease, unspecified: Secondary | ICD-10-CM | POA: Diagnosis not present

## 2013-08-11 DIAGNOSIS — Y832 Surgical operation with anastomosis, bypass or graft as the cause of abnormal reaction of the patient, or of later complication, without mention of misadventure at the time of the procedure: Secondary | ICD-10-CM | POA: Diagnosis present

## 2013-08-11 DIAGNOSIS — Z794 Long term (current) use of insulin: Secondary | ICD-10-CM

## 2013-08-11 DIAGNOSIS — J4489 Other specified chronic obstructive pulmonary disease: Secondary | ICD-10-CM | POA: Diagnosis present

## 2013-08-11 DIAGNOSIS — I10 Essential (primary) hypertension: Secondary | ICD-10-CM | POA: Diagnosis not present

## 2013-08-11 DIAGNOSIS — Z87891 Personal history of nicotine dependence: Secondary | ICD-10-CM

## 2013-08-11 DIAGNOSIS — Z79899 Other long term (current) drug therapy: Secondary | ICD-10-CM | POA: Diagnosis not present

## 2013-08-11 DIAGNOSIS — Z86718 Personal history of other venous thrombosis and embolism: Secondary | ICD-10-CM | POA: Diagnosis not present

## 2013-08-11 DIAGNOSIS — L089 Local infection of the skin and subcutaneous tissue, unspecified: Secondary | ICD-10-CM

## 2013-08-11 DIAGNOSIS — Z95828 Presence of other vascular implants and grafts: Secondary | ICD-10-CM

## 2013-08-11 DIAGNOSIS — L97809 Non-pressure chronic ulcer of other part of unspecified lower leg with unspecified severity: Secondary | ICD-10-CM | POA: Diagnosis not present

## 2013-08-11 DIAGNOSIS — T8149XA Infection following a procedure, other surgical site, initial encounter: Secondary | ICD-10-CM | POA: Diagnosis present

## 2013-08-11 DIAGNOSIS — Z48812 Encounter for surgical aftercare following surgery on the circulatory system: Secondary | ICD-10-CM | POA: Diagnosis not present

## 2013-08-11 LAB — CBC WITH DIFFERENTIAL/PLATELET
BASOS ABS: 0 10*3/uL (ref 0.0–0.1)
Basophils Relative: 0 % (ref 0–1)
Eosinophils Absolute: 0.4 10*3/uL (ref 0.0–0.7)
Eosinophils Relative: 5 % (ref 0–5)
HCT: 28.4 % — ABNORMAL LOW (ref 36.0–46.0)
Hemoglobin: 9.2 g/dL — ABNORMAL LOW (ref 12.0–15.0)
Lymphocytes Relative: 18 % (ref 12–46)
Lymphs Abs: 1.4 10*3/uL (ref 0.7–4.0)
MCH: 30.3 pg (ref 26.0–34.0)
MCHC: 32.4 g/dL (ref 30.0–36.0)
MCV: 93.4 fL (ref 78.0–100.0)
MONO ABS: 1 10*3/uL (ref 0.1–1.0)
Monocytes Relative: 13 % — ABNORMAL HIGH (ref 3–12)
NEUTROS ABS: 4.7 10*3/uL (ref 1.7–7.7)
NEUTROS PCT: 64 % (ref 43–77)
Platelets: 202 10*3/uL (ref 150–400)
RBC: 3.04 MIL/uL — ABNORMAL LOW (ref 3.87–5.11)
RDW: 15.1 % (ref 11.5–15.5)
WBC: 7.4 10*3/uL (ref 4.0–10.5)

## 2013-08-11 LAB — BASIC METABOLIC PANEL
BUN: 56 mg/dL — ABNORMAL HIGH (ref 6–23)
CO2: 25 mEq/L (ref 19–32)
CREATININE: 1.25 mg/dL — AB (ref 0.50–1.10)
Calcium: 8.3 mg/dL — ABNORMAL LOW (ref 8.4–10.5)
Chloride: 101 mEq/L (ref 96–112)
GFR calc non Af Amer: 42 mL/min — ABNORMAL LOW (ref >90)
GFR, EST AFRICAN AMERICAN: 48 mL/min — AB (ref >90)
Glucose, Bld: 201 mg/dL — ABNORMAL HIGH (ref 70–99)
POTASSIUM: 4 meq/L (ref 3.7–5.3)
Sodium: 139 mEq/L (ref 137–147)

## 2013-08-11 LAB — I-STAT CG4 LACTIC ACID, ED: LACTIC ACID, VENOUS: 0.81 mmol/L (ref 0.5–2.2)

## 2013-08-11 MED ORDER — ENOXAPARIN SODIUM 40 MG/0.4ML ~~LOC~~ SOLN
40.0000 mg | SUBCUTANEOUS | Status: DC
  Administered 2013-08-12 – 2013-08-14 (×3): 40 mg via SUBCUTANEOUS
  Filled 2013-08-11 (×4): qty 0.4

## 2013-08-11 MED ORDER — ACETAMINOPHEN 500 MG PO TABS: 500.0000 mg | ORAL_TABLET | Freq: Four times a day (QID) | ORAL | Status: DC | PRN

## 2013-08-11 MED ORDER — METOPROLOL TARTRATE 1 MG/ML IV SOLN: 2.0000 mg | INTRAVENOUS | Status: DC | PRN

## 2013-08-11 MED ORDER — TIOTROPIUM BROMIDE MONOHYDRATE 18 MCG IN CAPS
18.0000 ug | ORAL_CAPSULE | Freq: Every day | RESPIRATORY_TRACT | Status: DC
  Administered 2013-08-13 – 2013-08-14 (×2): 18 ug via RESPIRATORY_TRACT
  Filled 2013-08-11: qty 5

## 2013-08-11 MED ORDER — INSULIN GLARGINE 100 UNIT/ML ~~LOC~~ SOLN
13.0000 [IU] | Freq: Every day | SUBCUTANEOUS | Status: DC
  Administered 2013-08-12 – 2013-08-13 (×3): 13 [IU] via SUBCUTANEOUS
  Filled 2013-08-11 (×4): qty 0.13

## 2013-08-11 MED ORDER — GABAPENTIN 100 MG PO CAPS
200.0000 mg | ORAL_CAPSULE | Freq: Every day | ORAL | Status: DC
  Administered 2013-08-12 – 2013-08-13 (×3): 200 mg via ORAL
  Filled 2013-08-11 (×4): qty 2

## 2013-08-11 MED ORDER — AMLODIPINE BESYLATE 10 MG PO TABS
10.0000 mg | ORAL_TABLET | Freq: Every day | ORAL | Status: DC
  Administered 2013-08-12 – 2013-08-14 (×3): 10 mg via ORAL
  Filled 2013-08-11 (×3): qty 1

## 2013-08-11 MED ORDER — HYDRALAZINE HCL 20 MG/ML IJ SOLN: 10.0000 mg | INTRAMUSCULAR | Status: DC | PRN

## 2013-08-11 MED ORDER — ATORVASTATIN CALCIUM 80 MG PO TABS
80.0000 mg | ORAL_TABLET | Freq: Every day | ORAL | Status: DC
  Administered 2013-08-12 – 2013-08-13 (×2): 80 mg via ORAL
  Filled 2013-08-11 (×3): qty 1

## 2013-08-11 MED ORDER — PIPERACILLIN-TAZOBACTAM 3.375 G IVPB
3.3750 g | Freq: Three times a day (TID) | INTRAVENOUS | Status: DC
  Administered 2013-08-12 – 2013-08-14 (×8): 3.375 g via INTRAVENOUS
  Filled 2013-08-11 (×9): qty 50

## 2013-08-11 MED ORDER — HYDROCODONE-ACETAMINOPHEN 10-325 MG PO TABS
1.0000 | ORAL_TABLET | Freq: Four times a day (QID) | ORAL | Status: DC | PRN
  Administered 2013-08-12: 1 via ORAL
  Filled 2013-08-11: qty 1

## 2013-08-11 MED ORDER — GUAIFENESIN-DM 100-10 MG/5ML PO SYRP: 15.0000 mL | ORAL_SOLUTION | ORAL | Status: DC | PRN

## 2013-08-11 MED ORDER — ONDANSETRON HCL 4 MG/2ML IJ SOLN: 4.0000 mg | Freq: Four times a day (QID) | INTRAMUSCULAR | Status: DC | PRN

## 2013-08-11 MED ORDER — PHENOL 1.4 % MT LIQD
1.0000 | OROMUCOSAL | Status: DC | PRN
  Filled 2013-08-11: qty 177

## 2013-08-11 MED ORDER — CARVEDILOL 25 MG PO TABS
25.0000 mg | ORAL_TABLET | Freq: Two times a day (BID) | ORAL | Status: DC
  Administered 2013-08-12 – 2013-08-14 (×5): 25 mg via ORAL
  Filled 2013-08-11 (×7): qty 1

## 2013-08-11 MED ORDER — POTASSIUM CHLORIDE CRYS ER 20 MEQ PO TBCR
20.0000 meq | EXTENDED_RELEASE_TABLET | Freq: Once | ORAL | Status: AC
  Administered 2013-08-12: 20 meq via ORAL
  Filled 2013-08-11: qty 1

## 2013-08-11 MED ORDER — SODIUM CHLORIDE 0.9 % IV SOLN: 250.0000 mL | INTRAVENOUS | Status: DC | PRN

## 2013-08-11 MED ORDER — GLIPIZIDE ER 10 MG PO TB24
10.0000 mg | ORAL_TABLET | Freq: Every day | ORAL | Status: DC
  Administered 2013-08-12 – 2013-08-14 (×3): 10 mg via ORAL
  Filled 2013-08-11 (×4): qty 1

## 2013-08-11 MED ORDER — LISINOPRIL 5 MG PO TABS
5.0000 mg | ORAL_TABLET | Freq: Every day | ORAL | Status: DC
  Administered 2013-08-12 – 2013-08-14 (×3): 5 mg via ORAL
  Filled 2013-08-11 (×3): qty 1

## 2013-08-11 MED ORDER — PANTOPRAZOLE SODIUM 40 MG PO TBEC
40.0000 mg | DELAYED_RELEASE_TABLET | Freq: Every day | ORAL | Status: DC
  Administered 2013-08-12 – 2013-08-14 (×3): 40 mg via ORAL
  Filled 2013-08-11 (×3): qty 1

## 2013-08-11 MED ORDER — ALBUTEROL SULFATE HFA 108 (90 BASE) MCG/ACT IN AERS: 2.0000 | INHALATION_SPRAY | Freq: Four times a day (QID) | RESPIRATORY_TRACT | Status: DC | PRN

## 2013-08-11 MED ORDER — LABETALOL HCL 5 MG/ML IV SOLN
10.0000 mg | INTRAVENOUS | Status: DC | PRN
  Filled 2013-08-11: qty 4

## 2013-08-11 MED ORDER — FUROSEMIDE 20 MG PO TABS
20.0000 mg | ORAL_TABLET | Freq: Every day | ORAL | Status: DC
  Administered 2013-08-12 – 2013-08-14 (×3): 20 mg via ORAL
  Filled 2013-08-11 (×3): qty 1

## 2013-08-11 MED ORDER — PIOGLITAZONE HCL 45 MG PO TABS
45.0000 mg | ORAL_TABLET | Freq: Every day | ORAL | Status: DC
  Administered 2013-08-12 – 2013-08-14 (×3): 45 mg via ORAL
  Filled 2013-08-11 (×3): qty 1

## 2013-08-11 MED ORDER — INSULIN ASPART 100 UNIT/ML ~~LOC~~ SOLN
0.0000 [IU] | Freq: Three times a day (TID) | SUBCUTANEOUS | Status: DC
  Administered 2013-08-12 (×2): 2 [IU] via SUBCUTANEOUS
  Administered 2013-08-13: 3 [IU] via SUBCUTANEOUS
  Administered 2013-08-13: 2 [IU] via SUBCUTANEOUS
  Administered 2013-08-13: 5 [IU] via SUBCUTANEOUS

## 2013-08-11 MED ORDER — ESCITALOPRAM OXALATE 20 MG PO TABS
20.0000 mg | ORAL_TABLET | Freq: Every day | ORAL | Status: DC
  Administered 2013-08-12 – 2013-08-14 (×3): 20 mg via ORAL
  Filled 2013-08-11 (×3): qty 1

## 2013-08-11 MED ORDER — CALCIUM CARBONATE-VITAMIN D 500-200 MG-UNIT PO TABS
1.0000 | ORAL_TABLET | Freq: Every day | ORAL | Status: DC
  Administered 2013-08-12 – 2013-08-14 (×3): 1 via ORAL
  Filled 2013-08-11 (×3): qty 1

## 2013-08-11 MED ORDER — CHLORTHALIDONE 50 MG PO TABS
50.0000 mg | ORAL_TABLET | Freq: Every day | ORAL | Status: DC
  Administered 2013-08-12 – 2013-08-14 (×3): 50 mg via ORAL
  Filled 2013-08-11 (×3): qty 1

## 2013-08-11 NOTE — ED Provider Notes (Signed)
CSN: [id number]     Arrival date & time 08/11/13  1750 History   First MD Initiated Contact with Patient 08/11/13 2231     Chief Complaint  Patient presents with  . Wound Infection     (Consider location/radiation/quality/duration/timing/severity/associated sxs/prior Treatment) HPI Comments: Patient is a 73 year old female with history of peripheral vascular disease. She underwent bypass surgery to both femoral arteries earlier this week. She was discharged yesterday and returns today with increased pain, redness, and purulent discharge. She reports fever at home to 101. Pain is worse with ambulation and palpation and relieved with rest. She spoke with Dr. Trula Slade recommended she come to the ER to be evaluated.  The history is provided by the patient.    Past Medical History  Diagnosis Date  . Diabetes mellitus without complication   . Hypertension   . Renal disorder   . Gallstone   . Arthritis   . DVT (deep venous thrombosis)     2011  . COPD (chronic obstructive pulmonary disease)   . Pneumonia 2014  . Neuropathy   . GERD (gastroesophageal reflux disease)   . Constipation   . Anemia    Past Surgical History  Procedure Laterality Date  . Tubal ligation    . Carpal tunnel release Bilateral   . Eye surgery Bilateral     cataract  . Foot surgery Bilateral     bone spurs, and repair  . Carotid endarterectomy Right 1990  . Breast biopsy Left   . Lung biopsy      non cancerous  . Colonoscopy w/ polypectomy    . Femoral-femoral bypass graft  08/07/2013    Procedure: BYPASS GRAFT FEMORAL-FEMORAL ARTERY USING 8MM X 30CM HEMASHIELD GRAFT; LEFT ILIAC ANGIOGRAM; ATTEMPTED LEFT ILIAC ARTERY STENT GRAFT;  Surgeon: Elam Dutch, MD;  Location: [facility];  Service: Vascular;;  . Application of wound vac Bilateral 08/07/2013    Procedure: APPLICATION OF INCISIONAL WOUND [place];  Surgeon: Elam Dutch, MD;  Location: [facility];  Service: Vascular;  Laterality: Bilateral;  . Endarterectomy  femoral Left 08/07/2013    Procedure: ENDARTERECTOMY FEMORAL;  Surgeon: Elam Dutch, MD;  Location: [facility];  Service: Vascular;  Laterality: Left;   History reviewed. No pertinent family history. History  Substance Use Topics  . Smoking status: Former Smoker -- 34 years  . Smokeless tobacco: Not on file  . Alcohol Use: No   OB History   Grav Para Term Preterm Abortions TAB SAB Ect Mult Living                 Review of Systems  All other systems reviewed and are negative.     Allergies  Codeine and Morphine and related  Home Medications   Prior to Admission medications   Medication Sig Start Date End Date Taking? Authorizing Provider  acetaminophen (TYLENOL) 500 MG tablet Take 500 mg by mouth every 6 (six) hours as needed.   Yes Historical Provider, MD  Aclidinium Bromide (TUDORZA PRESSAIR) 400 MCG/ACT AEPB Inhale 1 puff into the lungs every 12 (twelve) hours.   Yes Historical Provider, MD  albuterol (PROVENTIL HFA;VENTOLIN HFA) 108 (90 BASE) MCG/ACT inhaler Inhale 2 puffs into the lungs every 6 (six) hours as needed for wheezing or shortness of breath.   Yes Historical Provider, MD  amLODipine (NORVASC) 10 MG tablet Take 10 mg by mouth daily.   Yes Historical Provider, MD  carvedilol (COREG) 25 MG tablet Take 25 mg by mouth 2 (two) times  daily with a meal.   Yes Historical Provider, MD  chlorthalidone (HYGROTON) 50 MG tablet Take 50 mg by mouth daily.   Yes Historical Provider, MD  escitalopram (LEXAPRO) 20 MG tablet Take 20 mg by mouth daily.   Yes Historical Provider, MD  furosemide (LASIX) 20 MG tablet Take 1 tablet (20 mg total) by mouth daily. 08/10/13  Yes Erline Hau, MD  gabapentin (NEURONTIN) 100 MG capsule Take 100 mg by mouth 2 (two) times daily. 100 mg in the morning and 200 mg at bedtime   Yes Historical Provider, MD  glipiZIDE (GLUCOTROL XL) 5 MG 24 hr tablet Take 10 mg by mouth daily with breakfast.   Yes Historical Provider, MD   HYDROcodone-acetaminophen (NORCO) 10-325 MG per tablet Take 1 tablet by mouth every 6 (six) hours as needed. 08/10/13  Yes Samantha J Rhyne, PA-C  insulin glargine (LANTUS) 100 UNIT/ML injection Inject 13 Units into the skin at bedtime.   Yes Historical Provider, MD  lisinopril (PRINIVIL,ZESTRIL) 5 MG tablet Take 5 mg by mouth daily.   Yes Historical Provider, MD  pioglitazone (ACTOS) 45 MG tablet Take 45 mg by mouth daily.   Yes Historical Provider, MD  rosuvastatin (CRESTOR) 40 MG tablet Take 40 mg by mouth daily.   Yes Historical Provider, MD  calcium-vitamin D (OSCAL) 250-125 MG-UNIT per tablet Take 1 tablet by mouth 2 (two) times daily.    Historical Provider, MD   BP 122/65  Pulse 62  Temp(Src) 99.8 F (37.7 C) (Oral)  Resp 16  SpO2 96% Physical Exam  Nursing note and vitals reviewed. Constitutional: She is oriented to person, place, and time. She appears well-developed and well-nourished. No distress.  HENT:  Head: Normocephalic and atraumatic.  Neck: Normal range of motion. Neck supple.  Cardiovascular: Normal rate and regular rhythm.  Exam reveals no gallop and no friction rub.   No murmur heard. Pulmonary/Chest: Effort normal and breath sounds normal. No respiratory distress. She has no wheezes.  Abdominal: Soft. Bowel sounds are normal. She exhibits no distension. There is no tenderness.  Musculoskeletal: Normal range of motion.  Neurological: She is alert and oriented to person, place, and time.  Skin: Skin is warm and dry. She is not diaphoretic.  There are surgical incisions to both groins. Both appear mildly ear edematous with slight greenish drainage. There is no obvious abscess.    ED Course  Procedures (including critical care time) Labs Review Labs Reviewed  CBC WITH DIFFERENTIAL - Abnormal; Notable for the following:    RBC 3.04 (*)    Hemoglobin 9.2 (*)    HCT 28.4 (*)    Monocytes Relative 13 (*)    All other components within normal limits  BASIC  METABOLIC PANEL - Abnormal; Notable for the following:    Glucose, Bld 201 (*)    BUN 56 (*)    Creatinine, Ser 1.25 (*)    Calcium 8.3 (*)    GFR calc non Af Amer 42 (*)    GFR calc Af Amer 48 (*)    All other components within normal limits  I-STAT CG4 LACTIC ACID, ED    Imaging Review No results found.   EKG Interpretation None      MDM   Final diagnoses:  None    Patient presents with what appears to be a mild cellulitis at the site of her femoral bypass incisions. I've spoken with Dr. Trula Slade who will evaluate the patient in the ER. The final disposition will  be left up to him.    Veryl Speak, MD 08/11/13 2337

## 2013-08-11 NOTE — Telephone Encounter (Signed)
Phone call from [facility] RN with report that pt. was admitted to Home care today, and has green drainage from (L) groin incision, and redness/ inflammation in (R) groin incisional area.  Reported fever of 99.8.  Discussed w/ Dr. Bridgett Larsson.  Recommended to go to the ER.  Notified Brandi, [facility] RN, and pt. of Dr. Lianne Moris recommendation. Pt. Verbalized understanding.

## 2013-08-11 NOTE — H&P (Signed)
Consult Note  Patient name: Natalie Porter MRN: [id number] DOB: 1940-10-12 Sex: female  Consulting Physician:  ER  Reason for Consult:  Chief Complaint  Patient presents with  . Wound Infection    HISTORY OF PRESENT ILLNESS: 73 year old female recently s/p right common iliac stent followed by right to left femoral femoral bypass for a non-healing left foot wound.  She was discharged yesterday.  HH called today with concerns over her groin incisions and she was instructed to goto the ER  Past Medical History  Diagnosis Date  . Diabetes mellitus without complication   . Hypertension   . Renal disorder   . Gallstone   . Arthritis   . DVT (deep venous thrombosis)     2011  . COPD (chronic obstructive pulmonary disease)   . Pneumonia 2014  . Neuropathy   . GERD (gastroesophageal reflux disease)   . Constipation   . Anemia     Past Surgical History  Procedure Laterality Date  . Tubal ligation    . Carpal tunnel release Bilateral   . Eye surgery Bilateral     cataract  . Foot surgery Bilateral     bone spurs, and repair  . Carotid endarterectomy Right 1990  . Breast biopsy Left   . Lung biopsy      non cancerous  . Colonoscopy w/ polypectomy    . Femoral-femoral bypass graft  08/07/2013    Procedure: BYPASS GRAFT FEMORAL-FEMORAL ARTERY USING 8MM X 30CM HEMASHIELD GRAFT; LEFT ILIAC ANGIOGRAM; ATTEMPTED LEFT ILIAC ARTERY STENT GRAFT;  Surgeon: Elam Dutch, MD;  Location: [facility];  Service: Vascular;;  . Application of wound vac Bilateral 08/07/2013    Procedure: APPLICATION OF INCISIONAL WOUND [place];  Surgeon: Elam Dutch, MD;  Location: [facility];  Service: Vascular;  Laterality: Bilateral;  . Endarterectomy femoral Left 08/07/2013    Procedure: ENDARTERECTOMY FEMORAL;  Surgeon: Elam Dutch, MD;  Location: [facility];  Service: Vascular;  Laterality: Left;    History   Social History  . Marital Status: Widowed    Spouse Name: N/A    Number of Children: N/A    . Years of Education: N/A   Occupational History  . Not on file.   Social History Main Topics  . Smoking status: Former Smoker -- 34 years  . Smokeless tobacco: Not on file  . Alcohol Use: No  . Drug Use: No  . Sexual Activity: Not on file     Comment: quit 1990   Other Topics Concern  . Not on file   Social History Narrative  . No narrative on file    History reviewed. No pertinent family history.  Allergies as of 08/11/2013 - Review Complete 08/11/2013  Allergen Reaction Noted  . Codeine Nausea Only 07/21/2013  . Morphine and related Nausea And Vomiting 07/21/2013    No current facility-administered medications on file prior to encounter.   Current Outpatient Prescriptions on File Prior to Encounter  Medication Sig Dispense Refill  . acetaminophen (TYLENOL) 500 MG tablet Take 500 mg by mouth every 6 (six) hours as needed.      . Aclidinium Bromide (TUDORZA PRESSAIR) 400 MCG/ACT AEPB Inhale 1 puff into the lungs every 12 (twelve) hours.      Marland Kitchen albuterol (PROVENTIL HFA;VENTOLIN HFA) 108 (90 BASE) MCG/ACT inhaler Inhale 2 puffs into the lungs every 6 (six) hours as needed for wheezing or shortness of breath.      Marland Kitchen  amLODipine (NORVASC) 10 MG tablet Take 10 mg by mouth daily.      . carvedilol (COREG) 25 MG tablet Take 25 mg by mouth 2 (two) times daily with a meal.      . chlorthalidone (HYGROTON) 50 MG tablet Take 50 mg by mouth daily.      Marland Kitchen escitalopram (LEXAPRO) 20 MG tablet Take 20 mg by mouth daily.      . furosemide (LASIX) 20 MG tablet Take 1 tablet (20 mg total) by mouth daily.  30 tablet  1  . gabapentin (NEURONTIN) 100 MG capsule Take 100 mg by mouth 2 (two) times daily. 100 mg in the morning and 200 mg at bedtime      . glipiZIDE (GLUCOTROL XL) 5 MG 24 hr tablet Take 10 mg by mouth daily with breakfast.      . HYDROcodone-acetaminophen (NORCO) 10-325 MG per tablet Take 1 tablet by mouth every 6 (six) hours as needed.  30 tablet  0  . insulin glargine (LANTUS)  100 UNIT/ML injection Inject 13 Units into the skin at bedtime.      . pioglitazone (ACTOS) 45 MG tablet Take 45 mg by mouth daily.      . rosuvastatin (CRESTOR) 40 MG tablet Take 40 mg by mouth daily.      . calcium-vitamin D (OSCAL) 250-125 MG-UNIT per tablet Take 1 tablet by mouth 2 (two) times daily.         REVIEW OF SYSTEMS: Up walking at home, subjective fevers, normal bowel function, leg swelling  PHYSICAL EXAMINATION: General: The patient appears their stated age.  Vital signs are BP 135/39  Pulse 50  Temp(Src) 98.1 F (36.7 C) (Oral)  Resp 16  SpO2 98% Pulmonary: Respirations are non-labored HEENT:  No gross abnormalities Abdomen: Soft and non-tender  Musculoskeletal: There are no major deformities.   Neurologic: No focal weakness or paresthesias are detected, Skin: left leg ulcers healing, erythema around both groin incisions with min drainage Psychiatric: The patient has normal affect. Cardiovascular: There is a regular rate and rhythm without significant murmur appreciated.    Assessment:  PVD with Ulcer, s/p revascularization Plan: Patient has bilateral superficial wound infections.  With a recent prosthetic graft, I feel she needs to be admitted for IV abx.  Continue with diabetes control Up ad lib Monitor O2 given COPD Monitor Cr given h/o renal disease and now on abx     V. Leia Alf, M.D. [facility] [place] Office: [phone] Pager:  [phone]

## 2013-08-11 NOTE — ED Notes (Signed)
Dr. Stark Jock at bedside. Pt given pillow, multiple blankets, propped feet up and repositioned.

## 2013-08-11 NOTE — ED Notes (Addendum)
Presents post atererial bypass surgery and stent placement in legs, now having green drainage from incision site.  Denies pain. Reports low grade fevers since surgery 101 fever yesterday. She also reports left leg swelling.  She was discharged yesterday.

## 2013-08-12 LAB — CREATININE, SERUM
CREATININE: 1.13 mg/dL — AB (ref 0.50–1.10)
GFR calc non Af Amer: 47 mL/min — ABNORMAL LOW (ref >90)
GFR, EST AFRICAN AMERICAN: 54 mL/min — AB (ref >90)

## 2013-08-12 LAB — GLUCOSE, CAPILLARY
GLUCOSE-CAPILLARY: 141 mg/dL — AB (ref 70–99)
GLUCOSE-CAPILLARY: 174 mg/dL — AB (ref 70–99)
Glucose-Capillary: 108 mg/dL — ABNORMAL HIGH (ref 70–99)
Glucose-Capillary: 144 mg/dL — ABNORMAL HIGH (ref 70–99)

## 2013-08-12 LAB — COMPREHENSIVE METABOLIC PANEL
ALT: 12 U/L (ref 0–35)
AST: 14 U/L (ref 0–37)
Albumin: 2 g/dL — ABNORMAL LOW (ref 3.5–5.2)
Alkaline Phosphatase: 203 U/L — ABNORMAL HIGH (ref 39–117)
BUN: 46 mg/dL — AB (ref 6–23)
CALCIUM: 7.8 mg/dL — AB (ref 8.4–10.5)
CO2: 24 meq/L (ref 19–32)
CREATININE: 1.04 mg/dL (ref 0.50–1.10)
Chloride: 108 mEq/L (ref 96–112)
GFR calc Af Amer: 60 mL/min — ABNORMAL LOW (ref >90)
GFR, EST NON AFRICAN AMERICAN: 52 mL/min — AB (ref >90)
GLUCOSE: 123 mg/dL — AB (ref 70–99)
Potassium: 3.9 mEq/L (ref 3.7–5.3)
Sodium: 143 mEq/L (ref 137–147)
Total Bilirubin: 0.2 mg/dL — ABNORMAL LOW (ref 0.3–1.2)
Total Protein: 6.1 g/dL (ref 6.0–8.3)

## 2013-08-12 LAB — PROTIME-INR
INR: 1.15 (ref 0.00–1.49)
PROTHROMBIN TIME: 14.5 s (ref 11.6–15.2)

## 2013-08-12 LAB — URINE MICROSCOPIC-ADD ON

## 2013-08-12 LAB — URINALYSIS, ROUTINE W REFLEX MICROSCOPIC
BILIRUBIN URINE: NEGATIVE
Glucose, UA: 100 mg/dL — AB
Hgb urine dipstick: NEGATIVE
Ketones, ur: NEGATIVE mg/dL
Leukocytes, UA: NEGATIVE
Nitrite: NEGATIVE
Protein, ur: 100 mg/dL — AB
SPECIFIC GRAVITY, URINE: 1.011 (ref 1.005–1.030)
UROBILINOGEN UA: 1 mg/dL (ref 0.0–1.0)
pH: 7 (ref 5.0–8.0)

## 2013-08-12 LAB — CBC
HCT: 26.5 % — ABNORMAL LOW (ref 36.0–46.0)
HEMOGLOBIN: 8.5 g/dL — AB (ref 12.0–15.0)
MCH: 29.9 pg (ref 26.0–34.0)
MCHC: 32.1 g/dL (ref 30.0–36.0)
MCV: 93.3 fL (ref 78.0–100.0)
Platelets: 186 10*3/uL (ref 150–400)
RBC: 2.84 MIL/uL — AB (ref 3.87–5.11)
RDW: 15 % (ref 11.5–15.5)
WBC: 7.1 10*3/uL (ref 4.0–10.5)

## 2013-08-12 MED ORDER — SODIUM CHLORIDE 0.9 % IJ SOLN
3.0000 mL | Freq: Two times a day (BID) | INTRAMUSCULAR | Status: DC
  Administered 2013-08-12 – 2013-08-13 (×2): 3 mL via INTRAVENOUS

## 2013-08-12 MED ORDER — ALBUTEROL SULFATE (2.5 MG/3ML) 0.083% IN NEBU: 2.5000 mg | INHALATION_SOLUTION | Freq: Four times a day (QID) | RESPIRATORY_TRACT | Status: DC | PRN

## 2013-08-12 MED ORDER — SODIUM CHLORIDE 0.9 % IJ SOLN: 3.0000 mL | INTRAMUSCULAR | Status: DC | PRN

## 2013-08-12 MED ORDER — VANCOMYCIN HCL 10 G IV SOLR
1500.0000 mg | Freq: Once | INTRAVENOUS | Status: AC
  Administered 2013-08-12: 1500 mg via INTRAVENOUS
  Filled 2013-08-12: qty 1500

## 2013-08-12 MED ORDER — GABAPENTIN 100 MG PO CAPS
100.0000 mg | ORAL_CAPSULE | Freq: Every day | ORAL | Status: DC
  Administered 2013-08-12 – 2013-08-14 (×3): 100 mg via ORAL
  Filled 2013-08-12 (×3): qty 1

## 2013-08-12 MED ORDER — VANCOMYCIN HCL IN DEXTROSE 1-5 GM/200ML-% IV SOLN
1000.0000 mg | INTRAVENOUS | Status: DC
  Administered 2013-08-13 – 2013-08-14 (×2): 1000 mg via INTRAVENOUS
  Filled 2013-08-12 (×2): qty 200

## 2013-08-12 MED ORDER — ENOXAPARIN SODIUM 40 MG/0.4ML ~~LOC~~ SOLN
40.0000 mg | SUBCUTANEOUS | Status: DC
  Filled 2013-08-12: qty 0.4

## 2013-08-12 MED ORDER — ONDANSETRON HCL 4 MG/2ML IJ SOLN: 4.0000 mg | Freq: Four times a day (QID) | INTRAMUSCULAR | Status: DC | PRN

## 2013-08-12 NOTE — Progress Notes (Signed)
    Subjective  -   The patient was admitted last night for bilateral groin infection.  She has had no complaints overnight.   Physical Exam:  Her inguinal incision have less erythema today.       Assessment/Plan:  Status post right iliac stent and a right to left femoral-femoral bypass graft for left foot ulcer  The patient's condition has improved overnight with IV antibiotics.  She will continue to receive IV antibiotics and local wound care.  Natalie Porter 08/12/2013 10:17 AM --  Danley Danker Vitals:   08/12/13 0551  BP: 130/36  Pulse: 57  Temp: 98.6 F (37 C)  Resp: 18    Intake/Output Summary (Last 24 hours) at 08/12/13 1017 Last data filed at 08/12/13 2620  Gross per 24 hour  Intake    340 ml  Output    400 ml  Net    -60 ml     Laboratory CBC    Component Value Date/Time   WBC 7.1 08/12/2013 0028   HGB 8.5* 08/12/2013 0028   HCT 26.5* 08/12/2013 0028   PLT 186 08/12/2013 0028    BMET    Component Value Date/Time   NA 143 08/12/2013 0508   K 3.9 08/12/2013 0508   CL 108 08/12/2013 0508   CO2 24 08/12/2013 0508   GLUCOSE 123* 08/12/2013 0508   BUN 46* 08/12/2013 0508   CREATININE 1.04 08/12/2013 0508   CALCIUM 7.8* 08/12/2013 0508   GFRNONAA 52* 08/12/2013 0508   GFRAA 60* 08/12/2013 0508    COAG Lab Results  Component Value Date   INR 1.15 08/12/2013   INR 1.05 07/30/2013   INR 1.22 07/27/2013   No results found for this basename: PTT    Antibiotics Anti-infectives   Start     Dose/Rate Route Frequency Ordered Stop   08/13/13 0000  vancomycin (VANCOCIN) IVPB 1000 mg/200 mL premix     1,000 mg 200 mL/hr over 60 Minutes Intravenous Every 24 hours 08/12/13 0021     08/12/13 0030  vancomycin (VANCOCIN) 1,500 mg in sodium chloride 0.9 % 500 mL IVPB     1,500 mg 250 mL/hr over 120 Minutes Intravenous  Once 08/12/13 0021 08/12/13 0306   08/12/13 0000  piperacillin-tazobactam (ZOSYN) IVPB 3.375 g     3.375 g 12.5 mL/hr over 240 Minutes Intravenous 3  times per day 08/11/13 2353         V. Leia Alf, M.D. [facility] [place] Office: [phone] Pager:  [phone]

## 2013-08-12 NOTE — Progress Notes (Addendum)
ANTIBIOTIC CONSULT NOTE - INITIAL  Pharmacy Consult for vancomycin  Indication: wound infection   Allergies  Allergen Reactions  . Codeine Nausea Only  . Morphine And Related Nausea And Vomiting    Patient Measurements:   Adjusted Body Weight:   Vital Signs: Temp: 99.8 F (37.7 C) (05/22 2233) Temp src: Oral (05/22 2233) BP: 122/65 mmHg (05/22 2233) Pulse Rate: 62 (05/22 2233) Intake/Output from previous day:   Intake/Output from this shift:    Labs:  Recent Labs  08/09/13 0415 08/10/13 0325 08/11/13 1839  WBC 12.5* 9.7 7.4  HGB 8.6* 7.9* 9.2*  PLT 134* 131* 202  CREATININE 1.50* 1.55* 1.25*   The CrCl is unknown because both a height and weight (above a minimum accepted value) are required for this calculation. No results found for this basename: VANCOTROUGH, Corlis Leak, VANCORANDOM, GENTTROUGH, GENTPEAK, GENTRANDOM, TOBRATROUGH, TOBRAPEAK, TOBRARND, AMIKACINPEAK, AMIKACINTROU, AMIKACIN,  in the last 72 hours   Microbiology: Recent Results (from the past 720 hour(s))  SURGICAL PCR SCREEN     Status: None   Collection Time    08/05/13 11:23 PM      Result Value Ref Range Status   MRSA, PCR NEGATIVE  NEGATIVE Final   Staphylococcus aureus NEGATIVE  NEGATIVE Final   Comment:            The Xpert SA Assay (FDA     approved for NASAL specimens     in patients over 4 years of age),     is one component of     a comprehensive surveillance     program.  Test performance has     been validated by [employer] for patients greater     than or equal to 81 year old.     It is not intended     to diagnose infection nor to     guide or monitor treatment.  CULTURE, BLOOD (ROUTINE X 2)     Status: None   Collection Time    08/08/13 10:15 AM      Result Value Ref Range Status   Specimen Description BLOOD RIGHT ANTECUBITAL   Final   Special Requests BOTTLES DRAWN AEROBIC AND ANAEROBIC 10CC   Final   Culture  Setup Time     Final   Value: 08/08/2013 16:25   Performed at [facility]   Culture     Final   Value:        BLOOD CULTURE RECEIVED NO GROWTH TO DATE CULTURE WILL BE HELD FOR 5 DAYS BEFORE ISSUING A FINAL NEGATIVE REPORT     Performed at [facility]   Report Status PENDING   Incomplete  CULTURE, BLOOD (ROUTINE X 2)     Status: None   Collection Time    08/08/13 10:20 AM      Result Value Ref Range Status   Specimen Description BLOOD LEFT ANTECUBITAL   Final   Special Requests BOTTLES DRAWN AEROBIC AND ANAEROBIC 10CC   Final   Culture  Setup Time     Final   Value: 08/08/2013 16:25     Performed at [facility]   Culture     Final   Value:        BLOOD CULTURE RECEIVED NO GROWTH TO DATE CULTURE WILL BE HELD FOR 5 DAYS BEFORE ISSUING A FINAL NEGATIVE REPORT     Performed at [facility]   Report Status PENDING   Incomplete  Medical History: Past Medical History  Diagnosis Date  . Diabetes mellitus without complication   . Hypertension   . Renal disorder   . Gallstone   . Arthritis   . DVT (deep venous thrombosis)     2011  . COPD (chronic obstructive pulmonary disease)   . Pneumonia 2014  . Neuropathy   . GERD (gastroesophageal reflux disease)   . Constipation   . Anemia     Medications:   (Not in a hospital admission) Assessment: S/p right iliac stent with fem-fem bypass for non-healing foot would. adx for iv abx per vasc surg. Groin incisions of concern. Estimated crcl ~ 43 ml/min   Goal of Therapy:  Vancomycin trough level 10-15 mcg/ml  Plan:  Vancomycin 1500 mg x1 then 1000 mg q24h  Trough at 4th or 5th dose.  Curlene Dolphin 08/12/2013,12:14 AM

## 2013-08-13 LAB — GLUCOSE, CAPILLARY
Glucose-Capillary: 127 mg/dL — ABNORMAL HIGH (ref 70–99)
Glucose-Capillary: 247 mg/dL — ABNORMAL HIGH (ref 70–99)
Glucose-Capillary: 168 mg/dL — ABNORMAL HIGH (ref 70–99)
Glucose-Capillary: 239 mg/dL — ABNORMAL HIGH (ref 70–99)

## 2013-08-13 MED ORDER — LEVOFLOXACIN 500 MG PO TABS
500.0000 mg | ORAL_TABLET | Freq: Every day | ORAL | Status: DC
  Administered 2013-08-13 – 2013-08-14 (×2): 500 mg via ORAL
  Filled 2013-08-13 (×2): qty 1

## 2013-08-13 NOTE — Progress Notes (Signed)
Placed pt. On cpap. Pt. Is tolerating well at this time.  

## 2013-08-13 NOTE — Progress Notes (Addendum)
[facility] [place]  Subjective  - No new complaints.   Objective 170/55 57 98.4 F (36.9 C) (Oral) 18 97%  Intake/Output Summary (Last 24 hours) at 08/13/13 0854 Last data filed at 08/13/13 0730  Gross per 24 hour  Intake   1190 ml  Output   1526 ml  Net   -336 ml    Groins with dry guaze.  Left slightly pink edges without hematoma or drainage. Right groin C/D without hematoma, draiange or erythema. Feet warm well perfused  Assessment/Planning: Status post right iliac stent and a right to left femoral-femoral bypass graft for left foot ulcer  Continue antibiotics, keep groins clean and dry WBC WNL 7.1  Ulyses Amor 08/13/2013 8:54 AM --  Laboratory Lab Results:  Recent Labs  08/11/13 1839 08/12/13 0028  WBC 7.4 7.1  HGB 9.2* 8.5*  HCT 28.4* 26.5*  PLT 202 186   BMET  Recent Labs  08/11/13 1839 08/12/13 0028 08/12/13 0508  NA 139  --  143  K 4.0  --  3.9  CL 101  --  108  CO2 25  --  24  GLUCOSE 201*  --  123*  BUN 56*  --  46*  CREATININE 1.25* 1.13* 1.04  CALCIUM 8.3*  --  7.8*    COAG Lab Results  Component Value Date   INR 1.15 08/12/2013   INR 1.05 07/30/2013   INR 1.22 07/27/2013   No results found for this basename: PTT      I agree with the above.  I feel that there has been significant improvement in the erythema in each groin.  The patient will get additional IV antibiotics today with plans for starting oral antibiotics with Levaquin later tonight.  If her groins continued to look better tomorrow, I will consider discharge to home tomorrow  Annamarie Major

## 2013-08-14 LAB — GLUCOSE, CAPILLARY: Glucose-Capillary: 115 mg/dL — ABNORMAL HIGH (ref 70–99)

## 2013-08-14 LAB — CULTURE, BLOOD (ROUTINE X 2)
CULTURE: NO GROWTH
Culture: NO GROWTH

## 2013-08-14 MED ORDER — LEVOFLOXACIN 500 MG PO TABS: 500.0000 mg | ORAL_TABLET | Freq: Every day | ORAL | Status: DC

## 2013-08-14 NOTE — Progress Notes (Addendum)
[facility] [place]  Subjective  -" Doing well, Im very pleased with the wounds and how they are getting better in just a few days."   Objective 150/42 56 98.4 F (36.9 C) (Oral) 18 99%  Intake/Output Summary (Last 24 hours) at 08/14/13 0825 Last data filed at 08/14/13 0559  Gross per 24 hour  Intake    840 ml  Output   2100 ml  Net  -1260 ml    Groins no active drainage, slight pink skin edges(improved) Dry gauze to bil groins Feet warm to touch well perfused Left foot and calf ulcers healing well  Assessment/Planning: Status post right iliac stent and a right to left femoral-femoral bypass graft for left foot ulcer  Groins healing nicely D/C home on oral antibiotics Cont dry gauze to bil groins     Ulyses Amor 08/14/2013 8:25 AM --  Laboratory Lab Results:  Recent Labs  08/11/13 1839 08/12/13 0028  WBC 7.4 7.1  HGB 9.2* 8.5*  HCT 28.4* 26.5*  PLT 202 186   BMET  Recent Labs  08/11/13 1839 08/12/13 0028 08/12/13 0508  NA 139  --  143  K 4.0  --  3.9  CL 101  --  108  CO2 25  --  24  GLUCOSE 201*  --  123*  BUN 56*  --  46*  CREATININE 1.25* 1.13* 1.04  CALCIUM 8.3*  --  7.8*    COAG Lab Results  Component Value Date   INR 1.15 08/12/2013   INR 1.05 07/30/2013   INR 1.22 07/27/2013   No results found for this basename: PTT     I agree with the above.  Both groins have improved with no erythema remaining.  The incisions are grossly intact.  I feel the patient can be transferred to home with oral antibiotics.  She will followup with Dr. Oneida Alar this Thursday.  Annamarie Major

## 2013-08-14 NOTE — Progress Notes (Signed)
DC IV and tele per MD orders and protocol; DC instructions reviewed with pt and daughter at bedside; prescription given to daughter.  Carollee Sires, RN

## 2013-08-15 ENCOUNTER — Telehealth: Payer: Self-pay | Admitting: Vascular Surgery

## 2013-08-15 NOTE — Telephone Encounter (Addendum)
Message copied by Gena Fray on Tue Aug 15, 2013 10:01 AM ------      Message from: Mena Goes      Created: Tue Aug 15, 2013  9:48 AM      Regarding: Schedule                   ----- Message -----         From: Ulyses Amor, PA-C         Sent: 08/14/2013   9:39 AM           To: [facility] Charge Pool            F/U with Dr. Oneida Alar this Thursday wound ck. ------  08/15/13: LM for pt re appt, dpm

## 2013-08-16 ENCOUNTER — Encounter: Payer: Self-pay | Admitting: Vascular Surgery

## 2013-08-16 DIAGNOSIS — L97809 Non-pressure chronic ulcer of other part of unspecified lower leg with unspecified severity: Secondary | ICD-10-CM | POA: Diagnosis not present

## 2013-08-16 DIAGNOSIS — Z794 Long term (current) use of insulin: Secondary | ICD-10-CM | POA: Diagnosis not present

## 2013-08-16 DIAGNOSIS — I10 Essential (primary) hypertension: Secondary | ICD-10-CM | POA: Diagnosis not present

## 2013-08-16 DIAGNOSIS — I872 Venous insufficiency (chronic) (peripheral): Secondary | ICD-10-CM | POA: Diagnosis not present

## 2013-08-16 DIAGNOSIS — E119 Type 2 diabetes mellitus without complications: Secondary | ICD-10-CM | POA: Diagnosis not present

## 2013-08-16 DIAGNOSIS — Z48812 Encounter for surgical aftercare following surgery on the circulatory system: Secondary | ICD-10-CM | POA: Diagnosis not present

## 2013-08-17 ENCOUNTER — Encounter: Payer: Self-pay | Admitting: Vascular Surgery

## 2013-08-17 ENCOUNTER — Ambulatory Visit (INDEPENDENT_AMBULATORY_CARE_PROVIDER_SITE_OTHER): Payer: Self-pay | Admitting: Vascular Surgery

## 2013-08-17 VITALS — BP 130/33 | HR 58 | Resp 18 | Ht 62.0 in | Wt 193.0 lb

## 2013-08-17 DIAGNOSIS — I739 Peripheral vascular disease, unspecified: Secondary | ICD-10-CM

## 2013-08-17 DIAGNOSIS — T148XXA Other injury of unspecified body region, initial encounter: Secondary | ICD-10-CM | POA: Insufficient documentation

## 2013-08-17 DIAGNOSIS — Z48812 Encounter for surgical aftercare following surgery on the circulatory system: Secondary | ICD-10-CM

## 2013-08-17 NOTE — Progress Notes (Signed)
[facility] [place]                         Post-op visit   Subjective  - Doing well after Fem-fem by pass graft.  Wounds are healing well. She was in the hospital over the weekend for IV antibiotics.   Objective 130/33 58   18   @IOBRIEF @  Doppler DP/PT bilateral LE Groin incisions are healing well without drainage.   Left groin incision with < 1 mm opening superficial Left foot and medial calf ulcers is scabbed over with out erythema or edema  Assessment/Planning: 1 week s/p fem-fem by pass right to left. Cont. Dry guaze to bilateral groins. F/U in 3 months will get ABI's and bilateral LE arterial duplex   Natalie Porter 08/17/2013 1:31 PM --    Agree with above  Ruta Hinds, MD [facility] [place] Office: [phone] Pager: [phone]

## 2013-08-17 NOTE — Discharge Summary (Signed)
[facility] Discharge Summary   Patient ID:  Natalie Porter MRN: [id number] DOB/AGE: Feb 19, 1941 73 y.o.  Admit date: 08/11/2013 Discharge date: 08/17/2013    Admission Diagnosis: Wound infection [958.3] Status post femoral-popliteal bypass surgery [V45.89]  Discharge Diagnoses:  Wound infection [958.3] Status post femoral-popliteal bypass surgery [V45.89]  Secondary Diagnoses: Past Medical History  Diagnosis Date  . Diabetes mellitus without complication   . Hypertension   . Renal disorder   . Gallstone   . Arthritis   . DVT (deep venous thrombosis)     2011  . COPD (chronic obstructive pulmonary disease)   . Pneumonia 2014  . Neuropathy   . GERD (gastroesophageal reflux disease)   . Constipation   . Anemia       Discharged Condition: good  HPI: 73 year old female recently s/p right common iliac stent followed by right to left femoral femoral bypass for a non-healing left foot wound. She was discharged yesterday 08/10/2013. HH called today with concerns over her groin incisions and she was instructed to goto the ER. She was examed in the ER by Dr. Trula Slade and was found to have bilateral superficial wound infections. With a recent prosthetic graft,  she was to be admitted for IV abx.  Pharmacy consult for vancomycin IV antibiotic. The patient's condition has improved overnight with IV antibiotics. She will continue to receive IV antibiotics and local wound care of dry 4 x 4 to bilateral groins.  Dr. Trula Slade felt that there had been significant improvement in the erythema in each groin. The patient will get additional IV antibiotics today with plans for starting oral antibiotics with Levaquin later tonight. She was discharged home on oral antibiotics and given a follow up visit with Dr. Oneida Alar on Thursday 08/17/2013.   Hospital Course:  Natalie Porter is a 73 y.o. female is S/P Bilateral superficial groin incision infection.  Extubated: POD # 0 Physical  exam: Groins no active drainage, slight pink skin edges(improved)  Dry gauze to bil groins  Feet warm to touch well perfused  Left foot and calf ulcers healing well Pt. Ambulating, voiding and taking PO diet without difficulty. Pt pain controlled with PO pain meds. Labs as below Complications:none  Consults:  Treatment Team:  Serafina Mitchell, MD  Significant Diagnostic Studies: CBC Lab Results  Component Value Date   WBC 7.1 08/12/2013   HGB 8.5* 08/12/2013   HCT 26.5* 08/12/2013   MCV 93.3 08/12/2013   PLT 186 08/12/2013    BMET    Component Value Date/Time   NA 143 08/12/2013 0508   K 3.9 08/12/2013 0508   CL 108 08/12/2013 0508   CO2 24 08/12/2013 0508   GLUCOSE 123* 08/12/2013 0508   BUN 46* 08/12/2013 0508   CREATININE 1.04 08/12/2013 0508   CALCIUM 7.8* 08/12/2013 0508   GFRNONAA 52* 08/12/2013 0508   GFRAA 60* 08/12/2013 0508   COAG Lab Results  Component Value Date   INR 1.15 08/12/2013   INR 1.05 07/30/2013   INR 1.22 07/27/2013     Disposition:  Discharge to :Home Discharge Instructions   Call MD for:  redness, tenderness, or signs of infection (pain, swelling, bleeding, redness, odor or green/yellow discharge around incision site)    Complete by:  As directed      Call MD for:  severe or increased pain, loss or decreased feeling  in affected limb(s)    Complete by:  As directed      Call MD for:  temperature >100.5    Complete by:  As directed      Discharge instructions    Complete by:  As directed   Keep groins clean and dry with 4 x 4, you may shower.     Driving Restrictions    Complete by:  As directed   No driving for 2 weeks     Increase activity slowly    Complete by:  As directed   Walk with assistance use walker or cane as needed     Lifting restrictions    Complete by:  As directed   No lifting for 6 weeks     May shower     Complete by:  As directed      Resume previous diet    Complete by:  As directed             Medication List          acetaminophen 500 MG tablet  Commonly known as:  TYLENOL  Take 500 mg by mouth every 6 (six) hours as needed.     albuterol 108 (90 BASE) MCG/ACT inhaler  Commonly known as:  PROVENTIL HFA;VENTOLIN HFA  Inhale 2 puffs into the lungs every 6 (six) hours as needed for wheezing or shortness of breath.     amLODipine 10 MG tablet  Commonly known as:  NORVASC  Take 10 mg by mouth daily.     calcium-vitamin D 250-125 MG-UNIT per tablet  Commonly known as:  OSCAL  Take 1 tablet by mouth 2 (two) times daily.     carvedilol 25 MG tablet  Commonly known as:  COREG  Take 25 mg by mouth 2 (two) times daily with a meal.     chlorthalidone 50 MG tablet  Commonly known as:  HYGROTON  Take 50 mg by mouth daily.     escitalopram 20 MG tablet  Commonly known as:  LEXAPRO  Take 20 mg by mouth daily.     furosemide 20 MG tablet  Commonly known as:  LASIX  Take 1 tablet (20 mg total) by mouth daily.     gabapentin 100 MG capsule  Commonly known as:  NEURONTIN  Take 100 mg by mouth 2 (two) times daily. 100 mg in the morning and 200 mg at bedtime     glipiZIDE 5 MG 24 hr tablet  Commonly known as:  GLUCOTROL XL  Take 10 mg by mouth daily with breakfast.     HYDROcodone-acetaminophen 10-325 MG per tablet  Commonly known as:  NORCO  Take 1 tablet by mouth every 6 (six) hours as needed.     insulin glargine 100 UNIT/ML injection  Commonly known as:  LANTUS  Inject 13 Units into the skin at bedtime.     levofloxacin 500 MG tablet  Commonly known as:  LEVAQUIN  Take 1 tablet (500 mg total) by mouth daily.     lisinopril 5 MG tablet  Commonly known as:  PRINIVIL,ZESTRIL  Take 5 mg by mouth daily.     pioglitazone 45 MG tablet  Commonly known as:  ACTOS  Take 45 mg by mouth daily.     rosuvastatin 40 MG tablet  Commonly known as:  CRESTOR  Take 40 mg by mouth daily.     TUDORZA PRESSAIR 400 MCG/ACT Aepb  Generic drug:  Aclidinium Bromide  Inhale 1 puff into the lungs every 12  (twelve) hours.       Verbal and written Discharge instructions given to the patient.  Wound care per Discharge AVS     Follow-up Information   Follow up with Elam Dutch, MD In 5 days.   Specialty:  Vascular Surgery   Contact information:   [address] [place] [address] [phone]       Signed: Ulyses Amor 08/17/2013, 10:53 AM

## 2013-08-18 DIAGNOSIS — Z7901 Long term (current) use of anticoagulants: Secondary | ICD-10-CM | POA: Diagnosis not present

## 2013-08-18 DIAGNOSIS — L97809 Non-pressure chronic ulcer of other part of unspecified lower leg with unspecified severity: Secondary | ICD-10-CM | POA: Diagnosis not present

## 2013-08-18 DIAGNOSIS — I872 Venous insufficiency (chronic) (peripheral): Secondary | ICD-10-CM | POA: Diagnosis not present

## 2013-08-18 DIAGNOSIS — I10 Essential (primary) hypertension: Secondary | ICD-10-CM | POA: Diagnosis not present

## 2013-08-18 DIAGNOSIS — E119 Type 2 diabetes mellitus without complications: Secondary | ICD-10-CM | POA: Diagnosis not present

## 2013-08-18 DIAGNOSIS — Z794 Long term (current) use of insulin: Secondary | ICD-10-CM | POA: Diagnosis not present

## 2013-08-18 DIAGNOSIS — Z48812 Encounter for surgical aftercare following surgery on the circulatory system: Secondary | ICD-10-CM | POA: Diagnosis not present

## 2013-08-18 NOTE — Discharge Summary (Signed)
I agree with the above  Natalie Porter 

## 2013-08-21 DIAGNOSIS — Z794 Long term (current) use of insulin: Secondary | ICD-10-CM | POA: Diagnosis not present

## 2013-08-21 DIAGNOSIS — E119 Type 2 diabetes mellitus without complications: Secondary | ICD-10-CM | POA: Diagnosis not present

## 2013-08-21 DIAGNOSIS — L97809 Non-pressure chronic ulcer of other part of unspecified lower leg with unspecified severity: Secondary | ICD-10-CM | POA: Diagnosis not present

## 2013-08-21 DIAGNOSIS — Z48812 Encounter for surgical aftercare following surgery on the circulatory system: Secondary | ICD-10-CM | POA: Diagnosis not present

## 2013-08-21 DIAGNOSIS — I872 Venous insufficiency (chronic) (peripheral): Secondary | ICD-10-CM | POA: Diagnosis not present

## 2013-08-21 DIAGNOSIS — I10 Essential (primary) hypertension: Secondary | ICD-10-CM | POA: Diagnosis not present

## 2013-08-22 DIAGNOSIS — Z794 Long term (current) use of insulin: Secondary | ICD-10-CM | POA: Diagnosis not present

## 2013-08-22 DIAGNOSIS — E119 Type 2 diabetes mellitus without complications: Secondary | ICD-10-CM | POA: Diagnosis not present

## 2013-08-22 DIAGNOSIS — I872 Venous insufficiency (chronic) (peripheral): Secondary | ICD-10-CM | POA: Diagnosis not present

## 2013-08-22 DIAGNOSIS — L97809 Non-pressure chronic ulcer of other part of unspecified lower leg with unspecified severity: Secondary | ICD-10-CM | POA: Diagnosis not present

## 2013-08-22 DIAGNOSIS — I10 Essential (primary) hypertension: Secondary | ICD-10-CM | POA: Diagnosis not present

## 2013-08-22 DIAGNOSIS — Z48812 Encounter for surgical aftercare following surgery on the circulatory system: Secondary | ICD-10-CM | POA: Diagnosis not present

## 2013-08-23 DIAGNOSIS — E119 Type 2 diabetes mellitus without complications: Secondary | ICD-10-CM | POA: Diagnosis not present

## 2013-08-23 DIAGNOSIS — Z794 Long term (current) use of insulin: Secondary | ICD-10-CM | POA: Diagnosis not present

## 2013-08-23 DIAGNOSIS — I872 Venous insufficiency (chronic) (peripheral): Secondary | ICD-10-CM | POA: Diagnosis not present

## 2013-08-23 DIAGNOSIS — L97809 Non-pressure chronic ulcer of other part of unspecified lower leg with unspecified severity: Secondary | ICD-10-CM | POA: Diagnosis not present

## 2013-08-23 DIAGNOSIS — I10 Essential (primary) hypertension: Secondary | ICD-10-CM | POA: Diagnosis not present

## 2013-08-23 DIAGNOSIS — Z48812 Encounter for surgical aftercare following surgery on the circulatory system: Secondary | ICD-10-CM | POA: Diagnosis not present

## 2013-08-24 DIAGNOSIS — L97809 Non-pressure chronic ulcer of other part of unspecified lower leg with unspecified severity: Secondary | ICD-10-CM | POA: Diagnosis not present

## 2013-08-24 DIAGNOSIS — Z794 Long term (current) use of insulin: Secondary | ICD-10-CM | POA: Diagnosis not present

## 2013-08-24 DIAGNOSIS — I872 Venous insufficiency (chronic) (peripheral): Secondary | ICD-10-CM | POA: Diagnosis not present

## 2013-08-24 DIAGNOSIS — E119 Type 2 diabetes mellitus without complications: Secondary | ICD-10-CM | POA: Diagnosis not present

## 2013-08-24 DIAGNOSIS — Z48812 Encounter for surgical aftercare following surgery on the circulatory system: Secondary | ICD-10-CM | POA: Diagnosis not present

## 2013-08-24 DIAGNOSIS — I10 Essential (primary) hypertension: Secondary | ICD-10-CM | POA: Diagnosis not present

## 2013-08-28 DIAGNOSIS — E119 Type 2 diabetes mellitus without complications: Secondary | ICD-10-CM | POA: Diagnosis not present

## 2013-08-28 DIAGNOSIS — I872 Venous insufficiency (chronic) (peripheral): Secondary | ICD-10-CM | POA: Diagnosis not present

## 2013-08-28 DIAGNOSIS — L97809 Non-pressure chronic ulcer of other part of unspecified lower leg with unspecified severity: Secondary | ICD-10-CM | POA: Diagnosis not present

## 2013-08-28 DIAGNOSIS — Z794 Long term (current) use of insulin: Secondary | ICD-10-CM | POA: Diagnosis not present

## 2013-08-28 DIAGNOSIS — Z48812 Encounter for surgical aftercare following surgery on the circulatory system: Secondary | ICD-10-CM | POA: Diagnosis not present

## 2013-08-28 DIAGNOSIS — I10 Essential (primary) hypertension: Secondary | ICD-10-CM | POA: Diagnosis not present

## 2013-08-29 DIAGNOSIS — Z48812 Encounter for surgical aftercare following surgery on the circulatory system: Secondary | ICD-10-CM | POA: Diagnosis not present

## 2013-08-29 DIAGNOSIS — L97809 Non-pressure chronic ulcer of other part of unspecified lower leg with unspecified severity: Secondary | ICD-10-CM | POA: Diagnosis not present

## 2013-08-29 DIAGNOSIS — I872 Venous insufficiency (chronic) (peripheral): Secondary | ICD-10-CM | POA: Diagnosis not present

## 2013-08-29 DIAGNOSIS — Z794 Long term (current) use of insulin: Secondary | ICD-10-CM | POA: Diagnosis not present

## 2013-08-29 DIAGNOSIS — E119 Type 2 diabetes mellitus without complications: Secondary | ICD-10-CM | POA: Diagnosis not present

## 2013-08-29 DIAGNOSIS — I10 Essential (primary) hypertension: Secondary | ICD-10-CM | POA: Diagnosis not present

## 2013-08-30 DIAGNOSIS — L97809 Non-pressure chronic ulcer of other part of unspecified lower leg with unspecified severity: Secondary | ICD-10-CM | POA: Diagnosis not present

## 2013-08-30 DIAGNOSIS — I872 Venous insufficiency (chronic) (peripheral): Secondary | ICD-10-CM | POA: Diagnosis not present

## 2013-08-30 DIAGNOSIS — Z794 Long term (current) use of insulin: Secondary | ICD-10-CM | POA: Diagnosis not present

## 2013-08-30 DIAGNOSIS — E119 Type 2 diabetes mellitus without complications: Secondary | ICD-10-CM | POA: Diagnosis not present

## 2013-08-30 DIAGNOSIS — Z48812 Encounter for surgical aftercare following surgery on the circulatory system: Secondary | ICD-10-CM | POA: Diagnosis not present

## 2013-08-30 DIAGNOSIS — I10 Essential (primary) hypertension: Secondary | ICD-10-CM | POA: Diagnosis not present

## 2013-08-31 DIAGNOSIS — Z794 Long term (current) use of insulin: Secondary | ICD-10-CM | POA: Diagnosis not present

## 2013-08-31 DIAGNOSIS — L97809 Non-pressure chronic ulcer of other part of unspecified lower leg with unspecified severity: Secondary | ICD-10-CM | POA: Diagnosis not present

## 2013-08-31 DIAGNOSIS — E119 Type 2 diabetes mellitus without complications: Secondary | ICD-10-CM | POA: Diagnosis not present

## 2013-08-31 DIAGNOSIS — Z48812 Encounter for surgical aftercare following surgery on the circulatory system: Secondary | ICD-10-CM | POA: Diagnosis not present

## 2013-08-31 DIAGNOSIS — I872 Venous insufficiency (chronic) (peripheral): Secondary | ICD-10-CM | POA: Diagnosis not present

## 2013-08-31 DIAGNOSIS — I10 Essential (primary) hypertension: Secondary | ICD-10-CM | POA: Diagnosis not present

## 2013-09-04 DIAGNOSIS — Z794 Long term (current) use of insulin: Secondary | ICD-10-CM | POA: Diagnosis not present

## 2013-09-04 DIAGNOSIS — I10 Essential (primary) hypertension: Secondary | ICD-10-CM | POA: Diagnosis not present

## 2013-09-04 DIAGNOSIS — E119 Type 2 diabetes mellitus without complications: Secondary | ICD-10-CM | POA: Diagnosis not present

## 2013-09-04 DIAGNOSIS — Z48812 Encounter for surgical aftercare following surgery on the circulatory system: Secondary | ICD-10-CM | POA: Diagnosis not present

## 2013-09-04 DIAGNOSIS — I872 Venous insufficiency (chronic) (peripheral): Secondary | ICD-10-CM | POA: Diagnosis not present

## 2013-09-04 DIAGNOSIS — L97809 Non-pressure chronic ulcer of other part of unspecified lower leg with unspecified severity: Secondary | ICD-10-CM | POA: Diagnosis not present

## 2013-09-05 DIAGNOSIS — I10 Essential (primary) hypertension: Secondary | ICD-10-CM | POA: Diagnosis not present

## 2013-09-05 DIAGNOSIS — Z794 Long term (current) use of insulin: Secondary | ICD-10-CM | POA: Diagnosis not present

## 2013-09-05 DIAGNOSIS — E119 Type 2 diabetes mellitus without complications: Secondary | ICD-10-CM | POA: Diagnosis not present

## 2013-09-05 DIAGNOSIS — I872 Venous insufficiency (chronic) (peripheral): Secondary | ICD-10-CM | POA: Diagnosis not present

## 2013-09-05 DIAGNOSIS — L97809 Non-pressure chronic ulcer of other part of unspecified lower leg with unspecified severity: Secondary | ICD-10-CM | POA: Diagnosis not present

## 2013-09-05 DIAGNOSIS — Z48812 Encounter for surgical aftercare following surgery on the circulatory system: Secondary | ICD-10-CM | POA: Diagnosis not present

## 2013-09-06 DIAGNOSIS — Z48812 Encounter for surgical aftercare following surgery on the circulatory system: Secondary | ICD-10-CM | POA: Diagnosis not present

## 2013-09-06 DIAGNOSIS — E119 Type 2 diabetes mellitus without complications: Secondary | ICD-10-CM | POA: Diagnosis not present

## 2013-09-06 DIAGNOSIS — I10 Essential (primary) hypertension: Secondary | ICD-10-CM | POA: Diagnosis not present

## 2013-09-06 DIAGNOSIS — Z794 Long term (current) use of insulin: Secondary | ICD-10-CM | POA: Diagnosis not present

## 2013-09-06 DIAGNOSIS — I872 Venous insufficiency (chronic) (peripheral): Secondary | ICD-10-CM | POA: Diagnosis not present

## 2013-09-06 DIAGNOSIS — L97809 Non-pressure chronic ulcer of other part of unspecified lower leg with unspecified severity: Secondary | ICD-10-CM | POA: Diagnosis not present

## 2013-09-11 DIAGNOSIS — L97809 Non-pressure chronic ulcer of other part of unspecified lower leg with unspecified severity: Secondary | ICD-10-CM | POA: Diagnosis not present

## 2013-09-11 DIAGNOSIS — Z09 Encounter for follow-up examination after completed treatment for conditions other than malignant neoplasm: Secondary | ICD-10-CM | POA: Diagnosis not present

## 2013-09-11 DIAGNOSIS — I872 Venous insufficiency (chronic) (peripheral): Secondary | ICD-10-CM | POA: Diagnosis not present

## 2013-09-11 DIAGNOSIS — E119 Type 2 diabetes mellitus without complications: Secondary | ICD-10-CM | POA: Diagnosis not present

## 2013-09-11 DIAGNOSIS — Z48812 Encounter for surgical aftercare following surgery on the circulatory system: Secondary | ICD-10-CM | POA: Diagnosis not present

## 2013-09-11 DIAGNOSIS — I10 Essential (primary) hypertension: Secondary | ICD-10-CM | POA: Diagnosis not present

## 2013-09-11 DIAGNOSIS — I739 Peripheral vascular disease, unspecified: Secondary | ICD-10-CM | POA: Diagnosis not present

## 2013-09-11 DIAGNOSIS — Z794 Long term (current) use of insulin: Secondary | ICD-10-CM | POA: Diagnosis not present

## 2013-09-12 DIAGNOSIS — I10 Essential (primary) hypertension: Secondary | ICD-10-CM | POA: Diagnosis not present

## 2013-09-12 DIAGNOSIS — E119 Type 2 diabetes mellitus without complications: Secondary | ICD-10-CM | POA: Diagnosis not present

## 2013-09-12 DIAGNOSIS — L97809 Non-pressure chronic ulcer of other part of unspecified lower leg with unspecified severity: Secondary | ICD-10-CM | POA: Diagnosis not present

## 2013-09-12 DIAGNOSIS — Z794 Long term (current) use of insulin: Secondary | ICD-10-CM | POA: Diagnosis not present

## 2013-09-12 DIAGNOSIS — Z48812 Encounter for surgical aftercare following surgery on the circulatory system: Secondary | ICD-10-CM | POA: Diagnosis not present

## 2013-09-12 DIAGNOSIS — I872 Venous insufficiency (chronic) (peripheral): Secondary | ICD-10-CM | POA: Diagnosis not present

## 2013-09-14 DIAGNOSIS — I872 Venous insufficiency (chronic) (peripheral): Secondary | ICD-10-CM | POA: Diagnosis not present

## 2013-09-14 DIAGNOSIS — I10 Essential (primary) hypertension: Secondary | ICD-10-CM | POA: Diagnosis not present

## 2013-09-14 DIAGNOSIS — L97809 Non-pressure chronic ulcer of other part of unspecified lower leg with unspecified severity: Secondary | ICD-10-CM | POA: Diagnosis not present

## 2013-09-14 DIAGNOSIS — E119 Type 2 diabetes mellitus without complications: Secondary | ICD-10-CM | POA: Diagnosis not present

## 2013-09-14 DIAGNOSIS — Z794 Long term (current) use of insulin: Secondary | ICD-10-CM | POA: Diagnosis not present

## 2013-09-14 DIAGNOSIS — Z48812 Encounter for surgical aftercare following surgery on the circulatory system: Secondary | ICD-10-CM | POA: Diagnosis not present

## 2013-09-18 DIAGNOSIS — R0989 Other specified symptoms and signs involving the circulatory and respiratory systems: Secondary | ICD-10-CM | POA: Diagnosis not present

## 2013-09-18 DIAGNOSIS — R0789 Other chest pain: Secondary | ICD-10-CM | POA: Diagnosis not present

## 2013-09-18 DIAGNOSIS — R0602 Shortness of breath: Secondary | ICD-10-CM | POA: Diagnosis not present

## 2013-09-18 DIAGNOSIS — R079 Chest pain, unspecified: Secondary | ICD-10-CM | POA: Diagnosis not present

## 2013-09-19 DIAGNOSIS — R0602 Shortness of breath: Secondary | ICD-10-CM | POA: Diagnosis not present

## 2013-09-19 DIAGNOSIS — Z48812 Encounter for surgical aftercare following surgery on the circulatory system: Secondary | ICD-10-CM | POA: Diagnosis not present

## 2013-09-19 DIAGNOSIS — Z794 Long term (current) use of insulin: Secondary | ICD-10-CM | POA: Diagnosis not present

## 2013-09-19 DIAGNOSIS — R079 Chest pain, unspecified: Secondary | ICD-10-CM | POA: Diagnosis not present

## 2013-09-19 DIAGNOSIS — I872 Venous insufficiency (chronic) (peripheral): Secondary | ICD-10-CM | POA: Diagnosis not present

## 2013-09-19 DIAGNOSIS — I10 Essential (primary) hypertension: Secondary | ICD-10-CM | POA: Diagnosis not present

## 2013-09-19 DIAGNOSIS — L97809 Non-pressure chronic ulcer of other part of unspecified lower leg with unspecified severity: Secondary | ICD-10-CM | POA: Diagnosis not present

## 2013-09-19 DIAGNOSIS — E119 Type 2 diabetes mellitus without complications: Secondary | ICD-10-CM | POA: Diagnosis not present

## 2013-09-21 DIAGNOSIS — L97809 Non-pressure chronic ulcer of other part of unspecified lower leg with unspecified severity: Secondary | ICD-10-CM | POA: Diagnosis not present

## 2013-09-21 DIAGNOSIS — I10 Essential (primary) hypertension: Secondary | ICD-10-CM | POA: Diagnosis not present

## 2013-09-21 DIAGNOSIS — Z794 Long term (current) use of insulin: Secondary | ICD-10-CM | POA: Diagnosis not present

## 2013-09-21 DIAGNOSIS — I872 Venous insufficiency (chronic) (peripheral): Secondary | ICD-10-CM | POA: Diagnosis not present

## 2013-09-21 DIAGNOSIS — Z48812 Encounter for surgical aftercare following surgery on the circulatory system: Secondary | ICD-10-CM | POA: Diagnosis not present

## 2013-09-21 DIAGNOSIS — E119 Type 2 diabetes mellitus without complications: Secondary | ICD-10-CM | POA: Diagnosis not present

## 2013-09-29 DIAGNOSIS — Z794 Long term (current) use of insulin: Secondary | ICD-10-CM | POA: Diagnosis not present

## 2013-09-29 DIAGNOSIS — Z48812 Encounter for surgical aftercare following surgery on the circulatory system: Secondary | ICD-10-CM | POA: Diagnosis not present

## 2013-09-29 DIAGNOSIS — I872 Venous insufficiency (chronic) (peripheral): Secondary | ICD-10-CM | POA: Diagnosis not present

## 2013-09-29 DIAGNOSIS — E119 Type 2 diabetes mellitus without complications: Secondary | ICD-10-CM | POA: Diagnosis not present

## 2013-09-29 DIAGNOSIS — I10 Essential (primary) hypertension: Secondary | ICD-10-CM | POA: Diagnosis not present

## 2013-09-29 DIAGNOSIS — L97809 Non-pressure chronic ulcer of other part of unspecified lower leg with unspecified severity: Secondary | ICD-10-CM | POA: Diagnosis not present

## 2013-10-02 DIAGNOSIS — I2510 Atherosclerotic heart disease of native coronary artery without angina pectoris: Secondary | ICD-10-CM | POA: Diagnosis not present

## 2013-10-02 DIAGNOSIS — E119 Type 2 diabetes mellitus without complications: Secondary | ICD-10-CM | POA: Diagnosis not present

## 2013-10-02 DIAGNOSIS — R918 Other nonspecific abnormal finding of lung field: Secondary | ICD-10-CM | POA: Diagnosis not present

## 2013-10-02 DIAGNOSIS — I739 Peripheral vascular disease, unspecified: Secondary | ICD-10-CM | POA: Diagnosis not present

## 2013-10-02 DIAGNOSIS — I509 Heart failure, unspecified: Secondary | ICD-10-CM | POA: Diagnosis not present

## 2013-10-02 DIAGNOSIS — E785 Hyperlipidemia, unspecified: Secondary | ICD-10-CM | POA: Diagnosis not present

## 2013-10-02 DIAGNOSIS — J984 Other disorders of lung: Secondary | ICD-10-CM | POA: Diagnosis not present

## 2013-10-02 DIAGNOSIS — J449 Chronic obstructive pulmonary disease, unspecified: Secondary | ICD-10-CM | POA: Diagnosis not present

## 2013-10-02 DIAGNOSIS — I10 Essential (primary) hypertension: Secondary | ICD-10-CM | POA: Diagnosis not present

## 2013-10-03 DIAGNOSIS — I110 Hypertensive heart disease with heart failure: Secondary | ICD-10-CM | POA: Diagnosis not present

## 2013-10-03 DIAGNOSIS — Z79899 Other long term (current) drug therapy: Secondary | ICD-10-CM | POA: Diagnosis not present

## 2013-10-03 DIAGNOSIS — I5022 Chronic systolic (congestive) heart failure: Secondary | ICD-10-CM | POA: Diagnosis not present

## 2013-10-03 DIAGNOSIS — E559 Vitamin D deficiency, unspecified: Secondary | ICD-10-CM | POA: Diagnosis not present

## 2013-10-03 DIAGNOSIS — D539 Nutritional anemia, unspecified: Secondary | ICD-10-CM | POA: Diagnosis not present

## 2013-10-03 DIAGNOSIS — D509 Iron deficiency anemia, unspecified: Secondary | ICD-10-CM | POA: Diagnosis not present

## 2013-10-03 DIAGNOSIS — E1129 Type 2 diabetes mellitus with other diabetic kidney complication: Secondary | ICD-10-CM | POA: Diagnosis not present

## 2013-10-03 DIAGNOSIS — I509 Heart failure, unspecified: Secondary | ICD-10-CM | POA: Diagnosis not present

## 2013-10-03 DIAGNOSIS — R0609 Other forms of dyspnea: Secondary | ICD-10-CM | POA: Diagnosis not present

## 2013-10-05 DIAGNOSIS — I10 Essential (primary) hypertension: Secondary | ICD-10-CM | POA: Diagnosis not present

## 2013-10-05 DIAGNOSIS — Z48812 Encounter for surgical aftercare following surgery on the circulatory system: Secondary | ICD-10-CM | POA: Diagnosis not present

## 2013-10-05 DIAGNOSIS — E119 Type 2 diabetes mellitus without complications: Secondary | ICD-10-CM | POA: Diagnosis not present

## 2013-10-05 DIAGNOSIS — L97809 Non-pressure chronic ulcer of other part of unspecified lower leg with unspecified severity: Secondary | ICD-10-CM | POA: Diagnosis not present

## 2013-10-05 DIAGNOSIS — I872 Venous insufficiency (chronic) (peripheral): Secondary | ICD-10-CM | POA: Diagnosis not present

## 2013-10-05 DIAGNOSIS — Z794 Long term (current) use of insulin: Secondary | ICD-10-CM | POA: Diagnosis not present

## 2013-10-10 DIAGNOSIS — I509 Heart failure, unspecified: Secondary | ICD-10-CM | POA: Diagnosis not present

## 2013-10-10 DIAGNOSIS — E119 Type 2 diabetes mellitus without complications: Secondary | ICD-10-CM | POA: Diagnosis not present

## 2013-10-10 DIAGNOSIS — Z794 Long term (current) use of insulin: Secondary | ICD-10-CM | POA: Diagnosis not present

## 2013-10-10 DIAGNOSIS — I872 Venous insufficiency (chronic) (peripheral): Secondary | ICD-10-CM | POA: Diagnosis not present

## 2013-10-10 DIAGNOSIS — I10 Essential (primary) hypertension: Secondary | ICD-10-CM | POA: Diagnosis not present

## 2013-10-24 DIAGNOSIS — E1129 Type 2 diabetes mellitus with other diabetic kidney complication: Secondary | ICD-10-CM | POA: Diagnosis not present

## 2013-10-24 DIAGNOSIS — N183 Chronic kidney disease, stage 3 unspecified: Secondary | ICD-10-CM | POA: Diagnosis not present

## 2013-10-24 DIAGNOSIS — E538 Deficiency of other specified B group vitamins: Secondary | ICD-10-CM | POA: Diagnosis not present

## 2013-10-24 DIAGNOSIS — I5022 Chronic systolic (congestive) heart failure: Secondary | ICD-10-CM | POA: Diagnosis not present

## 2013-10-24 DIAGNOSIS — Z23 Encounter for immunization: Secondary | ICD-10-CM | POA: Diagnosis not present

## 2013-11-07 DIAGNOSIS — R911 Solitary pulmonary nodule: Secondary | ICD-10-CM | POA: Diagnosis not present

## 2013-11-22 ENCOUNTER — Encounter: Payer: Self-pay | Admitting: Vascular Surgery

## 2013-11-23 ENCOUNTER — Encounter: Payer: Self-pay | Admitting: Vascular Surgery

## 2013-11-23 ENCOUNTER — Ambulatory Visit (HOSPITAL_COMMUNITY)
Admission: RE | Admit: 2013-11-23 | Discharge: 2013-11-23 | Disposition: A | Discharge status: Home / Self Care | Payer: Medicare Other | Source: Ambulatory Visit | Attending: Vascular Surgery | Admitting: Vascular Surgery

## 2013-11-23 ENCOUNTER — Ambulatory Visit (INDEPENDENT_AMBULATORY_CARE_PROVIDER_SITE_OTHER): Payer: Medicare Other | Admitting: Vascular Surgery

## 2013-11-23 ENCOUNTER — Ambulatory Visit (INDEPENDENT_AMBULATORY_CARE_PROVIDER_SITE_OTHER)
Admission: RE | Admit: 2013-11-23 | Discharge: 2013-11-23 | Disposition: A | Discharge status: Home / Self Care | Payer: Medicare Other | Source: Ambulatory Visit | Attending: Vascular Surgery | Admitting: Vascular Surgery

## 2013-11-23 ENCOUNTER — Other Ambulatory Visit: Payer: Self-pay | Admitting: Vascular Surgery

## 2013-11-23 VITALS — BP 146/42 | HR 46 | Ht 62.0 in | Wt 199.0 lb

## 2013-11-23 DIAGNOSIS — T148XXA Other injury of unspecified body region, initial encounter: Secondary | ICD-10-CM

## 2013-11-23 DIAGNOSIS — X58XXXA Exposure to other specified factors, initial encounter: Secondary | ICD-10-CM | POA: Diagnosis not present

## 2013-11-23 DIAGNOSIS — Z48812 Encounter for surgical aftercare following surgery on the circulatory system: Secondary | ICD-10-CM | POA: Insufficient documentation

## 2013-11-23 DIAGNOSIS — E119 Type 2 diabetes mellitus without complications: Secondary | ICD-10-CM | POA: Insufficient documentation

## 2013-11-23 DIAGNOSIS — T07XXXA Unspecified multiple injuries, initial encounter: Secondary | ICD-10-CM

## 2013-11-23 DIAGNOSIS — E785 Hyperlipidemia, unspecified: Secondary | ICD-10-CM | POA: Insufficient documentation

## 2013-11-23 DIAGNOSIS — I10 Essential (primary) hypertension: Secondary | ICD-10-CM | POA: Diagnosis not present

## 2013-11-23 DIAGNOSIS — Z87891 Personal history of nicotine dependence: Secondary | ICD-10-CM | POA: Diagnosis not present

## 2013-11-23 DIAGNOSIS — I739 Peripheral vascular disease, unspecified: Secondary | ICD-10-CM | POA: Diagnosis not present

## 2013-11-23 NOTE — Progress Notes (Signed)
[facility] [place]                                     Subjective  - Doing well after Fem-fem by pass graft 08/07/2013.  Wounds are healed. She is walking without claudication symptoms. She has no rest pain in her feet. She does occasionally get some swelling of her legs when she is on her feet all day. She is not smoking. She states she has gained some weight.   Objective Filed Vitals:   11/23/13 1142  BP: 146/42  Pulse: 46  Height: 5\' 2"  (1.575 m)  Weight: 199 lb (90.266 kg)  SpO2: 100%    Doppler DP/PT bilateral LE Groin incisions are healed Palpable femoral femoral graft pulse   Data: Graft duplex scan performed today shows a patent femoral-femoral bypass with possibly some increased velocities at the proximal anastomosis between 388 and 400 toe pressure right-side 69 left side 54.  ABIs artificially inflated secondary to calcification  Assessment/Planning:  s/p fem-fem by pass right to left. F/U in 3 months will get ABI's and graft duplex  Ruta Hinds, MD [facility] [place] Office: [phone] Pager: [phone]

## 2013-11-23 NOTE — Addendum Note (Signed)
Addended by: Mena Goes on: 11/23/2013 01:13 PM   Modules accepted: Orders

## 2013-12-01 DIAGNOSIS — H903 Sensorineural hearing loss, bilateral: Secondary | ICD-10-CM | POA: Diagnosis not present

## 2013-12-01 DIAGNOSIS — R059 Cough, unspecified: Secondary | ICD-10-CM | POA: Diagnosis not present

## 2013-12-01 DIAGNOSIS — R05 Cough: Secondary | ICD-10-CM | POA: Diagnosis not present

## 2013-12-01 DIAGNOSIS — K219 Gastro-esophageal reflux disease without esophagitis: Secondary | ICD-10-CM | POA: Diagnosis not present

## 2013-12-17 DIAGNOSIS — J449 Chronic obstructive pulmonary disease, unspecified: Secondary | ICD-10-CM | POA: Diagnosis not present

## 2013-12-17 DIAGNOSIS — Z79899 Other long term (current) drug therapy: Secondary | ICD-10-CM | POA: Diagnosis not present

## 2013-12-17 DIAGNOSIS — E7800 Pure hypercholesterolemia, unspecified: Secondary | ICD-10-CM | POA: Diagnosis not present

## 2013-12-17 DIAGNOSIS — S8990XA Unspecified injury of unspecified lower leg, initial encounter: Secondary | ICD-10-CM | POA: Diagnosis not present

## 2013-12-17 DIAGNOSIS — E119 Type 2 diabetes mellitus without complications: Secondary | ICD-10-CM | POA: Diagnosis not present

## 2013-12-17 DIAGNOSIS — I10 Essential (primary) hypertension: Secondary | ICD-10-CM | POA: Diagnosis not present

## 2013-12-17 DIAGNOSIS — M7989 Other specified soft tissue disorders: Secondary | ICD-10-CM | POA: Diagnosis not present

## 2013-12-17 DIAGNOSIS — K219 Gastro-esophageal reflux disease without esophagitis: Secondary | ICD-10-CM | POA: Diagnosis not present

## 2013-12-17 DIAGNOSIS — I509 Heart failure, unspecified: Secondary | ICD-10-CM | POA: Diagnosis not present

## 2013-12-17 DIAGNOSIS — M25579 Pain in unspecified ankle and joints of unspecified foot: Secondary | ICD-10-CM | POA: Diagnosis not present

## 2013-12-17 DIAGNOSIS — Z794 Long term (current) use of insulin: Secondary | ICD-10-CM | POA: Diagnosis not present

## 2013-12-17 DIAGNOSIS — S99929A Unspecified injury of unspecified foot, initial encounter: Secondary | ICD-10-CM | POA: Diagnosis not present

## 2013-12-17 DIAGNOSIS — M79609 Pain in unspecified limb: Secondary | ICD-10-CM | POA: Diagnosis not present

## 2013-12-17 DIAGNOSIS — X58XXXA Exposure to other specified factors, initial encounter: Secondary | ICD-10-CM | POA: Diagnosis not present

## 2013-12-17 DIAGNOSIS — S93609A Unspecified sprain of unspecified foot, initial encounter: Secondary | ICD-10-CM | POA: Diagnosis not present

## 2013-12-19 DIAGNOSIS — I509 Heart failure, unspecified: Secondary | ICD-10-CM | POA: Diagnosis not present

## 2013-12-19 DIAGNOSIS — E7800 Pure hypercholesterolemia, unspecified: Secondary | ICD-10-CM | POA: Diagnosis not present

## 2013-12-19 DIAGNOSIS — E1129 Type 2 diabetes mellitus with other diabetic kidney complication: Secondary | ICD-10-CM | POA: Diagnosis not present

## 2013-12-19 DIAGNOSIS — I110 Hypertensive heart disease with heart failure: Secondary | ICD-10-CM | POA: Diagnosis not present

## 2013-12-19 DIAGNOSIS — Z23 Encounter for immunization: Secondary | ICD-10-CM | POA: Diagnosis not present

## 2014-01-09 DIAGNOSIS — K219 Gastro-esophageal reflux disease without esophagitis: Secondary | ICD-10-CM | POA: Diagnosis not present

## 2014-01-09 DIAGNOSIS — H903 Sensorineural hearing loss, bilateral: Secondary | ICD-10-CM | POA: Diagnosis not present

## 2014-01-30 DIAGNOSIS — E1165 Type 2 diabetes mellitus with hyperglycemia: Secondary | ICD-10-CM | POA: Diagnosis not present

## 2014-01-30 DIAGNOSIS — Z79899 Other long term (current) drug therapy: Secondary | ICD-10-CM | POA: Diagnosis not present

## 2014-01-30 DIAGNOSIS — E780 Pure hypercholesterolemia: Secondary | ICD-10-CM | POA: Diagnosis not present

## 2014-02-02 DIAGNOSIS — E780 Pure hypercholesterolemia: Secondary | ICD-10-CM | POA: Diagnosis not present

## 2014-02-02 DIAGNOSIS — N183 Chronic kidney disease, stage 3 (moderate): Secondary | ICD-10-CM | POA: Diagnosis not present

## 2014-02-02 DIAGNOSIS — Z0000 Encounter for general adult medical examination without abnormal findings: Secondary | ICD-10-CM | POA: Diagnosis not present

## 2014-02-02 DIAGNOSIS — E1121 Type 2 diabetes mellitus with diabetic nephropathy: Secondary | ICD-10-CM | POA: Diagnosis not present

## 2014-02-02 DIAGNOSIS — G629 Polyneuropathy, unspecified: Secondary | ICD-10-CM | POA: Diagnosis not present

## 2014-02-26 ENCOUNTER — Other Ambulatory Visit: Payer: Self-pay | Admitting: *Deleted

## 2014-02-26 DIAGNOSIS — I739 Peripheral vascular disease, unspecified: Secondary | ICD-10-CM

## 2014-02-26 DIAGNOSIS — Z48812 Encounter for surgical aftercare following surgery on the circulatory system: Secondary | ICD-10-CM

## 2014-02-28 ENCOUNTER — Encounter: Payer: Self-pay | Admitting: Family

## 2014-03-01 ENCOUNTER — Encounter (HOSPITAL_COMMUNITY): Payer: Self-pay | Admitting: Surgery

## 2014-03-01 ENCOUNTER — Encounter (HOSPITAL_COMMUNITY): Payer: Medicare Other

## 2014-03-01 ENCOUNTER — Ambulatory Visit: Payer: Medicare Other | Admitting: Family

## 2014-03-01 ENCOUNTER — Inpatient Hospital Stay (HOSPITAL_COMMUNITY)
Admission: RE | Admit: 2014-03-01 | Discharge: 2014-03-01 | Disposition: A | Discharge status: Home / Self Care | Payer: Medicare Other | Source: Ambulatory Visit | Attending: Vascular Surgery | Admitting: Vascular Surgery

## 2014-03-01 DIAGNOSIS — Z48812 Encounter for surgical aftercare following surgery on the circulatory system: Secondary | ICD-10-CM

## 2014-03-01 DIAGNOSIS — I739 Peripheral vascular disease, unspecified: Secondary | ICD-10-CM

## 2014-03-08 DIAGNOSIS — E785 Hyperlipidemia, unspecified: Secondary | ICD-10-CM | POA: Diagnosis not present

## 2014-03-08 DIAGNOSIS — I739 Peripheral vascular disease, unspecified: Secondary | ICD-10-CM | POA: Diagnosis not present

## 2014-03-08 DIAGNOSIS — I10 Essential (primary) hypertension: Secondary | ICD-10-CM | POA: Diagnosis not present

## 2014-03-08 DIAGNOSIS — E119 Type 2 diabetes mellitus without complications: Secondary | ICD-10-CM | POA: Diagnosis not present

## 2014-03-27 ENCOUNTER — Encounter: Payer: Self-pay | Admitting: Family

## 2014-03-28 ENCOUNTER — Ambulatory Visit (INDEPENDENT_AMBULATORY_CARE_PROVIDER_SITE_OTHER)
Admission: RE | Admit: 2014-03-28 | Discharge: 2014-03-28 | Disposition: A | Discharge status: Home / Self Care | Payer: Medicare Other | Source: Ambulatory Visit | Attending: Family | Admitting: Family

## 2014-03-28 ENCOUNTER — Ambulatory Visit (INDEPENDENT_AMBULATORY_CARE_PROVIDER_SITE_OTHER): Payer: Medicare Other | Admitting: Family

## 2014-03-28 ENCOUNTER — Encounter: Payer: Self-pay | Admitting: Family

## 2014-03-28 ENCOUNTER — Ambulatory Visit (HOSPITAL_COMMUNITY)
Admission: RE | Admit: 2014-03-28 | Discharge: 2014-03-28 | Disposition: A | Discharge status: Home / Self Care | Payer: Medicare Other | Source: Ambulatory Visit | Attending: Family | Admitting: Family

## 2014-03-28 VITALS — BP 138/62 | HR 43 | Resp 14 | Ht 62.0 in | Wt 196.0 lb

## 2014-03-28 DIAGNOSIS — Z48812 Encounter for surgical aftercare following surgery on the circulatory system: Secondary | ICD-10-CM

## 2014-03-28 DIAGNOSIS — I739 Peripheral vascular disease, unspecified: Secondary | ICD-10-CM

## 2014-03-28 DIAGNOSIS — Z9889 Other specified postprocedural states: Secondary | ICD-10-CM | POA: Diagnosis not present

## 2014-03-28 DIAGNOSIS — Z87891 Personal history of nicotine dependence: Secondary | ICD-10-CM

## 2014-03-28 DIAGNOSIS — Z09 Encounter for follow-up examination after completed treatment for conditions other than malignant neoplasm: Secondary | ICD-10-CM | POA: Insufficient documentation

## 2014-03-28 DIAGNOSIS — Z95820 Peripheral vascular angioplasty status with implants and grafts: Secondary | ICD-10-CM | POA: Insufficient documentation

## 2014-03-28 NOTE — Progress Notes (Signed)
[facility] HISTORY AND PHYSICAL -PAD  History of Present Illness Natalie Porter is a 74 y.o. female patient of Dr. Oneida Alar who is s/p right to left Fem-fem by pass graft 08/07/2013. Wounds are healed. She is walking without claudication symptoms. She has no rest pain in her feet. She does occasionally get some swelling of her legs when she is on her feet all day. She is not smoking. She states she has gained some weight. She had a right CEA in 1990, last [place] of neck per pt was in [place] years ago. Pt describes transient amaurosis fugax of right eye prior to right CEA, dneis any history of hemiparesis or speech difficulties. On 11/23/13 Graft duplex scan performed showed a patent femoral-femoral bypass with possibly some increased velocities at the proximal anastomosis between 388 and 400 toe pressure right-side 69 left side 54. ABIs artificially inflated secondary to calcification.  The patient denies New Medical or Surgical History. She uses CPAP for her OSA.  Pt Diabetic: Yes, states in good control Pt smoker: former smoker, quit in the remote past  Pt meds include: Statin :Yes Betablocker: Yes ASA: Yes Other anticoagulants/antiplatelets: no  Past Medical History  Diagnosis Date  . Diabetes mellitus without complication   . Hypertension   . Renal disorder   . Gallstone   . Arthritis   . DVT (deep venous thrombosis)     2011  . COPD (chronic obstructive pulmonary disease)   . Pneumonia 2014  . Neuropathy   . GERD (gastroesophageal reflux disease)   . Constipation   . Anemia     Social History History  Substance Use Topics  . Smoking status: Former Smoker -- 60 years    Quit date: 03/28/1988  . Smokeless tobacco: Never Used  . Alcohol Use: No    Family History Family History  Problem Relation Age of Onset  . Heart attack Mother   . Stroke Father   . Heart attack Father   . Cancer Sister     [name]  . Cancer Brother   .  Cancer Sister     Lung    Past Surgical History  Procedure Laterality Date  . Tubal ligation    . Carpal tunnel release Bilateral   . Eye surgery Bilateral     cataract  . Foot surgery Bilateral     bone spurs, and repair  . Carotid endarterectomy Right 1990  . Breast biopsy Left   . Lung biopsy      non cancerous  . Colonoscopy w/ polypectomy    . Femoral-femoral bypass graft  08/07/2013    Procedure: BYPASS GRAFT FEMORAL-FEMORAL ARTERY USING 8MM X 30CM HEMASHIELD GRAFT; LEFT ILIAC ANGIOGRAM; ATTEMPTED LEFT ILIAC ARTERY STENT GRAFT;  Surgeon: Elam Dutch, MD;  Location: [facility];  Service: Vascular;;  . Application of wound vac Bilateral 08/07/2013    Procedure: APPLICATION OF INCISIONAL WOUND [place];  Surgeon: Elam Dutch, MD;  Location: [facility];  Service: Vascular;  Laterality: Bilateral;  . Endarterectomy femoral Left 08/07/2013    Procedure: ENDARTERECTOMY FEMORAL;  Surgeon: Elam Dutch, MD;  Location: [facility];  Service: Vascular;  Laterality: Left;  . Abdominal aortagram N/A 07/25/2013    Procedure: ABDOMINAL Maxcine Ham;  Surgeon: Serafina Mitchell, MD;  Location: [facility] CATH LAB;  Service: Cardiovascular;  Laterality: N/A;  . Left heart catheterization with coronary angiogram N/A 07/27/2013    Procedure: LEFT HEART CATHETERIZATION WITH CORONARY ANGIOGRAM;  Surgeon: Minus Breeding, MD;  Location: [facility] CATH LAB;  Service: Cardiovascular;  Laterality: N/A;    Allergies  Allergen Reactions  . Codeine Nausea Only  . Morphine And Related Nausea And Vomiting    Current Outpatient Prescriptions  Medication Sig Dispense Refill  . acetaminophen (TYLENOL) 500 MG tablet Take 500 mg by mouth every 6 (six) hours as needed.    . Aclidinium Bromide (TUDORZA PRESSAIR) 400 MCG/ACT AEPB Inhale 1 puff into the lungs every 12 (twelve) hours.    Marland Kitchen albuterol (PROVENTIL HFA;VENTOLIN HFA) 108 (90 BASE) MCG/ACT inhaler Inhale 2 puffs into the lungs every 6 (six) hours as needed for wheezing or shortness of  breath.    Marland Kitchen amLODipine (NORVASC) 10 MG tablet Take 10 mg by mouth daily.    Marland Kitchen aspirin EC 81 MG tablet Take 81 mg by mouth daily.    . calcium-vitamin D (OSCAL) 250-125 MG-UNIT per tablet Take 1 tablet by mouth 2 (two) times daily.    . carvedilol (COREG) 25 MG tablet Take 25 mg by mouth 2 (two) times daily with a meal.    . chlorthalidone (HYGROTON) 50 MG tablet Take 50 mg by mouth daily.    Marland Kitchen escitalopram (LEXAPRO) 20 MG tablet Take 20 mg by mouth daily.    . ferrous sulfate 325 (65 FE) MG tablet Take 325 mg by mouth daily with breakfast.    . furosemide (LASIX) 20 MG tablet Take 1 tablet (20 mg total) by mouth daily. 30 tablet 1  . gabapentin (NEURONTIN) 100 MG capsule Take 100 mg by mouth 2 (two) times daily. 100 mg in the morning and 200 mg at bedtime    . HYDROcodone-acetaminophen (NORCO) 10-325 MG per tablet Take 1 tablet by mouth every 6 (six) hours as needed. (Patient taking differently: Take 1 tablet by mouth as needed. ) 30 tablet 0  . insulin glargine (LANTUS) 100 UNIT/ML injection Inject 13 Units into the skin at bedtime.    Marland Kitchen levofloxacin (LEVAQUIN) 500 MG tablet Take 1 tablet (500 mg total) by mouth daily. 14 tablet 1  . lisinopril (PRINIVIL,ZESTRIL) 5 MG tablet Take 5 mg by mouth daily.    . montelukast (SINGULAIR) 10 MG tablet Take 10 mg by mouth at bedtime.    . pioglitazone (ACTOS) 45 MG tablet Take 45 mg by mouth daily.    . rosuvastatin (CRESTOR) 40 MG tablet Take 40 mg by mouth daily.    . Vitamin D, Ergocalciferol, (DRISDOL) 50000 UNITS CAPS capsule Take 50,000 Units by mouth every 7 (seven) days.    Marland Kitchen glipiZIDE (GLUCOTROL XL) 5 MG 24 hr tablet Take 10 mg by mouth daily with breakfast.     No current facility-administered medications for this visit.    ROS: See HPI for pertinent positives and negatives.   Physical Examination  Filed Vitals:   03/28/14 1232  BP: 138/62  Pulse: 43  Resp: 14  Height: 5\' 2"  (1.575 m)  Weight: 196 lb (88.905 kg)  SpO2: 98%    Body mass index is 35.84 kg/(m^2).  General: A&O x 3, WDWN, obese female. Gait: normal Eyes: PERRLA. Pulmonary: CTAB, without wheezes , rales or rhonchi. Cardiac: regular Rythm , without detected murmur.         Carotid Bruits Right Left   Positive Positive  Aorta is not palpable. Radial pulses: 2+ palpable and =                           VASCULAR EXAM: Extremities  without ischemic changes  without Gangrene; without open wounds.                                                                                                          LE Pulses Right Left       FEMORAL  not palpable  not palpable        POPLITEAL  not palpable   not palpable       POSTERIOR TIBIAL  not palpable   not palpable        DORSALIS PEDIS      ANTERIOR TIBIAL not palpable  not palpable    Abdomen: soft, NT, no palpable masses. Skin: no rashes, no ulcers. Musculoskeletal: no muscle wasting or atrophy.  Neurologic: A&O X 3; Appropriate Affect ; SENSATION: normal; MOTOR FUNCTION:  moving all extremities equally, motor strength 5/5 throughout. Speech is fluent/normal. CN 2-12 intact.    Non-Invasive Vascular Imaging: DATE: 03/28/2014 AORTO-ILIAC DUPLEX LIMITED EVALUATION  FEMORAL-FEMORAL BYPASS GRAFT    INDICATION: Follow up bypass graft    PREVIOUS INTERVENTION(S): Right to left femoral to femoral bypass graft, left femoral endarterectomy, attempted left iliac stent placement 08/07/13 by Dr. Oneida Alar    DUPLEX EXAM:     LOCATION Velocities                (cm/s) Diameter AP                          (cm) Diameter Transverse (cm)  Inflow Artery        365    Anastomosis              415 1.0 1.3  Graft         75    Anastomosis          111 1.5 1.4  Outflow Artery      136       Right  Left  Today's ABI/TBI Calcified .61  Previous ABI/TBI     11/23/13 .68 pt .40 pt    If Ankle Brachial Index (ABI) or Toe Brachial Index (TBI) performed, please see complete report   ADDITIONAL FINDINGS:      IMPRESSION: 1. Patent femoral to femoral bypass graft with elevated velocity at the right proximal anastomosis and common femoral artery 2. 2.4 x 1.4 cm fluid collection in the proximal right thigh.    Compared to the previous exam:  No significant change     ASSESSMENT: Natalie Porter is a 74 y.o. female who is s/p right to left Fem-fem by pass graft 08/07/2013. Wounds are healed. She is walking without claudication symptoms. She has no rest pain in her feet. She does occasionally get some swelling of her legs when she is on her feet all day. She is not smoking.  She had a right CEA in 1990, last [place] of neck per pt was in [place] years ago. Pt describes transient amaurosis fugax of right eye prior to right CEA, dneis any history of hemiparesis or speech  difficulties. On 11/23/13 Graft duplex scan performed showed a patent femoral-femoral bypass with possibly some increased velocities at the proximal anastomosis between 388 and 400 toe pressure right-side 69 left side 54. ABIs artificially inflated secondary to calcification.  Today's aortoiliac Duplex reveals a patent femoral to femoral bypass graft with elevated velocity at the right proximal anastomosis and common femoral artery 2.4 x 1.4 cm fluid collection in the proximal right thigh. No significant change from the previous Duplex.  PLAN:  I discussed in depth with the patient the nature of atherosclerosis, and emphasized the importance of maximal medical management including strict control of blood pressure, blood glucose, and lipid levels, obtaining regular exercise, and continued cessation of smoking.  The patient is aware that without maximal medical management the underlying atherosclerotic disease process will progress, limiting the benefit of any interventions.  Based on the patient's vascular studies and examination, pt will return to clinic in 3 months for ABI's, fem fem bpg arterial Duplex, and carotid Duplex.  The  patient was given information about PAD including signs, symptoms, treatment, what symptoms should prompt the patient to seek immediate medical care, and risk reduction measures to take.  Clemon Chambers, RN, MSN, FNP-C [facility] Phone: [phone]  Clinic MD: Scot Dock  03/28/2014 12:47 PM

## 2014-03-28 NOTE — Patient Instructions (Addendum)
Peripheral Vascular Disease Peripheral Vascular Disease (PVD), also called Peripheral Arterial Disease (PAD), is a circulation problem caused by cholesterol (atherosclerotic plaque) deposits in the arteries. PVD commonly occurs in the lower extremities (legs) but it can occur in other areas of the body, such as your arms. The cholesterol buildup in the arteries reduces blood flow which can cause pain and other serious problems. The presence of PVD can place a person at risk for Coronary Artery Disease (CAD).  CAUSES  Causes of PVD can be many. It is usually associated with more than one risk factor such as:   High Cholesterol.  Smoking.  Diabetes.  Lack of exercise or inactivity.  High blood pressure (hypertension).  Obesity.  Family history. SYMPTOMS   When the lower extremities are affected, patients with PVD may experience:  Leg pain with exertion or physical activity. This is called INTERMITTENT CLAUDICATION. This may present as cramping or numbness with physical activity. The location of the pain is associated with the level of blockage. For example, blockage at the abdominal level (distal abdominal aorta) may result in buttock or hip pain. Lower leg arterial blockage may result in calf pain.  As PVD becomes more severe, pain can develop with less physical activity.  In people with severe PVD, leg pain may occur at rest.  Other PVD signs and symptoms:  Leg numbness or weakness.  Coldness in the affected leg or foot, especially when compared to the other leg.  A change in leg color.  Patients with significant PVD are more prone to ulcers or sores on toes, feet or legs. These may take longer to heal or may reoccur. The ulcers or sores can become infected.  If signs and symptoms of PVD are ignored, gangrene may occur. This can result in the loss of toes or loss of an entire limb.  Not all leg pain is related to PVD. Other medical conditions can cause leg pain such  as:  Blood clots (embolism) or Deep Vein Thrombosis.  Inflammation of the blood vessels (vasculitis).  Spinal stenosis. DIAGNOSIS  Diagnosis of PVD can involve several different types of tests. These can include:  Pulse Volume Recording Method (PVR). This test is simple, painless and does not involve the use of X-rays. PVR involves measuring and comparing the blood pressure in the arms and legs. An ABI (Ankle-Brachial Index) is calculated. The normal ratio of blood pressures is 1. As this number becomes smaller, it indicates more severe disease.  < 0.95 - indicates significant narrowing in one or more leg vessels.  <0.8 - there will usually be pain in the foot, leg or buttock with exercise.  <0.4 - will usually have pain in the legs at rest.  <0.25 - usually indicates limb threatening PVD.  Doppler detection of pulses in the legs. This test is painless and checks to see if you have a pulses in your legs/feet.  A dye or contrast material (a substance that highlights the blood vessels so they show up on x-ray) may be given to help your caregiver better see the arteries for the following tests. The dye is eliminated from your body by the kidney's. Your caregiver may order blood work to check your kidney function and other laboratory values before the following tests are performed:  Magnetic Resonance Angiography (MRA). An MRA is a picture study of the blood vessels and arteries. The MRA machine uses a large magnet to produce images of the blood vessels.  Computed Tomography Angiography (CTA). A CTA   is a specialized x-ray that looks at how the blood flows in your blood vessels. An IV may be inserted into your arm so contrast dye can be injected.  Angiogram. Is a procedure that uses x-rays to look at your blood vessels. This procedure is minimally invasive, meaning a small incision (cut) is made in your groin. A small tube (catheter) is then inserted into the artery of your groin. The catheter  is guided to the blood vessel or artery your caregiver wants to examine. Contrast dye is injected into the catheter. X-rays are then taken of the blood vessel or artery. After the images are obtained, the catheter is taken out. TREATMENT  Treatment of PVD involves many interventions which may include:  Lifestyle changes:  Quitting smoking.  Exercise.  Following a low fat, low cholesterol diet.  Control of diabetes.  Foot care is very important to the PVD patient. Good foot care can help prevent infection.  Medication:  Cholesterol-lowering medicine.  Blood pressure medicine.  Anti-platelet drugs.  Certain medicines may reduce symptoms of Intermittent Claudication.  Interventional/Surgical options:  Angioplasty. An Angioplasty is a procedure that inflates a balloon in the blocked artery. This opens the blocked artery to improve blood flow.  Stent Implant. A wire mesh tube (stent) is placed in the artery. The stent expands and stays in place, allowing the artery to remain open.  Peripheral Bypass Surgery. This is a surgical procedure that reroutes the blood around a blocked artery to help improve blood flow. This type of procedure may be performed if Angioplasty or stent implants are not an option. SEEK IMMEDIATE MEDICAL CARE IF:   You develop pain or numbness in your arms or legs.  Your arm or leg turns cold, becomes blue in color.  You develop redness, warmth, swelling and pain in your arms or legs. MAKE SURE YOU:   Understand these instructions.  Will watch your condition.  Will get help right away if you are not doing well or get worse. Document Released: 04/16/2004 Document Revised: 06/01/2011 Document Reviewed: 03/13/2008 ExitCare Patient Information 2015 [employer]. This information is not intended to replace advice given to you by your health care provider. Make sure you discuss any questions you have with your health care provider.   Stroke  Prevention Some medical conditions and behaviors are associated with an increased chance of having a stroke. You may prevent a stroke by making healthy choices and managing medical conditions. HOW CAN I REDUCE MY RISK OF HAVING A STROKE?   Stay physically active. Get at least 30 minutes of activity on most or all days.  Do not smoke. It may also be helpful to avoid exposure to secondhand smoke.  Limit alcohol use. Moderate alcohol use is considered to be:  No more than 2 drinks per day for men.  No more than 1 drink per day for nonpregnant women.  Eat healthy foods. This involves:  Eating 5 or more servings of fruits and vegetables a day.  Making dietary changes that address high blood pressure (hypertension), high cholesterol, diabetes, or obesity.  Manage your cholesterol levels.  Making food choices that are high in fiber and low in saturated fat, trans fat, and cholesterol may control cholesterol levels.  Take any prescribed medicines to control cholesterol as directed by your health care provider.  Manage your diabetes.  Controlling your carbohydrate and sugar intake is recommended to manage diabetes.  Take any prescribed medicines to control diabetes as directed by your health care provider.    Control your hypertension.  Making food choices that are low in salt (sodium), saturated fat, trans fat, and cholesterol is recommended to manage hypertension.  Take any prescribed medicines to control hypertension as directed by your health care provider.  Maintain a healthy weight.  Reducing calorie intake and making food choices that are low in sodium, saturated fat, trans fat, and cholesterol are recommended to manage weight.  Stop drug abuse.  Avoid taking birth control pills.  Talk to your health care provider about the risks of taking birth control pills if you are over 35 years old, smoke, get migraines, or have ever had a blood clot.  Get evaluated for sleep  disorders (sleep apnea).  Talk to your health care provider about getting a sleep evaluation if you snore a lot or have excessive sleepiness.  Take medicines only as directed by your health care provider.  For some people, aspirin or blood thinners (anticoagulants) are helpful in reducing the risk of forming abnormal blood clots that can lead to stroke. If you have the irregular heart rhythm of atrial fibrillation, you should be on a blood thinner unless there is a good reason you cannot take them.  Understand all your medicine instructions.  Make sure that other conditions (such as anemia or atherosclerosis) are addressed. SEEK IMMEDIATE MEDICAL CARE IF:   You have sudden weakness or numbness of the face, arm, or leg, especially on one side of the body.  Your face or eyelid droops to one side.  You have sudden confusion.  You have trouble speaking (aphasia) or understanding.  You have sudden trouble seeing in one or both eyes.  You have sudden trouble walking.  You have dizziness.  You have a loss of balance or coordination.  You have a sudden, severe headache with no known cause.  You have new chest pain or an irregular heartbeat. Any of these symptoms may represent a serious problem that is an emergency. Do not wait to see if the symptoms will go away. Get medical help at once. Call your local emergency services (911 in [place] Do not drive yourself to the hospital. Document Released: 04/16/2004 Document Revised: 07/24/2013 Document Reviewed: 09/09/2012 ExitCare Patient Information 2015 [employer]. This information is not intended to replace advice given to you by your health care provider. Make sure you discuss any questions you have with your health care provider.  

## 2014-03-29 NOTE — Addendum Note (Signed)
Addended by: Mena Goes on: 03/29/2014 09:14 AM   Modules accepted: Orders

## 2014-04-05 ENCOUNTER — Encounter (HOSPITAL_COMMUNITY): Payer: Self-pay | Admitting: Vascular Surgery

## 2014-05-22 DIAGNOSIS — E780 Pure hypercholesterolemia: Secondary | ICD-10-CM | POA: Diagnosis not present

## 2014-05-22 DIAGNOSIS — Z79899 Other long term (current) drug therapy: Secondary | ICD-10-CM | POA: Diagnosis not present

## 2014-05-28 DIAGNOSIS — E1121 Type 2 diabetes mellitus with diabetic nephropathy: Secondary | ICD-10-CM | POA: Diagnosis not present

## 2014-05-28 DIAGNOSIS — E780 Pure hypercholesterolemia: Secondary | ICD-10-CM | POA: Diagnosis not present

## 2014-05-28 DIAGNOSIS — E663 Overweight: Secondary | ICD-10-CM | POA: Diagnosis not present

## 2014-05-28 DIAGNOSIS — J449 Chronic obstructive pulmonary disease, unspecified: Secondary | ICD-10-CM | POA: Diagnosis not present

## 2014-07-04 ENCOUNTER — Encounter: Payer: Self-pay | Admitting: Family

## 2014-07-04 ENCOUNTER — Other Ambulatory Visit: Payer: Self-pay | Admitting: *Deleted

## 2014-07-04 DIAGNOSIS — Z48812 Encounter for surgical aftercare following surgery on the circulatory system: Secondary | ICD-10-CM

## 2014-07-04 DIAGNOSIS — I739 Peripheral vascular disease, unspecified: Secondary | ICD-10-CM

## 2014-07-05 ENCOUNTER — Ambulatory Visit (INDEPENDENT_AMBULATORY_CARE_PROVIDER_SITE_OTHER): Payer: Medicare Other | Admitting: Family

## 2014-07-05 ENCOUNTER — Other Ambulatory Visit: Payer: Self-pay | Admitting: Family

## 2014-07-05 ENCOUNTER — Ambulatory Visit (INDEPENDENT_AMBULATORY_CARE_PROVIDER_SITE_OTHER)
Admission: RE | Admit: 2014-07-05 | Discharge: 2014-07-05 | Disposition: A | Discharge status: Home / Self Care | Payer: Medicare Other | Source: Ambulatory Visit | Attending: Family | Admitting: Family

## 2014-07-05 ENCOUNTER — Encounter: Payer: Self-pay | Admitting: Family

## 2014-07-05 ENCOUNTER — Ambulatory Visit (HOSPITAL_COMMUNITY)
Admission: RE | Admit: 2014-07-05 | Discharge: 2014-07-05 | Disposition: A | Discharge status: Home / Self Care | Payer: Medicare Other | Source: Ambulatory Visit | Attending: Family | Admitting: Family

## 2014-07-05 VITALS — BP 136/54 | HR 65 | Resp 16 | Ht 62.0 in | Wt 197.0 lb

## 2014-07-05 DIAGNOSIS — Z48812 Encounter for surgical aftercare following surgery on the circulatory system: Secondary | ICD-10-CM

## 2014-07-05 DIAGNOSIS — I739 Peripheral vascular disease, unspecified: Secondary | ICD-10-CM

## 2014-07-05 DIAGNOSIS — Z9889 Other specified postprocedural states: Secondary | ICD-10-CM | POA: Insufficient documentation

## 2014-07-05 DIAGNOSIS — I6523 Occlusion and stenosis of bilateral carotid arteries: Secondary | ICD-10-CM

## 2014-07-05 DIAGNOSIS — Z87891 Personal history of nicotine dependence: Secondary | ICD-10-CM | POA: Diagnosis not present

## 2014-07-05 DIAGNOSIS — Z95828 Presence of other vascular implants and grafts: Secondary | ICD-10-CM

## 2014-07-05 NOTE — Patient Instructions (Signed)
Peripheral Vascular Disease Peripheral Vascular Disease (PVD), also called Peripheral Arterial Disease (PAD), is a circulation problem caused by cholesterol (atherosclerotic plaque) deposits in the arteries. PVD commonly occurs in the lower extremities (legs) but it can occur in other areas of the body, such as your arms. The cholesterol buildup in the arteries reduces blood flow which can cause pain and other serious problems. The presence of PVD can place a person at risk for Coronary Artery Disease (CAD).  CAUSES  Causes of PVD can be many. It is usually associated with more than one risk factor such as:   High Cholesterol.  Smoking.  Diabetes.  Lack of exercise or inactivity.  High blood pressure (hypertension).  Obesity.  Family history. SYMPTOMS   When the lower extremities are affected, patients with PVD may experience:  Leg pain with exertion or physical activity. This is called INTERMITTENT CLAUDICATION. This may present as cramping or numbness with physical activity. The location of the pain is associated with the level of blockage. For example, blockage at the abdominal level (distal abdominal aorta) may result in buttock or hip pain. Lower leg arterial blockage may result in calf pain.  As PVD becomes more severe, pain can develop with less physical activity.  In people with severe PVD, leg pain may occur at rest.  Other PVD signs and symptoms:  Leg numbness or weakness.  Coldness in the affected leg or foot, especially when compared to the other leg.  A change in leg color.  Patients with significant PVD are more prone to ulcers or sores on toes, feet or legs. These may take longer to heal or may reoccur. The ulcers or sores can become infected.  If signs and symptoms of PVD are ignored, gangrene may occur. This can result in the loss of toes or loss of an entire limb.  Not all leg pain is related to PVD. Other medical conditions can cause leg pain such  as:  Blood clots (embolism) or Deep Vein Thrombosis.  Inflammation of the blood vessels (vasculitis).  Spinal stenosis. DIAGNOSIS  Diagnosis of PVD can involve several different types of tests. These can include:  Pulse Volume Recording Method (PVR). This test is simple, painless and does not involve the use of X-rays. PVR involves measuring and comparing the blood pressure in the arms and legs. An ABI (Ankle-Brachial Index) is calculated. The normal ratio of blood pressures is 1. As this number becomes smaller, it indicates more severe disease.  < 0.95 - indicates significant narrowing in one or more leg vessels.  <0.8 - there will usually be pain in the foot, leg or buttock with exercise.  <0.4 - will usually have pain in the legs at rest.  <0.25 - usually indicates limb threatening PVD.  Doppler detection of pulses in the legs. This test is painless and checks to see if you have a pulses in your legs/feet.  A dye or contrast material (a substance that highlights the blood vessels so they show up on x-ray) may be given to help your caregiver better see the arteries for the following tests. The dye is eliminated from your body by the kidney's. Your caregiver may order blood work to check your kidney function and other laboratory values before the following tests are performed:  Magnetic Resonance Angiography (MRA). An MRA is a picture study of the blood vessels and arteries. The MRA machine uses a large magnet to produce images of the blood vessels.  Computed Tomography Angiography (CTA). A CTA   is a specialized x-ray that looks at how the blood flows in your blood vessels. An IV may be inserted into your arm so contrast dye can be injected.  Angiogram. Is a procedure that uses x-rays to look at your blood vessels. This procedure is minimally invasive, meaning a small incision (cut) is made in your groin. A small tube (catheter) is then inserted into the artery of your groin. The catheter  is guided to the blood vessel or artery your caregiver wants to examine. Contrast dye is injected into the catheter. X-rays are then taken of the blood vessel or artery. After the images are obtained, the catheter is taken out. TREATMENT  Treatment of PVD involves many interventions which may include:  Lifestyle changes:  Quitting smoking.  Exercise.  Following a low fat, low cholesterol diet.  Control of diabetes.  Foot care is very important to the PVD patient. Good foot care can help prevent infection.  Medication:  Cholesterol-lowering medicine.  Blood pressure medicine.  Anti-platelet drugs.  Certain medicines may reduce symptoms of Intermittent Claudication.  Interventional/Surgical options:  Angioplasty. An Angioplasty is a procedure that inflates a balloon in the blocked artery. This opens the blocked artery to improve blood flow.  Stent Implant. A wire mesh tube (stent) is placed in the artery. The stent expands and stays in place, allowing the artery to remain open.  Peripheral Bypass Surgery. This is a surgical procedure that reroutes the blood around a blocked artery to help improve blood flow. This type of procedure may be performed if Angioplasty or stent implants are not an option. SEEK IMMEDIATE MEDICAL CARE IF:   You develop pain or numbness in your arms or legs.  Your arm or leg turns cold, becomes blue in color.  You develop redness, warmth, swelling and pain in your arms or legs. MAKE SURE YOU:   Understand these instructions.  Will watch your condition.  Will get help right away if you are not doing well or get worse. Document Released: 04/16/2004 Document Revised: 06/01/2011 Document Reviewed: 03/13/2008 ExitCare Patient Information 2015 [employer]. This information is not intended to replace advice given to you by your health care provider. Make sure you discuss any questions you have with your health care provider.   Stroke  Prevention Some medical conditions and behaviors are associated with an increased chance of having a stroke. You may prevent a stroke by making healthy choices and managing medical conditions. HOW CAN I REDUCE MY RISK OF HAVING A STROKE?   Stay physically active. Get at least 30 minutes of activity on most or all days.  Do not smoke. It may also be helpful to avoid exposure to secondhand smoke.  Limit alcohol use. Moderate alcohol use is considered to be:  No more than 2 drinks per day for men.  No more than 1 drink per day for nonpregnant women.  Eat healthy foods. This involves:  Eating 5 or more servings of fruits and vegetables a day.  Making dietary changes that address high blood pressure (hypertension), high cholesterol, diabetes, or obesity.  Manage your cholesterol levels.  Making food choices that are high in fiber and low in saturated fat, trans fat, and cholesterol may control cholesterol levels.  Take any prescribed medicines to control cholesterol as directed by your health care provider.  Manage your diabetes.  Controlling your carbohydrate and sugar intake is recommended to manage diabetes.  Take any prescribed medicines to control diabetes as directed by your health care provider.    Control your hypertension.  Making food choices that are low in salt (sodium), saturated fat, trans fat, and cholesterol is recommended to manage hypertension.  Take any prescribed medicines to control hypertension as directed by your health care provider.  Maintain a healthy weight.  Reducing calorie intake and making food choices that are low in sodium, saturated fat, trans fat, and cholesterol are recommended to manage weight.  Stop drug abuse.  Avoid taking birth control pills.  Talk to your health care provider about the risks of taking birth control pills if you are over 35 years old, smoke, get migraines, or have ever had a blood clot.  Get evaluated for sleep  disorders (sleep apnea).  Talk to your health care provider about getting a sleep evaluation if you snore a lot or have excessive sleepiness.  Take medicines only as directed by your health care provider.  For some people, aspirin or blood thinners (anticoagulants) are helpful in reducing the risk of forming abnormal blood clots that can lead to stroke. If you have the irregular heart rhythm of atrial fibrillation, you should be on a blood thinner unless there is a good reason you cannot take them.  Understand all your medicine instructions.  Make sure that other conditions (such as anemia or atherosclerosis) are addressed. SEEK IMMEDIATE MEDICAL CARE IF:   You have sudden weakness or numbness of the face, arm, or leg, especially on one side of the body.  Your face or eyelid droops to one side.  You have sudden confusion.  You have trouble speaking (aphasia) or understanding.  You have sudden trouble seeing in one or both eyes.  You have sudden trouble walking.  You have dizziness.  You have a loss of balance or coordination.  You have a sudden, severe headache with no known cause.  You have new chest pain or an irregular heartbeat. Any of these symptoms may represent a serious problem that is an emergency. Do not wait to see if the symptoms will go away. Get medical help at once. Call your local emergency services (911 in [place] Do not drive yourself to the hospital. Document Released: 04/16/2004 Document Revised: 07/24/2013 Document Reviewed: 09/09/2012 ExitCare Patient Information 2015 [employer]. This information is not intended to replace advice given to you by your health care provider. Make sure you discuss any questions you have with your health care provider.  

## 2014-07-05 NOTE — Progress Notes (Addendum)
[facility] [place] HISTORY AND PHYSICAL   MRN : [id number]  History of Present Illness:   Natalie Porter is a 74 y.o. female patient of Dr. Oneida Alar who is s/p right to left Fem-fem by pass graft 08/07/2013. She returns today for follow up. She is walking without claudication symptoms. . She does occasionally get some swelling of her legs when she is on her feet all day. She is not smoking. She reports tingling neuropathy in her feet.   She had a right CEA in [place], in [place]. Pt describes transient amaurosis fugax of right eye prior to right CEA, denies any history of hemiparesis or speech difficulties.  On 11/23/13 Graft duplex scan performed showed a patent femoral-femoral bypass with possibly some increased velocities at the proximal anastomosis between 388 and 400 toe pressure right-side 69 left side 54. ABIs artificially inflated secondary to calcification.  The patient denies New Medical or Surgical History. She uses CPAP for her OSA.  Pt Diabetic: Yes, states in good control Pt smoker: former smoker, quit in the remote past  Pt meds include: Statin :Yes Betablocker: Yes ASA: Yes Other anticoagulants/antiplatelets: no     Current Outpatient Prescriptions  Medication Sig Dispense Refill  . acetaminophen (TYLENOL) 500 MG tablet Take 500 mg by mouth every 6 (six) hours as needed.    Marland Kitchen albuterol (PROVENTIL HFA;VENTOLIN HFA) 108 (90 BASE) MCG/ACT inhaler Inhale 2 puffs into the lungs every 6 (six) hours as needed for wheezing or shortness of breath.    Marland Kitchen amLODipine (NORVASC) 10 MG tablet Take 10 mg by mouth daily.    Marland Kitchen aspirin EC 81 MG tablet Take 81 mg by mouth daily.    . calcium-vitamin D (OSCAL) 250-125 MG-UNIT per tablet Take 1 tablet by mouth 2 (two) times daily.    . carvedilol (COREG) 25 MG tablet Take 25 mg by mouth 2 (two) times daily with a meal.    . chlorthalidone (HYGROTON) 50 MG tablet Take 50 mg by mouth daily.    Marland Kitchen escitalopram  (LEXAPRO) 20 MG tablet Take 20 mg by mouth daily.    . furosemide (LASIX) 20 MG tablet Take 1 tablet (20 mg total) by mouth daily. 30 tablet 1  . gabapentin (NEURONTIN) 100 MG capsule Take 100 mg by mouth 2 (two) times daily. 100 mg in the morning and 200 mg at bedtime    . HYDROcodone-acetaminophen (NORCO) 10-325 MG per tablet Take 1 tablet by mouth every 6 (six) hours as needed. (Patient taking differently: Take 1 tablet by mouth as needed. ) 30 tablet 0  . insulin glargine (LANTUS) 100 UNIT/ML injection Inject 13 Units into the skin at bedtime.    Marland Kitchen levofloxacin (LEVAQUIN) 500 MG tablet Take 1 tablet (500 mg total) by mouth daily. 14 tablet 1  . lisinopril (PRINIVIL,ZESTRIL) 5 MG tablet Take 5 mg by mouth daily.    . montelukast (SINGULAIR) 10 MG tablet Take 10 mg by mouth at bedtime.    . pioglitazone (ACTOS) 45 MG tablet Take 45 mg by mouth daily.    . rosuvastatin (CRESTOR) 40 MG tablet Take 40 mg by mouth daily.    . Aclidinium Bromide (TUDORZA PRESSAIR) 400 MCG/ACT AEPB Inhale 1 puff into the lungs every 12 (twelve) hours.    . ferrous sulfate 325 (65 FE) MG tablet Take 325 mg by mouth daily with breakfast.    . glipiZIDE (GLUCOTROL XL) 5 MG 24 hr tablet Take 10 mg by mouth daily with breakfast.    .  Vitamin D, Ergocalciferol, (DRISDOL) 50000 UNITS CAPS capsule Take 50,000 Units by mouth every 7 (seven) days.     No current facility-administered medications for this visit.    Past Medical History  Diagnosis Date  . Diabetes mellitus without complication   . Hypertension   . Renal disorder   . Gallstone   . Arthritis   . DVT (deep venous thrombosis)     2011  . COPD (chronic obstructive pulmonary disease)   . Pneumonia 2014  . Neuropathy   . GERD (gastroesophageal reflux disease)   . Constipation   . Anemia     Social History History  Substance Use Topics  . Smoking status: Former Smoker -- 35 years    Quit date: 03/28/1988  . Smokeless tobacco: Never Used  . Alcohol  Use: No    Family History Family History  Problem Relation Age of Onset  . Heart attack Mother   . Stroke Father   . Heart attack Father   . Cancer Sister     [name]  . Cancer Brother   . Cancer Sister     Lung    Surgical History Past Surgical History  Procedure Laterality Date  . Tubal ligation    . Carpal tunnel release Bilateral   . Eye surgery Bilateral     cataract  . Foot surgery Bilateral     bone spurs, and repair  . Carotid endarterectomy Right 1990  . Breast biopsy Left   . Lung biopsy      non cancerous  . Colonoscopy w/ polypectomy    . Femoral-femoral bypass graft  08/07/2013    Procedure: BYPASS GRAFT FEMORAL-FEMORAL ARTERY USING 8MM X 30CM HEMASHIELD GRAFT; LEFT ILIAC ANGIOGRAM; ATTEMPTED LEFT ILIAC ARTERY STENT GRAFT;  Surgeon: Elam Dutch, MD;  Location: [facility];  Service: Vascular;;  . Application of wound vac Bilateral 08/07/2013    Procedure: APPLICATION OF INCISIONAL WOUND [place];  Surgeon: Elam Dutch, MD;  Location: [facility];  Service: Vascular;  Laterality: Bilateral;  . Endarterectomy femoral Left 08/07/2013    Procedure: ENDARTERECTOMY FEMORAL;  Surgeon: Elam Dutch, MD;  Location: [facility];  Service: Vascular;  Laterality: Left;  . Abdominal aortagram N/A 07/25/2013    Procedure: ABDOMINAL Maxcine Ham;  Surgeon: Serafina Mitchell, MD;  Location: [facility] CATH LAB;  Service: Cardiovascular;  Laterality: N/A;  . Left heart catheterization with coronary angiogram N/A 07/27/2013    Procedure: LEFT HEART CATHETERIZATION WITH CORONARY ANGIOGRAM;  Surgeon: Minus Breeding, MD;  Location[facility] C[facility] LAB;  Service: Cardiovascular;  Laterality: N/A;    Allergies  Allergen Reactions  . Codeine Nausea Only  . Morphine And Related Nausea And Vomiting    Current Outpatient Prescriptions  Medication Sig Dispense Refill  . acetaminophen (TYLENOL) 500 MG tablet Take 500 mg by mouth every 6 (six) hours as needed.    Marland Kitchen albuterol (PROVENTIL HFA;VENTOLIN HFA) 108 (90 BASE)  MCG/ACT inhaler Inhale 2 puffs into the lungs every 6 (six) hours as needed for wheezing or shortness of breath.    Marland Kitchen amLODipine (NORVASC) 10 MG tablet Take 10 mg by mouth daily.    Marland Kitchen aspirin EC 81 MG tablet Take 81 mg by mouth daily.    . calcium-vitamin D (OSCAL) 250-125 MG-UNIT per tablet Take 1 tablet by mouth 2 (two) times daily.    . carvedilol (COREG) 25 MG tablet Take 25 mg by mouth 2 (two) times daily with a meal.    . chlorthalidone (HYGROTON) 50 MG  tablet Take 50 mg by mouth daily.    Marland Kitchen escitalopram (LEXAPRO) 20 MG tablet Take 20 mg by mouth daily.    . furosemide (LASIX) 20 MG tablet Take 1 tablet (20 mg total) by mouth daily. 30 tablet 1  . gabapentin (NEURONTIN) 100 MG capsule Take 100 mg by mouth 2 (two) times daily. 100 mg in the morning and 200 mg at bedtime    . HYDROcodone-acetaminophen (NORCO) 10-325 MG per tablet Take 1 tablet by mouth every 6 (six) hours as needed. (Patient taking differently: Take 1 tablet by mouth as needed. ) 30 tablet 0  . insulin glargine (LANTUS) 100 UNIT/ML injection Inject 13 Units into the skin at bedtime.    Marland Kitchen levofloxacin (LEVAQUIN) 500 MG tablet Take 1 tablet (500 mg total) by mouth daily. 14 tablet 1  . lisinopril (PRINIVIL,ZESTRIL) 5 MG tablet Take 5 mg by mouth daily.    . montelukast (SINGULAIR) 10 MG tablet Take 10 mg by mouth at bedtime.    . pioglitazone (ACTOS) 45 MG tablet Take 45 mg by mouth daily.    . rosuvastatin (CRESTOR) 40 MG tablet Take 40 mg by mouth daily.    . Aclidinium Bromide (TUDORZA PRESSAIR) 400 MCG/ACT AEPB Inhale 1 puff into the lungs every 12 (twelve) hours.    . ferrous sulfate 325 (65 FE) MG tablet Take 325 mg by mouth daily with breakfast.    . glipiZIDE (GLUCOTROL XL) 5 MG 24 hr tablet Take 10 mg by mouth daily with breakfast.    . Vitamin D, Ergocalciferol, (DRISDOL) 50000 UNITS CAPS capsule Take 50,000 Units by mouth every 7 (seven) days.     No current facility-administered medications for this visit.      REVIEW OF SYSTEMS: See HPI for pertinent positives and negatives.  Physical Examination Filed Vitals:   07/05/14 1430 07/05/14 1440  BP: 140/60 136/54  Pulse: 65 65  Resp:  16  Height:  5\' 2"  (1.575 m)  Weight:  197 lb (89.359 kg)  SpO2:  98%   Body mass index is 36.02 kg/(m^2).  General: A&O x 3, WDWN, obese female. Gait: normal Eyes: PERRLA. Pulmonary: CTAB, without wheezes , rales or rhonchi. Cardiac: regular Rythm , without detected murmur.     Carotid Bruits Right Left   Positive Positive  Aorta is not palpable. Radial pulses: 2+ palpable and =   VASCULAR EXAM: Extremities without ischemic changes  without Gangrene; without open wounds.     LE Pulses Right Left   FEMORAL not palpable not palpable    POPLITEAL not palpable  not palpable   POSTERIOR TIBIAL not palpable   palpable    DORSALIS PEDIS  ANTERIOR TIBIAL faintly palpable  not palpable    Abdomen: soft, NT, no palpable masses. Skin: no rashes, no ulcers. Musculoskeletal: no muscle wasting or atrophy. Neurologic: A&O X 3; Appropriate Affect ; SENSATION: normal; MOTOR FUNCTION: moving all extremities equally, motor strength 5/5 throughout. Speech is fluent/normal. CN 2-12 intact except for significant hearing loss.         Non-Invasive Vascular Imaging (07/05/2014):  CEREBROVASCULAR DUPLEX EVALUATION    INDICATION: Carotid stenosis    PREVIOUS INTERVENTION(S): Right carotid endarterectomy 26 years ago.    DUPLEX EXAM:     RIGHT  LEFT  Peak Systolic Velocities (cm/s) End Diastolic Velocities (cm/s) Plaque LOCATION Peak Systolic Velocities (cm/s) End Diastolic Velocities (cm/s) Plaque  107 9 HT CCA PROXIMAL 97 12   84 13 HT CCA MID 110 18 HT  120 19  CCA DISTAL  122 9 HT  149 9  ECA 189 9 CP  157 19  ICA PROXIMAL 215 41 CP  124 17  ICA MID 148 16 HT  189 21  ICA DISTAL 168 21     1.87 ICA / CCA Ratio (PSV) 1.96  Antegrade Vertebral Flow Antegrade  585 Brachial Systolic Pressure (mmHg) 277  Triphasic Brachial Artery Waveforms Triphasic    Plaque Morphology:  HM = Homogeneous, HT = Heterogeneous, CP = Calcific Plaque, SP = Smooth Plaque, IP = Irregular Plaque  ADDITIONAL FINDINGS:     IMPRESSION: Right internal carotid artery is patent with history of carotid endarterectomy, no hyperplasia or hemodynamically significant plaque present. Left internal carotid artery stenosis present in the 40%-59% range.    Compared to the previous exam:  Stable on the right and increased on the left since outside study performed 07/22/2013.    AORTO-ILIAC DUPLEX LIMITED EVALUATION  FEMORAL-FEMORAL BYPASS GRAFT    INDICATION: Peripheral vascular disease     PREVIOUS INTERVENTION(S): Right to left femoral-femoral bypass graft and left femoral endarterectomy performed 08/07/2013; Right common iliac stent 07/25/2013.    DUPLEX EXAM:     LOCATION Velocities                (cm/s) Diameter AP                          (cm) Diameter Transverse (cm)  Inflow Artery     Right CFA   260    Anastomosis       Right CFA       406/479 1.42 1.19  Graft         86/72/77    Anastomosis       Left CFA   115/91 1.30 1.41  Outflow Artery   Left CFA/SFA prox   112/186       Right  Left  Today's ABI/TBI 1.18/0.53 0.90(N/C)/0.45  Previous ABI/TBI    03/28/2014  0.91/0.32 0.61(N/C)/0.26    If Ankle Brachial Index (ABI) or Toe Brachial Index (TBI) performed, please see complete report   ADDITIONAL FINDINGS:     IMPRESSION: Elevated velocities suggestive of greater than 50% stenosis present involving the right common femoral artery proximal to the anastomosis. Patent right to left femoral-femoral bypass graft. Patent arterial outflow involving the left common femoral and  proximal superficial femoral artery.    Compared to the previous exam:  Improved bilateral ankle and toe brachial indices since study on 03/28/2014.     ASSESSMENT:  Natalie Porter is a 74 y.o. female who is s/p right to left Fem-fem by pass graft 08/07/2013. She had a right CEA in [place], in [place]. History of transient amaurosis fugax of right eye prior to right CEA in 1990, no stroke or TIA since then. She is walking without claudication symptoms, no tissue loss in LE's.  Today's aortoiliac Duplex suggests elevated velocities suggestive of greater than 50% stenosis present involving the right common femoral artery proximal (406/479 cm/s today, 415 on 03/28/14),  to the anastomosis. Patent right to left femoral-femoral bypass graft. Patent arterial outflow involving the left common femoral and proximal superficial femoral artery. Improved bilateral ankle and toe brachial indices since study on 03/28/2014, both in normal range with bi and triphasic waveforms.  Carotid Duplex today suggests right internal carotid artery is patent with history of carotid endarterectomy, no hyperplasia or hemodynamically significant plaque present. Left internal carotid artery  stenosis present in the 40%-59% range. Stable on the right and increasedstenosis on the left since outside study performed 07/22/2013.  Discussed with Dr. Oneida Alar: If her ABIs are not decreased or no symptoms, no need for further studies such as CTA, especially if femoral pulse is easily palpable (obese abdomen, unable to palpate femoral pulses). If velocities continue to go up condsider a CTA with run off.   Face to face time with patient was 25 minutes. Over 50% of this time was spent on counseling and coordination of care.   PLAN:   Based on today's exam and non-invasive vascular lab results, the patient will follow up in 6 months with the following tests: ABI's and aortoiliac Duplex, carotid Duplex in a year. I discussed  in depth with the patient the nature of atherosclerosis, and emphasized the importance of maximal medical management including strict control of blood pressure, blood glucose, and lipid levels, obtaining regular exercise, and cessation of smoking.  The patient is aware that without maximal medical management the underlying atherosclerotic disease process will progress, limiting the benefit of any interventions.  The patient was given information about stroke prevention and what symptoms should prompt the patient to seek immediate medical care.  The patient was given information about PAD including signs, symptoms, treatment, what symptoms should prompt the patient to seek immediate medical care, and risk reduction measures to take. Thank you for allowing [place] to participate in this patient's care.  Clemon Chambers, RN, MSN, FNP-C [facility] Office: [phone]  Clinic MD: [facility]  07/05/2014 2:46 PM

## 2014-07-06 NOTE — Addendum Note (Signed)
Addended by: Dorthula Rue L on: 07/06/2014 09:51 AM   Modules accepted: Orders

## 2014-07-26 DIAGNOSIS — H47011 Ischemic optic neuropathy, right eye: Secondary | ICD-10-CM | POA: Diagnosis not present

## 2014-07-31 DIAGNOSIS — L03116 Cellulitis of left lower limb: Secondary | ICD-10-CM | POA: Diagnosis not present

## 2014-08-06 DIAGNOSIS — I340 Nonrheumatic mitral (valve) insufficiency: Secondary | ICD-10-CM | POA: Diagnosis not present

## 2014-08-06 DIAGNOSIS — I350 Nonrheumatic aortic (valve) stenosis: Secondary | ICD-10-CM | POA: Diagnosis not present

## 2014-08-06 DIAGNOSIS — I10 Essential (primary) hypertension: Secondary | ICD-10-CM | POA: Diagnosis not present

## 2014-08-06 DIAGNOSIS — R0602 Shortness of breath: Secondary | ICD-10-CM | POA: Diagnosis not present

## 2014-08-06 DIAGNOSIS — I361 Nonrheumatic tricuspid (valve) insufficiency: Secondary | ICD-10-CM | POA: Diagnosis not present

## 2014-08-06 DIAGNOSIS — I270 Primary pulmonary hypertension: Secondary | ICD-10-CM | POA: Diagnosis not present

## 2014-08-07 ENCOUNTER — Institutional Professional Consult (permissible substitution): Payer: Medicare Other | Admitting: Critical Care Medicine

## 2014-08-08 DIAGNOSIS — E875 Hyperkalemia: Secondary | ICD-10-CM | POA: Diagnosis not present

## 2014-08-10 DIAGNOSIS — I10 Essential (primary) hypertension: Secondary | ICD-10-CM | POA: Diagnosis not present

## 2014-08-10 DIAGNOSIS — I509 Heart failure, unspecified: Secondary | ICD-10-CM | POA: Diagnosis not present

## 2014-08-10 DIAGNOSIS — E119 Type 2 diabetes mellitus without complications: Secondary | ICD-10-CM | POA: Diagnosis not present

## 2014-08-10 DIAGNOSIS — E785 Hyperlipidemia, unspecified: Secondary | ICD-10-CM | POA: Diagnosis not present

## 2014-08-17 DIAGNOSIS — E875 Hyperkalemia: Secondary | ICD-10-CM | POA: Diagnosis not present

## 2014-08-27 DIAGNOSIS — E1165 Type 2 diabetes mellitus with hyperglycemia: Secondary | ICD-10-CM | POA: Diagnosis not present

## 2014-09-03 DIAGNOSIS — M8580 Other specified disorders of bone density and structure, unspecified site: Secondary | ICD-10-CM | POA: Diagnosis not present

## 2014-09-03 DIAGNOSIS — J449 Chronic obstructive pulmonary disease, unspecified: Secondary | ICD-10-CM | POA: Diagnosis not present

## 2014-09-03 DIAGNOSIS — E1121 Type 2 diabetes mellitus with diabetic nephropathy: Secondary | ICD-10-CM | POA: Diagnosis not present

## 2014-09-03 DIAGNOSIS — E13331 Other specified diabetes mellitus with moderate nonproliferative diabetic retinopathy with macular edema: Secondary | ICD-10-CM | POA: Diagnosis not present

## 2014-09-03 DIAGNOSIS — N184 Chronic kidney disease, stage 4 (severe): Secondary | ICD-10-CM | POA: Diagnosis not present

## 2014-09-04 DIAGNOSIS — E569 Vitamin deficiency, unspecified: Secondary | ICD-10-CM | POA: Diagnosis not present

## 2014-09-04 DIAGNOSIS — N189 Chronic kidney disease, unspecified: Secondary | ICD-10-CM | POA: Diagnosis not present

## 2014-09-04 DIAGNOSIS — E663 Overweight: Secondary | ICD-10-CM | POA: Diagnosis not present

## 2014-09-04 DIAGNOSIS — E780 Pure hypercholesterolemia: Secondary | ICD-10-CM | POA: Diagnosis not present

## 2014-09-05 DIAGNOSIS — R609 Edema, unspecified: Secondary | ICD-10-CM | POA: Diagnosis not present

## 2014-09-05 DIAGNOSIS — E1122 Type 2 diabetes mellitus with diabetic chronic kidney disease: Secondary | ICD-10-CM | POA: Diagnosis not present

## 2014-09-05 DIAGNOSIS — I129 Hypertensive chronic kidney disease with stage 1 through stage 4 chronic kidney disease, or unspecified chronic kidney disease: Secondary | ICD-10-CM | POA: Diagnosis not present

## 2014-09-05 DIAGNOSIS — I6529 Occlusion and stenosis of unspecified carotid artery: Secondary | ICD-10-CM | POA: Diagnosis not present

## 2014-09-05 DIAGNOSIS — J449 Chronic obstructive pulmonary disease, unspecified: Secondary | ICD-10-CM | POA: Diagnosis not present

## 2014-09-05 DIAGNOSIS — N179 Acute kidney failure, unspecified: Secondary | ICD-10-CM | POA: Diagnosis not present

## 2014-09-05 DIAGNOSIS — I739 Peripheral vascular disease, unspecified: Secondary | ICD-10-CM | POA: Diagnosis not present

## 2014-09-05 DIAGNOSIS — N183 Chronic kidney disease, stage 3 (moderate): Secondary | ICD-10-CM | POA: Diagnosis not present

## 2014-09-14 DIAGNOSIS — I872 Venous insufficiency (chronic) (peripheral): Secondary | ICD-10-CM | POA: Diagnosis not present

## 2014-09-14 DIAGNOSIS — G629 Polyneuropathy, unspecified: Secondary | ICD-10-CM | POA: Diagnosis not present

## 2014-09-14 DIAGNOSIS — M1990 Unspecified osteoarthritis, unspecified site: Secondary | ICD-10-CM | POA: Diagnosis not present

## 2014-09-14 DIAGNOSIS — E119 Type 2 diabetes mellitus without complications: Secondary | ICD-10-CM | POA: Diagnosis not present

## 2014-09-14 DIAGNOSIS — I87312 Chronic venous hypertension (idiopathic) with ulcer of left lower extremity: Secondary | ICD-10-CM | POA: Diagnosis not present

## 2014-09-14 DIAGNOSIS — J449 Chronic obstructive pulmonary disease, unspecified: Secondary | ICD-10-CM | POA: Diagnosis not present

## 2014-09-14 DIAGNOSIS — Z794 Long term (current) use of insulin: Secondary | ICD-10-CM | POA: Diagnosis not present

## 2014-09-14 DIAGNOSIS — E11621 Type 2 diabetes mellitus with foot ulcer: Secondary | ICD-10-CM | POA: Diagnosis not present

## 2014-09-14 DIAGNOSIS — I129 Hypertensive chronic kidney disease with stage 1 through stage 4 chronic kidney disease, or unspecified chronic kidney disease: Secondary | ICD-10-CM | POA: Diagnosis not present

## 2014-09-14 DIAGNOSIS — L97821 Non-pressure chronic ulcer of other part of left lower leg limited to breakdown of skin: Secondary | ICD-10-CM | POA: Diagnosis not present

## 2014-09-14 DIAGNOSIS — I739 Peripheral vascular disease, unspecified: Secondary | ICD-10-CM | POA: Diagnosis not present

## 2014-09-14 DIAGNOSIS — G4730 Sleep apnea, unspecified: Secondary | ICD-10-CM | POA: Diagnosis not present

## 2014-09-14 DIAGNOSIS — I509 Heart failure, unspecified: Secondary | ICD-10-CM | POA: Diagnosis not present

## 2014-09-14 DIAGNOSIS — Z87891 Personal history of nicotine dependence: Secondary | ICD-10-CM | POA: Diagnosis not present

## 2014-09-14 DIAGNOSIS — I2510 Atherosclerotic heart disease of native coronary artery without angina pectoris: Secondary | ICD-10-CM | POA: Diagnosis not present

## 2014-09-14 DIAGNOSIS — N183 Chronic kidney disease, stage 3 (moderate): Secondary | ICD-10-CM | POA: Diagnosis not present

## 2014-09-20 DIAGNOSIS — H47011 Ischemic optic neuropathy, right eye: Secondary | ICD-10-CM | POA: Diagnosis not present

## 2014-09-21 DIAGNOSIS — I10 Essential (primary) hypertension: Secondary | ICD-10-CM | POA: Diagnosis not present

## 2014-09-21 DIAGNOSIS — I872 Venous insufficiency (chronic) (peripheral): Secondary | ICD-10-CM | POA: Diagnosis not present

## 2014-09-21 DIAGNOSIS — L97821 Non-pressure chronic ulcer of other part of left lower leg limited to breakdown of skin: Secondary | ICD-10-CM | POA: Diagnosis not present

## 2014-09-21 DIAGNOSIS — E11621 Type 2 diabetes mellitus with foot ulcer: Secondary | ICD-10-CM | POA: Diagnosis not present

## 2014-09-21 DIAGNOSIS — E119 Type 2 diabetes mellitus without complications: Secondary | ICD-10-CM | POA: Diagnosis not present

## 2014-09-21 DIAGNOSIS — I87312 Chronic venous hypertension (idiopathic) with ulcer of left lower extremity: Secondary | ICD-10-CM | POA: Diagnosis not present

## 2014-09-27 DIAGNOSIS — E875 Hyperkalemia: Secondary | ICD-10-CM | POA: Diagnosis not present

## 2014-09-28 DIAGNOSIS — I10 Essential (primary) hypertension: Secondary | ICD-10-CM | POA: Diagnosis not present

## 2014-09-28 DIAGNOSIS — L97821 Non-pressure chronic ulcer of other part of left lower leg limited to breakdown of skin: Secondary | ICD-10-CM | POA: Diagnosis not present

## 2014-09-28 DIAGNOSIS — E11621 Type 2 diabetes mellitus with foot ulcer: Secondary | ICD-10-CM | POA: Diagnosis not present

## 2014-09-28 DIAGNOSIS — I87312 Chronic venous hypertension (idiopathic) with ulcer of left lower extremity: Secondary | ICD-10-CM | POA: Diagnosis not present

## 2014-09-28 DIAGNOSIS — E119 Type 2 diabetes mellitus without complications: Secondary | ICD-10-CM | POA: Diagnosis not present

## 2014-09-28 DIAGNOSIS — I872 Venous insufficiency (chronic) (peripheral): Secondary | ICD-10-CM | POA: Diagnosis not present

## 2014-10-01 DIAGNOSIS — E13331 Other specified diabetes mellitus with moderate nonproliferative diabetic retinopathy with macular edema: Secondary | ICD-10-CM | POA: Diagnosis not present

## 2014-10-01 DIAGNOSIS — N184 Chronic kidney disease, stage 4 (severe): Secondary | ICD-10-CM | POA: Diagnosis not present

## 2014-10-01 DIAGNOSIS — E1121 Type 2 diabetes mellitus with diabetic nephropathy: Secondary | ICD-10-CM | POA: Diagnosis not present

## 2014-10-01 DIAGNOSIS — I110 Hypertensive heart disease with heart failure: Secondary | ICD-10-CM | POA: Diagnosis not present

## 2014-10-04 DIAGNOSIS — E569 Vitamin deficiency, unspecified: Secondary | ICD-10-CM | POA: Diagnosis not present

## 2014-10-04 DIAGNOSIS — E119 Type 2 diabetes mellitus without complications: Secondary | ICD-10-CM | POA: Diagnosis not present

## 2014-10-04 DIAGNOSIS — E663 Overweight: Secondary | ICD-10-CM | POA: Diagnosis not present

## 2014-10-04 DIAGNOSIS — E780 Pure hypercholesterolemia: Secondary | ICD-10-CM | POA: Diagnosis not present

## 2014-10-05 DIAGNOSIS — L97821 Non-pressure chronic ulcer of other part of left lower leg limited to breakdown of skin: Secondary | ICD-10-CM | POA: Diagnosis not present

## 2014-10-05 DIAGNOSIS — E11621 Type 2 diabetes mellitus with foot ulcer: Secondary | ICD-10-CM | POA: Diagnosis not present

## 2014-10-05 DIAGNOSIS — I10 Essential (primary) hypertension: Secondary | ICD-10-CM | POA: Diagnosis not present

## 2014-10-05 DIAGNOSIS — I872 Venous insufficiency (chronic) (peripheral): Secondary | ICD-10-CM | POA: Diagnosis not present

## 2014-10-05 DIAGNOSIS — I87312 Chronic venous hypertension (idiopathic) with ulcer of left lower extremity: Secondary | ICD-10-CM | POA: Diagnosis not present

## 2014-10-05 DIAGNOSIS — H81399 Other peripheral vertigo, unspecified ear: Secondary | ICD-10-CM | POA: Diagnosis not present

## 2014-10-05 DIAGNOSIS — E119 Type 2 diabetes mellitus without complications: Secondary | ICD-10-CM | POA: Diagnosis not present

## 2014-10-08 DIAGNOSIS — H47011 Ischemic optic neuropathy, right eye: Secondary | ICD-10-CM | POA: Diagnosis not present

## 2014-10-12 DIAGNOSIS — L97821 Non-pressure chronic ulcer of other part of left lower leg limited to breakdown of skin: Secondary | ICD-10-CM | POA: Diagnosis not present

## 2014-10-12 DIAGNOSIS — E119 Type 2 diabetes mellitus without complications: Secondary | ICD-10-CM | POA: Diagnosis not present

## 2014-10-12 DIAGNOSIS — I872 Venous insufficiency (chronic) (peripheral): Secondary | ICD-10-CM | POA: Diagnosis not present

## 2014-10-12 DIAGNOSIS — I10 Essential (primary) hypertension: Secondary | ICD-10-CM | POA: Diagnosis not present

## 2014-10-15 DIAGNOSIS — I87312 Chronic venous hypertension (idiopathic) with ulcer of left lower extremity: Secondary | ICD-10-CM | POA: Diagnosis not present

## 2014-10-15 DIAGNOSIS — I872 Venous insufficiency (chronic) (peripheral): Secondary | ICD-10-CM | POA: Diagnosis not present

## 2014-10-15 DIAGNOSIS — I10 Essential (primary) hypertension: Secondary | ICD-10-CM | POA: Diagnosis not present

## 2014-10-15 DIAGNOSIS — E119 Type 2 diabetes mellitus without complications: Secondary | ICD-10-CM | POA: Diagnosis not present

## 2014-10-15 DIAGNOSIS — L97821 Non-pressure chronic ulcer of other part of left lower leg limited to breakdown of skin: Secondary | ICD-10-CM | POA: Diagnosis not present

## 2014-10-15 DIAGNOSIS — E11621 Type 2 diabetes mellitus with foot ulcer: Secondary | ICD-10-CM | POA: Diagnosis not present

## 2014-10-22 DIAGNOSIS — L97821 Non-pressure chronic ulcer of other part of left lower leg limited to breakdown of skin: Secondary | ICD-10-CM | POA: Diagnosis not present

## 2014-10-22 DIAGNOSIS — I872 Venous insufficiency (chronic) (peripheral): Secondary | ICD-10-CM | POA: Diagnosis not present

## 2014-10-22 DIAGNOSIS — L97229 Non-pressure chronic ulcer of left calf with unspecified severity: Secondary | ICD-10-CM | POA: Diagnosis not present

## 2014-10-22 DIAGNOSIS — E11621 Type 2 diabetes mellitus with foot ulcer: Secondary | ICD-10-CM | POA: Diagnosis not present

## 2014-10-22 DIAGNOSIS — L97829 Non-pressure chronic ulcer of other part of left lower leg with unspecified severity: Secondary | ICD-10-CM | POA: Diagnosis not present

## 2014-10-22 DIAGNOSIS — E11622 Type 2 diabetes mellitus with other skin ulcer: Secondary | ICD-10-CM | POA: Diagnosis not present

## 2014-10-29 DIAGNOSIS — I10 Essential (primary) hypertension: Secondary | ICD-10-CM | POA: Diagnosis not present

## 2014-10-29 DIAGNOSIS — E119 Type 2 diabetes mellitus without complications: Secondary | ICD-10-CM | POA: Diagnosis not present

## 2014-10-29 DIAGNOSIS — E11621 Type 2 diabetes mellitus with foot ulcer: Secondary | ICD-10-CM | POA: Diagnosis not present

## 2014-10-29 DIAGNOSIS — I872 Venous insufficiency (chronic) (peripheral): Secondary | ICD-10-CM | POA: Diagnosis not present

## 2014-10-29 DIAGNOSIS — L97821 Non-pressure chronic ulcer of other part of left lower leg limited to breakdown of skin: Secondary | ICD-10-CM | POA: Diagnosis not present

## 2014-10-29 DIAGNOSIS — I87312 Chronic venous hypertension (idiopathic) with ulcer of left lower extremity: Secondary | ICD-10-CM | POA: Diagnosis not present

## 2014-11-04 DIAGNOSIS — E663 Overweight: Secondary | ICD-10-CM | POA: Diagnosis not present

## 2014-11-04 DIAGNOSIS — E119 Type 2 diabetes mellitus without complications: Secondary | ICD-10-CM | POA: Diagnosis not present

## 2014-11-04 DIAGNOSIS — E569 Vitamin deficiency, unspecified: Secondary | ICD-10-CM | POA: Diagnosis not present

## 2014-11-04 DIAGNOSIS — E780 Pure hypercholesterolemia: Secondary | ICD-10-CM | POA: Diagnosis not present

## 2014-11-05 ENCOUNTER — Encounter: Payer: Self-pay | Admitting: *Deleted

## 2014-11-05 DIAGNOSIS — I10 Essential (primary) hypertension: Secondary | ICD-10-CM | POA: Diagnosis not present

## 2014-11-05 DIAGNOSIS — E11621 Type 2 diabetes mellitus with foot ulcer: Secondary | ICD-10-CM | POA: Diagnosis not present

## 2014-11-05 DIAGNOSIS — I872 Venous insufficiency (chronic) (peripheral): Secondary | ICD-10-CM | POA: Diagnosis not present

## 2014-11-05 DIAGNOSIS — L97821 Non-pressure chronic ulcer of other part of left lower leg limited to breakdown of skin: Secondary | ICD-10-CM | POA: Diagnosis not present

## 2014-11-06 ENCOUNTER — Encounter: Payer: Self-pay | Admitting: Critical Care Medicine

## 2014-11-06 ENCOUNTER — Ambulatory Visit (INDEPENDENT_AMBULATORY_CARE_PROVIDER_SITE_OTHER): Payer: Medicare Other | Admitting: Critical Care Medicine

## 2014-11-06 VITALS — BP 128/60 | HR 46 | Temp 96.3°F | Ht 61.0 in | Wt 194.4 lb

## 2014-11-06 DIAGNOSIS — G4730 Sleep apnea, unspecified: Secondary | ICD-10-CM | POA: Diagnosis not present

## 2014-11-06 DIAGNOSIS — R918 Other nonspecific abnormal finding of lung field: Secondary | ICD-10-CM | POA: Diagnosis not present

## 2014-11-06 DIAGNOSIS — G4733 Obstructive sleep apnea (adult) (pediatric): Secondary | ICD-10-CM | POA: Diagnosis not present

## 2014-11-06 DIAGNOSIS — Z9989 Dependence on other enabling machines and devices: Secondary | ICD-10-CM

## 2014-11-06 DIAGNOSIS — J418 Mixed simple and mucopurulent chronic bronchitis: Secondary | ICD-10-CM

## 2014-11-06 DIAGNOSIS — J449 Chronic obstructive pulmonary disease, unspecified: Secondary | ICD-10-CM

## 2014-11-06 NOTE — Patient Instructions (Addendum)
A CT Chest will be obtained Refill on albuterol sent We will call with results of CT Scan Return 6 months

## 2014-11-06 NOTE — Progress Notes (Signed)
Subjective:    Patient ID: Natalie Porter, female    DOB: 11-21-1940, 74 y.o.   MRN: [id number]  HPI Comments: Patient presents with: Pulmonary Consult: Ref. by Dr. Sheppard Plumber pulmonary nodules, sob-with exertion,cough-dry.Does not have Albuterol inhaler ran out.  Hx of dyspnea with exertion :  Body gives out first.  Notes a dry cough. Hx of nodules. On cpap, no oxygen Main issue was referral  Shortness of Breath This is a chronic problem. The problem occurs daily (DOE only, ok at rest: 132f. ). The problem has been unchanged. Associated symptoms include leg swelling. Pertinent negatives include no abdominal pain, chest pain, ear pain, fever, headaches, hemoptysis, leg pain, orthopnea, PND, rash, rhinorrhea, sore throat, sputum production, syncope, vomiting or wheezing. The symptoms are aggravated by any activity. Risk factors include smoking. She has tried beta agonist inhalers for the symptoms. Her past medical history is significant for COPD and DVT. There is no history of asthma, bronchiolitis, chronic lung disease, PE, pneumonia or a recent surgery.    Past Medical History  Diagnosis Date  . Diabetes mellitus without complication   . Hypertension   . Renal disorder   . Gallstone   . Arthritis   . DVT (deep venous thrombosis)     2011  . COPD (chronic obstructive pulmonary disease)   . Pneumonia 2014  . Neuropathy   . GERD (gastroesophageal reflux disease)   . Constipation   . Anemia   . Hypercholesteremia   . CHF (congestive heart failure)   . Iron deficiency anemia   . Vitamin B12 deficiency   . PVD (peripheral vascular disease)   . Osteoarthritis   . CAD (coronary artery disease)      Family History  Problem Relation Age of Onset  . Heart attack Mother   . Stroke Father   . Heart attack Father   . Cancer Sister     UTheadora Rama . Cancer Brother   . Cancer Sister     Lung     Social History   Social History  . Marital Status: Widowed    Spouse Name: N/A    . Number of Children: N/A  . Years of Education: N/A   Occupational History  . Not on file.   Social History Main Topics  . Smoking status: Former Smoker -- 1.00 packs/day for 34 years    Types: Cigarettes    Quit date: 03/28/1988  . Smokeless tobacco: Never Used  . Alcohol Use: No  . Drug Use: No  . Sexual Activity: Not on file     Comment: quit 1990   Other Topics Concern  . Not on file   Social History Narrative   Lives alone. Widowed.   Retired.     Allergies  Allergen Reactions  . Codeine Nausea Only  . Morphine And Related Nausea And Vomiting     Outpatient Prescriptions Prior to Visit  Medication Sig Dispense Refill  . acetaminophen (TYLENOL) 500 MG tablet Take 500 mg by mouth every 6 (six) hours as needed.    .Marland KitchenamLODipine (NORVASC) 10 MG tablet Take 10 mg by mouth daily.    .Marland Kitchenaspirin EC 81 MG tablet Take 81 mg by mouth daily.    . calcium-vitamin D (OSCAL) 250-125 MG-UNIT per tablet Take 1 tablet by mouth 2 (two) times daily.    . carvedilol (COREG) 25 MG tablet Take 25 mg by mouth 2 (two) times daily with a meal.    . chlorthalidone (HYGROTON) 50 MG  tablet Take 50 mg by mouth daily.    Marland Kitchen escitalopram (LEXAPRO) 20 MG tablet Take 20 mg by mouth daily.    . furosemide (LASIX) 20 MG tablet Take 1 tablet (20 mg total) by mouth daily. (Patient taking differently: Take 20 mg by mouth. Take 2 tablets every morning.) 30 tablet 1  . gabapentin (NEURONTIN) 100 MG capsule Take 100 mg by mouth. 200 mg in the morning and 100 mg at bedtime    . insulin glargine (LANTUS) 100 UNIT/ML injection Inject 20 Units into the skin at bedtime.     Marland Kitchen albuterol (PROVENTIL HFA;VENTOLIN HFA) 108 (90 BASE) MCG/ACT inhaler Inhale 2 puffs into the lungs every 6 (six) hours as needed for wheezing or shortness of breath.    . Aclidinium Bromide (TUDORZA PRESSAIR) 400 MCG/ACT AEPB Inhale 1 puff into the lungs every 12 (twelve) hours.    . ferrous sulfate 325 (65 FE) MG tablet Take 325 mg by mouth  daily with breakfast.    . glipiZIDE (GLUCOTROL XL) 5 MG 24 hr tablet Take 10 mg by mouth daily with breakfast.    . HYDROcodone-acetaminophen (NORCO) 10-325 MG per tablet Take 1 tablet by mouth every 6 (six) hours as needed. (Patient not taking: Reported on 11/06/2014) 30 tablet 0  . levofloxacin (LEVAQUIN) 500 MG tablet Take 1 tablet (500 mg total) by mouth daily. (Patient not taking: Reported on 11/06/2014) 14 tablet 1  . lisinopril (PRINIVIL,ZESTRIL) 5 MG tablet Take 5 mg by mouth daily.    . montelukast (SINGULAIR) 10 MG tablet Take 10 mg by mouth at bedtime.    . pioglitazone (ACTOS) 45 MG tablet Take 45 mg by mouth daily.    . rosuvastatin (CRESTOR) 40 MG tablet Take 40 mg by mouth daily.    . Vitamin D, Ergocalciferol, (DRISDOL) 50000 UNITS CAPS capsule Take 50,000 Units by mouth every 7 (seven) days.     No facility-administered medications prior to visit.      Review of Systems  Constitutional: Negative.  Negative for fever.  HENT: Negative.  Negative for ear pain, postnasal drip, rhinorrhea, sinus pressure, sore throat, trouble swallowing and voice change.   Eyes: Negative.   Respiratory: Positive for shortness of breath. Negative for apnea, cough, hemoptysis, sputum production, choking, chest tightness, wheezing and stridor.   Cardiovascular: Positive for leg swelling. Negative for chest pain, palpitations, orthopnea, syncope and PND.  Gastrointestinal: Negative.  Negative for nausea, vomiting, abdominal pain and abdominal distention.  Genitourinary: Negative.   Musculoskeletal: Negative.  Negative for myalgias and arthralgias.  Skin: Negative.  Negative for rash.  Allergic/Immunologic: Negative.  Negative for environmental allergies and food allergies.  Neurological: Negative.  Negative for dizziness, syncope, weakness and headaches.  Hematological: Negative.  Negative for adenopathy. Does not bruise/bleed easily.  Psychiatric/Behavioral: Negative.  Negative for sleep  disturbance and agitation. The patient is not nervous/anxious.        Objective:   Physical Exam Filed Vitals:   11/06/14 1031  BP: 128/60  Pulse: 46  Temp: 96.3 F (35.7 C)  TempSrc: Oral  Height: '5\' 1"'$  (1.549 m)  Weight: 194 lb 6.4 oz (88.179 kg)  SpO2: 96%    Gen: Pleasant, well-nourished, in no distress,  normal affect  ENT: No lesions,  mouth clear,  oropharynx clear, no postnasal drip  Neck: No JVD, no TMG, no carotid bruits  Lungs: No use of accessory muscles, no dullness to percussion, clear without rales or rhonchi  Cardiovascular: RRR, heart sounds normal, no murmur  or gallops, no peripheral edema  Abdomen: soft and NT, no HSM,  BS normal  Musculoskeletal: No deformities, no cyanosis or clubbing  Neuro: alert, non focal  Skin: Warm, no lesions or rashes  No results found.  Data from [facility] reviewed: PFTS 11/03/12 fev1 81% VC 88% ratio =73% FEF25-75 73% weak bronchodilator response lung volmes TLC109%, RV138% DLCO 57% hgb 10.2grams so her real DLCO is probably ~65%  interpretation: obstruction/restriction, + gas trapping and hyperinflation, mildly reduced diffusion  ABG not done  CT Chest 09/26/12 Small bilateral pleural effusions. Bilateral patchy interstitial and alveolar infiltrates suggesting edema or pneumonia. Multiple pulmonary nodules, largest on the right measuring 10 mm. 59-monthfollow-up recommended unless older comparison studies are available. Cholelithiasis. Fatty infiltration of the liver.  CT santara on disc sept 2013 one nodule RLL about one cm. There is a small cavity in L apex. Coronaries and aorta are heavily calcified. She looks like she has 4 chamber cardiac enlargement  CT chest jan 2015 in EPIC similar size nodule in RLL resolution of infiltrates Echo 09/2012 [place] NL LVEF mild AS and MR, mild LAE TRJ 3.1 IVC collapses normally suggesting NL CVP. RV is NL        Assessment & Plan:  I personally reviewed all images and lab [facility] as well as any outside material available during this office visit and agree with the  radiology impressions.   CAFL (chronic airflow limitation) Chronic obstructive airways disease stable on Spiriva Plan Maintain Spiriva  Lung nodule, multiple Multiple lung nodules here for evaluation of same Previous bronchoscopies 2013 for lung nodules showing no malignancy Last CT scan of chest July 2015 showing a new right lung nodule but resolving left lung nodules Plan Obtain repeat CT scan of chest   Shi was seen today for pulmonary consult.  Diagnoses and all orders for this visit:  Pulmonary nodules/lesions, multiple -     CT Chest Wo Contrast; Future -     CT Chest Wo Contrast  COPD with chronic bronchitis  OSA on CPAP  Mixed simple and mucopurulent chronic bronchitis  Lung nodule, multiple    I had an extended discussion with the patient and or family lasting 10 minutes of a 25 minute visit including:  Need for CT chest to f/u lung nodule

## 2014-11-07 NOTE — Assessment & Plan Note (Signed)
Chronic obstructive airways disease stable on Spiriva Plan Maintain Spiriva

## 2014-11-07 NOTE — Assessment & Plan Note (Signed)
Multiple lung nodules here for evaluation of same Previous bronchoscopies 2013 for lung nodules showing no malignancy Last CT scan of chest July 2015 showing a new right lung nodule but resolving left lung nodules Plan Obtain repeat CT scan of chest

## 2014-11-12 DIAGNOSIS — J984 Other disorders of lung: Secondary | ICD-10-CM | POA: Diagnosis not present

## 2014-11-12 DIAGNOSIS — R911 Solitary pulmonary nodule: Secondary | ICD-10-CM | POA: Diagnosis not present

## 2014-11-12 DIAGNOSIS — R918 Other nonspecific abnormal finding of lung field: Secondary | ICD-10-CM | POA: Diagnosis not present

## 2014-11-13 DIAGNOSIS — L97821 Non-pressure chronic ulcer of other part of left lower leg limited to breakdown of skin: Secondary | ICD-10-CM | POA: Diagnosis not present

## 2014-11-13 DIAGNOSIS — I10 Essential (primary) hypertension: Secondary | ICD-10-CM | POA: Diagnosis not present

## 2014-11-13 DIAGNOSIS — E119 Type 2 diabetes mellitus without complications: Secondary | ICD-10-CM | POA: Diagnosis not present

## 2014-11-13 DIAGNOSIS — I872 Venous insufficiency (chronic) (peripheral): Secondary | ICD-10-CM | POA: Diagnosis not present

## 2014-11-16 ENCOUNTER — Telehealth: Payer: Self-pay | Admitting: Critical Care Medicine

## 2014-11-16 NOTE — Telephone Encounter (Signed)
I gave pt ct chest results Needs ct to be a super D image Needs consult visit with dr byrum for poss ENB procedure asap

## 2014-11-19 NOTE — Telephone Encounter (Signed)
Spoke with Vickie with [facility].  She will make super d images from Ct Chest.  Given we will be in the [place] office tomorrow, they will send the disc through the courier service to our [place] office.

## 2014-11-19 NOTE — Telephone Encounter (Addendum)
Spoke with Tamika with [facility] as this is where CT Chest was completed.  Was advised would need to call [facility] for this at [phone] but was transferred to [place] at [place] with [facility] with [place] at [facility].  She will check into this and will call me back.  Will await call.    Will work with Dr. Lamonte Sakai and Ria Comment to schedule pt an asap appt with RB.

## 2014-11-19 NOTE — Telephone Encounter (Signed)
Per Dr. Joya Gaskins, appt with RB needs to be within 4 wks.    Spoke with pt.  Scheduled to see RB on Sept 20 at 3:15 pm at [facility] office . Pt ok with this appt date and confirmed appt time and location.  She verbalized understanding, is to call office back if needed prior to appt, and voiced no further questions or concerns at this time.  Will await on Super D images.

## 2014-11-19 NOTE — Telephone Encounter (Signed)
Return call [phone] said she could do it and will mail it out to you.Natalie Porter

## 2014-11-20 NOTE — Telephone Encounter (Signed)
Disc received from [facility] office today and given to Anton Ruiz.    Will sign off and route to [place] as fyi -- pt has a pending appt with RB on Sept 20.

## 2014-11-29 DIAGNOSIS — E11621 Type 2 diabetes mellitus with foot ulcer: Secondary | ICD-10-CM | POA: Diagnosis not present

## 2014-11-29 DIAGNOSIS — L97821 Non-pressure chronic ulcer of other part of left lower leg limited to breakdown of skin: Secondary | ICD-10-CM | POA: Diagnosis not present

## 2014-11-29 DIAGNOSIS — I10 Essential (primary) hypertension: Secondary | ICD-10-CM | POA: Diagnosis not present

## 2014-11-29 DIAGNOSIS — E119 Type 2 diabetes mellitus without complications: Secondary | ICD-10-CM | POA: Diagnosis not present

## 2014-11-29 DIAGNOSIS — I872 Venous insufficiency (chronic) (peripheral): Secondary | ICD-10-CM | POA: Diagnosis not present

## 2014-12-04 DIAGNOSIS — I10 Essential (primary) hypertension: Secondary | ICD-10-CM | POA: Diagnosis not present

## 2014-12-04 DIAGNOSIS — I872 Venous insufficiency (chronic) (peripheral): Secondary | ICD-10-CM | POA: Diagnosis not present

## 2014-12-04 DIAGNOSIS — Z872 Personal history of diseases of the skin and subcutaneous tissue: Secondary | ICD-10-CM | POA: Diagnosis not present

## 2014-12-04 DIAGNOSIS — E11621 Type 2 diabetes mellitus with foot ulcer: Secondary | ICD-10-CM | POA: Diagnosis not present

## 2014-12-04 DIAGNOSIS — E119 Type 2 diabetes mellitus without complications: Secondary | ICD-10-CM | POA: Diagnosis not present

## 2014-12-04 DIAGNOSIS — Z8631 Personal history of diabetic foot ulcer: Secondary | ICD-10-CM | POA: Diagnosis not present

## 2014-12-11 ENCOUNTER — Encounter: Payer: Self-pay | Admitting: Emergency Medicine

## 2014-12-11 ENCOUNTER — Ambulatory Visit (INDEPENDENT_AMBULATORY_CARE_PROVIDER_SITE_OTHER): Payer: Medicare Other | Admitting: Emergency Medicine

## 2014-12-11 VITALS — BP 130/69 | HR 47 | Wt 192.0 lb

## 2014-12-11 DIAGNOSIS — J441 Chronic obstructive pulmonary disease with (acute) exacerbation: Secondary | ICD-10-CM | POA: Insufficient documentation

## 2014-12-11 DIAGNOSIS — R918 Other nonspecific abnormal finding of lung field: Secondary | ICD-10-CM | POA: Diagnosis not present

## 2014-12-11 DIAGNOSIS — I6523 Occlusion and stenosis of bilateral carotid arteries: Secondary | ICD-10-CM

## 2014-12-11 DIAGNOSIS — R911 Solitary pulmonary nodule: Secondary | ICD-10-CM

## 2014-12-11 DIAGNOSIS — J449 Chronic obstructive pulmonary disease, unspecified: Secondary | ICD-10-CM | POA: Diagnosis not present

## 2014-12-11 NOTE — Patient Instructions (Signed)
Please continue to take your Spiriva every day We will check to see if you were CT scan from [facility] came be converted for navigation. If not we will repeat the CT scan with high-resolution cuts We will perform a PET scan After the scans are completed we will call you to make plans for the appropriate kind of biopsy.  Follow with Dr Lamonte Sakai in 1 month

## 2014-12-11 NOTE — Assessment & Plan Note (Signed)
She has been unreliable with her Spiriva. I encouraged her to use it every day on a schedule.

## 2014-12-11 NOTE — Progress Notes (Signed)
Subjective:    Patient ID: Natalie Porter, female    DOB: 1940/06/02, 74 y.o.   MRN: [id number]  HPI 74 year old woman with history of tobacco use (34 pack years), diabetes, hypertension,COPD. She has been seen by Dr. Joya Gaskins for her COPD in for an abnormal CT scan of the chest. A repeat CT scan was ordered on 11/12/14 and she is here to follow-up this study. I have personally reviewed the CT which shows further enlargement of the leg and right lower lobe nodule concerning for malignancy. There is also a left apical density that has enlarged and is now more solid in appearance.  [place] and she was not dx with a malignancy.  She is on Spiriva but doesn't take reliably.    Review of Systems As per HPI  Past Medical History  Diagnosis Date  . Diabetes mellitus without complication   . Hypertension   . Renal disorder   . Gallstone   . Arthritis   . DVT (deep venous thrombosis)     2011  . COPD (chronic obstructive pulmonary disease)   . Pneumonia 2014  . Neuropathy   . GERD (gastroesophageal reflux disease)   . Constipation   . Anemia   . Hypercholesteremia   . CHF (congestive heart failure)   . Iron deficiency anemia   . Vitamin B12 deficiency   . PVD (peripheral vascular disease)   . Osteoarthritis   . CAD (coronary artery disease)      Family History  Problem Relation Age of Onset  . Heart attack Mother   . Stroke Father   . Heart attack Father   . Cancer Sister     [name]  . Cancer Brother   . Cancer Sister     Lung     Social History   Social History  . Marital Status: Widowed    Spouse Name: N/A  . Number of Children: N/A  . Years of Education: N/A   Occupational History  . Not on file.   Social History Main Topics  . Smoking status: Former Smoker -- 1.00 packs/day for 34 years    Types: Cigarettes    Quit date: 03/28/1988  . Smokeless tobacco: Never Used  . Alcohol Use: No  . Drug Use: No  . Sexual Activity:  Not on file     Comment: quit 1990   Other Topics Concern  . Not on file   Social History Narrative   Lives alone. Widowed.   Retired.     Allergies  Allergen Reactions  . Codeine Nausea Only  . Morphine And Related Nausea And Vomiting     Outpatient Prescriptions Prior to Visit  Medication Sig Dispense Refill  . acetaminophen (TYLENOL) 500 MG tablet Take 500 mg by mouth every 6 (six) hours as needed.    Marland Kitchen albuterol (PROVENTIL HFA;VENTOLIN HFA) 108 (90 BASE) MCG/ACT inhaler Inhale 2 puffs into the lungs every 6 (six) hours as needed for wheezing or shortness of breath.    Marland Kitchen amLODipine (NORVASC) 10 MG tablet Take 10 mg by mouth daily.    Marland Kitchen aspirin EC 81 MG tablet Take 81 mg by mouth daily.    Marland Kitchen atorvastatin (LIPITOR) 80 MG tablet Take 80 mg by mouth daily.    . calcium-vitamin D (OSCAL) 250-125 MG-UNIT per tablet Take 1 tablet by mouth 2 (two) times daily.    . carvedilol (COREG) 25 MG tablet Take 25 mg by mouth 2 (two) times  daily with a meal.    . chlorthalidone (HYGROTON) 50 MG tablet Take 50 mg by mouth daily.    Marland Kitchen escitalopram (LEXAPRO) 20 MG tablet Take 20 mg by mouth daily.    . furosemide (LASIX) 20 MG tablet Take 1 tablet (20 mg total) by mouth daily. (Patient taking differently: Take 20 mg by mouth. Take 2 tablets every morning.) 30 tablet 1  . gabapentin (NEURONTIN) 100 MG capsule Take 100 mg by mouth. 200 mg in the morning and 100 mg at bedtime    . insulin glargine (LANTUS) 100 UNIT/ML injection Inject 20 Units into the skin at bedtime.     Marland Kitchen tiotropium (SPIRIVA) 18 MCG inhalation capsule Place 18 mcg into inhaler and inhale daily.    . valsartan (DIOVAN) 80 MG tablet Take 80 mg by mouth daily.    Marland Kitchen omeprazole (PRILOSEC) 20 MG capsule Take 20 mg by mouth daily.     No facility-administered medications prior to visit.         Objective:   Physical Exam Filed Vitals:   12/11/14 1525  BP: 130/69  Pulse: 47  Weight: 192 lb (87.091 kg)  SpO2: 97%   Gen:  Pleasant, well-nourished, in no distress,  normal affect  ENT: No lesions,  mouth clear,  oropharynx clear, no postnasal drip  Neck: No JVD, no TMG, no carotid bruits  Lungs: No use of accessory muscles, clear without rales or rhonchi  Cardiovascular: RRR, heart sounds normal, no murmur or gallops, no peripheral edema  Musculoskeletal: No deformities, no cyanosis or clubbing  Neuro: alert, non focal  Skin: Warm, no lesions or rashes       Assessment & Plan:  Lung nodule, multiple Bilateral lung nodules that have enlarged on most recent CT scan of the chest. She has had navigational bronchoscopy in 2013 that was nondiagnostic. I will perform a PET scan to assess potential risk for malignancy. I'll also repeat her CT scan of the chest so that we can repeat the navigational bronchoscopy if indicated. We will phone her with the results and discuss plans for appropriate biopsy if indicated.   COPD (chronic obstructive pulmonary disease) She has been unreliable with her Spiriva. I encouraged her to use it every day on a schedule.

## 2014-12-11 NOTE — Assessment & Plan Note (Signed)
Bilateral lung nodules that have enlarged on most recent CT scan of the chest. She has had navigational bronchoscopy in 2013 that was nondiagnostic. I will perform a PET scan to assess potential risk for malignancy. I'll also repeat her CT scan of the chest so that we can repeat the navigational bronchoscopy if indicated. We will phone her with the results and discuss plans for appropriate biopsy if indicated.

## 2015-01-01 DIAGNOSIS — E1165 Type 2 diabetes mellitus with hyperglycemia: Secondary | ICD-10-CM | POA: Diagnosis not present

## 2015-01-02 DIAGNOSIS — R918 Other nonspecific abnormal finding of lung field: Secondary | ICD-10-CM | POA: Diagnosis not present

## 2015-01-02 DIAGNOSIS — I2510 Atherosclerotic heart disease of native coronary artery without angina pectoris: Secondary | ICD-10-CM | POA: Diagnosis not present

## 2015-01-02 DIAGNOSIS — R911 Solitary pulmonary nodule: Secondary | ICD-10-CM | POA: Diagnosis not present

## 2015-01-08 DIAGNOSIS — E133319 Other specified diabetes mellitus with moderate nonproliferative diabetic retinopathy with macular edema, unspecified eye: Secondary | ICD-10-CM | POA: Diagnosis not present

## 2015-01-08 DIAGNOSIS — M1990 Unspecified osteoarthritis, unspecified site: Secondary | ICD-10-CM | POA: Diagnosis not present

## 2015-01-08 DIAGNOSIS — Z23 Encounter for immunization: Secondary | ICD-10-CM | POA: Diagnosis not present

## 2015-01-08 DIAGNOSIS — Z0000 Encounter for general adult medical examination without abnormal findings: Secondary | ICD-10-CM | POA: Diagnosis not present

## 2015-01-08 DIAGNOSIS — E1121 Type 2 diabetes mellitus with diabetic nephropathy: Secondary | ICD-10-CM | POA: Diagnosis not present

## 2015-01-08 DIAGNOSIS — I110 Hypertensive heart disease with heart failure: Secondary | ICD-10-CM | POA: Diagnosis not present

## 2015-01-15 ENCOUNTER — Encounter: Payer: Self-pay | Admitting: Family

## 2015-01-17 ENCOUNTER — Encounter: Payer: Self-pay | Admitting: Family

## 2015-01-17 ENCOUNTER — Ambulatory Visit (INDEPENDENT_AMBULATORY_CARE_PROVIDER_SITE_OTHER): Payer: Medicare Other | Admitting: Family

## 2015-01-17 ENCOUNTER — Ambulatory Visit (HOSPITAL_COMMUNITY)
Admission: RE | Admit: 2015-01-17 | Discharge: 2015-01-17 | Disposition: A | Discharge status: Home / Self Care | Payer: Medicare Other | Source: Ambulatory Visit | Attending: Family | Admitting: Family

## 2015-01-17 ENCOUNTER — Ambulatory Visit (INDEPENDENT_AMBULATORY_CARE_PROVIDER_SITE_OTHER)
Admission: RE | Admit: 2015-01-17 | Discharge: 2015-01-17 | Disposition: A | Discharge status: Home / Self Care | Payer: Medicare Other | Source: Ambulatory Visit | Attending: Family | Admitting: Family

## 2015-01-17 VITALS — BP 145/44 | HR 52 | Temp 97.4°F | Resp 14 | Ht 61.0 in | Wt 199.0 lb

## 2015-01-17 DIAGNOSIS — Z95828 Presence of other vascular implants and grafts: Secondary | ICD-10-CM

## 2015-01-17 DIAGNOSIS — Z87891 Personal history of nicotine dependence: Secondary | ICD-10-CM

## 2015-01-17 DIAGNOSIS — Z9889 Other specified postprocedural states: Secondary | ICD-10-CM | POA: Diagnosis not present

## 2015-01-17 DIAGNOSIS — I739 Peripheral vascular disease, unspecified: Secondary | ICD-10-CM | POA: Diagnosis not present

## 2015-01-17 DIAGNOSIS — I6523 Occlusion and stenosis of bilateral carotid arteries: Secondary | ICD-10-CM

## 2015-01-17 DIAGNOSIS — Z48812 Encounter for surgical aftercare following surgery on the circulatory system: Secondary | ICD-10-CM | POA: Diagnosis not present

## 2015-01-17 NOTE — Progress Notes (Signed)
[facility] [place] HISTORY AND PHYSICAL -PAD  History of Present Illness Natalie Porter is a 74 y.o. female patient of Dr. Oneida Alar who is s/p right to left Fem-fem by pass graft, 08/07/2013. She also had a right CIA stent placed 08/09/13. She had a right CEA in [place], in [place].  She returns today for follow up. She is walking without claudication symptoms, but she does not walk much, states her dyspnea limits her walking. She does occasionally get some swelling of her legs when she is on her feet all day. She is not smoking. She reports tingling neuropathy in her feet.   Pt describes transient amaurosis fugax of right eye prior to right CEA, denies any history of hemiparesis or speech difficulties.  On 11/23/13 Graft duplex scan performed showed a patent femoral-femoral bypass with possibly some increased velocities at the proximal anastomosis between 388 and 400 toe pressure right-side 69 left side 54. ABIs artificially inflated secondary to calcification.  The patient denies New Medical or Surgical History. She uses CPAP for her OSA. She sees her pulmonologist, Dr. Lamonte Sakai, tomorrow to discuss results of her lung scan, surveillance of lung nodules per daughter.  Pt Diabetic: Yes, states in good control, states last A1C was 6.8 Pt smoker: former smoker, quit in the remote past  Pt meds include: Statin :Yes Betablocker: Yes ASA: Yes Other anticoagulants/antiplatelets: no    Past Medical History  Diagnosis Date  . Diabetes mellitus without complication ([place])   . Hypertension   . Renal disorder   . Gallstone   . Arthritis   . DVT (deep venous thrombosis) ([place])     2011  . COPD (chronic obstructive pulmonary disease) ([place])   . Pneumonia 2014  . Neuropathy ([place])   . GERD (gastroesophageal reflux disease)   . Constipation   . Anemia   . Hypercholesteremia   . CHF (congestive heart failure) ([place])   . Iron deficiency anemia   . Vitamin B12 deficiency    . PVD (peripheral vascular disease) ([place])   . Osteoarthritis   . CAD (coronary artery disease)     Social History Social History  Substance Use Topics  . Smoking status: Former Smoker -- 1.00 packs/day for 34 years    Types: Cigarettes    Quit date: 03/28/1988  . Smokeless tobacco: Never Used  . Alcohol Use: No    Family History Family History  Problem Relation Age of Onset  . Heart attack Mother   . Stroke Father   . Heart attack Father   . Cancer Sister     Natalie Porter  . Cancer Brother   . Cancer Sister     Lung    Past Surgical History  Procedure Laterality Date  . Tubal ligation    . Carpal tunnel release Bilateral   . Eye surgery Bilateral     cataract  . Foot surgery Bilateral     bone spurs, and repair  . Carotid endarterectomy Right 1990  . Breast biopsy Left   . Lung biopsy      non cancerous  . Colonoscopy w/ polypectomy    . Femoral-femoral bypass graft  08/07/2013    Procedure: BYPASS GRAFT FEMORAL-FEMORAL ARTERY USING 8MM X 30CM HEMASHIELD GRAFT; LEFT ILIAC ANGIOGRAM; ATTEMPTED LEFT ILIAC ARTERY STENT GRAFT;  Surgeon: Elam Dutch, MD;  Location: [facility];  Service: Vascular;;  . Application of wound vac Bilateral 08/07/2013    Procedure: APPLICATION OF INCISIONAL WOUND [place];  Surgeon: Jessy Oto  Fields, MD;  Location: [facility];  Service: Vascular;  Laterality: Bilateral;  . Endarterectomy femoral Left 08/07/2013    Procedure: ENDARTERECTOMY FEMORAL;  Surgeon: Elam Dutch, MD;  Location: [facility];  Service: Vascular;  Laterality: Left;  . Abdominal aortagram N/A 07/25/2013    Procedure: ABDOMINAL Maxcine Ham;  Surgeon: Serafina Mitchell, MD;  Location: [facility] CATH LAB;  Service: Cardiovascular;  Laterality: N/A;  . Left heart catheterization with coronary angiogram N/A 07/27/2013    Procedure: LEFT HEART CATHETERIZATION WITH CORONARY ANGIOGRAM;  Surgeon: Minus Breeding, MD;  [facility] C[facility] LAB;  Service: Cardiovascular;  Laterality: N/A;    Allergies  Allergen  Reactions  . Codeine Nausea Only  . Morphine And Related Nausea And Vomiting    Current Outpatient Prescriptions  Medication Sig Dispense Refill  . acetaminophen-codeine (TYLENOL #3) 300-30 MG tablet Take 1 tablet by mouth 2 (two) times daily. PRN  0  . amLODipine (NORVASC) 10 MG tablet Take 10 mg by mouth daily.    Mar[place] Kitchen aspirin EC 81 MG tablet Take 81 mg by mouth daily.    Mar[place] Kitchen atorvastatin (LIPITOR) 80 MG tablet Take 80 mg by mouth daily.    . calcium carbonate (TUMS - DOSED IN MG ELEMENTAL CALCIUM) 500 MG chewable tablet Chew 1 tablet by mouth daily.    . carvedilol (COREG) 25 MG tablet Take 25 mg by mouth 2 (two) times daily with a meal.    . chlorthalidone (HYGROTON) 50 MG tablet Take 50 mg by mouth daily.    Marland Kitchen escitalopram (LEXAPRO) 20 MG tablet Take 20 mg by mouth daily.    . furosemide (LASIX) 20 MG tablet Take 1 tablet (20 mg total) by mouth daily. (Patient taking differently: Take 20 mg by mouth. Take 2 tablets every morning.) 30 tablet 1  . gabapentin (NEURONTIN) 100 MG capsule Take 100 mg by mouth. 200 mg in the morning and 100 mg at bedtime    . insulin glargine (LANTUS) 100 UNIT/ML injection Inject 20 Units into the skin at bedtime.     Marland Kitchen omeprazole (PRILOSEC) 20 MG capsule Take 20 mg by mouth daily.    Marland Kitchen tiotropium (SPIRIVA) 18 MCG inhalation capsule Place 18 mcg into inhaler and inhale daily.    . valsartan (DIOVAN) 80 MG tablet Take 80 mg by mouth daily.    Marland Kitchen acetaminophen (TYLENOL) 500 MG tablet Take 500 mg by mouth every 6 (six) hours as needed.    Marland Kitchen albuterol (PROVENTIL HFA;VENTOLIN HFA) 108 (90 BASE) MCG/ACT inhaler Inhale 2 puffs into the lungs every 6 (six) hours as needed for wheezing or shortness of breath.    . calcium-vitamin D (OSCAL) 250-125 MG-UNIT per tablet Take 1 tablet by mouth 2 (two) times daily.     No current facility-administered medications for this visit.    ROS: See HPI for pertinent positives and negatives.   Physical Examination  Filed Vitals:    01/17/15 1129  BP: 145/44  Pulse: 52  Temp: 97.4 F (36.3 C)  TempSrc: Oral  Resp: 14  Height: '5\' 1"'$  (1.549 m)  Weight: 199 lb (90.266 kg)  SpO2: 97%   Body mass index is 37.62 kg/(m^2).  General: A&O x 3, WDWN, obese female. Gait: normal Eyes: PERRLA. Pulmonary: CTAB, without wheezes , rales or rhonchi. Cardiac: regular rhythm, no detected murmur.     Carotid Bruits Right Left   Positive Positive  Aorta is not palpable. Radial pulses: 2+ palpable and =   VASCULAR EXAM: Extremities without ischemic changes  without Gangrene; without open wounds.     LE Pulses Right Left   FEMORAL not palpable (obese) not palpable (obese)    POPLITEAL not palpable  not palpable   POSTERIOR TIBIAL not palpable   palpable    DORSALIS PEDIS  ANTERIOR TIBIAL faintly palpable  not palpable    Abdomen: soft, NT, no palpable masses. Skin: no rashes, no ulcers. Musculoskeletal: no muscle wasting or atrophy. Neurologic: A&O X 3; Appropriate Affect ; SENSATION: normal; MOTOR FUNCTION: moving all extremities equally, motor strength 5/5 throughout. Speech is fluent/normal. CN 2-12 intact except for significant hearing loss.               Non-Invasive Vascular Imaging: DATE: 01/17/2015 AORTO-ILIAC DUPLEX LIMITED EVALUATION  FEMORAL-FEMORAL BYPASS GRAFT    INDICATION: Follow-up femorofemoral bypass graft     PREVIOUS INTERVENTION(S): Right common iliac artery stent and left common femoral artery endarterectomy 07/2013 with placement of right to left femorofemoral bypass graft.    DUPLEX EXAM:     LOCATION Velocities                (cm/s) Diameter AP                          (cm) Diameter Transverse (cm)  Inflow Artery     Right CIA  (Stented)   495    Anastomosis       Right CFA        435    Graft         85    Anastomosis       Left CFA    108    Outflow Artery   Left SFA    135       Right  Left  Today's ABI/TBI [place]/0.58 [place]/0.48  Previous ABI/TBI   07/05/2014   1.18 [place]    If Ankle Brachial Index (ABI) or Toe Brachial Index (TBI) performed, please see complete report   ADDITIONAL FINDINGS:     IMPRESSION: 1. Significantly increased velocities suggestive of >75% stenosis throughout the right common iliac artery stent and native iliac inflow with post stenotic turbulence observed in the distal waveforms. 2. Right to left femorofemoral bypass graft appears widely patent without evidence of restenosis or hyperplasia.     Compared to the previous exam:  Inflow velocities have increased; however, the common iliac artery stent was not previously evaluated.     ASSESSMENT: Natalie Porter is a 74 y.o. female who is s/p right to left Fem-fem by pass graft 08/07/2013. She also had a right CIA stent placed 08/09/13. She had a right CEA in [place], in [place]. She has a history of transient amaurosis fugax of right eye prior to the right CEA in 1990, no stroke or TIA since then. She is walking without claudication symptoms, no tissue loss in LE's. However, she probably does not walk enough to elicit claudication symptoms as her dyspnea limits her walking. Her daughter states that she could walk more but is not motivated.   Today's aortoiliac, femoral to femoral bypass graft duplex demonstrates significantly increased velocities suggestive of >75% stenosis throughout the right common iliac artery stent and native iliac inflow with post stenotic turbulence observed in the distal waveforms. Right to left femorofemoral bypass graft appears widely patent without evidence of restenosis or hyperplasia. Inflow velocities have increased; however, the common iliac artery stent was not previously evaluated.   Discussed with Dr.  Oneida Alar that pt  just had chest CT, but duplex velocites have increased to almost 500 to the inflow of right iliac stent. Will obtain CTA abdomen/pelvis. Pt's records from [facility] that her daughter brought include stage 3 CKD in her list of diagnoses. Dr. Oneida Alar indicated if pt's creatinine is less than 1.5, can perform CTA with bilateral run off.   PLAN:  Based on the patient's vascular studies and examination, pt will return to clinic in 2-4 weeks with CTA abdomen/pelvis to evaluate aortoiliac stenoses, follow up with Dr. Oneida Alar. Follow up in April 2017 for carotid duplex.  I discussed in depth with the patient the nature of atherosclerosis, and emphasized the importance of maximal medical management including strict control of blood pressure, blood glucose, and lipid levels, obtaining regular exercise, and continued cessation of smoking.  The patient is aware that without maximal medical management the underlying atherosclerotic disease process will progress, limiting the benefit of any interventions.  The patient was given information about PAD including signs, symptoms, treatment, what symptoms should prompt the patient to seek immediate medical care, and risk reduction measures to take.  Clemon Chambers, RN, MSN, FNP-C [facility] Phone: [phone]  Clinic MD: [facility]  01/17/2015 11:37 AM

## 2015-01-17 NOTE — Patient Instructions (Signed)
Peripheral Vascular Disease Peripheral vascular disease (PVD) is a disease of the blood vessels that are not part of your heart and brain. A simple term for PVD is poor circulation. In most cases, PVD narrows the blood vessels that carry blood from your heart to the rest of your body. This can result in a decreased supply of blood to your arms, legs, and internal organs, like your stomach or kidneys. However, it most often affects a person's lower legs and feet. There are two types of PVD.  Organic PVD. This is the more common type. It is caused by damage to the structure of blood vessels.  Functional PVD. This is caused by conditions that make blood vessels contract and tighten (spasm). Without treatment, PVD tends to get worse over time. PVD can also lead to acute ischemic limb. This is when an arm or limb suddenly has trouble getting enough blood. This is a medical emergency. CAUSES Each type of PVD has many different causes. The most common cause of PVD is buildup of a fatty material (plaque) inside of your arteries (atherosclerosis). Small amounts of plaque can break off from the walls of the blood vessels and become lodged in a smaller artery. This blocks blood flow and can cause acute ischemic limb. Other common causes of PVD include:  Blood clots that form inside of blood vessels.  Injuries to blood vessels.  Diseases that cause inflammation of blood vessels or cause blood vessel spasms.  Health behaviors and health history that increase your risk of developing PVD. RISK FACTORS  You may have a greater risk of PVD if you:  Have a family history of PVD.  Have certain medical conditions, including:  High cholesterol.  Diabetes.  High blood pressure (hypertension).  Coronary heart disease.  Past problems with blood clots.  Past injury, such as burns or a broken bone. These may have damaged blood vessels in your limbs.  Buerger disease. This is caused by inflamed blood  vessels in your hands and feet.  Some forms of arthritis.  Rare birth defects that affect the arteries in your legs.  Use tobacco.  Do not get enough exercise.  Are obese.  Are age 50 or older. SIGNS AND SYMPTOMS  PVD may cause many different symptoms. Your symptoms depend on what part of your body is not getting enough blood. Some common signs and symptoms include:  Cramps in your lower legs. This may be a symptom of poor leg circulation (claudication).  Pain and weakness in your legs while you are physically active that goes away when you rest (intermittent claudication).  Leg pain when at rest.  Leg numbness, tingling, or weakness.  Coldness in a leg or foot, especially when compared with the other leg.  Skin or hair changes. These can include:  Hair loss.  Shiny skin.  Pale or bluish skin.  Thick toenails.  Inability to get or maintain an erection (erectile dysfunction). People with PVD are more prone to developing ulcers and sores on their toes, feet, or legs. These may take longer than normal to heal. DIAGNOSIS Your health care provider may diagnose PVD from your signs and symptoms. The health care provider will also do a physical exam. You may have tests to find out what is causing your PVD and determine its severity. Tests may include:  Blood pressure recordings from your arms and legs and measurements of the strength of your pulses (pulse volume recordings).  Imaging studies using sound waves to take pictures of   the blood flow through your blood vessels (Doppler ultrasound).  Injecting a dye into your blood vessels before having imaging studies using:  X-rays (angiogram or arteriogram).  Computer-generated X-rays (CT angiogram).  A powerful electromagnetic field and a computer (magnetic resonance angiogram or MRA). TREATMENT Treatment for PVD depends on the cause of your condition and the severity of your symptoms. It also depends on your age. Underlying  causes need to be treated and controlled. These include long-lasting (chronic) conditions, such as diabetes, high cholesterol, and high blood pressure. You may need to first try making lifestyle changes and taking medicines. Surgery may be needed if these do not work. Lifestyle changes may include:  Quitting smoking.  Exercising regularly.  Following a low-fat, low-cholesterol diet. Medicines may include:  Blood thinners to prevent blood clots.  Medicines to improve blood flow.  Medicines to improve your blood cholesterol levels. Surgical procedures may include:  A procedure that uses an inflated balloon to open a blocked artery and improve blood flow (angioplasty).  A procedure to put in a tube (stent) to keep a blocked artery open (stent implant).  Surgery to reroute blood flow around a blocked artery (peripheral bypass surgery).  Surgery to remove dead tissue from an infected wound on the affected limb.  Amputation. This is surgical removal of the affected limb. This may be necessary in cases of acute ischemic limb that are not improved through medical or surgical treatments. HOME CARE INSTRUCTIONS  Take medicines only as directed by your health care provider.  Do not use any tobacco products, including cigarettes, chewing tobacco, or electronic cigarettes. If you need help quitting, ask your health care provider.  Lose weight if you are overweight, and maintain a healthy weight as directed by your health care provider.  Eat a diet that is low in fat and cholesterol. If you need help, ask your health care provider.  Exercise regularly. Ask your health care provider to suggest some good activities for you.  Use compression stockings or other mechanical devices as directed by your health care provider.  Take good care of your feet.  Wear comfortable shoes that fit well.  Check your feet often for any cuts or sores. SEEK MEDICAL CARE IF:  You have cramps in your legs  while walking.  You have leg pain when you are at rest.  You have coldness in a leg or foot.  Your skin changes.  You have erectile dysfunction.  You have cuts or sores on your feet that are not healing. SEEK IMMEDIATE MEDICAL CARE IF:  Your arm or leg turns cold and blue.  Your arms or legs become red, warm, swollen, painful, or numb.  You have chest pain or trouble breathing.  You suddenly have weakness in your face, arm, or leg.  You become very confused or lose the ability to speak.  You suddenly have a very bad headache or lose your vision.   This information is not intended to replace advice given to you by your health care provider. Make sure you discuss any questions you have with your health care provider.   Document Released: 04/16/2004 Document Revised: 03/30/2014 Document Reviewed: 08/17/2013 Elsevier Interactive Patient Education 2016 [employer]  

## 2015-01-18 ENCOUNTER — Encounter: Payer: Self-pay | Admitting: Emergency Medicine

## 2015-01-18 ENCOUNTER — Ambulatory Visit (INDEPENDENT_AMBULATORY_CARE_PROVIDER_SITE_OTHER): Payer: Medicare Other | Admitting: Emergency Medicine

## 2015-01-18 VITALS — BP 134/58 | HR 54 | Wt 200.0 lb

## 2015-01-18 DIAGNOSIS — R918 Other nonspecific abnormal finding of lung field: Secondary | ICD-10-CM

## 2015-01-18 DIAGNOSIS — I6523 Occlusion and stenosis of bilateral carotid arteries: Secondary | ICD-10-CM

## 2015-01-18 NOTE — Assessment & Plan Note (Signed)
Bilateral peripheral upper lobe nodules that are slowly growing and are hypermetabolic on PET scan. 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 We discussed the pros and cons of navigational bronchoscopy versus percutaneous needle biopsy. I believe that if we can perform serial IR biopsies then we will avoid general anesthesia. I explained to her that she will likely need to get these on different days. We will work on scheduling

## 2015-01-18 NOTE — Progress Notes (Signed)
Subjective:    Patient ID: Natalie Porter, female    DOB: 02/05/1941, 74 y.o.   MRN: [id number]  HPI 74 year old woman with history of tobacco use (34 pack years), diabetes, hypertension,COPD. She has been seen by Dr. Joya Gaskins for her COPD in for an abnormal CT scan of the chest. A repeat CT scan was ordered on 11/12/14 and she is here to follow-up this study. I have personally reviewed the CT which shows further enlargement of the leg and right lower lobe nodule concerning for malignancy. There is also a left apical density that has enlarged and is now more solid in appearance.  She has undergone FOB before in [place] and she was not dx with a malignancy.  She is on Spiriva but doesn't take reliably.   ROV 74/28/16 -- follow-up visit for history of COPD and abnormal CT scan of the chest with left and right lower lobe nodules and a left apical density. We performed a CT scan of the chest and a PET scan on 01/02/15 that showed that there is hypermetabolism in the 2.8 x 2.2 cm left upper lobe nodule, hypermetabolism in the 1.5 x 1.2 cm peripheral right lower lobe pulmonary nodule. I personally reviewed this scan and her CT scan of the chest today.  There are no hypermetabolic nodes or any evidence of distant disease. She tells me that she is using her Spiriva reliably   Review of Systems As per HPI  Past Medical History  Diagnosis Date  . Diabetes mellitus without complication ([place])   . Hypertension   . Renal disorder   . Gallstone   . Arthritis   . DVT (deep venous thrombosis) ([place])     2011  . COPD (chronic obstructive pulmonary disease) ([place])   . Pneumonia 2014  . Neuropathy ([place])   . GERD (gastroesophageal reflux disease)   . Constipation   . Anemia   . Hypercholesteremia   . CHF (congestive heart failure) ([place])   . Iron deficiency anemia   . Vitamin B12 deficiency   . PVD (peripheral vascular disease) ([place])   . Osteoarthritis   . CAD (coronary artery disease)       Family History  Problem Relation Age of Onset  . Heart attack Mother   . Stroke Father   . Heart attack Father   . Cancer Sister     [name]  . Cancer Brother   . Cancer Sister     Lung     Social History   Social History  . Marital Status: Widowed    Spouse Name: N/A  . Number of Children: N/A  . Years of Education: N/A   Occupational History  . Not on file.   Social History Main Topics  . Smoking status: Former Smoker -- 1.00 packs/day for 34 years    Types: Cigarettes    Quit date: 03/28/1988  . Smokeless tobacco: Never Used  . Alcohol Use: No  . Drug Use: No  . Sexual Activity: Not on file     Comment: quit 1990   Other Topics Concern  . Not on file   Social History Narrative   Lives alone. Widowed.   Retired.     Allergies  Allergen Reactions  . Codeine Nausea Only  . Morphine And Related Nausea And Vomiting     Outpatient Prescriptions Prior to Visit  Medication Sig Dispense Refill  . acetaminophen-codeine (TYLENOL #3) 300-30 MG tablet Take 1 tablet by mouth 2 (two) times daily. PRN  0  . amLODipine (NORVASC) 10 MG tablet Take 10 mg by mouth daily.    Marland Kitchen aspirin EC 81 MG tablet Take 81 mg by mouth daily.    Marland Kitchen atorvastatin (LIPITOR) 80 MG tablet Take 80 mg by mouth daily.    . calcium carbonate (TUMS - DOSED IN MG ELEMENTAL CALCIUM) 500 MG chewable tablet Chew 1 tablet by mouth daily.    . carvedilol (COREG) 25 MG tablet Take 25 mg by mouth 2 (two) times daily with a meal.    . chlorthalidone (HYGROTON) 50 MG tablet Take 50 mg by mouth daily.    Marland Kitchen escitalopram (LEXAPRO) 20 MG tablet Take 20 mg by mouth daily.    . furosemide (LASIX) 20 MG tablet Take 1 tablet (20 mg total) by mouth daily. (Patient taking differently: Take 20 mg by mouth. Take 2 tablets every morning.) 30 tablet 1  . gabapentin (NEURONTIN) 100 MG capsule Take 100 mg by mouth. 200 mg in the morning and 100 mg at bedtime    . insulin glargine (LANTUS) 100 UNIT/ML injection Inject 20  Units into the skin at bedtime.     Marland Kitchen omeprazole (PRILOSEC) 20 MG capsule Take 20 mg by mouth daily.    . pioglitazone (ACTOS) 15 MG tablet Take 15 mg by mouth daily.    Marland Kitchen tiotropium (SPIRIVA) 18 MCG inhalation capsule Place 18 mcg into inhaler and inhale daily.    . valsartan (DIOVAN) 80 MG tablet Take 80 mg by mouth daily.    Marland Kitchen acetaminophen (TYLENOL) 500 MG tablet Take 500 mg by mouth every 6 (six) hours as needed.    Marland Kitchen albuterol (PROVENTIL HFA;VENTOLIN HFA) 108 (90 BASE) MCG/ACT inhaler Inhale 2 puffs into the lungs every 6 (six) hours as needed for wheezing or shortness of breath.    . calcium-vitamin D (OSCAL) 250-125 MG-UNIT per tablet Take 1 tablet by mouth 2 (two) times daily.     No facility-administered medications prior to visit.         Objective:   Physical Exam Filed Vitals:   01/18/15 1005  BP: 134/58  Pulse: 54  Weight: 200 lb (90.719 kg)  SpO2: 94%   Gen: Pleasant, well-nourished, in no distress,  normal affect  ENT: No lesions,  mouth clear,  oropharynx clear, no postnasal drip  Neck: No JVD, no TMG, no carotid bruits  Lungs: No use of accessory muscles, clear without rales or rhonchi  Cardiovascular: RRR, heart sounds normal, no murmur or gallops, no peripheral edema  Musculoskeletal: No deformities, no cyanosis or clubbing  Neuro: alert, non focal  Skin: Warm, no lesions or rashes       Assessment & Plan:  COPD (chronic obstructive pulmonary disease) Continue Spiriva as ordered  Lung nodule, multiple Bilateral peripheral upper lobe nodules that are slowly growing and are hypermetabolic on PET scan. 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 We discussed the pros and cons of navigational bronchoscopy versus percutaneous needle biopsy. I believe that if we can perform serial IR biopsies then we will avoid general anesthesia. I explained to her that she will likely need to get these on different days. We will work on scheduling

## 2015-01-18 NOTE — Addendum Note (Signed)
Addended by: Maurice March on: 01/18/2015 10:55 AM   Modules accepted: Orders

## 2015-01-18 NOTE — Assessment & Plan Note (Signed)
Continue Spiriva as ordered

## 2015-01-18 NOTE — Patient Instructions (Signed)
Continue Spiriva once a day We will arrange for percutaneous needle biopsy in interventional radiology of your left and right upper lobe nodules. These will likely need to be done on different days - we will confirm with the radiologist.  Follow with Dr Lamonte Sakai in 1 month

## 2015-01-22 ENCOUNTER — Telehealth: Payer: Self-pay | Admitting: Emergency Medicine

## 2015-01-22 DIAGNOSIS — R918 Other nonspecific abnormal finding of lung field: Secondary | ICD-10-CM

## 2015-01-22 NOTE — Telephone Encounter (Signed)
lmtcb for [employer].

## 2015-01-22 NOTE — Telephone Encounter (Signed)
Called and spoke to [place], with scheduling. Pt is needing another CT biopsy order because the procedure may take 2 days, per RB and the radiologist. Another CT biopsy order has been placed. Natalie Porter verbalized understanding and denied any further questions or concerns at this time.

## 2015-01-22 NOTE — Telephone Encounter (Signed)
Natalie Porter, [phone]

## 2015-01-30 ENCOUNTER — Other Ambulatory Visit: Payer: Self-pay | Admitting: Radiology

## 2015-01-31 ENCOUNTER — Ambulatory Visit (HOSPITAL_COMMUNITY)
Admission: RE | Admit: 2015-01-31 | Discharge: 2015-01-31 | Disposition: A | Discharge status: Home / Self Care | Payer: Medicare Other | Source: Ambulatory Visit | Attending: Emergency Medicine | Admitting: Emergency Medicine

## 2015-01-31 ENCOUNTER — Ambulatory Visit (HOSPITAL_COMMUNITY)
Admission: RE | Admit: 2015-01-31 | Discharge: 2015-01-31 | Disposition: A | Discharge status: Home / Self Care | Payer: Medicare Other | Source: Ambulatory Visit | Attending: Interventional Radiology | Admitting: Interventional Radiology

## 2015-01-31 ENCOUNTER — Other Ambulatory Visit: Payer: Self-pay | Admitting: Radiology

## 2015-01-31 DIAGNOSIS — E7800 Pure hypercholesterolemia, unspecified: Secondary | ICD-10-CM | POA: Insufficient documentation

## 2015-01-31 DIAGNOSIS — J189 Pneumonia, unspecified organism: Secondary | ICD-10-CM | POA: Diagnosis not present

## 2015-01-31 DIAGNOSIS — I2510 Atherosclerotic heart disease of native coronary artery without angina pectoris: Secondary | ICD-10-CM | POA: Insufficient documentation

## 2015-01-31 DIAGNOSIS — Z794 Long term (current) use of insulin: Secondary | ICD-10-CM | POA: Diagnosis not present

## 2015-01-31 DIAGNOSIS — R918 Other nonspecific abnormal finding of lung field: Secondary | ICD-10-CM | POA: Diagnosis not present

## 2015-01-31 DIAGNOSIS — K219 Gastro-esophageal reflux disease without esophagitis: Secondary | ICD-10-CM | POA: Insufficient documentation

## 2015-01-31 DIAGNOSIS — I739 Peripheral vascular disease, unspecified: Secondary | ICD-10-CM | POA: Diagnosis not present

## 2015-01-31 DIAGNOSIS — E1140 Type 2 diabetes mellitus with diabetic neuropathy, unspecified: Secondary | ICD-10-CM | POA: Insufficient documentation

## 2015-01-31 DIAGNOSIS — J449 Chronic obstructive pulmonary disease, unspecified: Secondary | ICD-10-CM | POA: Diagnosis not present

## 2015-01-31 DIAGNOSIS — Z87891 Personal history of nicotine dependence: Secondary | ICD-10-CM | POA: Insufficient documentation

## 2015-01-31 DIAGNOSIS — Z86718 Personal history of other venous thrombosis and embolism: Secondary | ICD-10-CM | POA: Insufficient documentation

## 2015-01-31 DIAGNOSIS — J9811 Atelectasis: Secondary | ICD-10-CM | POA: Diagnosis not present

## 2015-01-31 DIAGNOSIS — Z9889 Other specified postprocedural states: Secondary | ICD-10-CM

## 2015-01-31 DIAGNOSIS — Z95828 Presence of other vascular implants and grafts: Secondary | ICD-10-CM | POA: Diagnosis not present

## 2015-01-31 DIAGNOSIS — I110 Hypertensive heart disease with heart failure: Secondary | ICD-10-CM | POA: Diagnosis not present

## 2015-01-31 DIAGNOSIS — G4733 Obstructive sleep apnea (adult) (pediatric): Secondary | ICD-10-CM | POA: Insufficient documentation

## 2015-01-31 DIAGNOSIS — Z79899 Other long term (current) drug therapy: Secondary | ICD-10-CM | POA: Diagnosis not present

## 2015-01-31 DIAGNOSIS — Z885 Allergy status to narcotic agent status: Secondary | ICD-10-CM | POA: Diagnosis not present

## 2015-01-31 DIAGNOSIS — R911 Solitary pulmonary nodule: Secondary | ICD-10-CM | POA: Diagnosis not present

## 2015-01-31 DIAGNOSIS — I509 Heart failure, unspecified: Secondary | ICD-10-CM | POA: Insufficient documentation

## 2015-01-31 DIAGNOSIS — Z7982 Long term (current) use of aspirin: Secondary | ICD-10-CM | POA: Insufficient documentation

## 2015-01-31 LAB — CBC WITH DIFFERENTIAL/PLATELET
BASOS PCT: 1 %
Basophils Absolute: 0.1 10*3/uL (ref 0.0–0.1)
EOS PCT: 3 %
Eosinophils Absolute: 0.3 10*3/uL (ref 0.0–0.7)
HEMATOCRIT: 34.4 % — AB (ref 36.0–46.0)
Hemoglobin: 10.7 g/dL — ABNORMAL LOW (ref 12.0–15.0)
Lymphocytes Relative: 27 %
Lymphs Abs: 2 10*3/uL (ref 0.7–4.0)
MCH: 30 pg (ref 26.0–34.0)
MCHC: 31.1 g/dL (ref 30.0–36.0)
MCV: 96.4 fL (ref 78.0–100.0)
Monocytes Absolute: 0.8 10*3/uL (ref 0.1–1.0)
Monocytes Relative: 11 %
NEUTROS ABS: 4.4 10*3/uL (ref 1.7–7.7)
Neutrophils Relative %: 58 %
PLATELETS: 187 10*3/uL (ref 150–400)
RBC: 3.57 MIL/uL — AB (ref 3.87–5.11)
RDW: 14.4 % (ref 11.5–15.5)
WBC: 7.5 10*3/uL (ref 4.0–10.5)

## 2015-01-31 LAB — BASIC METABOLIC PANEL
ANION GAP: 7 (ref 5–15)
BUN: 28 mg/dL — ABNORMAL HIGH (ref 6–20)
CO2: 18 mmol/L — ABNORMAL LOW (ref 22–32)
Calcium: 8.7 mg/dL — ABNORMAL LOW (ref 8.9–10.3)
Chloride: 111 mmol/L (ref 101–111)
Creatinine, Ser: 1.42 mg/dL — ABNORMAL HIGH (ref 0.44–1.00)
GFR calc Af Amer: 41 mL/min — ABNORMAL LOW (ref >60)
GFR, EST NON AFRICAN AMERICAN: 35 mL/min — AB (ref >60)
Glucose, Bld: 121 mg/dL — ABNORMAL HIGH (ref 65–99)
Potassium: 5.8 mmol/L — ABNORMAL HIGH (ref 3.5–5.1)
SODIUM: 136 mmol/L (ref 135–145)

## 2015-01-31 LAB — GLUCOSE, CAPILLARY: Glucose-Capillary: 104 mg/dL — ABNORMAL HIGH (ref 65–99)

## 2015-01-31 LAB — PROTIME-INR
INR: 1.11 (ref 0.00–1.49)
Prothrombin Time: 14.5 seconds (ref 11.6–15.2)

## 2015-01-31 LAB — APTT: APTT: 28 s (ref 24–37)

## 2015-01-31 MED ORDER — FENTANYL CITRATE (PF) 100 MCG/2ML IJ SOLN
INTRAMUSCULAR | Status: AC
  Filled 2015-01-31: qty 4

## 2015-01-31 MED ORDER — SODIUM CHLORIDE 0.9 % IV SOLN: INTRAVENOUS | Status: DC

## 2015-01-31 MED ORDER — LIDOCAINE HCL 1 % IJ SOLN
INTRAMUSCULAR | Status: AC
  Filled 2015-01-31: qty 20

## 2015-01-31 MED ORDER — FENTANYL CITRATE (PF) 100 MCG/2ML IJ SOLN
INTRAMUSCULAR | Status: AC | PRN
  Administered 2015-01-31: 25 ug via INTRAVENOUS

## 2015-01-31 MED ORDER — MIDAZOLAM HCL 2 MG/2ML IJ SOLN
INTRAMUSCULAR | Status: AC
  Filled 2015-01-31: qty 4

## 2015-01-31 MED ORDER — MIDAZOLAM HCL 2 MG/2ML IJ SOLN
INTRAMUSCULAR | Status: AC | PRN
  Administered 2015-01-31: 0.5 mg via INTRAVENOUS

## 2015-01-31 MED ORDER — SODIUM CHLORIDE 0.9 % IV SOLN
INTRAVENOUS | Status: AC | PRN
  Administered 2015-01-31: 10 mL/h via INTRAVENOUS

## 2015-01-31 NOTE — Procedures (Signed)
Interventional Radiology Procedure Note  Procedure: CT guided biopsy of LUL pulmonary nodule Complications: No immediate Recommendations: - Bedrest until CXR cleared.  Minimize talking, coughing or otherwise straining.  - Follow up 2 hr CXR pending   Signed,  Everlean Bucher K. Dann Galicia, MD   

## 2015-01-31 NOTE — H&P (Signed)
Referring Physician(s): Byrum,Robert S  Chief Complaint: Bilateral lung nodules  HPI:  Pt with history of COPD, prior tobacco abuse, CAD/CHF, HTN, DM, OSA on CPAP, PVD with right CIA stent followed by right to left FFBG 2015.Recent imaging studies have revealed enlarging hypermetabolic bilateral lung nodules in right lower and left upper lobes . She has no history of malignancy and presents today for CT guided staged biopsies of the above mentioned lung nodules.   Past Medical History  Diagnosis Date  . Diabetes mellitus without complication ([place])   . Hypertension   . Renal disorder   . Gallstone   . Arthritis   . DVT (deep venous thrombosis) ([place])     2011  . COPD (chronic obstructive pulmonary disease) ([place])   . Pneumonia 2014  . Neuropathy ([place])   . GERD (gastroesophageal reflux disease)   . Constipation   . Anemia   . Hypercholesteremia   . CHF (congestive heart failure) ([place])   . Iron deficiency anemia   . Vitamin B12 deficiency   . PVD (peripheral vascular disease) ([place])   . Osteoarthritis   . CAD (coronary artery disease)    Past Surgical History  Procedure Laterality Date  . Tubal ligation    . Carpal tunnel release Bilateral   . Eye surgery Bilateral     cataract  . Foot surgery Bilateral     bone spurs, and repair  . Carotid endarterectomy Right 1990  . Breast biopsy Left   . Lung biopsy      non cancerous  . Colonoscopy w/ polypectomy    . Femoral-femoral bypass graft  08/07/2013    Procedure: BYPASS GRAFT FEMORAL-FEMORAL ARTERY USING 8MM X 30CM HEMASHIELD GRAFT; LEFT ILIAC ANGIOGRAM; ATTEMPTED LEFT ILIAC ARTERY STENT GRAFT;  Surgeon: Elam Dutch, MD;  Location: [facility];  Service: Vascular;;  . Application of wound vac Bilateral 08/07/2013    Procedure: APPLICATION OF INCISIONAL WOUND [place];  Surgeon: Elam Dutch, MD;  Location: [facility];  Service: Vascular;  Laterality: Bilateral;  . Endarterectomy femoral Left 08/07/2013    Procedure:  ENDARTERECTOMY FEMORAL;  Surgeon: Elam Dutch, MD;  [facility];  Service: Vascular;  Laterality: Left;  . Abdominal aortagram N/A 07/25/2013    Procedure: ABDOMINAL Maxcine Ham;  Surgeon: Serafina Mitchell, MD;  Location: [facility] CATH LAB;  Service: Cardiovascular;  Laterality: N/A;  . Left heart catheterization with coronary angiogram N/A 07/27/2013    Procedure: LEFT HEART CATHETERIZATION WITH CORONARY ANGIOGRAM;  Surgeon: Minus Breeding, MD;  Location: [facility] CATH LAB;  Service: Cardiovascular;  Laterality: N/A;    Allergies: Codeine and Morphine and related  Medications: Prior to Admission medications   Medication Sig Start Date End Date Taking? Authorizing Provider  acetaminophen-codeine (TYLENOL #3) 300-30 MG tablet Take 1 tablet by mouth 2 (two) times daily as needed for moderate pain.  01/08/15  Yes Historical Provider, MD  amLODipine (NORVASC) 10 MG tablet Take 10 mg by mouth daily.   Yes Historical Provider, MD  aspirin EC 81 MG tablet Take 81 mg by mouth daily.   Yes Historical Provider, MD  atorvastatin (LIPITOR) 80 MG tablet Take 80 mg by mouth daily.   Yes Historical Provider, MD  Calcium Carbonate (CALCIUM-CARB 600 PO) Take 600 mg by mouth 2 (two) times daily.   Yes Historical Provider, MD  carvedilol (COREG) 25 MG tablet Take 25 mg by mouth 2 (two) times daily with a meal.   Yes Historical Provider, MD  chlorthalidone (  HYGROTON) 50 MG tablet Take 50 mg by mouth daily.   Yes Historical Provider, MD  escitalopram (LEXAPRO) 20 MG tablet Take 20 mg by mouth daily.   Yes Historical Provider, MD  furosemide (LASIX) 20 MG tablet Take 1 tablet (20 mg total) by mouth daily. Patient taking differently: Take 40 mg by mouth daily.  08/10/13  Yes Erline Hau, MD  gabapentin (NEURONTIN) 100 MG capsule Take 200-300 mg by mouth 2 (two) times daily. 200 mg in the morning and 300 mg at bedtime   Yes Historical Provider, MD  insulin glargine (LANTUS) 100 UNIT/ML injection Inject 20 Units  into the skin at bedtime.    Yes Historical Provider, MD  omeprazole (PRILOSEC) 20 MG capsule Take 20 mg by mouth daily.   Yes Historical Provider, MD  tiotropium (SPIRIVA) 18 MCG inhalation capsule Place 18 mcg into inhaler and inhale daily.   Yes Historical Provider, MD  valsartan (DIOVAN) 80 MG tablet Take 80 mg by mouth daily.   Yes Historical Provider, MD   ROS: denies fevers, chills, HA,CP, abd /back pain, N/V or abnormal bleeding; she does have some dyspnea with exertion, occ cough, LE neuropathy, hearing loss.   Vital Signs: BP 164/59 mmHg  Pulse 51  Resp 18  Ht '5\' 2"'$  (1.575 m)  Wt 194 lb (87.998 kg)  BMI 35.47 kg/m2  SpO2 99%  Physical Exam  Constitutional: She is oriented to person, place, and time. She appears well-developed and well-nourished.  Cardiovascular: Regular rhythm.   Murmur heard. Sl bradycardic but regular rhythm  Pulmonary/Chest: Effort normal and breath sounds normal.  Abdominal: Soft. Bowel sounds are normal. There is no tenderness.  obese  Musculoskeletal: Normal range of motion. She exhibits no edema.  Neurological: She is alert and oriented to person, place, and time.    Imaging: No results found.  Labs:  CBC:  Recent Labs  01/31/15 0907  WBC 7.5  HGB 10.7*  HCT 34.4*  PLT 187    COAGS:  Recent Labs  01/31/15 0907  INR 1.11  APTT 28    BMP:  Recent Labs  01/31/15 0907  NA 136  K 5.8*  CL 111  CO2 18*  GLUCOSE 121*  BUN 28*  CALCIUM 8.7*  CREATININE 1.42*  GFRNONAA 35*  GFRAA 41*    LIVER FUNCTION TESTS: No results for input(s): BILITOT, AST, ALT, ALKPHOS, PROT, ALBUMIN in the last 8760 hours.  Assessment and Plan: Pt with history of COPD, prior tobacco abuse,CAD/CHF, HTN, DM, OSA on CPAP, PVD with right CIA stent followed by right to left FFBG 2015.Recent imaging studies have revealed enlarging hypermetabolic bilateral lung nodules in right lower and left upper lobes . She has no history of malignancy and presents  today for CT guided staged biopsies of the above mentioned lung nodules. Risks and benefits discussed with the patient/daughter including, but not limited to bleeding, infection, pneumothorax requiring chest tube placement, damage to adjacent structures or low yield requiring additional tests and death. All of the patient's questions were answered, patient is agreeable to proceed.Consent signed and in chart.     Signed: D. Rowe Robert 01/31/2015, 10:37 AM   I spent a total of 15 minutes at the the patient's bedside AND on the patient's hospital floor or unit, greater than 50% of which was counseling/coordinating care for CT guided lung biopsy

## 2015-01-31 NOTE — Sedation Documentation (Signed)
Patient is resting comfortably. 

## 2015-01-31 NOTE — Discharge Instructions (Signed)

## 2015-01-31 NOTE — Sedation Documentation (Signed)
Patient denies pain and is resting comfortably.  

## 2015-02-01 ENCOUNTER — Ambulatory Visit (HOSPITAL_COMMUNITY)
Admission: RE | Admit: 2015-02-01 | Discharge: 2015-02-01 | Disposition: A | Discharge status: Home / Self Care | Payer: Medicare Other | Source: Ambulatory Visit | Attending: Emergency Medicine | Admitting: Emergency Medicine

## 2015-02-01 DIAGNOSIS — J95811 Postprocedural pneumothorax: Secondary | ICD-10-CM | POA: Diagnosis not present

## 2015-02-01 DIAGNOSIS — R918 Other nonspecific abnormal finding of lung field: Secondary | ICD-10-CM

## 2015-02-01 DIAGNOSIS — Z87891 Personal history of nicotine dependence: Secondary | ICD-10-CM | POA: Diagnosis not present

## 2015-02-01 DIAGNOSIS — J449 Chronic obstructive pulmonary disease, unspecified: Secondary | ICD-10-CM | POA: Insufficient documentation

## 2015-02-01 DIAGNOSIS — Z79899 Other long term (current) drug therapy: Secondary | ICD-10-CM | POA: Insufficient documentation

## 2015-02-01 DIAGNOSIS — E7800 Pure hypercholesterolemia, unspecified: Secondary | ICD-10-CM | POA: Diagnosis not present

## 2015-02-01 DIAGNOSIS — Z8249 Family history of ischemic heart disease and other diseases of the circulatory system: Secondary | ICD-10-CM | POA: Insufficient documentation

## 2015-02-01 DIAGNOSIS — Z885 Allergy status to narcotic agent status: Secondary | ICD-10-CM | POA: Insufficient documentation

## 2015-02-01 DIAGNOSIS — C3431 Malignant neoplasm of lower lobe, right bronchus or lung: Secondary | ICD-10-CM | POA: Diagnosis not present

## 2015-02-01 DIAGNOSIS — K219 Gastro-esophageal reflux disease without esophagitis: Secondary | ICD-10-CM | POA: Diagnosis not present

## 2015-02-01 DIAGNOSIS — Z7982 Long term (current) use of aspirin: Secondary | ICD-10-CM | POA: Insufficient documentation

## 2015-02-01 DIAGNOSIS — E1140 Type 2 diabetes mellitus with diabetic neuropathy, unspecified: Secondary | ICD-10-CM | POA: Diagnosis not present

## 2015-02-01 DIAGNOSIS — I739 Peripheral vascular disease, unspecified: Secondary | ICD-10-CM | POA: Diagnosis not present

## 2015-02-01 DIAGNOSIS — I10 Essential (primary) hypertension: Secondary | ICD-10-CM | POA: Insufficient documentation

## 2015-02-01 DIAGNOSIS — J9383 Other pneumothorax: Secondary | ICD-10-CM | POA: Diagnosis not present

## 2015-02-01 DIAGNOSIS — Z86718 Personal history of other venous thrombosis and embolism: Secondary | ICD-10-CM | POA: Insufficient documentation

## 2015-02-01 DIAGNOSIS — Y848 Other medical procedures as the cause of abnormal reaction of the patient, or of later complication, without mention of misadventure at the time of the procedure: Secondary | ICD-10-CM | POA: Diagnosis not present

## 2015-02-01 DIAGNOSIS — R911 Solitary pulmonary nodule: Secondary | ICD-10-CM | POA: Diagnosis not present

## 2015-02-01 DIAGNOSIS — Z794 Long term (current) use of insulin: Secondary | ICD-10-CM | POA: Insufficient documentation

## 2015-02-01 DIAGNOSIS — I2510 Atherosclerotic heart disease of native coronary artery without angina pectoris: Secondary | ICD-10-CM | POA: Diagnosis not present

## 2015-02-01 LAB — GLUCOSE, CAPILLARY: Glucose-Capillary: 120 mg/dL — ABNORMAL HIGH (ref 65–99)

## 2015-02-01 MED ORDER — MIDAZOLAM HCL 2 MG/2ML IJ SOLN
INTRAMUSCULAR | Status: AC
  Filled 2015-02-01: qty 2

## 2015-02-01 MED ORDER — SODIUM CHLORIDE 0.9 % IV SOLN: Freq: Once | INTRAVENOUS | Status: DC

## 2015-02-01 MED ORDER — LIDOCAINE HCL 1 % IJ SOLN
INTRAMUSCULAR | Status: AC
  Filled 2015-02-01: qty 20

## 2015-02-01 MED ORDER — FENTANYL CITRATE (PF) 100 MCG/2ML IJ SOLN
INTRAMUSCULAR | Status: AC
  Filled 2015-02-01: qty 2

## 2015-02-01 MED ORDER — ACETAMINOPHEN 325 MG PO TABS
ORAL_TABLET | ORAL | Status: AC
  Administered 2015-02-01: 650 mg via ORAL
  Filled 2015-02-01: qty 2

## 2015-02-01 MED ORDER — ACETAMINOPHEN 325 MG PO TABS
650.0000 mg | ORAL_TABLET | ORAL | Status: DC | PRN
  Administered 2015-02-01: 650 mg via ORAL
  Filled 2015-02-01: qty 2

## 2015-02-01 MED ORDER — FENTANYL CITRATE (PF) 100 MCG/2ML IJ SOLN
INTRAMUSCULAR | Status: AC | PRN
  Administered 2015-02-01: 25 ug via INTRAVENOUS
  Administered 2015-02-01: 12.5 ug via INTRAVENOUS
  Administered 2015-02-01: 25 ug via INTRAVENOUS

## 2015-02-01 MED ORDER — MIDAZOLAM HCL 2 MG/2ML IJ SOLN
INTRAMUSCULAR | Status: AC | PRN
  Administered 2015-02-01 (×3): 0.5 mg via INTRAVENOUS

## 2015-02-01 NOTE — Sedation Documentation (Signed)
Patient is resting comfortably. 

## 2015-02-01 NOTE — H&P (Signed)
Chief Complaint: Patient was seen in consultation today for Right lung mass biopsy at the request of Byrum,Robert S  Referring Physician(s): Byrum,Robert S  History of Present Illness: Natalie Porter is a 74 y.o. female   Pt with COPD Previous smoker Followed with Dr Lamonte Sakai Has followed B pulmonary nodules since 2013 New CT at [facility] noted enlargement And +PET 01/02/2015 LUL mass was biopsied yesterday---pt tolerated well Now plan for Right low lobe mass biopsy today  Per Dr Lamonte Sakai request  Past Medical History  Diagnosis Date  . Diabetes mellitus without complication ([place])   . Hypertension   . Renal disorder   . Gallstone   . Arthritis   . DVT (deep venous thrombosis) ([place])     2011  . COPD (chronic obstructive pulmonary disease) ([place])   . Pneumonia 2014  . Neuropathy ([place])   . GERD (gastroesophageal reflux disease)   . Constipation   . Anemia   . Hypercholesteremia   . CHF (congestive heart failure) ([place])   . Iron deficiency anemia   . Vitamin B12 deficiency   . PVD (peripheral vascular disease) ([place])   . Osteoarthritis   . CAD (coronary artery disease)     Past Surgical History  Procedure Laterality Date  . Tubal ligation    . Carpal tunnel release Bilateral   . Eye surgery Bilateral     cataract  . Foot surgery Bilateral     bone spurs, and repair  . Carotid endarterectomy Right 1990  . Breast biopsy Left   . Lung biopsy      non cancerous  . Colonoscopy w/ polypectomy    . Femoral-femoral bypass graft  08/07/2013    Procedure: BYPASS GRAFT FEMORAL-FEMORAL ARTERY USING 8MM X 30CM HEMASHIELD GRAFT; LEFT ILIAC ANGIOGRAM; ATTEMPTED LEFT ILIAC ARTERY STENT GRAFT;  Surgeon: Elam Dutch, MD;  Location: [place];  Service: Vascular;;  . Application of wound vac Bilateral 08/07/2013    Procedure: APPLICATION OF INCISIONAL WOUND [place];  Surgeon: Elam Dutch, MD;  Location: [place];  Service: Vascular;  Laterality: Bilateral;  . Endarterectomy  femoral Left 08/07/2013    Procedure: ENDARTERECTOMY FEMORAL;  Surgeon: Elam Dutch, MD;  Location: [place];  Service: Vascular;  Laterality: Left;  . Abdominal aortagram N/A 07/25/2013    Procedure: ABDOMINAL Maxcine [place];  Surgeon: Serafina Mitchell, MD;  Location: [facility] CATH LAB;  Service: Cardiovascular;  Laterality: N/A;  . Left heart catheterization with coronary angiogram N/A 07/27/2013    Procedure: LEFT HEART CATHETERIZATION WITH CORONARY ANGIOGRAM;  Surgeon: Minus Breeding, MD;  Location: [facility] CATH LAB;  Service: Cardiovascular;  Laterality: N/A;    Allergies: Codeine and Morphine and related  Medications: Prior to Admission medications   Medication Sig Start Date End Date Taking? Authorizing Provider  acetaminophen-codeine (TYLENOL #3) 300-30 MG tablet Take 1 tablet by mouth 2 (two) times daily as needed for moderate pain.  01/08/15  Yes Historical Provider, MD  amLODipine (NORVASC) 10 MG tablet Take 10 mg by mouth daily.   Yes Historical Provider, MD  aspirin EC 81 MG tablet Take 81 mg by mouth daily.   Yes Historical Provider, MD  atorvastatin (LIPITOR) 80 MG tablet Take 80 mg by mouth daily.   Yes Historical Provider, MD  Calcium Carbonate (CALCIUM-CARB 600 PO) Take 600 mg by mouth 2 (two) times daily.   Yes Historical Provider, MD  carvedilol (COREG) 25 MG tablet Take 25 mg by mouth 2 (two) times daily with a meal.  Yes Historical Provider, MD  chlorthalidone (HYGROTON) 50 MG tablet Take 50 mg by mouth daily.   Yes Historical Provider, MD  escitalopram (LEXAPRO) 20 MG tablet Take 20 mg by mouth daily.   Yes Historical Provider, MD  furosemide (LASIX) 20 MG tablet Take 1 tablet (20 mg total) by mouth daily. Patient taking differently: Take 40 mg by mouth daily.  08/10/13  Yes Erline Hau, MD  gabapentin (NEURONTIN) 100 MG capsule Take 200-300 mg by mouth 2 (two) times daily. 200 mg in the morning and 300 mg at bedtime   Yes Historical Provider, MD  insulin glargine (LANTUS)  100 UNIT/ML injection Inject 20 Units into the skin at bedtime.    Yes Historical Provider, MD  omeprazole (PRILOSEC) 20 MG capsule Take 20 mg by mouth daily.   Yes Historical Provider, MD  tiotropium (SPIRIVA) 18 MCG inhalation capsule Place 18 mcg into inhaler and inhale daily.   Yes Historical Provider, MD  valsartan (DIOVAN) 80 MG tablet Take 80 mg by mouth daily.   Yes Historical Provider, MD     Family History  Problem Relation Age of Onset  . Heart attack Mother   . Stroke Father   . Heart attack Father   . Cancer Sister     [name]  . Cancer Brother   . Cancer Sister     Lung    Social History   Social History  . Marital Status: Widowed    Spouse Name: N/A  . Number of Children: N/A  . Years of Education: N/A   Social History Main Topics  . Smoking status: Former Smoker -- 1.00 packs/day for 34 years    Types: Cigarettes    Quit date: 03/28/1988  . Smokeless tobacco: Never Used  . Alcohol Use: No  . Drug Use: No  . Sexual Activity: Not on file     Comment: quit 1990   Other Topics Concern  . Not on file   Social History Narrative   Lives alone. Widowed.   Retired.    Review of Systems: A 12 point ROS discussed and pertinent positives are indicated in the HPI above.  All other systems are negative.  Review of Systems  Constitutional: Negative for fever and activity change.  Respiratory: Positive for wheezing. Negative for shortness of breath.   Neurological: Negative for weakness.  Psychiatric/Behavioral: Negative for behavioral problems and confusion.    Vital Signs: BP 146/58 mmHg  Pulse 54  Temp(Src) 98.2 F (36.8 C)  Ht '5\' 2"'$  (1.575 m)  Wt 194 lb (87.998 kg)  BMI 35.47 kg/m2  SpO2 98%  Physical Exam  Constitutional: She is oriented to person, place, and time.  Cardiovascular: Normal rate and regular rhythm.   Murmur heard. Pulmonary/Chest: Effort normal and breath sounds normal. She has no wheezes.  Abdominal: Soft. Bowel sounds are  normal.  Musculoskeletal: Normal range of motion.  Neurological: She is alert and oriented to person, place, and time.  Skin: Skin is warm and dry.  Psychiatric: She has a normal mood and affect. Her behavior is normal. Judgment and thought content normal.  Nursing note and vitals reviewed.   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 She presents for the first of two staged biopsies.  Today she will undergo biopsy of the nodule in the left upper lobe. EXAM: CT BIOPSY Date: 01/31/2015 PROCEDURE: 1. CT-guided biopsy left upper lobe pulmonary nodule Interventional Radiologist:  Criselda Peaches, MD ANESTHESIA/SEDATION: Moderate (conscious) sedation was used. 0.5 mg Versed, 50 mcg Fentanyl were administered intravenously. The patient's vital signs were monitored continuously by radiology nursing throughout the procedure. Sedation Time: 15 minutes MEDICATIONS: None additional TECHNIQUE: Informed consent was obtained from the patient following explanation of the procedure, risks, benefits and alternatives. The patient understands, agrees and consents for the procedure. All questions were addressed. A time out was performed. A planning axial CT scan was performed. The nodule in the left upper lobe was successfully identified. A suitable skin entry site was selected and marked. The region was then sterilely prepped and draped in standard fashion using Betadine skin prep. Local anesthesia was attained by infiltration with 1% lidocaine. A small dermatotomy was made. Under intermittent CT fluoroscopic guidance, a 17 gauge trocar needle was advanced into the lung and positioned at the margin of the nodule. Multiple 18 gauge core  biopsies were then coaxially obtained using the BioPince automated biopsy device. Biopsy specimens were placed in formalin and delivered to pathology for further analysis. The biopsy device and introducer needle were removed. Post biopsy axial CT imaging demonstrates no evidence of immediate complication. There is no pneumothorax. Mild perilesional alveolar hemorrhage is not unexpected. The patient tolerated the procedure well. COMPLICATIONS: None Estimated blood loss:  0 IMPRESSION: Technically successful CT-guided biopsy left upper lobe pulmonary nodule. Signed, Criselda Peaches, MD [facility] Radiology Electronically Signed   By: Jacqulynn Cadet M.D.   On: 01/31/2015 13:02   Dg Chest Port 1 View  01/31/2015  CLINICAL DATA:  74 year old who underwent biopsy of a left upper lobe lung nodule earlier today. EXAM: PORTABLE CHEST 1 VIEW COMPARISON:  PET-CT 01/02/2015. CT chest 01/02/2015 and earlier. Chest x-rays 09/18/2013 and earlier. FINDINGS: No evidence of pneumothorax after biopsy of a left upper lobe lung nodule. Minimal patchy airspace opacity at the left apex consistent with hemorrhage at the biopsy site, as noted on the post biopsy CT images earlier today. Mild atelectasis in the lower lobes. Lungs otherwise clear. Stable mild chronic pulmonary venous hypertension without overt edema. Cardiac silhouette moderately enlarged, unchanged. Left upper lobe metallic foreign body unchanged. IMPRESSION: 1. No pneumothorax after biopsy of a left apical lung nodule. 2. Expected post biopsy hemorrhage in the left apex at the biopsy site. 3. Mild bibasilar atelectasis. No acute cardiopulmonary disease otherwise. Electronically Signed   By: Evangeline Dakin M.D.   On: 01/31/2015 15:03    Labs:  CBC:  Recent Labs  01/31/15 0907  WBC 7.5  HGB 10.7*  HCT 34.4*  PLT 187    COAGS:  Recent Labs  01/31/15 0907  INR 1.11  APTT 28    BMP:  Recent  Labs  01/31/15 0907  NA 136  K 5.8*  CL 111  CO2 18*  GLUCOSE 121*  BUN 28*  CALCIUM 8.7*  CREATININE 1.42*  GFRNONAA 35*  GFRAA 41*    LIVER FUNCTION TESTS: No results for input(s): BILITOT, AST, ALT, ALKPHOS, PROT, ALBUMIN in the last 8760 hours.  TUMOR MARKERS: No results for input(s): AFPTM, CEA, CA199, CHROMGRNA in the last 8760 hours.  Assessment and Plan:  Bilateral pulmonary nodules followed since 2013 Known COPD and previous smoker Recent CT revealed enlarging nodules +PET 01/02/2015 Bx of LUL mass 01/31/15---tolerated well Now  scheduled for RLL mass bx today per Dr Lamonte Sakai request Risks and Benefits discussed with the patient including, but not limited to bleeding, hemoptysis, respiratory failure requiring intubation, infection, pneumothorax requiring chest tube placement, stroke from air embolism or even death. All of the patient's questions were answered, patient is agreeable to proceed. Consent signed and in chart.   Thank you for this interesting consult.  I greatly enjoyed meeting Natalie Porter and look forward to participating in their care.  A copy of this report was sent to the requesting provider on this date.  Signed: Tayon Parekh A 02/01/2015, 9:51 AM   I spent a total of  30 Minutes   in face to face in clinical consultation, greater than 50% of which was counseling/coordinating care for Right lung mass biopsy

## 2015-02-01 NOTE — Sedation Documentation (Signed)
Vital signs stable. 

## 2015-02-01 NOTE — Discharge Instructions (Signed)

## 2015-02-01 NOTE — Procedures (Signed)
S/p CT guided RLL nodule bx,(difficult bx because of resp motion and overlying ribs) Minor ptx and focal pulmonary hemorrhage from the bx Path pending Full report in pacs

## 2015-02-04 DIAGNOSIS — E569 Vitamin deficiency, unspecified: Secondary | ICD-10-CM | POA: Diagnosis not present

## 2015-02-04 DIAGNOSIS — E119 Type 2 diabetes mellitus without complications: Secondary | ICD-10-CM | POA: Diagnosis not present

## 2015-02-04 DIAGNOSIS — E785 Hyperlipidemia, unspecified: Secondary | ICD-10-CM | POA: Diagnosis not present

## 2015-02-04 DIAGNOSIS — E663 Overweight: Secondary | ICD-10-CM | POA: Diagnosis not present

## 2015-02-06 ENCOUNTER — Telehealth: Payer: Self-pay | Admitting: Emergency Medicine

## 2015-02-06 DIAGNOSIS — C3491 Malignant neoplasm of unspecified part of right bronchus or lung: Secondary | ICD-10-CM

## 2015-02-06 NOTE — Telephone Encounter (Signed)
Called and spoke to pt's daughter, Natalie Porter. Natalie Porter questioned if the lung cancer is adenocarcinoma, confirmed this with Natalie Porter per RB's note. Natalie Porter verbalized understanding so she is able to explain the results to the pt.    Collene Gobble, MD at 02/06/2015 11:44 AM     Status: Signed       Expand All Collapse All   Reviewed the results with Natalie Porter who will relay to the patient today. R bx shows invasive adenoCA, L bx shows glandular atypia but was not definitive for adenoCA. I suspect both sides are malignant but L sided results are not certain. Patient would like to have her Oncology referral at [facility]. i will make the referral today

## 2015-02-06 NOTE — Telephone Encounter (Signed)
Reviewed the results with Horris Latino who will relay to the patient today. R bx shows invasive adenoCA, L bx shows glandular atypia but was not definitive for adenoCA. I suspect both sides are malignant but L sided results are not certain. Patient would like to have her Oncology referral at  cancer center. i will make the referral today

## 2015-02-15 DIAGNOSIS — C3412 Malignant neoplasm of upper lobe, left bronchus or lung: Secondary | ICD-10-CM | POA: Diagnosis not present

## 2015-02-15 DIAGNOSIS — E875 Hyperkalemia: Secondary | ICD-10-CM | POA: Diagnosis not present

## 2015-02-15 DIAGNOSIS — N390 Urinary tract infection, site not specified: Secondary | ICD-10-CM

## 2015-02-15 DIAGNOSIS — R978 Other abnormal tumor markers: Secondary | ICD-10-CM | POA: Diagnosis not present

## 2015-02-15 DIAGNOSIS — D649 Anemia, unspecified: Secondary | ICD-10-CM | POA: Diagnosis not present

## 2015-02-15 DIAGNOSIS — C3431 Malignant neoplasm of lower lobe, right bronchus or lung: Secondary | ICD-10-CM | POA: Diagnosis not present

## 2015-02-15 DIAGNOSIS — R300 Dysuria: Secondary | ICD-10-CM | POA: Diagnosis not present

## 2015-02-15 DIAGNOSIS — Z7901 Long term (current) use of anticoagulants: Secondary | ICD-10-CM | POA: Diagnosis not present

## 2015-02-19 ENCOUNTER — Encounter: Payer: Self-pay | Admitting: Vascular Surgery

## 2015-02-19 DIAGNOSIS — I110 Hypertensive heart disease with heart failure: Secondary | ICD-10-CM | POA: Diagnosis not present

## 2015-02-19 DIAGNOSIS — Z87891 Personal history of nicotine dependence: Secondary | ICD-10-CM | POA: Diagnosis not present

## 2015-02-19 DIAGNOSIS — L97821 Non-pressure chronic ulcer of other part of left lower leg limited to breakdown of skin: Secondary | ICD-10-CM | POA: Diagnosis not present

## 2015-02-19 DIAGNOSIS — M79605 Pain in left leg: Secondary | ICD-10-CM | POA: Diagnosis not present

## 2015-02-19 DIAGNOSIS — J449 Chronic obstructive pulmonary disease, unspecified: Secondary | ICD-10-CM | POA: Diagnosis not present

## 2015-02-19 DIAGNOSIS — I509 Heart failure, unspecified: Secondary | ICD-10-CM | POA: Diagnosis not present

## 2015-02-19 DIAGNOSIS — L97222 Non-pressure chronic ulcer of left calf with fat layer exposed: Secondary | ICD-10-CM | POA: Diagnosis not present

## 2015-02-19 DIAGNOSIS — I739 Peripheral vascular disease, unspecified: Secondary | ICD-10-CM | POA: Diagnosis not present

## 2015-02-19 DIAGNOSIS — M1990 Unspecified osteoarthritis, unspecified site: Secondary | ICD-10-CM | POA: Diagnosis not present

## 2015-02-19 DIAGNOSIS — G4730 Sleep apnea, unspecified: Secondary | ICD-10-CM | POA: Diagnosis not present

## 2015-02-19 DIAGNOSIS — S8012XA Contusion of left lower leg, initial encounter: Secondary | ICD-10-CM | POA: Diagnosis not present

## 2015-02-19 DIAGNOSIS — G629 Polyneuropathy, unspecified: Secondary | ICD-10-CM | POA: Diagnosis not present

## 2015-02-19 DIAGNOSIS — Z794 Long term (current) use of insulin: Secondary | ICD-10-CM | POA: Diagnosis not present

## 2015-02-19 DIAGNOSIS — E11622 Type 2 diabetes mellitus with other skin ulcer: Secondary | ICD-10-CM | POA: Diagnosis not present

## 2015-02-19 DIAGNOSIS — I2510 Atherosclerotic heart disease of native coronary artery without angina pectoris: Secondary | ICD-10-CM | POA: Diagnosis not present

## 2015-02-20 DIAGNOSIS — R921 Mammographic calcification found on diagnostic imaging of breast: Secondary | ICD-10-CM | POA: Diagnosis not present

## 2015-02-20 DIAGNOSIS — N649 Disorder of breast, unspecified: Secondary | ICD-10-CM | POA: Diagnosis not present

## 2015-02-21 ENCOUNTER — Ambulatory Visit (INDEPENDENT_AMBULATORY_CARE_PROVIDER_SITE_OTHER): Payer: Medicare Other | Admitting: Vascular Surgery

## 2015-02-21 ENCOUNTER — Encounter: Payer: Self-pay | Admitting: Vascular Surgery

## 2015-02-21 ENCOUNTER — Ambulatory Visit
Admission: RE | Admit: 2015-02-21 | Discharge: 2015-02-21 | Disposition: A | Discharge status: Home / Self Care | Payer: Medicare Other | Source: Ambulatory Visit | Attending: Family | Admitting: Family

## 2015-02-21 VITALS — BP 180/68 | HR 68 | Temp 97.5°F | Resp 20 | Ht 61.0 in | Wt 202.0 lb

## 2015-02-21 DIAGNOSIS — I6523 Occlusion and stenosis of bilateral carotid arteries: Secondary | ICD-10-CM

## 2015-02-21 DIAGNOSIS — I739 Peripheral vascular disease, unspecified: Secondary | ICD-10-CM

## 2015-02-21 DIAGNOSIS — Z95828 Presence of other vascular implants and grafts: Secondary | ICD-10-CM

## 2015-02-21 DIAGNOSIS — Z48812 Encounter for surgical aftercare following surgery on the circulatory system: Secondary | ICD-10-CM

## 2015-02-21 DIAGNOSIS — Z9889 Other specified postprocedural states: Secondary | ICD-10-CM

## 2015-02-21 DIAGNOSIS — R52 Pain, unspecified: Secondary | ICD-10-CM | POA: Diagnosis not present

## 2015-02-21 DIAGNOSIS — Z87891 Personal history of nicotine dependence: Secondary | ICD-10-CM

## 2015-02-21 MED ORDER — IOPAMIDOL (ISOVUE-370) INJECTION 76%
100.0000 mL | Freq: Once | INTRAVENOUS | Status: AC | PRN
  Administered 2015-02-21: 100 mL via INTRAVENOUS

## 2015-02-21 NOTE — Progress Notes (Signed)
[facility] and [facility]in Specialist of [facility]  Patient name: Natalie Porter MRN: [id number] DOB: 08-20-1940 Sex: female  REASON FOR VISIT: follow-up CTA  HPI: Natalie Porter is a 74 y.o. female who returns for follow-up following CTA aorta with runoff. She is s/p right to left femoral-femoral bypass and right common iliac artery by Dr. Oneida Alar. She was last seen by Vinnie Level Nickel on 01/17/15 who noted elevated velocities within the right common iliac artery stent. She was sent for a CTA for further evaluation. She is minimally ambulatory and reports swelling in her legs.   The patient reports increased shortness of breath today. She says she has been diagnosed with a lung nodule but does not think this is related to her dyspnea. She has congestive heart failure but would prefer to see another cardiologist for management.   Past Medical History  Diagnosis Date  . Diabetes mellitus without complication ([place])   . Hypertension   . Renal disorder   . Gallstone   . Arthritis   . DVT (deep venous thrombosis) ([place])     2011  . COPD (chronic obstructive pulmonary disease) ([place])   . Pneumonia 2014  . Neuropathy ([place])   . GERD (gastroesophageal reflux disease)   . Constipation   . Anemia   . Hypercholesteremia   . CHF (congestive heart failure) ([place])   . Iron deficiency anemia   . Vitamin B12 deficiency   . PVD (peripheral vascular disease) ([place])   . Osteoarthritis   . CAD (coronary artery disease)     Family History  Problem Relation Age of Onset  . Heart attack Mother   . Stroke Father   . Heart attack Father   . Cancer Sister     Natalie Porter  . Cancer Brother   . Cancer Sister     Lung    SOCIAL HISTORY: Social History  Substance Use Topics  . Smoking status: Former Smoker -- 1.00 packs/day for 34 years    Types: Cigarettes    Quit date: 03/28/1988  . Smokeless tobacco: Never Used  . Alcohol Use: No    Allergies  Allergen Reactions  . Codeine Nausea Only  . Morphine  And Related Nausea And Vomiting    Current Outpatient Prescriptions  Medication Sig Dispense Refill  . acetaminophen-codeine (TYLENOL #3) 300-30 MG tablet Take 1 tablet by mouth 2 (two) times daily as needed for moderate pain.   0  . amLODipine (NORVASC) 10 MG tablet Take 10 mg by mouth daily.    Marland Kitchen aspirin EC 81 MG tablet Take 81 mg by mouth daily.    Marland Kitchen atorvastatin (LIPITOR) 80 MG tablet Take 80 mg by mouth daily.    . Calcium Carbonate (CALCIUM-CARB 600 PO) Take 600 mg by mouth 2 (two) times daily.    . carvedilol (COREG) 25 MG tablet Take 25 mg by mouth 2 (two) times daily with a meal.    . chlorthalidone (HYGROTON) 50 MG tablet Take 50 mg by mouth daily.    . ciprofloxacin (CIPRO) 500 MG tablet Take 500 mg by mouth 2 (two) times daily.    Marland Kitchen escitalopram (LEXAPRO) 20 MG tablet Take 20 mg by mouth daily.    . furosemide (LASIX) 20 MG tablet Take 1 tablet (20 mg total) by mouth daily. (Patient taking differently: Take 40 mg by mouth daily. ) 30 tablet 1  . gabapentin (NEURONTIN) 100 MG capsule Take 200-300 mg by mouth 2 (two) times daily. 200 mg in the morning and 300 mg  at bedtime    . insulin glargine (LANTUS) 100 UNIT/ML injection Inject 20 Units into the skin at bedtime.     Marland Kitchen omeprazole (PRILOSEC) 20 MG capsule Take 20 mg by mouth daily.    Marland Kitchen tiotropium (SPIRIVA) 18 MCG inhalation capsule Place 18 mcg into inhaler and inhale daily.    . valsartan (DIOVAN) 80 MG tablet Take 80 mg by mouth daily.     No current facility-administered medications for this visit.    REVIEW OF SYSTEMS:  '[X]'$  denotes positive finding, '[ ]'$  denotes negative finding Cardiac  Comments:  Chest pain or chest pressure:    Shortness of breath upon exertion: x   Short of breath when lying flat:    Irregular heart rhythm:        Vascular    Pain in calf, thigh, or hip brought on by ambulation:    Pain in feet at night that wakes you up from your sleep:     Blood clot in your veins:    Leg swelling:           Pulmonary    Oxygen at home:    Productive cough:     Wheezing:         Neurologic    Sudden weakness in arms or legs:     Sudden numbness in arms or legs:     Sudden onset of difficulty speaking or slurred speech:    Temporary loss of vision in one eye:     Problems with dizziness:         Gastrointestinal    Blood in stool:     Vomited blood:         Genitourinary    Burning when urinating:     Blood in urine:        Psychiatric    Major depression:         Hematologic    Bleeding problems:    Problems with blood clotting too easily:        Skin    Rashes or ulcers:        Constitutional    Fever or chills:      PHYSICAL EXAM: Filed Vitals:   02/21/15 1425  BP: 180/68  Pulse: 68  Temp: 97.5 F (36.4 C)  Resp: 20  Height: '5\' 1"'$  (1.549 m)  Weight: 202 lb (91.627 kg)  SpO2: 97%    GENERAL: The patient is a well-nourished female, in no acute distress. The vital signs are documented above. VASCULAR: faintly palpable dorsalis pedis pulses bilaterally PULMONARY: Non labored breathing NEUROLOGIC: No focal weakness or paresthesias are detected. PSYCHIATRIC: The patient has a normal affect.  DATA:  CTA Ao/Bifem 02/21/15  Patent right to left femoral-femoral bypass graft. Left common iliac artery occlusion.  Right common iliac artery stent is patent.   Three vessel runoff bilaterally    MEDICAL ISSUES:  Peripheral vascular disease  S/p right to left femoral-femoral bypass 08/08/14, right common iliac stent  CTA aorta-bifem performed to evaluate bypass given elevated velocities throughout right common iliac stent on aortoiliac duplex (01/17/15). Her CTA reveals a patent right common iliac artery stent and patent right to left femoral-femoral bypass.   She will follow up in one year with repeat ABIs.   Dyspnea Likely secondary to congestive heart failure. The patient prefers to see another cardiologist for management. The daughter specified that they  would like to see Dr. Percival Spanish as he is who performed her cardiac catheterization during her  last hospitalization back in May 2015. Will refer her to Dr. Percival Spanish.    Multiple lung nodules  Followed by Dr. Lamonte Sakai who has referred her to oncology.  Alvia Grove, PA-C [facility] [place]   history and exam details as above. Widely patent right common iliac stent femoral-femoral bypass. She is asymptomatic. She will follow-up in one year.  Ruta Hinds, MD [facility] of [place] Office: [phone] Pager: [phone]

## 2015-02-21 NOTE — Addendum Note (Signed)
Addended by: Dorthula Rue L on: 02/21/2015 05:05 PM   Modules accepted: Orders

## 2015-02-22 ENCOUNTER — Ambulatory Visit: Payer: Medicare Other | Admitting: Emergency Medicine

## 2015-02-28 ENCOUNTER — Encounter: Payer: Self-pay | Admitting: Cardiology

## 2015-02-28 ENCOUNTER — Ambulatory Visit (INDEPENDENT_AMBULATORY_CARE_PROVIDER_SITE_OTHER): Payer: Medicare Other | Admitting: Cardiology

## 2015-02-28 VITALS — BP 158/62 | HR 47 | Ht 61.0 in | Wt 193.2 lb

## 2015-02-28 DIAGNOSIS — E785 Hyperlipidemia, unspecified: Secondary | ICD-10-CM | POA: Diagnosis not present

## 2015-02-28 DIAGNOSIS — Z79899 Other long term (current) drug therapy: Secondary | ICD-10-CM | POA: Diagnosis not present

## 2015-02-28 DIAGNOSIS — R0602 Shortness of breath: Secondary | ICD-10-CM | POA: Diagnosis not present

## 2015-02-28 DIAGNOSIS — I6523 Occlusion and stenosis of bilateral carotid arteries: Secondary | ICD-10-CM

## 2015-02-28 DIAGNOSIS — G4730 Sleep apnea, unspecified: Secondary | ICD-10-CM | POA: Diagnosis not present

## 2015-02-28 MED ORDER — FUROSEMIDE 20 MG PO TABS: 60.0000 mg | ORAL_TABLET | Freq: Every day | ORAL | Status: DC

## 2015-02-28 NOTE — Patient Instructions (Addendum)
Your physician has recommended that you have a sleep study. This test records several body functions during sleep, including: brain activity, eye movement, oxygen and carbon dioxide blood levels, heart rate and rhythm, breathing rate and rhythm, the flow of air through your mouth and nose, snoring, body muscle movements, and chest and belly movement.  Your physician has recommended you make the following change in your medication: Take 40 mg Lasix and extra 20 mg if needed  Your physician recommends that you return for lab work in: BNP and BMP  Merry Christmas and happy New Year!!

## 2015-02-28 NOTE — Progress Notes (Addendum)
Cardiology Office Note   Date:  02/28/2015   ID:  Natalie Porter, DOB 1941-03-13, MRN [id number]  PCP:  Cyndy Freeze, MD  Cardiologist:   Minus Breeding, MD   Chief Complaint  Patient presents with  . Chest Pain      History of Present Illness: Natalie Porter is a 74 y.o. female who presents for follow-up of known coronary disease. She had previously established with another cardiology group. I had seen her in the past but she had not been following with [place]. I had done a catheterization on her only. She is now reestablishing as a new patient with our practice. She has peripheral vascular disease and did have an abnormal stress test last year with some anterior ischemia.  She has a cardiac catheterization with disease as described below. This was thought to be nonobstructive and managed medically. Echocardiography demonstrated well-preserved ejection fraction with an EF of 65%. She still has had heart failure with this preserved ejection fraction. She says her legs are better since bypass surgery. However, she does have chronic dyspnea. She says this is slowly progressive since last her. She is short of breath doing mild activities. She's mostly limited by back and leg pain. She gets around at the grocery store and a scooter. She gets around in her house slowly ambulating. She denies any chest pressure, neck or arm discomfort. She doesn't have palpitations, presyncope or syncope. She has no PND or orthopnea.    Past Medical History  Diagnosis Date  . Diabetes mellitus without complication ([place])   . Hypertension   . Renal disorder   . Gallstone   . Arthritis   . DVT (deep venous thrombosis) ([place])     2011  . COPD (chronic obstructive pulmonary disease) ([place])   . Pneumonia 2014  . Neuropathy ([place])   . GERD (gastroesophageal reflux disease)   . Anemia   . Hypercholesteremia   . CHF (congestive heart failure) (H[facility])     Preserved EF.    . Iron deficiency anemia   .  Vitamin B12 deficiency   . PVD (peripheral vascular disease) ([place])   . Osteoarthritis   . CAD (coronary artery disease)     Cath 2015, 50% calcified LAD, 60% circumflex stenosis, right coronary artery 70% stenosis medical management  . Lung cancer [facility])     New diagnosis (treated at [facility]).  Going to receive radiation  . Sleep apnea     CPAP    Past Surgical History  Procedure Laterality Date  . Tubal ligation    . Carpal tunnel release Bilateral   . Eye surgery Bilateral     cataract  . Foot surgery Bilateral     bone spurs, and repair  . Carotid endarterectomy Right 1990  . Breast biopsy Left   . Lung biopsy      non cancerous  . Colonoscopy w/ polypectomy    . Femoral-femoral bypass graft  08/07/2013    Procedure: BYPASS GRAFT FEMORAL-FEMORAL ARTERY USING 8MM X 30CM HEMASHIELD GRAFT; LEFT ILIAC ANGIOGRAM; ATTEMPTED LEFT ILIAC ARTERY STENT GRAFT;  Surgeon: Elam Dutch, MD;  Location: [place];  Service: Vascular;;  . Application of wound vac Bilateral 08/07/2013    Procedure: APPLICATION OF INCISIONAL WOUND [place];  Surgeon: Elam Dutch, MD;  Location: [place];  Service: Vascular;  Laterality: Bilateral;  . Endarterectomy femoral Left 08/07/2013    Procedure: ENDARTERECTOMY FEMORAL;  Surgeon: Elam Dutch, MD;  Location: [place];  Service: Vascular;  Laterality: Left;  . Abdominal aortagram N/A 07/25/2013    Procedure: ABDOMINAL Maxcine Ham;  Surgeon: Serafina Mitchell, MD;  Location: [facility] CATH LAB;  Service: Cardiovascular;  Laterality: N/A;  . Left heart catheterization with coronary angiogram N/A 07/27/2013    Procedure: LEFT HEART CATHETERIZATION WITH CORONARY ANGIOGRAM;  Surgeon: Minus Breeding, MD;  Location: [facility] CATH LAB;  Service: Cardiovascular;  Laterality: N/A;     Current Outpatient Prescriptions  Medication Sig Dispense Refill  . acetaminophen-codeine (TYLENOL #3) 300-30 MG tablet Take 1 tablet by mouth 2 (two) times daily as needed for moderate pain.   0  . amLODipine  (NORVASC) 10 MG tablet Take 10 mg by mouth daily.    Marland Kitchen aspirin EC 81 MG tablet Take 81 mg by mouth daily.    Marland Kitchen atorvastatin (LIPITOR) 80 MG tablet Take 80 mg by mouth daily.    . Calcium Carbonate (CALCIUM-CARB 600 PO) Take 600 mg by mouth 2 (two) times daily.    . carvedilol (COREG) 25 MG tablet Take 25 mg by mouth 2 (two) times daily with a meal.    . chlorthalidone (HYGROTON) 50 MG tablet Take 50 mg by mouth daily.    Marland Kitchen escitalopram (LEXAPRO) 20 MG tablet Take 20 mg by mouth daily.    . furosemide (LASIX) 20 MG tablet Take 3 tablets (60 mg total) by mouth daily. 90 tablet 6  . gabapentin (NEURONTIN) 100 MG capsule Take 200-300 mg by mouth 2 (two) times daily. 200 mg in the morning and 300 mg at bedtime    . insulin glargine (LANTUS) 100 UNIT/ML injection Inject 20 Units into the skin at bedtime.     Marland Kitchen tiotropium (SPIRIVA) 18 MCG inhalation capsule Place 18 mcg into inhaler and inhale daily.    . valsartan (DIOVAN) 80 MG tablet Take 80 mg by mouth daily.     No current facility-administered medications for this visit.    Allergies:   Codeine and Morphine and related    Social History:  The patient  reports that she quit smoking about 26 years ago. Her smoking use included Cigarettes. She has a 34 pack-year smoking history. She has never used smokeless tobacco. She reports that she does not drink alcohol or use illicit drugs.   Family History:  The patient's family history includes Cancer in her brother, sister, and sister; Heart attack (age of onset: 86) in her father; Heart attack (age of onset: 52) in her mother; Stroke in her father.    ROS:  Please see the history of present illness.   Otherwise, review of systems are positive for none.   All other systems are reviewed and negative.    PHYSICAL EXAM: VS:  BP 158/62 mmHg  Pulse 47  Ht '5\' 1"'$  (1.549 m)  Wt 193 lb 3.2 oz (87.635 kg)  BMI 36.52 kg/m2 , BMI Body mass index is 36.52 kg/(m^2). GENERAL:  Well appearing HEENT:  Pupils  equal round and reactive, fundi not visualized, oral mucosa unremarkable NECK:  No jugular venous distention, waveform within normal limits, carotid upstroke brisk and symmetric, bilateral bruits, no thyromegaly LYMPHATICS:  No cervical, inguinal adenopathy LUNGS:  Clear to auscultation bilaterally BACK:  No CVA tenderness CHEST:  Unremarkable HEART:  PMI not displaced or sustained,S1 and S2 within normal limits, no S3, no S4, no clicks, no rubs, 2 out of 6 apical early peaking systolic murmur radiating slightly out the aortic outflow tract, no diastolic murmurs ABD:  Flat, positive bowel sounds normal in frequency  in pitch, no bruits, no rebound, no guarding, no midline pulsatile mass, no hepatomegaly, no splenomegaly EXT:  2 plus pulses upper and decreased DP/PT, no edema, no cyanosis no clubbing SKIN:  No rashes no nodules NEURO:  Cranial nerves II through XII grossly intact, motor grossly intact throughout PSYCH:  Cognitively intact, oriented to person place and time    EKG:  EKG is ordered today. The ekg ordered today demonstrates sinus bradycardia, rate 47, right bundle branch block, no acute ST-T wave changes.   Recent Labs: 01/31/2015: BUN 28*; Creatinine, Ser 1.42*; Hemoglobin 10.7*; Platelets 187; Potassium 5.8*; Sodium 136    Lipid Panel No results found for: CHOL, TRIG, HDL, CHOLHDL, VLDL, LDLCALC, LDLDIRECT    Wt Readings from Last 3 Encounters:  02/28/15 193 lb 3.2 oz (87.635 kg)  02/21/15 202 lb (91.627 kg)  02/01/15 194 lb (87.998 kg)      Other studies Reviewed: Additional studies/ records that were reviewed today include: Cardiac cath and [facility].. Review of the above records demonstrates:  Please see elsewhere in the note.     ASSESSMENT AND PLAN:  CHRONIC DIASTOLIC HF:  I will be checking a BNP level. I think she is euvolemic. For now she will continue the meds as listed. We talked about salt and fluid restriction and she does when necessary  dosing of her Lasix. I will check a basic metabolic profile as well.  HTN:   Her blood pressures elevated today but this is unusual. No change in therapy is indicated.  CAD:  I do not strongly suspect angina as contributor to her progressive dyspnea but I will consider follow-up perfusion studies in the future.  SLEEP APNEA:  She has not been looked at for quite a while to see if her CPAP is working. I will aspirin another sleep study to evaluate this.  DYSLIPIDEMIA:  This is followed by her PCP. I would be happy to review these numbers.  DM:  She knows that her A1c is well controlled.no change  Current medicines are reviewed at length with the patient today.  The patient does not have concerns regarding medicines.  The following changes have been made:  no change  Labs/ tests ordered today include:   Orders Placed This Encounter  Procedures  . B Nat Peptide  . Basic Metabolic Panel (BMET)  . EKG 12-Lead  . Split night study     Disposition:   FU with me in 2 months    Signed, Minus Breeding, MD  02/28/2015 10:05 AM    [place]

## 2015-03-01 LAB — BASIC METABOLIC PANEL
BUN: 27 mg/dL — ABNORMAL HIGH (ref 7–25)
CHLORIDE: 107 mmol/L (ref 98–110)
CO2: 23 mmol/L (ref 20–31)
Calcium: 9.5 mg/dL (ref 8.6–10.4)
Creat: 1.39 mg/dL — ABNORMAL HIGH (ref 0.60–0.93)
Glucose, Bld: 134 mg/dL — ABNORMAL HIGH (ref 65–99)
POTASSIUM: 4.8 mmol/L (ref 3.5–5.3)
SODIUM: 143 mmol/L (ref 135–146)

## 2015-03-01 LAB — BRAIN NATRIURETIC PEPTIDE: BRAIN NATRIURETIC PEPTIDE: 338.5 pg/mL — AB (ref 0.0–100.0)

## 2015-03-04 ENCOUNTER — Telehealth: Payer: Self-pay | Admitting: *Deleted

## 2015-03-04 DIAGNOSIS — Z79899 Other long term (current) drug therapy: Secondary | ICD-10-CM

## 2015-03-04 NOTE — Telephone Encounter (Signed)
-----   Message from Minus Breeding, MD sent at 03/02/2015  1:16 AM EST ----- BNP is elevated and she is SOB. I would suggest that she take 80 mg of Lasix daily (assuming that her current dose is correct as listed at 60 mg.)  Check a BMET in 7 days.  Call Ms. Nims with the results and send results to Cyndy Freeze, MD

## 2015-03-04 NOTE — Telephone Encounter (Signed)
Spoke with pt and daughter about her blood work and to increase lasix 80 mg daily, pt voice understanding, labs was ordered and pt will be getting it done at [facility] in [place] because that's closer to her, labs was faxed to [facility]

## 2015-03-04 NOTE — Progress Notes (Signed)
Thoracic Location of Tumor / Histology: lung cancer - right lower lobe  Patient presented with an abnormal CT scan in August 2016.  Biopsies revealed:   02/01/15 Diagnosis Lung, needle/core biopsy(ies), right lower lobe - INVASIVE ADENOCARCINOMA, SEE COMMENT  01/31/15 Diagnosis Lung, needle/core biopsy(ies), left upper lobe GLANDULAR EPITHELIAL ATYPIA ASSOCIATED WITH FIBROSIS AND MARKED INFLAMMATION, SEE COMMENT.  Tobacco/Marijuana/Snuff/ETOH use: smoked 1 ppd for 34 years.  Quit in 1990. Denies marijuana, snuff, etoh use.  Past/Anticipated interventions by cardiothoracic surgery, if any: no  Past/Anticipated interventions by medical oncology, if any: referred to [facility], Dr. Hinton Rao  Signs/Symptoms  Weight changes, if any: no  Respiratory complaints, if any: yes - has had a cough - uses cpap at night  Hemoptysis, if any: last week she saw a string of blood after coughing  Pain issues, if any:  Yes - arthritis  SAFETY ISSUES:  Prior radiation? no  Pacemaker/ICD? no   Possible current pregnancy?no  Is the patient on methotrexate? no  Current Complaints / other details:  Patient is here with her daughter.  BP 147/40 mmHg  Pulse 47  Temp(Src) 97.7 F (36.5 C) (Oral)  Resp 18  Ht '5\' 1"'$  (1.549 m)  Wt 192 lb 14.4 oz (87.499 kg)  BMI 36.47 kg/m2  SpO2 98%   Wt Readings from Last 3 Encounters:  03/07/15 192 lb 14.4 oz (87.499 kg)  02/28/15 193 lb 3.2 oz (87.635 kg)  02/21/15 202 lb (91.627 kg)

## 2015-03-05 ENCOUNTER — Telehealth: Payer: Self-pay | Admitting: Cardiology

## 2015-03-05 DIAGNOSIS — Z09 Encounter for follow-up examination after completed treatment for conditions other than malignant neoplasm: Secondary | ICD-10-CM | POA: Diagnosis not present

## 2015-03-05 DIAGNOSIS — E11622 Type 2 diabetes mellitus with other skin ulcer: Secondary | ICD-10-CM | POA: Diagnosis not present

## 2015-03-05 DIAGNOSIS — E119 Type 2 diabetes mellitus without complications: Secondary | ICD-10-CM | POA: Diagnosis not present

## 2015-03-05 DIAGNOSIS — Z872 Personal history of diseases of the skin and subcutaneous tissue: Secondary | ICD-10-CM | POA: Diagnosis not present

## 2015-03-05 DIAGNOSIS — Z8631 Personal history of diabetic foot ulcer: Secondary | ICD-10-CM | POA: Diagnosis not present

## 2015-03-05 NOTE — Telephone Encounter (Signed)
New message      Calling to make sure presc for lasix was sent to express script of a 90 day supply.  Please call tomorrow am

## 2015-03-06 ENCOUNTER — Other Ambulatory Visit: Payer: Self-pay | Admitting: *Deleted

## 2015-03-06 DIAGNOSIS — E119 Type 2 diabetes mellitus without complications: Secondary | ICD-10-CM | POA: Diagnosis not present

## 2015-03-06 DIAGNOSIS — E785 Hyperlipidemia, unspecified: Secondary | ICD-10-CM | POA: Diagnosis not present

## 2015-03-06 DIAGNOSIS — E663 Overweight: Secondary | ICD-10-CM | POA: Diagnosis not present

## 2015-03-06 DIAGNOSIS — E569 Vitamin deficiency, unspecified: Secondary | ICD-10-CM | POA: Diagnosis not present

## 2015-03-06 MED ORDER — FUROSEMIDE 80 MG PO TABS: 80.0000 mg | ORAL_TABLET | Freq: Every day | ORAL | Status: DC

## 2015-03-07 ENCOUNTER — Ambulatory Visit
Admission: RE | Admit: 2015-03-07 | Discharge: 2015-03-07 | Disposition: A | Discharge status: Home / Self Care | Payer: Medicare Other | Source: Ambulatory Visit | Attending: Radiation Oncology | Admitting: Radiation Oncology

## 2015-03-07 ENCOUNTER — Encounter: Payer: Self-pay | Admitting: Radiation Oncology

## 2015-03-07 VITALS — BP 147/40 | HR 47 | Temp 97.7°F | Resp 18 | Ht 61.0 in | Wt 192.9 lb

## 2015-03-07 DIAGNOSIS — I2510 Atherosclerotic heart disease of native coronary artery without angina pectoris: Secondary | ICD-10-CM | POA: Insufficient documentation

## 2015-03-07 DIAGNOSIS — E119 Type 2 diabetes mellitus without complications: Secondary | ICD-10-CM | POA: Insufficient documentation

## 2015-03-07 DIAGNOSIS — Z809 Family history of malignant neoplasm, unspecified: Secondary | ICD-10-CM | POA: Insufficient documentation

## 2015-03-07 DIAGNOSIS — Z87891 Personal history of nicotine dependence: Secondary | ICD-10-CM | POA: Insufficient documentation

## 2015-03-07 DIAGNOSIS — C3431 Malignant neoplasm of lower lobe, right bronchus or lung: Secondary | ICD-10-CM | POA: Insufficient documentation

## 2015-03-07 DIAGNOSIS — I509 Heart failure, unspecified: Secondary | ICD-10-CM | POA: Insufficient documentation

## 2015-03-07 DIAGNOSIS — Z86718 Personal history of other venous thrombosis and embolism: Secondary | ICD-10-CM | POA: Insufficient documentation

## 2015-03-07 DIAGNOSIS — Z510 Encounter for antineoplastic radiation therapy: Secondary | ICD-10-CM | POA: Insufficient documentation

## 2015-03-07 DIAGNOSIS — K219 Gastro-esophageal reflux disease without esophagitis: Secondary | ICD-10-CM | POA: Insufficient documentation

## 2015-03-07 DIAGNOSIS — E530 Riboflavin deficiency: Secondary | ICD-10-CM | POA: Insufficient documentation

## 2015-03-07 DIAGNOSIS — G4733 Obstructive sleep apnea (adult) (pediatric): Secondary | ICD-10-CM | POA: Insufficient documentation

## 2015-03-07 DIAGNOSIS — N289 Disorder of kidney and ureter, unspecified: Secondary | ICD-10-CM | POA: Insufficient documentation

## 2015-03-07 DIAGNOSIS — D509 Iron deficiency anemia, unspecified: Secondary | ICD-10-CM | POA: Insufficient documentation

## 2015-03-07 NOTE — Progress Notes (Signed)
Radiation Oncology         [phone] ________________________________  Initial Outpatient Consultation  Name: Natalie Porter MRN: [id number]  Date: 03/07/2015  DOB: 11-01-1940  NG:EXBMW Hochrein, MD  Derwood Kaplan, MD   REFERRING PHYSICIAN: Derwood Kaplan, MD  DIAGNOSIS: The encounter diagnosis was Primary cancer of right lower lobe of lung ([place]).   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 female who is presenting today because of an abnormal CT scan in August 2016. She has had a few pulmonary nodules, but especially 2 areas which have now changed. CT of the chest in August revealed a lesion of the left apex which had increased from 1.5 cm to 2.2 cm. She also had a right lower lobe nodule which had increased from 1.2 cm to 1.4 cm. There were some borderline enlarged mediastinal and right infrahilar nodes. A repeat CT scan was done on October 12th and revealed these areas to be essentially unchanged. She then had a PET scan performed that day which did show hypermetabolic activity in the left upper lobe lesion with an SUV of 7.2 as well as hypermetabolic activity in the right lower lobe with an SUV of 5.2. However, there was no hypermetabolic activity in the mediastinal activity. She was seen by Dr. Baltazar Apo and had a CT-guided biopsy of the left upper lobe nodule on November 10th which was found to be benign, with glandular and epithelial atypia associated with fibrosis and marked inflammation, but no definite malignant cells. The following day she had a biopsy of the right lower lobe nodule which was confirmed to be an invasive adenocarcinoma and did have a tiny right apical pneumothorax afterwards, which was less than 5%. She has had a cough for the last year which was largely nonproductive and she has had an ENT evaluation of her throat which was negative. She denies any chest  pain or wheezing, but does have shortness of breath with minimal exertion. Her appetite is fine and she denies any weight loss.  PREVIOUS RADIATION THERAPY: No  PAST MEDICAL HISTORY:  has a past medical history of Diabetes mellitus without complication ([place]); Hypertension; Renal disorder; Gallstone; Arthritis; DVT (deep venous thrombosis) ([facility]); COPD (chronic obstructive pulmonary disease) ([place]); Pneumonia (2014); Neuropathy ([place]); GERD (gastroesophageal reflux disease); Anemia; Hypercholesteremia; CHF (congestive heart failure) ([place]); Iron deficiency anemia; Vitamin B12 deficiency; PVD (peripheral vascular disease) ([place]); Osteoarthritis; CAD (coronary artery disease); Lung cancer ([place]); and Sleep apnea.    PAST SURGICAL HISTORY: Past Surgical History  Procedure Laterality Date  . Tubal ligation    . Carpal tunnel release Bilateral   . Eye surgery Bilateral     cataract  . Foot surgery Bilateral     bone spurs, and repair  . Carotid endarterectomy Right 1990  . Breast biopsy Left   . Lung biopsy      non cancerous  . Colonoscopy w/ polypectomy    . Femoral-femoral bypass graft  08/07/2013    Procedure: BYPASS GRAFT FEMORAL-FEMORAL ARTERY USING 8MM X 30CM HEMASHIELD GRAFT; LEFT ILIAC ANGIOGRAM; ATTEMPTED LEFT ILIAC ARTERY STENT GRAFT;  Surgeon: Elam Dutch, MD;  Location: [place];  Service: Vascular;;  . Application of wound vac Bilateral 08/07/2013    Procedure: APPLICATION OF INCISIONAL WOUND [place];  Surgeon: Elam Dutch, MD;  Location: [place];  Service: Vascular;  Laterality: Bilateral;  . Endarterectomy femoral Left 08/07/2013    Procedure:  ENDARTERECTOMY FEMORAL;  Surgeon: Elam Dutch, MD;  Location: [place];  Service: Vascular;  Laterality: Left;  . Abdominal aortagram N/A 07/25/2013    Procedure: ABDOMINAL Maxcine Ham;  Surgeon: Serafina Mitchell, MD;  Location: [facility] CATH LAB;  Service: Cardiovascular;  Laterality: N/A;  . Left heart catheterization with coronary angiogram N/A 07/27/2013     Procedure: LEFT HEART CATHETERIZATION WITH CORONARY ANGIOGRAM;  Surgeon: Minus Breeding, MD;  Location: [facility] CATH LAB;  Service: Cardiovascular;  Laterality: N/A;    FAMILY HISTORY: family history includes Cancer in her sister; Cancer (age of onset: 85) in her sister; Heart attack (age of onset: 75) in her father; Heart attack (age of onset: 57) in her mother; Stroke in her father.  SOCIAL HISTORY:  reports that she quit smoking about 26 years ago. Her smoking use included Cigarettes. She has a 34 pack-year smoking history. She has never used smokeless tobacco. She reports that she does not drink alcohol or use illicit drugs.   She worked in a Equities trader for two years.  ALLERGIES: Codeine and Morphine and related  MEDICATIONS:  Current Outpatient Prescriptions  Medication Sig Dispense Refill  . acetaminophen-codeine (TYLENOL #3) 300-30 MG tablet Take 1 tablet by mouth 2 (two) times daily as needed for moderate pain.   0  . amLODipine (NORVASC) 10 MG tablet Take 10 mg by mouth daily.    Marland Kitchen aspirin EC 81 MG tablet Take 81 mg by mouth daily.    Marland Kitchen atorvastatin (LIPITOR) 80 MG tablet Take 80 mg by mouth daily.    . Calcium Carbonate (CALCIUM-CARB 600 PO) Take 600 mg by mouth 2 (two) times daily.    . carvedilol (COREG) 25 MG tablet Take 25 mg by mouth 2 (two) times daily with a meal.    . chlorthalidone (HYGROTON) 50 MG tablet Take 50 mg by mouth daily.    Marland Kitchen escitalopram (LEXAPRO) 20 MG tablet Take 20 mg by mouth daily.    . furosemide (LASIX) 80 MG tablet Take 1 tablet (80 mg total) by mouth daily. 90 tablet 1  . gabapentin (NEURONTIN) 100 MG capsule Take 200-300 mg by mouth 2 (two) times daily. 200 mg in the morning and 300 mg at bedtime    . insulin glargine (LANTUS) 100 UNIT/ML injection Inject 20 Units into the skin at bedtime.     Marland Kitchen tiotropium (SPIRIVA) 18 MCG inhalation capsule Place 18 mcg into inhaler and inhale daily.    . valsartan (DIOVAN) 80 MG tablet Take 80 mg by mouth daily.       No current facility-administered medications for this encounter.    REVIEW OF SYSTEMS:  A 15 point review of systems is documented in the electronic medical record. This was obtained by the nursing staff. However, I reviewed this with the patient to discuss relevant findings and make appropriate changes.     PHYSICAL EXAM:  height is '5\' 1"'$  (1.549 m) and weight is 192 lb 14.4 oz (87.499 kg). Her oral temperature is 97.7 F (36.5 C). Her blood pressure is 147/40 and her pulse is 47. Her respiration is 18 and oxygen saturation is 98%.   BP 147/40 mmHg  Pulse 47  Temp(Src) 97.7 F (36.5 C) (Oral)  Resp 18  Ht '5\' 1"'$  (1.549 m)  Wt 192 lb 14.4 oz (87.499 kg)  BMI 36.47 kg/m2  SpO2 98%  General Appearance:    Alert, cooperative, no distress, appears stated age, somewhat hard of hearing   Head:  Normocephalic, without obvious abnormality, atraumatic  Eyes:    PERRL, conjunctiva/corneas clear, EOM's intact,         Nose:   Nares normal, septum midline, mucosa normal, no drainage    or sinus tenderness  Throat:   Lips, mucosa, and tongue normal;dentures in place  Neck:   Supple, symmetrical, trachea midline, no adenopathy;    thyroid:  no enlargement/tenderness/nodules; no carotid   bruit or JVD  Back:     Symmetric, no curvature, ROM normal, no CVA tenderness  Lungs:     Clear to auscultation bilaterally, respirations unlabored  Chest Wall:    No tenderness or deformity   Heart:    Regular rate and rhythm,        Abdomen:     Soft, non-tender, bowel sounds active all four quadrants,    no masses, no organomegaly        Extremities:   Extremities normal, atraumatic, no cyanosis or edema  Pulses:   2+ and symmetric all extremities  Skin:   Skin color, texture, turgor normal, no rashes or lesions  Lymph nodes:   Cervical, supraclavicular, and axillary nodes normal  Neurologic:    normal strength, sensation and reflexes    throughout     ECOG = 1  LABORATORY DATA:  Lab Results   Component Value Date   WBC 7.5 01/31/2015   HGB 10.7* 01/31/2015   HCT 34.4* 01/31/2015   MCV 96.4 01/31/2015   PLT 187 01/31/2015   NEUTROABS 4.4 01/31/2015   Lab Results  Component Value Date   NA 143 02/28/2015   K 4.8 02/28/2015   CL 107 02/28/2015   CO2 23 02/28/2015   GLUCOSE 134* 02/28/2015   CREATININE 1.39* 02/28/2015   CALCIUM 9.5 02/28/2015      RADIOGRAPHY: Ct Angio Ao+bifem W/cm &/or Wo/cm  02/21/2015  CLINICAL DATA:  Right femoral bypass graft.  Right buttock pain. EXAM: CT ANGIOGRAPHY OF ABDOMINAL AORTA WITH ILIOFEMORAL RUNOFF TECHNIQUE: Multidetector CT imaging of the abdomen, pelvis and lower extremities was performed using the standard protocol during bolus administration of intravenous contrast. Multiplanar CT image reconstructions and MIPs were obtained to evaluate the vascular anatomy. CONTRAST:  100 cc Isovue 370 COMPARISON:  None. FINDINGS: There is diffuse atherosclerotic calcification and some irregular plaque throughout the length of the aorta beginning in the lower thorax to the bifurcation. There is no significant stenosis or aneurysmal dilatation. 70% narrowing at the origin of the celiac axis. Branch vessels are patent. Atherosclerotic calcification at the origin of the SMA without significant narrowing. Single right renal artery and 2 left renal arteries are patent. 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 It reconstitutes distally. Branch vessels are diminutive. A stent extends from the aorta into the right common iliac artery. The stent and common iliac artery are patent. Right external and internal iliac arteries are patent. 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 Left external and internal iliac artery branches reconstitute. There is a right to left femoral to femoral artery bypass graft which is widely patent. Right common femoral, profunda femoral, and superficial femoral artery are patent. Popliteal and tibial arteries are patent for 3 vessel runoff. Left common femoral,  profunda femoral, and superficial femoral artery are patent. Lip up to 2082 will lower ease are patent for 3 vessel runoff. 14 mm right lower lobe lung nodule is stable. Small right pleural effusion has developed. Cholelithiasis Calcified granulomata in the liver. Calcified granulomata in the spleen Pancreas, adrenal glands, and kidneys are within normal limits.  There is no free-fluid Bladder, uterus, and adnexa are within normal limits. No vertebral compression deformity. Advanced L5-S1 and L4-5 facet arthropathy Review of the MIP images confirms the above findings. 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 A right to left femoral to femoral artery bypass graft is patent. Stented right common iliac artery is patent. Three vessel runoff bilaterally in the lower extremities. IMA is occluded. Significant narrowing at the origin of the celiac axis. SMA is patent. Cholelithiasis. Electronically Signed   By: Marybelle Killings M.D.   On: 02/21/2015 14:13      IMPRESSION: Natalie Porter is a 74 y.o. female with invasive adenocarcinoma of the right lower lung, clinical Stage I.  We discussed radiation treatment and that she is a good candidate for Stereotactic Body Radiotherapy. We discussed the side effects of this treatment and potential long-term toxicities. We also discussed in detail the biopsy results on her left lung. We discussed options for management of left lung including stereotactic radiosurgery or following closely. At This time the patient wishes to follow her left lung lesion and consider treatment if this enlarges on next CT scan imaging sequence.  PLAN:  She will be scheduled for a planning session on Tuesday at 2 pm and start treatment in January. We discussed the consent form.    ------------------------------------------------  Blair Promise, PhD, MD     This document serves as a record of services personally performed by Gery Pray, MD. It was created on his behalf by  Lendon Collar, a trained medical scribe. The creation of this record is based on the scribe's personal observations and the provider's statements to them. This document has been checked and approved by the attending provider.

## 2015-03-11 ENCOUNTER — Ambulatory Visit: Payer: Medicare Other | Admitting: Cardiology

## 2015-03-11 NOTE — Telephone Encounter (Signed)
°  New Prob   Daughter states pt will be in town tomorrow and calling to see if pt can have labs drawn here. She states pt was supposed to have them drawn in [place], however, pt did not want to leave the house this afternoon. Please call.

## 2015-03-11 NOTE — Telephone Encounter (Signed)
Question addressed, meds have been refilled and labwork ordered for [facility].

## 2015-03-12 ENCOUNTER — Ambulatory Visit: Payer: Medicare Other | Admitting: Radiation Oncology

## 2015-03-12 ENCOUNTER — Ambulatory Visit
Admission: RE | Admit: 2015-03-12 | Discharge: 2015-03-12 | Disposition: A | Discharge status: Home / Self Care | Payer: Medicare Other | Source: Ambulatory Visit | Attending: Radiation Oncology | Admitting: Radiation Oncology

## 2015-03-12 DIAGNOSIS — I509 Heart failure, unspecified: Secondary | ICD-10-CM | POA: Diagnosis not present

## 2015-03-12 DIAGNOSIS — Z510 Encounter for antineoplastic radiation therapy: Secondary | ICD-10-CM | POA: Diagnosis not present

## 2015-03-12 DIAGNOSIS — E119 Type 2 diabetes mellitus without complications: Secondary | ICD-10-CM | POA: Diagnosis not present

## 2015-03-12 DIAGNOSIS — C3431 Malignant neoplasm of lower lobe, right bronchus or lung: Secondary | ICD-10-CM | POA: Diagnosis not present

## 2015-03-12 DIAGNOSIS — Z87891 Personal history of nicotine dependence: Secondary | ICD-10-CM | POA: Diagnosis not present

## 2015-03-12 DIAGNOSIS — N289 Disorder of kidney and ureter, unspecified: Secondary | ICD-10-CM | POA: Diagnosis not present

## 2015-03-12 DIAGNOSIS — C3430 Malignant neoplasm of lower lobe, unspecified bronchus or lung: Secondary | ICD-10-CM | POA: Diagnosis not present

## 2015-03-12 DIAGNOSIS — I2510 Atherosclerotic heart disease of native coronary artery without angina pectoris: Secondary | ICD-10-CM | POA: Diagnosis not present

## 2015-03-12 DIAGNOSIS — K219 Gastro-esophageal reflux disease without esophagitis: Secondary | ICD-10-CM | POA: Diagnosis not present

## 2015-03-12 DIAGNOSIS — Z86718 Personal history of other venous thrombosis and embolism: Secondary | ICD-10-CM | POA: Diagnosis not present

## 2015-03-12 DIAGNOSIS — G4733 Obstructive sleep apnea (adult) (pediatric): Secondary | ICD-10-CM | POA: Diagnosis not present

## 2015-03-12 DIAGNOSIS — D509 Iron deficiency anemia, unspecified: Secondary | ICD-10-CM | POA: Diagnosis not present

## 2015-03-12 DIAGNOSIS — Z809 Family history of malignant neoplasm, unspecified: Secondary | ICD-10-CM | POA: Diagnosis not present

## 2015-03-12 DIAGNOSIS — Z79899 Other long term (current) drug therapy: Secondary | ICD-10-CM | POA: Diagnosis not present

## 2015-03-12 DIAGNOSIS — E530 Riboflavin deficiency: Secondary | ICD-10-CM | POA: Diagnosis not present

## 2015-03-13 LAB — BASIC METABOLIC PANEL
BUN: 39 mg/dL — AB (ref 7–25)
CALCIUM: 8.9 mg/dL (ref 8.6–10.4)
CO2: 23 mmol/L (ref 20–31)
CREATININE: 1.41 mg/dL — AB (ref 0.60–0.93)
Chloride: 105 mmol/L (ref 98–110)
GLUCOSE: 204 mg/dL — AB (ref 65–99)
Potassium: 5.3 mmol/L (ref 3.5–5.3)
Sodium: 135 mmol/L (ref 135–146)

## 2015-03-14 ENCOUNTER — Telehealth: Payer: Self-pay | Admitting: Cardiology

## 2015-03-14 DIAGNOSIS — Z510 Encounter for antineoplastic radiation therapy: Secondary | ICD-10-CM | POA: Diagnosis not present

## 2015-03-14 DIAGNOSIS — C3430 Malignant neoplasm of lower lobe, unspecified bronchus or lung: Secondary | ICD-10-CM | POA: Diagnosis not present

## 2015-03-14 NOTE — Progress Notes (Signed)
  Radiation Oncology         [phone] ________________________________  Name: Maven Rosander MRN: [id number]  Date: 03/12/2015  DOB: 03/11/1941  SIMULATION AND TREATMENT PLANNING NOTE    ICD-9-CM ICD-10-CM   1. 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  Identity was confirmed.  All relevant records and images related to the planned course of therapy were reviewed.  The patient freely provided informed written consent to proceed with treatment after reviewing the details related to the planned course of therapy. The consent form was witnessed and verified by the simulation staff.  Then, the patient was set-up in a stable reproducible  supine position for radiation therapy.  CT images were obtained.  Surface markings were placed.  The CT images were loaded into the planning software.  Then the target and avoidance structures were contoured.  Treatment planning then occurred.  The radiation prescription was entered and confirmed.  Then, I designed and supervised the construction of a total of 2 medically necessary complex treatment devices.  I have requested : This treatment constitutes a Special Treatment Procedure for the following reason: [ High dose per fraction requiring special monitoring for increased toxicities of treatment including daily imaging..  I have ordered:dose calc.  PLAN:  The patient will receive 54 Gy in 3 fractions using SBRT techniques.  ________________________________  -----------------------------------  Blair Promise, PhD, MD

## 2015-03-14 NOTE — Telephone Encounter (Signed)
She does not remember what she was told to do about her fluid medicine.Please call Natalie Porter to give the instructions.

## 2015-03-14 NOTE — Telephone Encounter (Signed)
Returned call to patient's daughter Horris Latino she stated someone called her earlier today and she does not remember.Advised Bmet stable continue current medications.

## 2015-03-24 ENCOUNTER — Ambulatory Visit (HOSPITAL_BASED_OUTPATIENT_CLINIC_OR_DEPARTMENT_OTHER): Payer: Medicare Other | Attending: Cardiology | Admitting: *Deleted

## 2015-03-24 VITALS — Ht 62.0 in | Wt 189.0 lb

## 2015-03-24 DIAGNOSIS — G4733 Obstructive sleep apnea (adult) (pediatric): Secondary | ICD-10-CM

## 2015-03-24 DIAGNOSIS — G4730 Sleep apnea, unspecified: Secondary | ICD-10-CM

## 2015-03-24 DIAGNOSIS — G4710 Hypersomnia, unspecified: Secondary | ICD-10-CM | POA: Diagnosis not present

## 2015-03-24 DIAGNOSIS — Z79899 Other long term (current) drug therapy: Secondary | ICD-10-CM | POA: Diagnosis not present

## 2015-03-24 DIAGNOSIS — R5383 Other fatigue: Secondary | ICD-10-CM | POA: Insufficient documentation

## 2015-03-24 DIAGNOSIS — Z7982 Long term (current) use of aspirin: Secondary | ICD-10-CM | POA: Insufficient documentation

## 2015-03-26 ENCOUNTER — Ambulatory Visit
Admission: RE | Admit: 2015-03-26 | Discharge: 2015-03-26 | Disposition: A | Discharge status: Home / Self Care | Payer: Medicare Other | Source: Ambulatory Visit | Attending: Radiation Oncology | Admitting: Radiation Oncology

## 2015-03-26 ENCOUNTER — Encounter: Payer: Self-pay | Admitting: Radiation Oncology

## 2015-03-26 VITALS — BP 150/38 | HR 53 | Temp 97.4°F | Ht 62.0 in | Wt 190.7 lb

## 2015-03-26 DIAGNOSIS — Z809 Family history of malignant neoplasm, unspecified: Secondary | ICD-10-CM | POA: Diagnosis not present

## 2015-03-26 DIAGNOSIS — C3431 Malignant neoplasm of lower lobe, right bronchus or lung: Secondary | ICD-10-CM

## 2015-03-26 DIAGNOSIS — Z87891 Personal history of nicotine dependence: Secondary | ICD-10-CM | POA: Diagnosis not present

## 2015-03-26 DIAGNOSIS — I509 Heart failure, unspecified: Secondary | ICD-10-CM | POA: Diagnosis not present

## 2015-03-26 DIAGNOSIS — Z86718 Personal history of other venous thrombosis and embolism: Secondary | ICD-10-CM | POA: Diagnosis not present

## 2015-03-26 DIAGNOSIS — I2510 Atherosclerotic heart disease of native coronary artery without angina pectoris: Secondary | ICD-10-CM | POA: Diagnosis not present

## 2015-03-26 DIAGNOSIS — E530 Riboflavin deficiency: Secondary | ICD-10-CM | POA: Diagnosis not present

## 2015-03-26 DIAGNOSIS — E119 Type 2 diabetes mellitus without complications: Secondary | ICD-10-CM | POA: Diagnosis not present

## 2015-03-26 DIAGNOSIS — G4733 Obstructive sleep apnea (adult) (pediatric): Secondary | ICD-10-CM | POA: Diagnosis not present

## 2015-03-26 DIAGNOSIS — K219 Gastro-esophageal reflux disease without esophagitis: Secondary | ICD-10-CM | POA: Diagnosis not present

## 2015-03-26 DIAGNOSIS — N289 Disorder of kidney and ureter, unspecified: Secondary | ICD-10-CM | POA: Diagnosis not present

## 2015-03-26 DIAGNOSIS — C3430 Malignant neoplasm of lower lobe, unspecified bronchus or lung: Secondary | ICD-10-CM | POA: Diagnosis not present

## 2015-03-26 DIAGNOSIS — D509 Iron deficiency anemia, unspecified: Secondary | ICD-10-CM | POA: Diagnosis not present

## 2015-03-26 DIAGNOSIS — Z510 Encounter for antineoplastic radiation therapy: Secondary | ICD-10-CM | POA: Diagnosis not present

## 2015-03-26 NOTE — Progress Notes (Signed)
Natalie Porter is here for her 1st SBRT treatment. She denies pain. She does report sleeping well, and uses a CPAP at night. She also denies any shortness of breath, nausea, or any other difficulties. She report she is eating well. She has no other complaints at this time.   BP 150/38 mmHg  Pulse 53  Temp(Src) 97.4 F (36.3 C)  Ht '5\' 2"'$  (1.575 m)  Wt 190 lb 11.2 oz (86.501 kg)  BMI 34.87 kg/m2   Wt Readings from Last 3 Encounters:  03/26/15 190 lb 11.2 oz (86.501 kg)  03/25/15 189 lb (85.73 kg)  03/07/15 192 lb 14.4 oz (87.499 kg)

## 2015-03-26 NOTE — Progress Notes (Signed)
  Radiation Oncology         [phone] ________________________________  Name: Natalie Porter MRN: [id number]  Date: 03/26/2015  DOB: 1941/01/20  Weekly Radiation Therapy Management    ICD-9-CM ICD-10-CM   1. Primary cancer of right lower lobe of lung (HCC) 162.5 C34.31      Current Dose: 18 Gy     Planned Dose:  54 Gy  Narrative . . . . . . . . The patient presents for routine under treatment assessment.                                   The patient is without complaint. No cough or breathing problems                                 Set-up films were reviewed.                                 The chart was checked. Physical Findings. . .    Weight essentially stable.  The lungs are clear. The heart has a regular rhythm and rate. Impression . . . . . . . The patient is tolerating radiation. Plan . . . . . . . . . . . . Continue treatment as planned.  ________________________________   Blair Promise, PhD, MD

## 2015-03-26 NOTE — Progress Notes (Signed)
  Radiation Oncology         [phone] ________________________________  Name: Natalie Porter MRN: [id number]  Date: 03/26/2015  DOB: 1940-12-21  Stereotactic Body Radiotherapy Treatment Procedure Note  18 Gy of planned 54 Gy  NARRATIVE:  Natalie Porter was brought to the stereotactic radiation treatment machine and placed supine on the CT couch. The patient was set up for stereotactic body radiotherapy on the body fix pillow.  3D TREATMENT PLANNING AND DOSIMETRY:  The patient's radiation plan was reviewed and approved prior to starting treatment.  It showed 3-dimensional radiation distributions overlaid onto the planning CT.  The [facility] for the target structures as well as the organs at risk were reviewed. The documentation of this is filed in the radiation oncology EMR.  SIMULATION VERIFICATION:  The patient underwent CT imaging on the treatment unit.  These were carefully aligned to document that the ablative radiation dose would cover the target volume and maximally spare the nearby organs at risk according to the planned distribution.  SPECIAL TREATMENT PROCEDURE: Natalie Porter received high dose ablative stereotactic body radiotherapy to the planned target volume without unforeseen complications. Treatment was delivered uneventfully. The high doses associated with stereotactic body radiotherapy and the significant potential risks require careful treatment set up and patient monitoring constituting a special treatment procedure   STEREOTACTIC TREATMENT MANAGEMENT:  Following delivery, the patient was evaluated clinically. The patient tolerated treatment without significant acute effects, and was discharged to home in stable condition.    PLAN: Continue treatment as planned.  ________________________________  Blair Promise, PhD, MD

## 2015-03-28 ENCOUNTER — Ambulatory Visit
Admission: RE | Admit: 2015-03-28 | Discharge: 2015-03-28 | Disposition: A | Discharge status: Home / Self Care | Payer: Medicare Other | Source: Ambulatory Visit | Attending: Radiation Oncology | Admitting: Radiation Oncology

## 2015-03-28 DIAGNOSIS — Z510 Encounter for antineoplastic radiation therapy: Secondary | ICD-10-CM | POA: Diagnosis not present

## 2015-04-01 NOTE — Sleep Study (Signed)
NAME: Natalie Porter  Patient Name: Natalie Porter, [place] Date: 03/24/2015 Gender: Female D.O.B: May 25, 1940 Age (years): 74 Referring Provider: Minus Breeding Height (inches): 62 Interpreting Physician: Shelva Majestic MD, ABSM Weight (lbs): 189 RPSGT: Gerhard Perches BMI: 35 MRN: [id number] Neck Size: 16.50  CLINICAL INFORMATION The patient is referred for a Split Night Study for re-evaluation of OSA. She has been on CPAP therapy for 8-10 years. She complains of significant nonpositional snoring, fatigue daytime sleepiness.   SLEEP STUDY TECHNIQUE As per the AASM Manual for the Scoring of Sleep and Associated Events v2.3 (April 2016) with a hypopnea requiring 4% desaturations. The channels recorded and monitored were frontal, central and occipital EEG, electrooculogram (EOG), submentalis EMG (chin), nasal and oral airflow, thoracic and abdominal wall motion, anterior tibialis EMG, snore microphone, electrocardiogram, and pulse oximetry. Continuous positive airway pressure (CPAP) was initiated at the beginning of the study and titrated to treat sleep-disordered breathing.  MEDICATIONS  acetaminophen-codeine (TYLENOL #3) 300-30 MG tablet 1 tablet, 2 times daily PRN     amLODipine (NORVASC) 10 MG tablet 10 mg, Daily     aspirin EC 81 MG tablet 81 mg, Daily     atorvastatin (LIPITOR) 80 MG tablet 80 mg, Daily     Calcium Carbonate (CALCIUM-CARB 600 PO) 600 mg, 2 times daily     carvedilol (COREG) 25 MG tablet 25 mg, 2 times daily with meals     chlorthalidone (HYGROTON) 50 MG tablet 50 mg, Daily     escitalopram (LEXAPRO) 20 MG tablet 20 mg, Daily     furosemide (LASIX) 80 MG tablet 80 mg, Daily     gabapentin (NEURONTIN) 100 MG capsule 200-300 mg, 2 times daily     insulin glargine (LANTUS) 100 UNIT/ML injection 20 Units, Daily at bedtime     tiotropium (SPIRIVA) 18 MCG inhalation capsule 18 mcg, Daily     valsartan (DIOVAN) 80 MG tablet 80 mg, Daily   Medications  administered by patient during sleep study : No sleep medicine administered.  TECHNICIAN COMMENTS Comments added by technician: Patient talked in his/her sleep.  Comments added by scorer: N/A  RESPIRATORY PARAMETERS Optimal PAP Pressure (cm): 11 AHI at Optimal Pressure (/hr): 0.0 Overall Minimal O2 (%): 85.00 Supine % at Optimal Pressure (%): 0 Minimal O2 at Optimal Pressure (%): 91.0    SLEEP ARCHITECTURE The study was initiated at 10:15:03 PM and ended at 4:50:03 AM.  On the diagnostic study sleep onset time was 14.8 minutes and the sleep efficiency was 80.9%%. The total sleep time was 164.9 minutes. Wake after sleep onset was 24 minutes.  The patient spent 15.8%% of the night in stage N1 sleep, 65.4% in stage N2 sleep, 0.00% in stage N3 and  18.8% in REM. Stage REM latency was 109.0 minutes  Alpha intrusion was absent. Supine sleep was 39.17%.  On the therapeutic study, sleep efficiency was 76.2% Total sleep time was 142.7 minutes. Wake after sleep onset was 27/5 minutes.  The patient spent 4.9% in stage N1 sleep, 66.4% in stage N2 sleep. 0.00% in N3  and 28.7% in REM sleep.  REM latency was 60 minutes.  CARDIAC DATA The 2 lead EKG demonstrated sinus rhythm. The mean heart rate was 61.77 beats per minute. Other EKG findings include: PVCs.  LEG MOVEMENT DATA The total Periodic Limb Movements of Sleep (PLMS) were 4. The PLMS index was 0.78. A PLMS index of <15 is considered normal in adults.  IMPRESSIONS - Moderate obstructive sleep apnea syndrome with an  AHI of 26.2/hr overall, but severe during REM sleep (AHI 34.8/hr).  The optimal PAP pressure was 11 cm of water. - Central sleep apnea was not noted during this titration (CAI = 0.4/h). - Reduced sleep efficiency. - Moderate oxygen desaturations to a nadir of 85.00%. - The patient snored with Soft snoring volume. - The arousal index on the baseline study was severly abnormal - 2-lead EKG demonstrated: PVCs - Clinically  significant periodic limb movements were not noted during this study. Arousals associated with PLMs were rare.  DIAGNOSIS - Obstructive Sleep Apnea (327.23 [G47.33 ICD-10])  RECOMMENDATIONS - Recommend an initial trial of CPAP therapy with EPR of 3 at 11 cm H2O with and heated humidification. A Resmed Nasal pillow medium size mask was used for the titration. - Avoid alcohol, sedatives and other CNS depressants that may worsen sleep apnea and disrupt normal sleep architecture. - Sleep hygiene should be reviewed to assess factors that may improve sleep quality. - Weight management and regular exercise should be initiated or continued. - Recommend a download be obtained in 30 days and sleep clinic evaluation.   Troy Sine, MD, [place], American Board of Sleep Medicine  ELECTRONICALLY SIGNED ON:  04/01/2015, 7:10 PM [place] PH: [phone]   FX: [phone] [place]

## 2015-04-02 ENCOUNTER — Ambulatory Visit
Admission: RE | Admit: 2015-04-02 | Discharge: 2015-04-02 | Disposition: A | Discharge status: Home / Self Care | Payer: Medicare Other | Source: Ambulatory Visit | Attending: Radiation Oncology | Admitting: Radiation Oncology

## 2015-04-02 ENCOUNTER — Encounter: Payer: Self-pay | Admitting: Radiation Oncology

## 2015-04-02 VITALS — BP 143/36 | HR 50 | Temp 97.6°F | Resp 16 | Ht 62.0 in | Wt 194.2 lb

## 2015-04-02 DIAGNOSIS — C3431 Malignant neoplasm of lower lobe, right bronchus or lung: Secondary | ICD-10-CM

## 2015-04-02 DIAGNOSIS — Z510 Encounter for antineoplastic radiation therapy: Secondary | ICD-10-CM | POA: Diagnosis not present

## 2015-04-02 NOTE — Progress Notes (Signed)
  Radiation Oncology         [phone] ________________________________  Name: Aarionna Germer MRN: [id number]  Date: 04/02/2015  DOB: [birth year]  Stereotactic Body Radiotherapy Treatment Procedure Note  NARRATIVE:  Hellon Vaccarella was brought to the stereotactic radiation treatment machine and placed supine on the CT couch. The patient was set up for stereotactic body radiotherapy on the body fix pillow.  3D TREATMENT PLANNING AND DOSIMETRY:  The patient's radiation plan was reviewed and approved prior to starting treatment.  It showed 3-dimensional radiation distributions overlaid onto the planning CT.  The [facility] for the target structures as well as the organs at risk were reviewed. The documentation of this is filed in the radiation oncology EMR.  SIMULATION VERIFICATION:  The patient underwent CT imaging on the treatment unit.  These were carefully aligned to document that the ablative radiation dose would cover the target volume and maximally spare the nearby organs at risk according to the planned distribution.  SPECIAL TREATMENT PROCEDURE: [name] received high dose ablative stereotactic body radiotherapy to the planned target volume without unforeseen complications. Treatment was delivered uneventfully. The high doses associated with stereotactic body radiotherapy and the significant potential risks require careful treatment set up and patient monitoring constituting a special treatment procedure   STEREOTACTIC TREATMENT MANAGEMENT:  Following delivery, the patient was evaluated clinically. The patient tolerated treatment without significant acute effects, and was discharged to home in stable condition.    PLAN: Continue treatment as planned.  ________________________________  Blair Promise, PhD, MD

## 2015-04-02 NOTE — Progress Notes (Signed)
  Radiation Oncology         [phone] ________________________________  Name: Natalie Porter MRN: [id number]  Date: 04/02/2015  DOB: [birth year]  Weekly Radiation Therapy Management  Current Dose: 54 Gy     Planned Dose:  54 Gy  Narrative . . . . . . . . The patient presents for routine under treatment assessment.                                   The patient is without complaint. Happy to complete her radiation therapy                                 Set-up films were reviewed.                                 The chart was checked. Physical Findings. . .  height is '5\' 2"'$  (1.575 m) and weight is 194 lb 3.2 oz (88.089 kg). Her oral temperature is 97.6 F (36.4 C). Her blood pressure is 143/36 and her pulse is 50. Her respiration is 16 and oxygen saturation is 100%. . The lungs are clear. The heart has a regular rhythm and rate Impression . . . . . . . The patient is tolerating radiation. Plan . . . . . . . . . . . . Routine follow-up in one month  ________________________________   Blair Promise, PhD, MD

## 2015-04-02 NOTE — Progress Notes (Signed)
Natalie Porter has completed 3 fractions to her right lung.  She denies having pain.  She continues to have a dry cough with a rattle in her throat.  She denies feeling short of breathShe has been given a one month follow up appointment.    BP 143/36 mmHg  Pulse 50  Temp(Src) 97.6 F (36.4 C) (Oral)  Resp 16  Ht '5\' 2"'$  (1.575 m)  Wt 194 lb 3.2 oz (88.089 kg)  BMI 35.51 kg/m2  SpO2 100%

## 2015-04-02 NOTE — Progress Notes (Signed)
  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   No appreciable side effects during the course of treatment  Plan: The patient has completed radiation treatment. The patient will return to radiation oncology clinic for routine followup in one month. I advised them to call or return sooner if they have any questions or concerns related to their recovery or treatment.  -----------------------------------  Blair Promise, PhD, MD

## 2015-04-03 ENCOUNTER — Telehealth: Payer: Self-pay | Admitting: *Deleted

## 2015-04-03 NOTE — Telephone Encounter (Signed)
Left message to return a call to discuss sleep study.

## 2015-04-03 NOTE — Telephone Encounter (Signed)
-----   Message from Troy Sine, MD sent at 04/01/2015  7:35 PM EST ----- Natalie Porter please set up with the patient's DME for CPAP therapy and f/u sleep clinic.

## 2015-04-08 DIAGNOSIS — I2510 Atherosclerotic heart disease of native coronary artery without angina pectoris: Secondary | ICD-10-CM | POA: Diagnosis not present

## 2015-04-08 DIAGNOSIS — I110 Hypertensive heart disease with heart failure: Secondary | ICD-10-CM | POA: Diagnosis not present

## 2015-04-08 DIAGNOSIS — E1121 Type 2 diabetes mellitus with diabetic nephropathy: Secondary | ICD-10-CM | POA: Diagnosis not present

## 2015-04-08 DIAGNOSIS — I5022 Chronic systolic (congestive) heart failure: Secondary | ICD-10-CM | POA: Diagnosis not present

## 2015-04-10 ENCOUNTER — Telehealth: Payer: Self-pay | Admitting: Cardiovascular Disease

## 2015-04-10 NOTE — Telephone Encounter (Signed)
Daughter wants you to call when you arrange everything about her C-Pap.Pt is hard of hearing.

## 2015-04-16 NOTE — Progress Notes (Signed)
Referred to [facility] for CPAP setup.

## 2015-04-16 NOTE — Telephone Encounter (Signed)
Called and notified patient daughter patient has been referred to [facility] for CPAP set up. They will call her once insurance benefits has been verified.

## 2015-04-16 NOTE — Telephone Encounter (Signed)
Called daughter. Left message to return a call to me.

## 2015-05-07 NOTE — Progress Notes (Signed)
Cardiology Office Note   Date:  05/08/2015   ID:  Natalie Porter, DOB 1941/03/12, MRN [id number]  PCP:  Minus Breeding, MD  Cardiologist:   Minus Breeding, MD   No chief complaint on file.     History of Present Illness: Natalie Porter is a 75 y.o. female who presents for follow-up of known coronary disease. She had previously established with another cardiology group.  I had seen her in the past but she had not been following with [place].  I saw her recently for the first time in several years.   I had done a catheterization on her only.. She has peripheral vascular disease and did have an abnormal stress test in 2015 year with some anterior ischemia.  She has a cardiac catheterization with disease as described below. This was thought to be nonobstructive and managed medically. Echocardiography demonstrated well-preserved ejection fraction with an EF of 65%. She still has had heart failure with this preserved ejection fraction.   At the last visit she did have some dyspnea and I increased her diuretic. She's done much better with this. She's had follow-up labs that demonstrated stable creatinine and potassium. She is breathing better. She has less swelling. Her blood pressure is better controlled. She's wearing CPAP having had a sleep study and follow up. She's sleeping better has no allergies.  Past Medical History  Diagnosis Date  . Diabetes mellitus without complication ([place])   . Hypertension   . Renal disorder     stage 3  . Gallstone   . Arthritis   . DVT (deep venous thrombosis) ([place])     2011  . COPD (chronic obstructive pulmonary disease) ([place])   . Pneumonia 2014  . Neuropathy ([place])   . GERD (gastroesophageal reflux disease)   . Anemia   . Hypercholesteremia   . CHF (congestive heart failure) (H[facility])     Preserved EF.    . Iron deficiency anemia   . Vitamin B12 deficiency   . PVD (peripheral vascular disease) ([place])   . Osteoarthritis   . CAD (coronary  artery disease)     Cath 2015, 50% calcified LAD, 60% circumflex stenosis, right coronary artery 70% stenosis medical management  . Lung cancer [facility])     New diagnosis (treated at [facility]).  Going to receive radiation  . Sleep apnea     CPAP    Past Surgical History  Procedure Laterality Date  . Tubal ligation    . Carpal tunnel release Bilateral   . Eye surgery Bilateral     cataract  . Foot surgery Bilateral     bone spurs, and repair  . Carotid endarterectomy Right 1990  . Breast biopsy Left   . Lung biopsy      non cancerous  . Colonoscopy w/ polypectomy    . Femoral-femoral bypass graft  08/07/2013    Procedure: BYPASS GRAFT FEMORAL-FEMORAL ARTERY USING 8MM X 30CM HEMASHIELD GRAFT; LEFT ILIAC ANGIOGRAM; ATTEMPTED LEFT ILIAC ARTERY STENT GRAFT;  Surgeon: Elam Dutch, MD;  Location: [place];  Service: Vascular;;  . Application of wound vac Bilateral 08/07/2013    Procedure: APPLICATION OF INCISIONAL WOUND [place];  Surgeon: Elam Dutch, MD;  Location: [place];  Service: Vascular;  Laterality: Bilateral;  . Endarterectomy femoral Left 08/07/2013    Procedure: ENDARTERECTOMY FEMORAL;  Surgeon: Elam Dutch, MD;  Location: [place];  Service: Vascular;  Laterality: Left;  . Abdominal aortagram N/A 07/25/2013    Procedure: ABDOMINAL  Maxcine Ham;  Surgeon: Serafina Mitchell, MD;  Location: [facility] CATH LAB;  Service: Cardiovascular;  Laterality: N/A;  . Left heart catheterization with coronary angiogram N/A 07/27/2013    Procedure: LEFT HEART CATHETERIZATION WITH CORONARY ANGIOGRAM;  Surgeon: Minus Breeding, MD;  Location: [facility] CATH LAB;  Service: Cardiovascular;  Laterality: N/A;     Current Outpatient Prescriptions  Medication Sig Dispense Refill  . acetaminophen-codeine (TYLENOL #3) 300-30 MG tablet Take 1 tablet by mouth 2 (two) times daily as needed for moderate pain.   0  . amLODipine (NORVASC) 10 MG tablet Take 10 mg by mouth daily.    Marland Kitchen aspirin EC 81 MG tablet Take 81 mg by mouth daily.     Marland Kitchen atorvastatin (LIPITOR) 80 MG tablet Take 80 mg by mouth daily.    . Calcium Carbonate (CALCIUM-CARB 600 PO) Take 600 mg by mouth 2 (two) times daily.    . carvedilol (COREG) 25 MG tablet Take 25 mg by mouth 2 (two) times daily with a meal.    . chlorthalidone (HYGROTON) 50 MG tablet Take 50 mg by mouth daily.    Marland Kitchen escitalopram (LEXAPRO) 20 MG tablet Take 20 mg by mouth daily.    . furosemide (LASIX) 80 MG tablet Take 1 tablet (80 mg total) by mouth daily. 90 tablet 1  . gabapentin (NEURONTIN) 100 MG capsule Take 200-300 mg by mouth 2 (two) times daily. 200 mg in the morning and 300 mg at bedtime    . insulin glargine (LANTUS) 100 UNIT/ML injection Inject 25 Units into the skin at bedtime.     Marland Kitchen tiotropium (SPIRIVA) 18 MCG inhalation capsule Place 18 mcg into inhaler and inhale daily.    . valsartan (DIOVAN) 80 MG tablet Take 80 mg by mouth daily.     No current facility-administered medications for this visit.    Allergies:   Codeine and Morphine and related    ROS:  Please see the history of present illness.   Otherwise, review of systems are positive for none.   All other systems are reviewed and negative.    PHYSICAL EXAM: VS:  BP 140/64 mmHg  Pulse 56  Ht '5\' 2"'$  (1.575 m)  Wt 187 lb 4 oz (84.936 kg)  BMI 34.24 kg/m2 , BMI Body mass index is 34.24 kg/(m^2). GENERAL:  Well appearing HEENT:  Pupils equal round and reactive, fundi not visualized, oral mucosa unremarkable NECK:  No jugular venous distention, waveform within normal limits, carotid upstroke brisk and symmetric, bilateral bruits, no thyromegaly LYMPHATICS:  No cervical, inguinal adenopathy LUNGS:  Clear to auscultation bilaterally BACK:  No CVA tenderness CHEST:  Unremarkable HEART:  PMI not displaced or sustained,S1 and S2 within normal limits, no S3, no S4, no clicks, no rubs, 2 out of 6 apical early peaking systolic murmur radiating slightly out the aortic outflow tract, no diastolic murmurs ABD:  Flat, positive  bowel sounds normal in frequency in pitch, no bruits, no rebound, no guarding, no midline pulsatile mass, no hepatomegaly, no splenomegaly EXT:  2 plus pulses upper and decreased DP/PT, no edema, no cyanosis no clubbing SKIN:  No rashes no nodules NEURO:  Cranial nerves II through XII grossly intact, motor grossly intact throughout PSYCH:  Cognitively intact, oriented to person place and time    EKG:  EKG is not ordered today.   Recent Labs: 01/31/2015: Hemoglobin 10.7*; Platelets 187 03/12/2015: BUN 39*; Creat 1.41*; Potassium 5.3; Sodium 135    Lipid Panel No results found for: CHOL, TRIG, HDL,  CHOLHDL, VLDL, LDLCALC, LDLDIRECT    Wt Readings from Last 3 Encounters:  05/08/15 187 lb 4 oz (84.936 kg)  04/02/15 194 lb 3.2 oz (88.089 kg)  03/26/15 190 lb 11.2 oz (86.501 kg)      Other studies Reviewed: Additional studies/ records that were reviewed today include: None    ASSESSMENT AND PLAN:  CHRONIC DIASTOLIC HF:  She seems to be euvolemic.  No change in therapy is indicated.   HTN:   The blood pressure is at target. No change in medications is indicated. We will continue with therapeutic lifestyle changes (TLC).  CAD:  The patient has no new sypmtoms.  No further cardiovascular testing is indicated.  We will continue with aggressive risk reduction and meds as listed.  SLEEP APNEA:  She has completed her study and wearing CPAP and doing well.  She has follow up with Dr. Claiborne Billings.  DYSLIPIDEMIA:  This is followed by her PCP.   DM:  Per Minus Breeding, MD  Current medicines are reviewed at length with the patient today.  The patient does not have concerns regarding medicines.  The following changes have been made:  no change  Labs/ tests ordered today include:  None  No orders of the defined types were placed in this encounter.     Disposition:   FU with me in 6 months    Signed, Minus Breeding, MD  05/08/2015 10:33 AM    [facility]  [facility]

## 2015-05-08 ENCOUNTER — Ambulatory Visit (INDEPENDENT_AMBULATORY_CARE_PROVIDER_SITE_OTHER): Payer: Medicare Other | Admitting: Cardiology

## 2015-05-08 ENCOUNTER — Encounter: Payer: Self-pay | Admitting: Cardiology

## 2015-05-08 VITALS — BP 140/64 | HR 56 | Ht 62.0 in | Wt 187.2 lb

## 2015-05-08 DIAGNOSIS — I5032 Chronic diastolic (congestive) heart failure: Secondary | ICD-10-CM

## 2015-05-08 NOTE — Patient Instructions (Signed)
Your physician has recommended you make the following change in your medication: NO CHANGES  If you need a refill on your cardiac medications before your next appointment, please call your pharmacy.   Your physician recommends that you schedule a follow-up appointment in: 6 MONTHS

## 2015-05-15 ENCOUNTER — Encounter: Payer: Self-pay | Admitting: Oncology

## 2015-05-16 ENCOUNTER — Telehealth: Payer: Self-pay | Admitting: *Deleted

## 2015-05-16 ENCOUNTER — Ambulatory Visit: Admission: RE | Admit: 2015-05-16 | Payer: Medicare Other | Source: Ambulatory Visit | Admitting: Radiation Oncology

## 2015-05-16 NOTE — Telephone Encounter (Signed)
Patient missed her 1 month fu appointment.  Left msg for her to call and reschedule.

## 2015-05-16 NOTE — Telephone Encounter (Signed)
error 

## 2015-05-24 ENCOUNTER — Encounter: Payer: Self-pay | Admitting: Radiation Oncology

## 2015-06-04 DIAGNOSIS — N3090 Cystitis, unspecified without hematuria: Secondary | ICD-10-CM | POA: Diagnosis not present

## 2015-06-04 DIAGNOSIS — N3001 Acute cystitis with hematuria: Secondary | ICD-10-CM | POA: Diagnosis not present

## 2015-06-13 ENCOUNTER — Ambulatory Visit
Admission: RE | Admit: 2015-06-13 | Discharge: 2015-06-13 | Disposition: A | Discharge status: Home / Self Care | Payer: Medicare Other | Source: Ambulatory Visit | Attending: Radiation Oncology | Admitting: Radiation Oncology

## 2015-06-13 ENCOUNTER — Encounter: Payer: Self-pay | Admitting: Radiation Oncology

## 2015-06-13 VITALS — BP 111/42 | HR 50 | Temp 97.6°F | Resp 20 | Ht 62.0 in | Wt 179.3 lb

## 2015-06-13 DIAGNOSIS — C3431 Malignant neoplasm of lower lobe, right bronchus or lung: Secondary | ICD-10-CM

## 2015-06-13 NOTE — Progress Notes (Signed)
[name] here for reassessment S/P SBRT to her right lower lobe. No SOB upon ambulation.   Diagnosed with C-Diff last Tuesday and is currently taking an unknown antibiotic - 10 day course.  Has lost 8lbs in one month, as reported by Daughter.  BP 111/42 mmHg  Pulse 50  Temp(Src) 97.6 F (36.4 C)  Resp 20  Ht '5\' 2"'$  (1.575 m)  Wt 179 lb 4.8 oz (81.33 kg)  BMI 32.79 kg/m2  SpO2 97%   Wt Readings from Last 3 Encounters:  06/13/15 179 lb 4.8 oz (81.33 kg)  05/08/15 187 lb 4 oz (84.936 kg)  04/02/15 194 lb 3.2 oz (88.089 kg)

## 2015-06-13 NOTE — Progress Notes (Signed)
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  Ms. Natalie Porter here for reassessment S/P SBRT to her right lower lobe. No SOB upon ambulation. Diagnosed with C-Diff last Tuesday and is currently taking an unknown antibiotic - 10 day course. Has lost 8lbs in one month, as reported by Daughter. No pain in chest, no hemoptysis.                           ALLERGIES:  is allergic to codeine and morphine and related.  Meds: Current Outpatient Prescriptions  Medication Sig Dispense Refill  . acetaminophen-codeine (TYLENOL #3) 300-30 MG tablet Take 1 tablet by mouth 2 (two) times daily as needed for moderate pain.   0  . amLODipine (NORVASC) 10 MG tablet Take 10 mg by mouth daily.    Marland Kitchen aspirin EC 81 MG tablet Take 81 mg by mouth daily.    Marland Kitchen atorvastatin (LIPITOR) 80 MG tablet Take 80 mg by mouth daily.    . Calcium Carbonate (CALCIUM-CARB 600 PO) Take 600 mg by mouth 2 (two) times daily.    . carvedilol (COREG) 25 MG tablet Take 25 mg by mouth 2 (two) times daily with a meal.    . chlorthalidone (HYGROTON) 50 MG tablet Take 50 mg by mouth daily.    Marland Kitchen escitalopram (LEXAPRO) 20 MG tablet Take 20 mg by mouth daily.    . furosemide (LASIX) 80 MG tablet Take 1 tablet (80 mg total) by mouth daily. 90 tablet 1  . gabapentin (NEURONTIN) 100 MG capsule Take 200-300 mg by mouth 2 (two) times daily. 200 mg in the morning and 300 mg at bedtime    . insulin glargine  (LANTUS) 100 UNIT/ML injection Inject 25 Units into the skin at bedtime.     Marland Kitchen tiotropium (SPIRIVA) 18 MCG inhalation capsule Place 18 mcg into inhaler and inhale daily.    . valsartan (DIOVAN) 80 MG tablet Take 80 mg by mouth daily.     No current facility-administered medications for this encounter.    Physical Findings: The patient is in no acute distress. Patient is alert and oriented.  height is '5\' 2"'$  (1.575 m) and weight is 179 lb 4.8 oz (81.33 kg). Her temperature is 97.6 F (36.4 C). Her blood pressure is 111/42 and her pulse is 50. Her respiration is 20 and oxygen saturation is 97%. .  No significant changes. No palpable cervical, supraclavicular or axillary lymphoadenopathy. The heart has a regular rate and rhythm. The lungs are clear to auscultation.   Lab Findings: Lab Results  Component Value Date   WBC 7.5 01/31/2015   HGB 10.7* 01/31/2015   HCT 34.4* 01/31/2015   MCV 96.4 01/31/2015   PLT 187 01/31/2015    Radiographic Findings: No results found.  Impression:  No evidence of recurrence on clinical exam today.  No apparent side effects from SBRT.   Plan: The patient would prefer to get her chest  CT in [place], I will let Dr.McCarty order this. Follow up in RadOnc PRN. Long-term follow up with Dr.McCarty in [place]. She is scheduled to see Dr. Hinton Porter in April.  -----------------------------------  Natalie Promise, PhD, MD  This document serves as a record of services personally performed by Natalie Pray, MD. It was created on his behalf by Natalie Porter, a trained medical scribe. The creation of this record is based on the scribe's personal observations and the provider's statements to them. This document has been checked and approved by the attending provider.

## 2015-07-02 ENCOUNTER — Encounter: Payer: Self-pay | Admitting: Cardiology

## 2015-07-08 ENCOUNTER — Ambulatory Visit (INDEPENDENT_AMBULATORY_CARE_PROVIDER_SITE_OTHER): Payer: Medicare Other | Admitting: Cardiovascular Disease

## 2015-07-08 ENCOUNTER — Encounter: Payer: Self-pay | Admitting: Cardiovascular Disease

## 2015-07-08 VITALS — BP 134/53 | HR 50 | Ht 62.0 in | Wt 184.0 lb

## 2015-07-08 DIAGNOSIS — I10 Essential (primary) hypertension: Secondary | ICD-10-CM

## 2015-07-08 DIAGNOSIS — G4733 Obstructive sleep apnea (adult) (pediatric): Secondary | ICD-10-CM

## 2015-07-08 DIAGNOSIS — Z9989 Dependence on other enabling machines and devices: Principal | ICD-10-CM

## 2015-07-08 DIAGNOSIS — I2510 Atherosclerotic heart disease of native coronary artery without angina pectoris: Secondary | ICD-10-CM | POA: Diagnosis not present

## 2015-07-08 NOTE — Progress Notes (Signed)
Patient ID: Natalie Porter, female   DOB: 1941/01/14, 75 y.o.   MRN: [id number]     HPI: Natalie Porter is a 75 y.o. female who presents to sleep clinic following initiation of CPAP therapy for obstructive sleep apnea.  Natalie Porter is a 75 year old white female patient of Dr. Burna Sis, chronic.  She has a history of known CAD and has been on medical therapy.  She has a history of documented normal systolic function but it had heart failure with preserved ejection fraction.  She has a history of diabetes mellitus, stage III renal insufficiency, history of COPD, GERD, hyperlipidemia, and remote DVT.  Due to concerns for obstructive sleep apnea she was referred for a sleep study which was done at the [place] [facility] on 03/24/2015.  On the diagnostic portion of this study.  She had reduced sleep efficiency at 80.9%.  She was found to have moderate obstructive sleep apnea with an AHI of 26.2.  Overall, but this was severe during sleep at 34.8.  She desaturated.  Her oxygen 85%.  There was evidence for soft snoring volume.  There were occasional PVCs throughout the study.  She was titrated up to 11 cm water pressure.  Since initiating CPAP therapy, she has felt improved.  She is more alert.  She denies residual daytime sleepiness.  She is unaware of any breakthrough snoring.  A download was obtained from 06/08/2015 through 07/07/2015.  She is 100% compliant with days used greater than 4 hours use.  She is averaging 9 hours and 26 minutes of sleep per night.  Her AHI is excellent at 0.5 per hour.  At her set pressure of 11 cm water.  An Epworth Sleepiness Scale score is a shown below and argues against residual daytime sleepiness.  Epworth Sleepiness Scale: Situation   Chance of Dozing/Sleeping (0 = never , 1 = slight chance , 2 = moderate chance , 3 = high chance )   sitting and reading 1   watching TV 1   sitting inactive in a public place 0   being a passenger in a motor vehicle for an hour or more 0   lying down in the afternoon 3   sitting and talking to someone 0   sitting quietly after lunch (no alcohol) 0   while stopped for a few minutes in traffic as the driver 0   Total Score  5    Past Medical History  Diagnosis Date  . Diabetes mellitus without complication ([place])   . Hypertension   . Renal disorder     stage 3  . Gallstone   . Arthritis   . DVT (deep venous thrombosis) ([place])     2011  . COPD (chronic obstructive pulmonary disease) ([place])   . Pneumonia 2014  . Neuropathy ([place])   . GERD (gastroesophageal reflux disease)   . Anemia   . Hypercholesteremia   . CHF (congestive heart failure) ([facility])     Preserved EF.    . Iron deficiency anemia   . Vitamin B12 deficiency   . PVD (peripheral vascular disease) ([place])   . Osteoarthritis   . CAD (coronary artery disease)     Cath 2015, 50% calcified LAD, 60% circumflex stenosis, right coronary artery 70% stenosis medical management  . Lung cancer [facility])     New diagnosis (treated at [facility]).  Going to receive radiation  . Sleep apnea     CPAP  .  Radiation 03/26/15, 03/28/15, 04/02/15    right lower lobe nodule SBRT 54 gray    Past Surgical History  Procedure Laterality Date  . Tubal ligation    . Carpal tunnel release Bilateral   . Eye surgery Bilateral     cataract  . Foot surgery Bilateral     bone spurs, and repair  . Carotid endarterectomy Right 1990  . Breast biopsy Left   . Lung biopsy      non cancerous  . Colonoscopy w/ polypectomy    . Femoral-femoral bypass graft  08/07/2013    Procedure: BYPASS GRAFT FEMORAL-FEMORAL ARTERY USING 8MM X 30CM HEMASHIELD GRAFT; LEFT ILIAC ANGIOGRAM; ATTEMPTED LEFT ILIAC ARTERY STENT GRAFT;  Surgeon: Elam Dutch, MD;  Location: [place];  Service: Vascular;;  . Application of wound vac Bilateral 08/07/2013    Procedure: APPLICATION OF INCISIONAL WOUND [place];  Surgeon: Elam Dutch, MD;  Location: [place];  Service:  Vascular;  Laterality: Bilateral;  . Endarterectomy femoral Left 08/07/2013    Procedure: ENDARTERECTOMY FEMORAL;  Surgeon: Elam Dutch, MD;  Location: [place];  Service: Vascular;  Laterality: Left;  . Abdominal aortagram N/A 07/25/2013    Procedure: ABDOMINAL Maxcine Ham;  Surgeon: Serafina Mitchell, MD;  Location[facility] CATH LAB;  Service: Cardiovascular;  Laterality: N/A;  . Left heart catheterization with coronary angiogram N/A 07/27/2013    Procedure: LEFT HEART CATHETERIZATION WITH CORONARY ANGIOGRAM;  Surgeon: Minus Breeding, MD;  Location: [facility] CATH LAB;  Service: Cardiovascular;  Laterality: N/A;    Allergies  Allergen Reactions  . Codeine Nausea Only  . Morphine And Related Nausea And Vomiting    Current Outpatient Prescriptions  Medication Sig Dispense Refill  . acetaminophen-codeine (TYLENOL #3) 300-30 MG tablet Take 1 tablet by mouth 2 (two) times daily as needed for moderate pain.   0  . amLODipine (NORVASC) 10 MG tablet Take 10 mg by mouth daily.    Marland Kitchen aspirin EC 81 MG tablet Take 81 mg by mouth daily.    Marland Kitchen atorvastatin (LIPITOR) 80 MG tablet Take 80 mg by mouth daily.    . Calcium Carbonate (CALCIUM-CARB 600 PO) Take 600 mg by mouth 2 (two) times daily.    . carvedilol (COREG) 25 MG tablet Take 25 mg by mouth 2 (two) times daily with a meal.    . chlorthalidone (HYGROTON) 50 MG tablet Take 50 mg by mouth daily.    Marland Kitchen escitalopram (LEXAPRO) 20 MG tablet Take 20 mg by mouth daily.    . furosemide (LASIX) 80 MG tablet Take 1 tablet (80 mg total) by mouth daily. 90 tablet 1  . gabapentin (NEURONTIN) 100 MG capsule Take 200-300 mg by mouth 2 (two) times daily. 200 mg in the morning and 300 mg at bedtime    . insulin glargine (LANTUS) 100 UNIT/ML injection Inject 25 Units into the skin at bedtime.     Marland Kitchen tiotropium (SPIRIVA) 18 MCG inhalation capsule Place 18 mcg into inhaler and inhale daily.    . valsartan (DIOVAN) 80 MG tablet Take 80 mg by mouth daily.     No current  facility-administered medications for this visit.    Social History   Social History  . Marital Status: Widowed    Spouse Name: N/A  . Number of Children: 4  . Years of Education: N/A   Occupational History  . Not on file.   Social History Main Topics  . Smoking status: Former Smoker -- 1.00 packs/day for 34 years  Types: Cigarettes    Quit date: 03/28/1988  . Smokeless tobacco: Never Used  . Alcohol Use: No  . Drug Use: No  . Sexual Activity: Not on file     Comment: quit 1990   Other Topics Concern  . Not on file   Social History Narrative   Lives alone. Widowed.   Retired.    Family History  Problem Relation Age of Onset  . Heart attack Mother 23  . Stroke Father   . Heart attack Father 27  . Cancer Sister 16    Uterian  . Cancer Sister     Lung     ROS General: Negative; No fevers, chills, or night sweats HEENT: Negative; No changes in vision or hearing, sinus congestion, difficulty swallowing Pulmonary: Positive for COPD Cardiovascular: Negative; No chest pain, presyncope, syncope, palpatations GI: Negative; No nausea, vomiting, diarrhea, or abdominal pain GU: Negative; No dysuria, hematuria, or difficulty voiding Musculoskeletal: Negative; no myalgias, joint pain, or weakness Hematologic: Negative; no easy bruising, bleeding Endocrine: Positive for diabetes mellitus.; no heat/cold intolerance Neuro: Negative; no changes in balance, headaches Skin: Negative; No rashes or skin lesions Psychiatric: Negative; No behavioral problems, depression Sleep: Positive for obstructive sleep apnea, now on CPAP.; No daytime sleepiness, hypersomnolence, bruxism, restless legs, hypnogognic hallucinations, no cataplexy   Physical Exam BP 134/53 mmHg  Pulse 50  Ht '5\' 2"'  (1.575 m)  Wt 184 lb (83.462 kg)  BMI 33.65 kg/m2  Wt Readings from Last 3 Encounters:  07/08/15 184 lb (83.462 kg)  06/13/15 179 lb 4.8 oz (81.33 kg)  05/08/15 187 lb 4 oz (84.936 kg)    General: Alert, oriented, no distress.  Skin: normal turgor, no rashes HEENT: Normocephalic, atraumatic. Pupils round and reactive; sclera anicteric; extraocular muscles intact; Fundi Without hemorrhages or exudates Nose without nasal septal hypertrophy Mouth/Parynx benign; Mallinpatti scale 3 Neck: No JVD, no carotid briuts Lungs: clear to ausculatation and percussion; no wheezing or rales  Chest wall: No tenderness to palpation Heart: RRR, s1 s2 normal; 2/6 systolic murmur in the aortic region. Abdomen: soft, nontender; no hepatosplenomehaly, BS+; abdominal aorta nontender and not dilated by palpation. Back: No CVA tenderness Pulses 2+ Extremities: no clubbinbg cyanosis or edema, Homan's sign negative  Neurologic: grossly nonfocal; cranial nerves intact. Psychological: Normal affect and mood.  ECG (independently read by me): Not done today  LABS:  BMP Latest Ref Rng 03/12/2015 02/28/2015 01/31/2015  Glucose 65 - 99 mg/dL 204(H) 134(H) 121(H)  BUN 7 - 25 mg/dL 39(H) 27(H) 28(H)  Creatinine 0.60 - 0.93 mg/dL 1.41(H) 1.39(H) 1.42(H)  Sodium 135 - 146 mmol/L 135 143 136  Potassium 3.5 - 5.3 mmol/L 5.3 4.8 5.8(H)  Chloride 98 - 110 mmol/L 105 107 111  CO2 20 - 31 mmol/L 23 23 18(L)  Calcium 8.6 - 10.4 mg/dL 8.9 9.5 8.7(L)     Hepatic Function Latest Ref Rng 08/12/2013 07/31/2013 07/22/2013  Total Protein 6.0 - 8.3 g/dL 6.1 6.3 6.4  Albumin 3.5 - 5.2 g/dL 2.0(L) 2.4(L) 2.4(L)  AST 0 - 37 U/L '14 24 16  ' ALT 0 - 35 U/L '12 15 11  ' Alk Phosphatase 39 - 117 U/L 203(H) 37(L) 30(L)  Total Bilirubin 0.3 - 1.2 mg/dL 0.2(L) <0.2(L) <0.2(L)     CBC Latest Ref Rng 01/31/2015 08/12/2013 08/11/2013  WBC 4.0 - 10.5 K/uL 7.5 7.1 7.4  Hemoglobin 12.0 - 15.0 g/dL 10.7(L) 8.5(L) 9.2(L)  Hematocrit 36.0 - 46.0 % 34.4(L) 26.5(L) 28.4(L)  Platelets 150 - 400 K/uL 187  186 202     Lipid Panel  No results found for: CHOL, TRIG, HDL, CHOLHDL, VLDL, LDLCALC, LDLDIRECT   RADIOLOGY: No results  found.    ASSESSMENT AND PLAN: Natalie Porter is a 75 year old female who has a history of mild CAD, treated medically, a prior history of probable diastolic heart failure, a remote tobacco history but unfortunately quit in 1990, and diabetes mellitus.  She underwent a diagnostic sleep study which confirmed at least moderate sleep apnea with an AHI of 26.2/h overall, but this was severe during REM sleep at 34.8 per hour.  I reviewed her sleep study at length with her in detail.  I reviewed her download and she is meeting Medicare compliance standards.  She is averaging 9 hours and 26 minutes per night of sleep and is 100% compliant with usage.  Her AHI is excellent.  She is using a nasal pillow mask.  Her compliance report indicates that there is a mask leak.  We will be using [facility] for her CPAP supplies.  They will need to make certain her mask is appropriately fitted to reduce the leak noted on her compliance report.  Clinically, she is more late and is sleeping well.  Her blood pressure today is stable and on repeat by me was 124/64.  Rhythm is stable.  She denies any awareness of nocturnal palpitations but was noted to have an occasional PVC on her sleep study.  I answered all her questions.  I wrote a prescription to be sent to [facility] for her sleep apnea supplies.  Per Medicare requirements, I will see her one year for follow-up sleep evaluation.   Troy Sine, MD, [facility]  07/08/2015 5:12 PM

## 2015-07-08 NOTE — Patient Instructions (Addendum)
Your physician recommends that you schedule a follow-up appointment in: 1 year or sooner if needed with Dr Claiborne Billings for sleep.

## 2015-07-09 DIAGNOSIS — G629 Polyneuropathy, unspecified: Secondary | ICD-10-CM | POA: Diagnosis not present

## 2015-07-09 DIAGNOSIS — E1121 Type 2 diabetes mellitus with diabetic nephropathy: Secondary | ICD-10-CM | POA: Diagnosis not present

## 2015-07-09 DIAGNOSIS — I110 Hypertensive heart disease with heart failure: Secondary | ICD-10-CM | POA: Diagnosis not present

## 2015-07-09 DIAGNOSIS — I739 Peripheral vascular disease, unspecified: Secondary | ICD-10-CM | POA: Diagnosis not present

## 2015-07-10 ENCOUNTER — Encounter: Payer: Self-pay | Admitting: Family

## 2015-07-18 ENCOUNTER — Encounter (HOSPITAL_COMMUNITY): Payer: Medicare Other

## 2015-07-18 ENCOUNTER — Ambulatory Visit (HOSPITAL_COMMUNITY): Payer: Medicare Other

## 2015-07-18 ENCOUNTER — Ambulatory Visit: Payer: Medicare Other | Admitting: Family

## 2015-07-24 ENCOUNTER — Encounter: Payer: Self-pay | Admitting: Family

## 2015-07-24 ENCOUNTER — Ambulatory Visit (INDEPENDENT_AMBULATORY_CARE_PROVIDER_SITE_OTHER): Payer: Medicare Other | Admitting: Family

## 2015-07-24 ENCOUNTER — Ambulatory Visit (HOSPITAL_COMMUNITY)
Admission: RE | Admit: 2015-07-24 | Discharge: 2015-07-24 | Disposition: A | Discharge status: Home / Self Care | Payer: Medicare Other | Source: Ambulatory Visit | Attending: Family | Admitting: Family

## 2015-07-24 VITALS — BP 167/67 | HR 55 | Ht 62.0 in | Wt 177.7 lb

## 2015-07-24 DIAGNOSIS — E7800 Pure hypercholesterolemia, unspecified: Secondary | ICD-10-CM | POA: Diagnosis not present

## 2015-07-24 DIAGNOSIS — Z9889 Other specified postprocedural states: Secondary | ICD-10-CM

## 2015-07-24 DIAGNOSIS — I708 Atherosclerosis of other arteries: Secondary | ICD-10-CM | POA: Insufficient documentation

## 2015-07-24 DIAGNOSIS — I6522 Occlusion and stenosis of left carotid artery: Secondary | ICD-10-CM | POA: Insufficient documentation

## 2015-07-24 DIAGNOSIS — Z48812 Encounter for surgical aftercare following surgery on the circulatory system: Secondary | ICD-10-CM

## 2015-07-24 DIAGNOSIS — E1140 Type 2 diabetes mellitus with diabetic neuropathy, unspecified: Secondary | ICD-10-CM | POA: Insufficient documentation

## 2015-07-24 DIAGNOSIS — Z95828 Presence of other vascular implants and grafts: Secondary | ICD-10-CM

## 2015-07-24 DIAGNOSIS — I110 Hypertensive heart disease with heart failure: Secondary | ICD-10-CM | POA: Diagnosis not present

## 2015-07-24 DIAGNOSIS — I509 Heart failure, unspecified: Secondary | ICD-10-CM | POA: Insufficient documentation

## 2015-07-24 DIAGNOSIS — I779 Disorder of arteries and arterioles, unspecified: Secondary | ICD-10-CM | POA: Diagnosis present

## 2015-07-24 DIAGNOSIS — I739 Peripheral vascular disease, unspecified: Secondary | ICD-10-CM | POA: Diagnosis not present

## 2015-07-24 DIAGNOSIS — Z87891 Personal history of nicotine dependence: Secondary | ICD-10-CM

## 2015-07-24 DIAGNOSIS — K219 Gastro-esophageal reflux disease without esophagitis: Secondary | ICD-10-CM | POA: Diagnosis not present

## 2015-07-24 DIAGNOSIS — I6521 Occlusion and stenosis of right carotid artery: Secondary | ICD-10-CM

## 2015-07-24 DIAGNOSIS — I2510 Atherosclerotic heart disease of native coronary artery without angina pectoris: Secondary | ICD-10-CM | POA: Insufficient documentation

## 2015-07-24 DIAGNOSIS — I771 Stricture of artery: Secondary | ICD-10-CM

## 2015-07-24 NOTE — Progress Notes (Signed)
[facility] [place] HISTORY AND PHYSICAL   MRN : [id number]  History of Present Illness:   Natalie Porter is a 75 y.o. female patient of Dr. Oneida Alar who is s/p right to left Fem-fem by pass graft, 08/07/2013. She also had a right CIA stent placed 08/09/13. She had a right CEA in [place], in [place].  She returns today for follow up. She is walking without claudication symptoms, but she does not walk much, states her dyspnea limits her walking. She does occasionally get some swelling of her legs when she is on her feet all day. She is not smoking. She reports tingling neuropathy in her feet.   Pt describes transient amaurosis fugax of right eye prior to right carotid stenting, denies any history of hemiparesis or speech difficulties.  On 11/23/13 Graft duplex scan performed showed a patent femoral-femoral bypass with possibly some increased velocities at the proximal anastomosis between 388 and 400 toe pressure right-side 69 left side 54. ABIs artificially inflated secondary to calcification.  She uses CPAP for her OSA. She sees her pulmonologist, Dr. Lamonte Sakai, surveillance of lung nodules per daughter.  Pt Diabetic: Yes,  dtr states last A1C was 8.28 June 2015, increased from 6.8 Pt smoker: former smoker, quit in the remote past  Pt meds include: Statin :Yes Betablocker: Yes ASA: Yes Other anticoagulants/antiplatelets: no   Current Outpatient Prescriptions  Medication Sig Dispense Refill  . acetaminophen-codeine (TYLENOL #3) 300-30 MG tablet Take 1 tablet by mouth 2 (two) times daily as needed for moderate pain.   0  . amLODipine (NORVASC) 10 MG tablet Take 10 mg by mouth daily.    Marland Kitchen aspirin EC 81 MG tablet Take 81 mg by mouth daily.    Marland Kitchen atorvastatin (LIPITOR) 80 MG tablet Take 80 mg by mouth daily.    . Calcium Carbonate (CALCIUM-CARB 600 PO) Take 600 mg by mouth 2 (two) times daily.    . carvedilol (COREG) 25 MG tablet Take 25 mg by mouth 2 (two)  times daily with a meal.    . chlorthalidone (HYGROTON) 50 MG tablet Take 50 mg by mouth daily.    Marland Kitchen escitalopram (LEXAPRO) 20 MG tablet Take 20 mg by mouth daily.    . furosemide (LASIX) 80 MG tablet Take 1 tablet (80 mg total) by mouth daily. 90 tablet 1  . gabapentin (NEURONTIN) 100 MG capsule Take 200-300 mg by mouth 2 (two) times daily. 200 mg in the morning and 300 mg at bedtime    . insulin glargine (LANTUS) 100 UNIT/ML injection Inject 30 Units into the skin at bedtime.     Marland Kitchen tiotropium (SPIRIVA) 18 MCG inhalation capsule Place 18 mcg into inhaler and inhale daily.    . valsartan (DIOVAN) 80 MG tablet Take 80 mg by mouth daily.     No current facility-administered medications for this visit.    Past Medical History  Diagnosis Date  . Diabetes mellitus without complication ([place])   . Hypertension   . Renal disorder     stage 3  . Gallstone   . Arthritis   . DVT (deep venous thrombosis) ([place])     2011  . COPD (chronic obstructive pulmonary disease) ([place])   . Pneumonia 2014  . Neuropathy ([place])   . GERD (gastroesophageal reflux disease)   . Anemia   . Hypercholesteremia   . CHF (congestive heart failure) ([facility])     Preserved EF.    . Iron deficiency anemia   . Vitamin  B12 deficiency   . PVD (peripheral vascular disease) ([place])   . Osteoarthritis   . CAD (coronary artery disease)     Cath 2015, 50% calcified LAD, 60% circumflex stenosis, right coronary artery 70% stenosis medical management  . Lung cancer [facility])     New diagnosis (treated at [facility]).  Going to receive radiation  . Sleep apnea     CPAP  . Radiation 03/26/15, 03/28/15, 04/02/15    right lower lobe nodule SBRT 54 gray    Social History Social History  Substance Use Topics  . Smoking status: Former Smoker -- 1.00 packs/day for 34 years    Types: Cigarettes    Quit date: 03/28/1988  . Smokeless tobacco: Never Used  . Alcohol Use: No    Family History Family History  Problem Relation Age of Onset  .  Heart attack Mother 65  . Stroke Father   . Heart attack Father 2  . Cancer Sister 22    Uterian  . Cancer Sister     Lung    Surgical History Past Surgical History  Procedure Laterality Date  . Tubal ligation    . Carpal tunnel release Bilateral   . Eye surgery Bilateral     cataract  . Foot surgery Bilateral     bone spurs, and repair  . Carotid endarterectomy Right 1990  . Breast biopsy Left   . Lung biopsy      non cancerous  . Colonoscopy w/ polypectomy    . Femoral-femoral bypass graft  08/07/2013    Procedure: BYPASS GRAFT FEMORAL-FEMORAL ARTERY USING 8MM X 30CM HEMASHIELD GRAFT; LEFT ILIAC ANGIOGRAM; ATTEMPTED LEFT ILIAC ARTERY STENT GRAFT;  Surgeon: Elam Dutch, MD;  Location: [facility];  Service: Vascular;;  . Application of wound vac Bilateral 08/07/2013    Procedure: APPLICATION OF INCISIONAL WOUND [place];  Surgeon: Elam Dutch, MD;  Location: [facility];  Service: Vascular;  Laterality: Bilateral;  . Endarterectomy femoral Left 08/07/2013    Procedure: ENDARTERECTOMY FEMORAL;  Surgeon: Elam Dutch, MD;  Location: [facility];  Service: Vascular;  Laterality: Left;  . Abdominal aortagram N/A 07/25/2013    Procedure: ABDOMINAL Maxcine Ham;  Surgeon: Serafina Mitchell, MD;  Location: [facility] CATH LAB;  Service: Cardiovascular;  Laterality: N/A;  . Left heart catheterization with coronary angiogram N/A 07/27/2013    Procedure: LEFT HEART CATHETERIZATION WITH CORONARY ANGIOGRAM;  Surgeon: Minus Breeding, MD;  Location: [facility] CATH LAB;  Service: Cardiovascular;  Laterality: N/A;    Allergies  Allergen Reactions  . Codeine Nausea Only  . Morphine And Related Nausea And Vomiting    Current Outpatient Prescriptions  Medication Sig Dispense Refill  . acetaminophen-codeine (TYLENOL #3) 300-30 MG tablet Take 1 tablet by mouth 2 (two) times daily as needed for moderate pain.   0  . amLODipine (NORVASC) 10 MG tablet Take 10 mg by mouth daily.    Marland Kitchen aspirin EC 81 MG tablet Take 81 mg by mouth  daily.    Marland Kitchen atorvastatin (LIPITOR) 80 MG tablet Take 80 mg by mouth daily.    . Calcium Carbonate (CALCIUM-CARB 600 PO) Take 600 mg by mouth 2 (two) times daily.    . carvedilol (COREG) 25 MG tablet Take 25 mg by mouth 2 (two) times daily with a meal.    . chlorthalidone (HYGROTON) 50 MG tablet Take 50 mg by mouth daily.    Marland Kitchen escitalopram (LEXAPRO) 20 MG tablet Take 20 mg by mouth daily.    . furosemide (LASIX)  80 MG tablet Take 1 tablet (80 mg total) by mouth daily. 90 tablet 1  . gabapentin (NEURONTIN) 100 MG capsule Take 200-300 mg by mouth 2 (two) times daily. 200 mg in the morning and 300 mg at bedtime    . insulin glargine (LANTUS) 100 UNIT/ML injection Inject 30 Units into the skin at bedtime.     Marland Kitchen tiotropium (SPIRIVA) 18 MCG inhalation capsule Place 18 mcg into inhaler and inhale daily.    . valsartan (DIOVAN) 80 MG tablet Take 80 mg by mouth daily.     No current facility-administered medications for this visit.     REVIEW OF SYSTEMS: See HPI for pertinent positives and negatives.  Physical Examination Filed Vitals:   07/24/15 1325 07/24/15 1326  BP: 178/72 167/67  Pulse: 55   Height: '5\' 2"'$  (1.575 m)   Weight: 177 lb 11.2 oz (80.604 kg)   SpO2: 98%    Body mass index is 32.49 kg/(m^2).  General: A&O x 3, WDWN, obese female. Gait: normal Eyes: PERRLA. Pulmonary: CTAB, without wheezes , rales or rhonchi. Cardiac: regular rhythm, no detected murmur.     Carotid Bruits Right Left   Positive Positive  Aorta is not palpable. Radial pulses: 2+ palpable and =   VASCULAR EXAM: Extremities without ischemic changes  without Gangrene; without open wounds.     LE Pulses Right Left   FEMORAL not palpable (obese) not palpable (obese)    POPLITEAL not  palpable  not palpable   POSTERIOR TIBIAL not palpable   palpable    DORSALIS PEDIS  ANTERIOR TIBIAL faintly palpable  not palpable    Abdomen: soft, NT, no palpable masses. Skin: no rashes, no ulcers. Musculoskeletal: no muscle wasting or atrophy. Neurologic: A&O X 3; Appropriate Affect ; SENSATION: normal; MOTOR FUNCTION: moving all extremities equally, motor strength 5/5 throughout. Speech is fluent/normal. CN 2-12 intact except for significant hearing loss.                     Non-Invasive Vascular Imaging (07/24/2015):  Carotid Duplex:  Left subclavian artery velocity: 410 cm/s Patent right CEA with no restenosis Bilateral vertebral artery is antegrade, however, the left is abnormal No significant change compared to prior exam  ASSESSMENT:  Areona Homer is a 75 y.o. female who is s/p right to left Fem-fem by pass graft 08/07/2013. She also had a right CIA stent placed 08/09/13. She had a right CEA in [place], in [place]. She is walking without claudication symptoms, no tissue loss in LE's. However, she probably does not walk enough to elicit claudication symptoms as her dyspnea and hip pain limit her walking. Her daughter states that she could walk more but is not motivated.   She has a history of transient amaurosis fugax of right eye prior to the right carotid stenting in 2015, no stroke or TIA since then. Pt denies steal sx's in either upper extremity.  Today's carotid duplex suggests a left subclavian artery velocity: 410 cm/s Patent right CEA with no restenosis Bilateral vertebral artery is antegrade, however, the left is abnormal No significant change compared to prior exam   PLAN:   Based on today's exam and non-invasive vascular lab results, the patient will follow up in December 2017 with the following tests: ABI's and carotid duplex, already scheduled to see Dr. Oneida Alar.  Discussed in depth  with the patient the nature of atherosclerosis, and emphasized the importance of maximal medical management including strict control of blood pressure,  blood glucose, and lipid levels, obtaining regular exercise, and cessation of smoking.  The patient is aware that without maximal medical management the underlying atherosclerotic disease process will progress, limiting the benefit of any interventions.  The patient was given information about stroke prevention and what symptoms should prompt the patient to seek immediate medical care.  The patient was given information about PAD including signs, symptoms, treatment, what symptoms should prompt the patient to seek immediate medical care, and risk reduction measures to take. Thank you for allowing [place] to participate in this patient's care.  Clemon Chambers, RN, MSN, FNP-C [facility] Office: [phone]  Clinic MD: Scot Dock 07/24/2015 2:00 PM

## 2015-07-24 NOTE — Patient Instructions (Signed)
Stroke Prevention Some medical conditions and behaviors are associated with an increased chance of having a stroke. You may prevent a stroke by making healthy choices and managing medical conditions. HOW CAN I REDUCE MY RISK OF HAVING A STROKE?   Stay physically active. Get at least 30 minutes of activity on most or all days.  Do not smoke. It may also be helpful to avoid exposure to secondhand smoke.  Limit alcohol use. Moderate alcohol use is considered to be:  No more than 2 drinks per day for men.  No more than 1 drink per day for nonpregnant women.  Eat healthy foods. This involves:  Eating 5 or more servings of fruits and vegetables a day.  Making dietary changes that address high blood pressure (hypertension), high cholesterol, diabetes, or obesity.  Manage your cholesterol levels.  Making food choices that are high in fiber and low in saturated fat, trans fat, and cholesterol may control cholesterol levels.  Take any prescribed medicines to control cholesterol as directed by your health care provider.  Manage your diabetes.  Controlling your carbohydrate and sugar intake is recommended to manage diabetes.  Take any prescribed medicines to control diabetes as directed by your health care provider.  Control your hypertension.  Making food choices that are low in salt (sodium), saturated fat, trans fat, and cholesterol is recommended to manage hypertension.  Ask your health care provider if you need treatment to lower your blood pressure. Take any prescribed medicines to control hypertension as directed by your health care provider.  If you are 18-39 years of age, have your blood pressure checked every 3-5 years. If you are 40 years of age or older, have your blood pressure checked every year.  Maintain a healthy weight.  Reducing calorie intake and making food choices that are low in sodium, saturated fat, trans fat, and cholesterol are recommended to manage  weight.  Stop drug abuse.  Avoid taking birth control pills.  Talk to your health care provider about the risks of taking birth control pills if you are over 35 years old, smoke, get migraines, or have ever had a blood clot.  Get evaluated for sleep disorders (sleep apnea).  Talk to your health care provider about getting a sleep evaluation if you snore a lot or have excessive sleepiness.  Take medicines only as directed by your health care provider.  For some people, aspirin or blood thinners (anticoagulants) are helpful in reducing the risk of forming abnormal blood clots that can lead to stroke. If you have the irregular heart rhythm of atrial fibrillation, you should be on a blood thinner unless there is a good reason you cannot take them.  Understand all your medicine instructions.  Make sure that other conditions (such as anemia or atherosclerosis) are addressed. SEEK IMMEDIATE MEDICAL CARE IF:   You have sudden weakness or numbness of the face, arm, or leg, especially on one side of the body.  Your face or eyelid droops to one side.  You have sudden confusion.  You have trouble speaking (aphasia) or understanding.  You have sudden trouble seeing in one or both eyes.  You have sudden trouble walking.  You have dizziness.  You have a loss of balance or coordination.  You have a sudden, severe headache with no known cause.  You have new chest pain or an irregular heartbeat. Any of these symptoms may represent a serious problem that is an emergency. Do not wait to see if the symptoms will   go away. Get medical help at once. Call your local emergency services (911 in [place] Do not drive yourself to the hospital.   This information is not intended to replace advice given to you by your health care provider. Make sure you discuss any questions you have with your health care provider.   Document Released: 04/16/2004 Document Revised: 03/30/2014 Document Reviewed:  09/09/2012 Elsevier Interactive Patient Education 2016 Elsevier Inc.  

## 2015-07-25 DIAGNOSIS — H4710 Unspecified papilledema: Secondary | ICD-10-CM | POA: Diagnosis not present

## 2015-07-25 DIAGNOSIS — H26492 Other secondary cataract, left eye: Secondary | ICD-10-CM | POA: Diagnosis not present

## 2015-07-25 DIAGNOSIS — H348192 Central retinal vein occlusion, unspecified eye, stable: Secondary | ICD-10-CM | POA: Diagnosis not present

## 2015-07-29 DIAGNOSIS — R918 Other nonspecific abnormal finding of lung field: Secondary | ICD-10-CM | POA: Diagnosis not present

## 2015-07-29 DIAGNOSIS — R590 Localized enlarged lymph nodes: Secondary | ICD-10-CM | POA: Diagnosis not present

## 2015-07-29 DIAGNOSIS — C3431 Malignant neoplasm of lower lobe, right bronchus or lung: Secondary | ICD-10-CM | POA: Diagnosis not present

## 2015-07-29 DIAGNOSIS — C3490 Malignant neoplasm of unspecified part of unspecified bronchus or lung: Secondary | ICD-10-CM | POA: Diagnosis not present

## 2015-07-29 DIAGNOSIS — I6523 Occlusion and stenosis of bilateral carotid arteries: Secondary | ICD-10-CM | POA: Diagnosis not present

## 2015-07-29 DIAGNOSIS — C3432 Malignant neoplasm of lower lobe, left bronchus or lung: Secondary | ICD-10-CM | POA: Diagnosis not present

## 2015-07-29 DIAGNOSIS — R930 Abnormal findings on diagnostic imaging of skull and head, not elsewhere classified: Secondary | ICD-10-CM | POA: Diagnosis not present

## 2015-07-29 DIAGNOSIS — C3412 Malignant neoplasm of upper lobe, left bronchus or lung: Secondary | ICD-10-CM | POA: Diagnosis not present

## 2015-07-29 DIAGNOSIS — R51 Headache: Secondary | ICD-10-CM | POA: Diagnosis not present

## 2015-07-31 DIAGNOSIS — J449 Chronic obstructive pulmonary disease, unspecified: Secondary | ICD-10-CM | POA: Diagnosis not present

## 2015-07-31 DIAGNOSIS — Z7901 Long term (current) use of anticoagulants: Secondary | ICD-10-CM | POA: Diagnosis not present

## 2015-07-31 DIAGNOSIS — D649 Anemia, unspecified: Secondary | ICD-10-CM | POA: Diagnosis not present

## 2015-07-31 DIAGNOSIS — N183 Chronic kidney disease, stage 3 (moderate): Secondary | ICD-10-CM | POA: Diagnosis not present

## 2015-07-31 DIAGNOSIS — C3431 Malignant neoplasm of lower lobe, right bronchus or lung: Secondary | ICD-10-CM | POA: Diagnosis not present

## 2015-07-31 DIAGNOSIS — C3412 Malignant neoplasm of upper lobe, left bronchus or lung: Secondary | ICD-10-CM | POA: Diagnosis not present

## 2015-07-31 DIAGNOSIS — E875 Hyperkalemia: Secondary | ICD-10-CM | POA: Diagnosis not present

## 2015-08-01 ENCOUNTER — Telehealth: Payer: Self-pay | Admitting: Cardiovascular Disease

## 2015-08-01 NOTE — Telephone Encounter (Signed)
SPOKE WITH DAUGHTER DAUGHTER STATES PATIENT HAS BEEN AT 3 DIFFERENT HOMES LATELY. DAUGHTER STATES SHE IS NOT SURE IF SETTINGS WERE CHANGED  PER NOTES FROM 03/2015-- CPAP therapy with EPR of 3 at 11 cm H2O  Information given to daughter, recommend patient's to take equipment to [facility]   daughter verbalized understanding.

## 2015-08-01 NOTE — Telephone Encounter (Signed)
New message    The daughter is wanting to speak regarding the setting on her Mom's CPAP machine, The pt thinks the setting have been changed.

## 2015-08-07 DIAGNOSIS — C3490 Malignant neoplasm of unspecified part of unspecified bronchus or lung: Secondary | ICD-10-CM | POA: Diagnosis not present

## 2015-08-07 DIAGNOSIS — R911 Solitary pulmonary nodule: Secondary | ICD-10-CM | POA: Diagnosis not present

## 2015-08-07 DIAGNOSIS — C3431 Malignant neoplasm of lower lobe, right bronchus or lung: Secondary | ICD-10-CM | POA: Diagnosis not present

## 2015-08-07 DIAGNOSIS — I2510 Atherosclerotic heart disease of native coronary artery without angina pectoris: Secondary | ICD-10-CM | POA: Diagnosis not present

## 2015-08-07 DIAGNOSIS — K8020 Calculus of gallbladder without cholecystitis without obstruction: Secondary | ICD-10-CM | POA: Diagnosis not present

## 2015-08-09 DIAGNOSIS — C3431 Malignant neoplasm of lower lobe, right bronchus or lung: Secondary | ICD-10-CM | POA: Diagnosis not present

## 2015-08-09 DIAGNOSIS — C3412 Malignant neoplasm of upper lobe, left bronchus or lung: Secondary | ICD-10-CM | POA: Diagnosis not present

## 2015-08-14 ENCOUNTER — Other Ambulatory Visit: Payer: Self-pay | Admitting: Cardiology

## 2015-08-16 DIAGNOSIS — H348192 Central retinal vein occlusion, unspecified eye, stable: Secondary | ICD-10-CM | POA: Diagnosis not present

## 2015-08-16 DIAGNOSIS — H26492 Other secondary cataract, left eye: Secondary | ICD-10-CM | POA: Diagnosis not present

## 2015-08-16 DIAGNOSIS — H4710 Unspecified papilledema: Secondary | ICD-10-CM | POA: Diagnosis not present

## 2015-08-22 DIAGNOSIS — K56609 Unspecified intestinal obstruction, unspecified as to partial versus complete obstruction: Secondary | ICD-10-CM

## 2015-08-22 HISTORY — DX: Unspecified intestinal obstruction, unspecified as to partial versus complete obstruction: K56.609

## 2015-08-23 NOTE — Progress Notes (Signed)
Thoracic Location of Tumor / Histology: Enlarging left upper lung nodule that is positive on PET scan from 08/07/15.  Patient presented with enlarging left upper lung nodule on the PET scan from 08/07/15.  Biopsies revealed:  02/01/15 Diagnosis Lung, needle/core biopsy(ies), right lower lobe - INVASIVE ADENOCARCINOMA, SEE COMMENT.  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  Tobacco/Marijuana/Snuff/ETOH use: former smoker, quit in 1990, smoked for 34 years, denies marijuana, ETOH use.  Past/Anticipated interventions by cardiothoracic surgery, if any: none  Past/Anticipated interventions by medical oncology, if any: none  Signs/Symptoms  Weight changes, if any: Maintained weight since 07/24/15  Respiratory complaints, if any: No  Hemoptysis, if any: No  Pain issues, if any: No   SAFETY ISSUES:  Prior radiation? Yes 03/26/15, 03/28/15, 04/02/15 - right lower lobe nodule SBRT 54 gray  Pacemaker/ICD? No  Possible current pregnancy?no  Is the patient on methotrexate? No  Current Complaints / other details:  Dr. Hinton Rao is recommending SRS (SBRT) to the left lung nodule.  Currently having periods of diarrhea once monthly for past 3 months. To see Dr. Lyndel Safe in [place] tomorrow

## 2015-08-28 ENCOUNTER — Encounter: Payer: Self-pay | Admitting: Radiation Oncology

## 2015-08-28 ENCOUNTER — Ambulatory Visit
Admission: RE | Admit: 2015-08-28 | Discharge: 2015-08-28 | Disposition: A | Discharge status: Home / Self Care | Payer: Medicare Other | Source: Ambulatory Visit | Attending: Radiation Oncology | Admitting: Radiation Oncology

## 2015-08-28 VITALS — BP 150/59 | HR 60 | Temp 98.2°F | Ht 62.0 in | Wt 178.7 lb

## 2015-08-28 DIAGNOSIS — Z510 Encounter for antineoplastic radiation therapy: Secondary | ICD-10-CM | POA: Diagnosis not present

## 2015-08-28 DIAGNOSIS — C3412 Malignant neoplasm of upper lobe, left bronchus or lung: Secondary | ICD-10-CM | POA: Insufficient documentation

## 2015-08-28 DIAGNOSIS — C3431 Malignant neoplasm of lower lobe, right bronchus or lung: Secondary | ICD-10-CM

## 2015-08-28 NOTE — Progress Notes (Signed)
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  Ms. Natalie Porter here for reassessment S/P SBRT to her right lower lobe. She has an enlarging left upper lung nodule that is positive on PET scan for 08/07/2015. She denies coughing blood. She mentions that her appetite is not that good. She doesn't eat much during the day. Her daughter mentions that this has been going on for about a month since her diarrhea started. She states "I'm just not hungry". She has an appointment with Dr. Lyndel Safe in [place] tomorrow.  ALLERGIES:  is allergic to codeine and morphine and related.  Meds: Current Outpatient Prescriptions  Medication Sig Dispense Refill  . acetaminophen-codeine (TYLENOL #3) 300-30 MG tablet Take 1 tablet by mouth 2 (two) times daily as needed for moderate pain.   0  . amLODipine (NORVASC) 10 MG tablet Take 10 mg by mouth daily.    Marland Kitchen aspirin EC 81 MG tablet Take 81 mg by mouth daily.    Marland Kitchen atorvastatin (LIPITOR) 80 MG tablet Take 80 mg by mouth daily.    . Calcium Carbonate (CALCIUM-CARB 600 PO) Take 600 mg by mouth 2 (two) times daily.    . carvedilol (COREG) 25 MG tablet Take 25 mg by mouth 2 (two) times daily with a meal.    . chlorthalidone (HYGROTON) 50 MG tablet Take 50 mg by mouth daily.    Marland Kitchen escitalopram (LEXAPRO) 20 MG tablet Take 20 mg by mouth daily.    . fluticasone (FLONASE) 50 MCG/ACT nasal spray Place 2 sprays into both nostrils daily.    .  furosemide (LASIX) 80 MG tablet TAKE 1 TABLET DAILY 90 tablet 3  . gabapentin (NEURONTIN) 100 MG capsule Take 200-300 mg by mouth 2 (two) times daily. 200 mg in the morning and 300 mg at bedtime    . insulin glargine (LANTUS) 100 UNIT/ML injection Inject 30 Units into the skin at bedtime.     Marland Kitchen tiotropium (SPIRIVA) 18 MCG inhalation capsule Place 18 mcg into inhaler and inhale daily.    . valsartan (DIOVAN) 80 MG tablet Take 80 mg by mouth daily.     No current facility-administered medications for this encounter.    Physical Findings: The patient is in no acute distress. Patient is alert and oriented.  height is '5\' 2"'$  (1.575 m) and weight is 178 lb 11.2 oz (81.058 kg). Her temperature is 98.2 F (36.8 C). Her blood pressure is 150/59 and her pulse is 60. Her oxygen saturation is 98%. .   No palpable cervical, supraclavicular or axillary lymphoadenopathy. The heart has a regular rate and rhythm. The lungs are clear to auscultation.   Lab Findings: Lab Results  Component Value Date   WBC 7.5 01/31/2015   HGB 10.7* 01/31/2015   HCT 34.4* 01/31/2015   MCV 96.4 01/31/2015   PLT 187 01/31/2015    Radiographic Findings: No results found.  Impression: Ms. Natalie Porter is a 75 yo woman with invasive adenocarcinoma of the right lower  lobe, s/p SBRT with good control of this lesion, now an enlarging left upper lung nodule that is positive on PET scan.   Plan: She will be presented at thoracic conference to determine the best route for biopsy for the lesion. I talked to the patient about potential surgery but she does not wish to consider this. The patient would be a candidate for radiation but doubt we will be able to do SBRT given the size of the lesion but we can do a hypofractionated course of treatment (~10 treatments).  -----------------------------------  Blair Promise, PhD, MD    This document serves as a record of services personally performed by Gery Pray, MD. It was created on  his behalf by Lendon Collar, a trained medical scribe. The creation of this record is based on the scribe's personal observations and the provider's statements to them. This document has been checked and approved by the attending provider.

## 2015-08-28 NOTE — Addendum Note (Signed)
Encounter addended by: Benn Moulder, RN on: 08/28/2015  6:23 PM<BR>     Documentation filed: Charges VN

## 2015-08-29 DIAGNOSIS — C3491 Malignant neoplasm of unspecified part of right bronchus or lung: Secondary | ICD-10-CM | POA: Diagnosis not present

## 2015-08-29 DIAGNOSIS — K591 Functional diarrhea: Secondary | ICD-10-CM | POA: Diagnosis not present

## 2015-09-05 ENCOUNTER — Other Ambulatory Visit: Payer: Self-pay | Admitting: Radiation Oncology

## 2015-09-05 DIAGNOSIS — C3431 Malignant neoplasm of lower lobe, right bronchus or lung: Secondary | ICD-10-CM

## 2015-09-06 DIAGNOSIS — A09 Infectious gastroenteritis and colitis, unspecified: Secondary | ICD-10-CM | POA: Diagnosis not present

## 2015-09-12 ENCOUNTER — Other Ambulatory Visit: Payer: Self-pay | Admitting: General Surgery

## 2015-09-13 ENCOUNTER — Other Ambulatory Visit: Payer: Self-pay | Admitting: Radiology

## 2015-09-13 DIAGNOSIS — H26492 Other secondary cataract, left eye: Secondary | ICD-10-CM | POA: Diagnosis not present

## 2015-09-16 ENCOUNTER — Ambulatory Visit (HOSPITAL_COMMUNITY)
Admission: RE | Admit: 2015-09-16 | Discharge: 2015-09-16 | Disposition: A | Discharge status: Home / Self Care | Payer: Medicare Other | Source: Ambulatory Visit | Attending: Radiation Oncology | Admitting: Radiation Oncology

## 2015-09-16 ENCOUNTER — Encounter (HOSPITAL_COMMUNITY): Payer: Self-pay

## 2015-09-16 ENCOUNTER — Ambulatory Visit (HOSPITAL_COMMUNITY)
Admission: RE | Admit: 2015-09-16 | Discharge: 2015-09-16 | Disposition: A | Discharge status: Home / Self Care | Payer: Medicare Other | Source: Ambulatory Visit | Attending: Interventional Radiology | Admitting: Interventional Radiology

## 2015-09-16 DIAGNOSIS — Z9889 Other specified postprocedural states: Secondary | ICD-10-CM | POA: Diagnosis not present

## 2015-09-16 DIAGNOSIS — I2510 Atherosclerotic heart disease of native coronary artery without angina pectoris: Secondary | ICD-10-CM | POA: Diagnosis not present

## 2015-09-16 DIAGNOSIS — Z79899 Other long term (current) drug therapy: Secondary | ICD-10-CM | POA: Insufficient documentation

## 2015-09-16 DIAGNOSIS — M1990 Unspecified osteoarthritis, unspecified site: Secondary | ICD-10-CM | POA: Insufficient documentation

## 2015-09-16 DIAGNOSIS — D509 Iron deficiency anemia, unspecified: Secondary | ICD-10-CM | POA: Insufficient documentation

## 2015-09-16 DIAGNOSIS — Z794 Long term (current) use of insulin: Secondary | ICD-10-CM | POA: Diagnosis not present

## 2015-09-16 DIAGNOSIS — K219 Gastro-esophageal reflux disease without esophagitis: Secondary | ICD-10-CM | POA: Diagnosis not present

## 2015-09-16 DIAGNOSIS — G4730 Sleep apnea, unspecified: Secondary | ICD-10-CM | POA: Insufficient documentation

## 2015-09-16 DIAGNOSIS — I509 Heart failure, unspecified: Secondary | ICD-10-CM | POA: Insufficient documentation

## 2015-09-16 DIAGNOSIS — E7800 Pure hypercholesterolemia, unspecified: Secondary | ICD-10-CM | POA: Insufficient documentation

## 2015-09-16 DIAGNOSIS — I110 Hypertensive heart disease with heart failure: Secondary | ICD-10-CM | POA: Insufficient documentation

## 2015-09-16 DIAGNOSIS — E119 Type 2 diabetes mellitus without complications: Secondary | ICD-10-CM | POA: Insufficient documentation

## 2015-09-16 DIAGNOSIS — Z7982 Long term (current) use of aspirin: Secondary | ICD-10-CM | POA: Insufficient documentation

## 2015-09-16 DIAGNOSIS — C3431 Malignant neoplasm of lower lobe, right bronchus or lung: Secondary | ICD-10-CM | POA: Diagnosis not present

## 2015-09-16 DIAGNOSIS — C3412 Malignant neoplasm of upper lobe, left bronchus or lung: Secondary | ICD-10-CM | POA: Diagnosis not present

## 2015-09-16 DIAGNOSIS — J449 Chronic obstructive pulmonary disease, unspecified: Secondary | ICD-10-CM | POA: Diagnosis not present

## 2015-09-16 DIAGNOSIS — I739 Peripheral vascular disease, unspecified: Secondary | ICD-10-CM | POA: Insufficient documentation

## 2015-09-16 DIAGNOSIS — R918 Other nonspecific abnormal finding of lung field: Secondary | ICD-10-CM | POA: Diagnosis not present

## 2015-09-16 LAB — URINALYSIS, ROUTINE W REFLEX MICROSCOPIC
BILIRUBIN URINE: NEGATIVE
GLUCOSE, UA: NEGATIVE mg/dL
Hgb urine dipstick: NEGATIVE
KETONES UR: NEGATIVE mg/dL
Leukocytes, UA: NEGATIVE
NITRITE: NEGATIVE
PH: 5 (ref 5.0–8.0)
Protein, ur: NEGATIVE mg/dL
Specific Gravity, Urine: 1.011 (ref 1.005–1.030)

## 2015-09-16 LAB — CBC
HCT: 32.9 % — ABNORMAL LOW (ref 36.0–46.0)
HEMOGLOBIN: 10.4 g/dL — AB (ref 12.0–15.0)
MCH: 28.2 pg (ref 26.0–34.0)
MCHC: 31.6 g/dL (ref 30.0–36.0)
MCV: 89.2 fL (ref 78.0–100.0)
Platelets: 241 10*3/uL (ref 150–400)
RBC: 3.69 MIL/uL — ABNORMAL LOW (ref 3.87–5.11)
RDW: 14.5 % (ref 11.5–15.5)
WBC: 10.8 10*3/uL — AB (ref 4.0–10.5)

## 2015-09-16 LAB — PROTIME-INR
INR: 1.14 (ref 0.00–1.49)
PROTHROMBIN TIME: 14.8 s (ref 11.6–15.2)

## 2015-09-16 LAB — GLUCOSE, CAPILLARY: Glucose-Capillary: 130 mg/dL — ABNORMAL HIGH (ref 65–99)

## 2015-09-16 LAB — APTT: APTT: 28 s (ref 24–37)

## 2015-09-16 MED ORDER — FENTANYL CITRATE (PF) 100 MCG/2ML IJ SOLN
INTRAMUSCULAR | Status: AC | PRN
  Administered 2015-09-16: 50 ug via INTRAVENOUS

## 2015-09-16 MED ORDER — MIDAZOLAM HCL 2 MG/2ML IJ SOLN
INTRAMUSCULAR | Status: AC | PRN
  Administered 2015-09-16: 1 mg via INTRAVENOUS

## 2015-09-16 MED ORDER — SODIUM CHLORIDE 0.9 % IV SOLN
INTRAVENOUS | Status: DC
  Administered 2015-09-16: 09:00:00 via INTRAVENOUS

## 2015-09-16 MED ORDER — MIDAZOLAM HCL 2 MG/2ML IJ SOLN
INTRAMUSCULAR | Status: AC
  Filled 2015-09-16: qty 6

## 2015-09-16 MED ORDER — FENTANYL CITRATE (PF) 100 MCG/2ML IJ SOLN
INTRAMUSCULAR | Status: AC
  Filled 2015-09-16: qty 4

## 2015-09-16 NOTE — Procedures (Signed)
CT guided LUL lung bx 18g core x3 to surg path No complication No blood loss. See complete dictation in [facility].

## 2015-09-16 NOTE — Discharge Instructions (Signed)
Lung Biopsy A lung biopsy is a procedure in which a tissue sample is removed from the lung. The tissue can be examined under a microscope to help diagnose various lung disorders.  LET [facility] CARE PROVIDER KNOW ABOUT:  Any allergies you have.  All medicines you are taking, including vitamins, herbs, eye drops, creams, and over-the-counter medicines.  Previous problems you or members of your family have had with the use of anesthetics.  Any blood disorders or bleeding problems that you have.  Previous surgeries you have had.  Medical conditions you have. RISKS AND COMPLICATIONS Generally, a lung biopsy is a safe procedure. However, problems can occur and include:  Collapse of the lung.   Bleeding.   Infection.  BEFORE THE PROCEDURE  Do not eat or drink anything after midnight on the night before the procedure or as directed by your health care provider.  Ask your health care provider about changing or stopping your regular medicines. This is especially important if you are taking diabetes medicines or blood thinners.  Plan to have someone take you home after the procedure. PROCEDURE Various methods can be used to perform a lung biopsy:   Needle biopsy. A biopsy needle is inserted into the lung. The needle is used to collect the tissue sample. A CT scanner may be used to guide the needle to the right place in the lung. For this method, a medicine is used to numb the area where the biopsy sample will be taken (local anesthetic).  Bronchoscopy. A flexible tube (bronchoscope) is inserted into your lungs by going through your mouth or nose. A needle or forceps is passed through the bronchoscope to remove the tissue sample. For this method, medicine may be used to numb the back of your throat.  Open biopsy. A cut (incision) is made in your chest. The tissue sample is then removed using surgical tools. The incision is closed with skin glue, skin adhesive strips, or stitches. For  this method, you will be given medicine to make you sleep through the procedure (general anesthetic). AFTER THE PROCEDURE  Your recovery will be assessed and monitored.  You might have soreness and tenderness at the site of the biopsy for a few days after the procedure.  You might have a cough and some soreness in your throat for a few days if a bronchoscope was used.   This information is not intended to replace advice given to you by your health care provider. Make sure you discuss any questions you have with your health care provider.   Document Released: 05/28/2004 Document Revised: 03/30/2014 Document Reviewed: 08/21/2012 Elsevier Interactive Patient Education 2016 Elsevier Inc. Needle Biopsy of Lung, Care After Refer to this sheet in the next few weeks. These instructions provide you with information on caring for yourself after your procedure. Your health care provider may also give you more specific instructions. Your treatment has been planned according to current medical practices, but problems sometimes occur. Call your health care provider if you have any problems or questions after your procedure. WHAT TO EXPECT AFTER THE PROCEDURE  A bandage will be applied over the area where the needle was inserted. You may be asked to apply pressure to the bandage for several minutes to ensure there is minimal bleeding.  In most cases, you can leave when your needle biopsy procedure is completed. Do not drive yourself home. Someone else should take you home.  If you received an IV sedative or general anesthetic, you will be  taken to a comfortable place to relax while the medicine wears off.  If you have upcoming travel scheduled, talk to your health care provider about when it is safe to travel by air after the procedure. HOME CARE INSTRUCTIONS  Expect to take it easy for the rest of the day.  Protect the area where you received the needle biopsy by keeping the bandage in place for as  long as instructed.  You may feel some mild pain or discomfort in the area, but this should stop in a day or two.  Take medicines only as directed by your health care provider. SEEK MEDICAL CARE IF:   You have pain at the biopsy site that worsens or is not helped by medicine.  You have swelling or drainage at the needle biopsy site.  You have a fever. SEEK IMMEDIATE MEDICAL CARE IF:   You have new or worsening shortness of breath.  You have chest pain.  You are coughing up blood.  You have bleeding that does not stop with pressure or a bandage.  You develop light-headedness or fainting.   This information is not intended to replace advice given to you by your health care provider. Make sure you discuss any questions you have with your health care provider.   Document Released: 01/04/2007 Document Revised: 03/30/2014 Document Reviewed: 08/01/2012 Elsevier Interactive Patient Education 2016 Elsevier Inc. Moderate Conscious Sedation, Adult, Care After Refer to this sheet in the next few weeks. These instructions provide you with information on caring for yourself after your procedure. Your health care provider may also give you more specific instructions. Your treatment has been planned according to current medical practices, but problems sometimes occur. Call your health care provider if you have any problems or questions after your procedure. WHAT TO EXPECT AFTER THE PROCEDURE  After your procedure:  You may feel sleepy, clumsy, and have poor balance for several hours.  Vomiting may occur if you eat too soon after the procedure. HOME CARE INSTRUCTIONS  Do not participate in any activities where you could become injured for at least 24 hours. Do not:  Drive.  Swim.  Ride a bicycle.  Operate heavy machinery.  Cook.  Use power tools.  Climb ladders.  Work from a high place.  Do not make important decisions or sign legal documents until you are improved.  If you  vomit, drink water, juice, or soup when you can drink without vomiting. Make sure you have little or no nausea before eating solid foods.  Only take over-the-counter or prescription medicines for pain, discomfort, or fever as directed by your health care provider.  Make sure you and your family fully understand everything about the medicines given to you, including what side effects may occur.  You should not drink alcohol, take sleeping pills, or take medicines that cause drowsiness for at least 24 hours.  If you smoke, do not smoke without supervision.  If you are feeling better, you may resume normal activities 24 hours after you were sedated.  Keep all appointments with your health care provider. SEEK MEDICAL CARE IF:  Your skin is pale or bluish in color.  You continue to feel nauseous or vomit.  Your pain is getting worse and is not helped by medicine.  You have bleeding or swelling.  You are still sleepy or feeling clumsy after 24 hours. SEEK IMMEDIATE MEDICAL CARE IF:  You develop a rash.  You have difficulty breathing.  You develop any type of allergic problem.  You have a fever. MAKE SURE YOU:  Understand these instructions.  Will watch your condition.  Will get help right away if you are not doing well or get worse.   This information is not intended to replace advice given to you by your health care provider. Make sure you discuss any questions you have with your health care provider.   Document Released: 12/28/2012 Document Revised: 03/30/2014 Document Reviewed: 12/28/2012 Elsevier Interactive Patient Education [employer].

## 2015-09-16 NOTE — Progress Notes (Signed)
Patient ID: Natalie Porter, female   DOB: 11/28/1940, 75 y.o.   MRN: [id number]    Referring Physician(s): Kinard,James/McCarty,C  Supervising Physician: Arne Cleveland  Patient Status:  Outpatient  Chief Complaint:  "I'm here for another lung biopsy"  Subjective: Patient familiar to IR service from prior left upper lobe lung mass biopsy on 01/31/2015 which yielded atypical glandular epithelium with fibrosis and inflammation as well as a right lower lobe lung mass biopsy on 02/01/2015 which revealed invasive adenocarcinoma. She has since undergone radiotherapy to the right lung. Recent PET scan reveals hypermetabolic activity in the left upper lobe lung mass and she presents again today for repeat CT-guided biopsy of this left upper lobe mass. She currently denies fever, headache, abdominal pain, back pain, nausea, vomiting or abnormal bleeding. She does have occasional right-sided chest discomfort with deep inspiration, occasional dyspnea with exertion and occasional cough. She is hard of hearing. Additional hx as below.  Past Medical History  Diagnosis Date  . Diabetes mellitus without complication ([place])   . Hypertension   . Renal disorder     stage 3  . Gallstone   . Arthritis   . DVT (deep venous thrombosis) ([place])     2011  . COPD (chronic obstructive pulmonary disease) ([place])   . Pneumonia 2014  . Neuropathy ([place])   . GERD (gastroesophageal reflux disease)   . Anemia   . Hypercholesteremia   . CHF (congestive heart failure) ([facility])     Preserved EF.    . Iron deficiency anemia   . Vitamin B12 deficiency   . PVD (peripheral vascular disease) ([place])   . Osteoarthritis   . CAD (coronary artery disease)     Cath 2015, 50% calcified LAD, 60% circumflex stenosis, right coronary artery 70% stenosis medical management  . Lung cancer [facility])     New diagnosis (treated at [facility]).  Going to receive radiation  . Sleep apnea     CPAP  . Radiation 03/26/15, 03/28/15, 04/02/15   right lower lobe nodule SBRT 54 gray   Past Surgical History  Procedure Laterality Date  . Tubal ligation    . Carpal tunnel release Bilateral   . Eye surgery Bilateral     cataract  . Foot surgery Bilateral     bone spurs, and repair  . Carotid endarterectomy Right 1990  . Breast biopsy Left   . Lung biopsy      non cancerous  . Colonoscopy w/ polypectomy    . Femoral-femoral bypass graft  08/07/2013    Procedure: BYPASS GRAFT FEMORAL-FEMORAL ARTERY USING 8MM X 30CM HEMASHIELD GRAFT; LEFT ILIAC ANGIOGRAM; ATTEMPTED LEFT ILIAC ARTERY STENT GRAFT;  Surgeon: Elam Dutch, MD;  Location: [place];  Service: Vascular;;  . Application of wound vac Bilateral 08/07/2013    Procedure: APPLICATION OF INCISIONAL WOUND [place];  Surgeon: Elam Dutch, MD;  Location: [place];  Service: Vascular;  Laterality: Bilateral;  . Endarterectomy femoral Left 08/07/2013    Procedure: ENDARTERECTOMY FEMORAL;  Surgeon: Elam Dutch, MD;  Location: [place];  Service: Vascular;  Laterality: Left;  . Abdominal aortagram N/A 07/25/2013    Procedure: ABDOMINAL Maxcine Ham;  Surgeon: Serafina Mitchell, MD;  Location: [facility] CATH LAB;  Service: Cardiovascular;  Laterality: N/A;  . Left heart catheterization with coronary angiogram N/A 07/27/2013    Procedure: LEFT HEART CATHETERIZATION WITH CORONARY ANGIOGRAM;  Surgeon: Minus Breeding, MD;  Location: [facility] CATH LAB;  Service: Cardiovascular;  Laterality: N/A;      Allergies:  Codeine and Morphine and related  Medications: Prior to Admission medications   Medication Sig Start Date End Date Taking? Authorizing Provider  acetaminophen-codeine (TYLENOL #3) 300-30 MG tablet Take 1 tablet by mouth 2 (two) times daily as needed for moderate pain.  01/08/15   Historical Provider, MD  amLODipine (NORVASC) 10 MG tablet Take 10 mg by mouth daily.    Historical Provider, MD  aspirin EC 81 MG tablet Take 81 mg by mouth daily.    Historical Provider, MD  atorvastatin (LIPITOR) 80 MG tablet  Take 80 mg by mouth daily.    Historical Provider, MD  Calcium Carbonate (CALCIUM-CARB 600 PO) Take 600 mg by mouth 2 (two) times daily.    Historical Provider, MD  carvedilol (COREG) 25 MG tablet Take 25 mg by mouth 2 (two) times daily with a meal.    Historical Provider, MD  chlorthalidone (HYGROTON) 50 MG tablet Take 50 mg by mouth daily.    Historical Provider, MD  escitalopram (LEXAPRO) 20 MG tablet Take 20 mg by mouth daily.    Historical Provider, MD  fluticasone (FLONASE) 50 MCG/ACT nasal spray Place 2 sprays into both nostrils daily.    Historical Provider, MD  furosemide (LASIX) 80 MG tablet TAKE 1 TABLET DAILY 08/14/15   Minus Breeding, MD  gabapentin (NEURONTIN) 100 MG capsule Take 200-300 mg by mouth 2 (two) times daily. 200 mg in the morning and 300 mg at bedtime    Historical Provider, MD  insulin glargine (LANTUS) 100 UNIT/ML injection Inject 30 Units into the skin at bedtime.     Historical Provider, MD  tiotropium (SPIRIVA) 18 MCG inhalation capsule Place 18 mcg into inhaler and inhale daily.    Historical Provider, MD  valsartan (DIOVAN) 80 MG tablet Take 80 mg by mouth daily.    Historical Provider, MD     Vital Signs: BP 148/69 mmHg  Pulse 51  Temp(Src) 98.6 F (37 C) (Oral)  Resp 18  Ht '5\' 2"'$  (1.575 m)  Wt 178 lb 11 oz (81.052 kg)  BMI 32.67 kg/m2  SpO2 94%  Physical Exam patient awake, alert. Chest clear to auscultation bilaterally. Heart sl bradycardic but regular rhythm, positive murmur. Abdomen soft, positive bowel sounds, nontender. Lower extremities with no significant edema.  Imaging: No results found.  Labs:  CBC:  Recent Labs  01/31/15 0907 09/16/15 0925  WBC 7.5 10.8*  HGB 10.7* 10.4*  HCT 34.4* 32.9*  PLT 187 241    COAGS:  Recent Labs  01/31/15 0907  INR 1.11  APTT 28    BMP:  Recent Labs  01/31/15 0907 02/28/15 1017 03/12/15 1304  NA 136 143 135  K 5.8* 4.8 5.3  CL 111 107 105  CO2 18* 23 23  GLUCOSE 121* 134* 204*    BUN 28* 27* 39*  CALCIUM 8.7* 9.5 8.9  CREATININE 1.42* 1.39* 1.41*  GFRNONAA 35*  --   --   GFRAA 41*  --   --     LIVER FUNCTION TESTS: No results for input(s): BILITOT, AST, ALT, ALKPHOS, PROT, ALBUMIN in the last 8760 hours.  Assessment and Plan: Patient with history of invasive adenocarcinoma of the right lung 2016 (prior radiotherapy) and known left upper lobe mass as well which in the past revealed atypical glandular epithelium and inflammation. Recent PET scan demonstrates hypermetabolic activity in the left upper lobe mass. She presents again today for CT-guided left upper lobe lung mass biopsy. Details/risks of procedure, including not limited to, internal bleeding,  infection, pneumothorax requiring chest tube placement and death discussed with patient/daughter with their understanding and consent.   Electronically Signed: D. Rowe Robert 09/16/2015, 9:39 AM   I spent a total of 20 minutes at the the patient's bedside AND on the patient's hospital floor or unit, greater than 50% of which was counseling/coordinating care for CT guided left lung mass biopsy

## 2015-09-17 LAB — URINE CULTURE

## 2015-09-24 DIAGNOSIS — K8020 Calculus of gallbladder without cholecystitis without obstruction: Secondary | ICD-10-CM | POA: Diagnosis not present

## 2015-09-24 DIAGNOSIS — R109 Unspecified abdominal pain: Secondary | ICD-10-CM | POA: Diagnosis not present

## 2015-09-24 DIAGNOSIS — N3000 Acute cystitis without hematuria: Secondary | ICD-10-CM | POA: Diagnosis not present

## 2015-09-24 DIAGNOSIS — N390 Urinary tract infection, site not specified: Secondary | ICD-10-CM | POA: Diagnosis not present

## 2015-09-27 ENCOUNTER — Telehealth: Payer: Self-pay | Admitting: Cardiology

## 2015-09-27 ENCOUNTER — Encounter (HOSPITAL_COMMUNITY): Payer: Self-pay | Admitting: Physical Medicine and Rehabilitation

## 2015-09-27 ENCOUNTER — Inpatient Hospital Stay (HOSPITAL_COMMUNITY)
Admission: EM | Admit: 2015-09-27 | Discharge: 2015-10-01 | DRG: 190 | Disposition: A | Discharge status: Home / Self Care | Payer: Medicare Other | Attending: Family Medicine | Admitting: Family Medicine

## 2015-09-27 ENCOUNTER — Emergency Department (HOSPITAL_COMMUNITY): Payer: Medicare Other

## 2015-09-27 DIAGNOSIS — E1151 Type 2 diabetes mellitus with diabetic peripheral angiopathy without gangrene: Secondary | ICD-10-CM | POA: Diagnosis present

## 2015-09-27 DIAGNOSIS — J440 Chronic obstructive pulmonary disease with acute lower respiratory infection: Secondary | ICD-10-CM | POA: Diagnosis not present

## 2015-09-27 DIAGNOSIS — N390 Urinary tract infection, site not specified: Secondary | ICD-10-CM | POA: Diagnosis not present

## 2015-09-27 DIAGNOSIS — I2510 Atherosclerotic heart disease of native coronary artery without angina pectoris: Secondary | ICD-10-CM | POA: Diagnosis not present

## 2015-09-27 DIAGNOSIS — E7800 Pure hypercholesterolemia, unspecified: Secondary | ICD-10-CM | POA: Diagnosis present

## 2015-09-27 DIAGNOSIS — J189 Pneumonia, unspecified organism: Secondary | ICD-10-CM | POA: Diagnosis present

## 2015-09-27 DIAGNOSIS — G4733 Obstructive sleep apnea (adult) (pediatric): Secondary | ICD-10-CM | POA: Diagnosis present

## 2015-09-27 DIAGNOSIS — Z86718 Personal history of other venous thrombosis and embolism: Secondary | ICD-10-CM

## 2015-09-27 DIAGNOSIS — R0602 Shortness of breath: Secondary | ICD-10-CM | POA: Diagnosis present

## 2015-09-27 DIAGNOSIS — I5033 Acute on chronic diastolic (congestive) heart failure: Secondary | ICD-10-CM | POA: Diagnosis not present

## 2015-09-27 DIAGNOSIS — C3492 Malignant neoplasm of unspecified part of left bronchus or lung: Secondary | ICD-10-CM | POA: Diagnosis present

## 2015-09-27 DIAGNOSIS — R0600 Dyspnea, unspecified: Secondary | ICD-10-CM | POA: Diagnosis present

## 2015-09-27 DIAGNOSIS — N183 Chronic kidney disease, stage 3 (moderate): Secondary | ICD-10-CM | POA: Diagnosis not present

## 2015-09-27 DIAGNOSIS — M069 Rheumatoid arthritis, unspecified: Secondary | ICD-10-CM | POA: Diagnosis present

## 2015-09-27 DIAGNOSIS — I130 Hypertensive heart and chronic kidney disease with heart failure and stage 1 through stage 4 chronic kidney disease, or unspecified chronic kidney disease: Secondary | ICD-10-CM | POA: Diagnosis not present

## 2015-09-27 DIAGNOSIS — Z87891 Personal history of nicotine dependence: Secondary | ICD-10-CM

## 2015-09-27 DIAGNOSIS — E875 Hyperkalemia: Secondary | ICD-10-CM | POA: Diagnosis present

## 2015-09-27 DIAGNOSIS — R918 Other nonspecific abnormal finding of lung field: Secondary | ICD-10-CM | POA: Diagnosis not present

## 2015-09-27 DIAGNOSIS — E1122 Type 2 diabetes mellitus with diabetic chronic kidney disease: Secondary | ICD-10-CM | POA: Diagnosis present

## 2015-09-27 DIAGNOSIS — Z7982 Long term (current) use of aspirin: Secondary | ICD-10-CM

## 2015-09-27 DIAGNOSIS — Z79899 Other long term (current) drug therapy: Secondary | ICD-10-CM

## 2015-09-27 DIAGNOSIS — I5032 Chronic diastolic (congestive) heart failure: Secondary | ICD-10-CM | POA: Diagnosis not present

## 2015-09-27 DIAGNOSIS — D509 Iron deficiency anemia, unspecified: Secondary | ICD-10-CM | POA: Diagnosis present

## 2015-09-27 DIAGNOSIS — N179 Acute kidney failure, unspecified: Secondary | ICD-10-CM | POA: Diagnosis present

## 2015-09-27 DIAGNOSIS — K5900 Constipation, unspecified: Secondary | ICD-10-CM | POA: Diagnosis present

## 2015-09-27 DIAGNOSIS — K219 Gastro-esophageal reflux disease without esophagitis: Secondary | ICD-10-CM | POA: Diagnosis present

## 2015-09-27 DIAGNOSIS — Z09 Encounter for follow-up examination after completed treatment for conditions other than malignant neoplasm: Secondary | ICD-10-CM | POA: Diagnosis not present

## 2015-09-27 DIAGNOSIS — Z9842 Cataract extraction status, left eye: Secondary | ICD-10-CM

## 2015-09-27 DIAGNOSIS — Z9841 Cataract extraction status, right eye: Secondary | ICD-10-CM

## 2015-09-27 DIAGNOSIS — F329 Major depressive disorder, single episode, unspecified: Secondary | ICD-10-CM | POA: Diagnosis present

## 2015-09-27 DIAGNOSIS — Z794 Long term (current) use of insulin: Secondary | ICD-10-CM

## 2015-09-27 DIAGNOSIS — E1121 Type 2 diabetes mellitus with diabetic nephropathy: Secondary | ICD-10-CM | POA: Diagnosis present

## 2015-09-27 HISTORY — DX: Major depressive disorder, single episode, unspecified: F32.9

## 2015-09-27 HISTORY — DX: Unspecified intestinal obstruction, unspecified as to partial versus complete obstruction: K56.609

## 2015-09-27 HISTORY — DX: Transient cerebral ischemic attack, unspecified: G45.9

## 2015-09-27 HISTORY — DX: Depression, unspecified: F32.A

## 2015-09-27 HISTORY — DX: Personal history of urinary (tract) infections: Z87.440

## 2015-09-27 LAB — I-STAT TROPONIN, ED
Troponin i, poc: 0.04 ng/mL (ref 0.00–0.08)
Troponin i, poc: 0.03 ng/mL (ref 0.00–0.08)

## 2015-09-27 LAB — URINALYSIS, ROUTINE W REFLEX MICROSCOPIC
Bilirubin Urine: NEGATIVE
Glucose, UA: NEGATIVE mg/dL
Hgb urine dipstick: NEGATIVE
KETONES UR: NEGATIVE mg/dL
LEUKOCYTES UA: NEGATIVE
NITRITE: NEGATIVE
PROTEIN: NEGATIVE mg/dL
Specific Gravity, Urine: 1.021 (ref 1.005–1.030)
pH: 5 (ref 5.0–8.0)

## 2015-09-27 LAB — CBC WITH DIFFERENTIAL/PLATELET
BASOS ABS: 0 10*3/uL (ref 0.0–0.1)
BASOS PCT: 0 %
EOS PCT: 2 %
Eosinophils Absolute: 0.3 10*3/uL (ref 0.0–0.7)
HCT: 31.4 % — ABNORMAL LOW (ref 36.0–46.0)
Hemoglobin: 9.9 g/dL — ABNORMAL LOW (ref 12.0–15.0)
Lymphocytes Relative: 8 %
Lymphs Abs: 1.1 10*3/uL (ref 0.7–4.0)
MCH: 28.5 pg (ref 26.0–34.0)
MCHC: 31.5 g/dL (ref 30.0–36.0)
MCV: 90.5 fL (ref 78.0–100.0)
MONO ABS: 1.4 10*3/uL — AB (ref 0.1–1.0)
MONOS PCT: 10 %
Neutro Abs: 11.2 10*3/uL — ABNORMAL HIGH (ref 1.7–7.7)
Neutrophils Relative %: 80 %
PLATELETS: 171 10*3/uL (ref 150–400)
RBC: 3.47 MIL/uL — ABNORMAL LOW (ref 3.87–5.11)
RDW: 14.3 % (ref 11.5–15.5)
WBC: 14 10*3/uL — ABNORMAL HIGH (ref 4.0–10.5)

## 2015-09-27 LAB — COMPREHENSIVE METABOLIC PANEL
ALBUMIN: 2.4 g/dL — AB (ref 3.5–5.0)
ALT: 12 U/L — ABNORMAL LOW (ref 14–54)
ANION GAP: 7 (ref 5–15)
AST: 15 U/L (ref 15–41)
Alkaline Phosphatase: 44 U/L (ref 38–126)
BUN: 55 mg/dL — AB (ref 6–20)
CHLORIDE: 104 mmol/L (ref 101–111)
CO2: 20 mmol/L — ABNORMAL LOW (ref 22–32)
Calcium: 8.3 mg/dL — ABNORMAL LOW (ref 8.9–10.3)
Creatinine, Ser: 2.39 mg/dL — ABNORMAL HIGH (ref 0.44–1.00)
GFR calc Af Amer: 22 mL/min — ABNORMAL LOW (ref >60)
GFR, EST NON AFRICAN AMERICAN: 19 mL/min — AB (ref >60)
Glucose, Bld: 143 mg/dL — ABNORMAL HIGH (ref 65–99)
POTASSIUM: 6 mmol/L — AB (ref 3.5–5.1)
Sodium: 131 mmol/L — ABNORMAL LOW (ref 135–145)
TOTAL PROTEIN: 7.6 g/dL (ref 6.5–8.1)
Total Bilirubin: 0.3 mg/dL (ref 0.3–1.2)

## 2015-09-27 LAB — MAGNESIUM: MAGNESIUM: 2.5 mg/dL — AB (ref 1.7–2.4)

## 2015-09-27 LAB — APTT: aPTT: 28 seconds (ref 24–37)

## 2015-09-27 LAB — PROTIME-INR
INR: 1.14 (ref 0.00–1.49)
Prothrombin Time: 14.8 seconds (ref 11.6–15.2)

## 2015-09-27 LAB — BRAIN NATRIURETIC PEPTIDE: B NATRIURETIC PEPTIDE 5: 570 pg/mL — AB (ref 0.0–100.0)

## 2015-09-27 LAB — GLUCOSE, CAPILLARY
GLUCOSE-CAPILLARY: 156 mg/dL — AB (ref 65–99)
GLUCOSE-CAPILLARY: 194 mg/dL — AB (ref 65–99)

## 2015-09-27 MED ORDER — DOCUSATE SODIUM 100 MG PO CAPS
100.0000 mg | ORAL_CAPSULE | Freq: Two times a day (BID) | ORAL | Status: DC
  Administered 2015-09-27 – 2015-10-01 (×8): 100 mg via ORAL
  Filled 2015-09-27 (×8): qty 1

## 2015-09-27 MED ORDER — GABAPENTIN 100 MG PO CAPS
200.0000 mg | ORAL_CAPSULE | Freq: Every day | ORAL | Status: DC
  Administered 2015-09-28 – 2015-10-01 (×4): 200 mg via ORAL
  Filled 2015-09-27 (×4): qty 2

## 2015-09-27 MED ORDER — ASPIRIN EC 81 MG PO TBEC
81.0000 mg | DELAYED_RELEASE_TABLET | Freq: Every day | ORAL | Status: DC
  Administered 2015-09-28 – 2015-10-01 (×4): 81 mg via ORAL
  Filled 2015-09-27 (×4): qty 1

## 2015-09-27 MED ORDER — IRBESARTAN 75 MG PO TABS
75.0000 mg | ORAL_TABLET | Freq: Every day | ORAL | Status: DC
  Administered 2015-09-28 – 2015-09-30 (×3): 75 mg via ORAL
  Filled 2015-09-27 (×3): qty 1

## 2015-09-27 MED ORDER — DEXTROSE 5 % IV SOLN
2.0000 g | Freq: Once | INTRAVENOUS | Status: AC
  Administered 2015-09-27: 2 g via INTRAVENOUS
  Filled 2015-09-27: qty 2

## 2015-09-27 MED ORDER — TIOTROPIUM BROMIDE MONOHYDRATE 18 MCG IN CAPS
18.0000 ug | ORAL_CAPSULE | Freq: Every day | RESPIRATORY_TRACT | Status: DC
  Administered 2015-09-28 – 2015-10-01 (×4): 18 ug via RESPIRATORY_TRACT
  Filled 2015-09-27: qty 5

## 2015-09-27 MED ORDER — VANCOMYCIN HCL IN DEXTROSE 1-5 GM/200ML-% IV SOLN: 1000.0000 mg | INTRAVENOUS | Status: DC

## 2015-09-27 MED ORDER — INSULIN ASPART 100 UNIT/ML ~~LOC~~ SOLN
0.0000 [IU] | Freq: Three times a day (TID) | SUBCUTANEOUS | Status: DC
  Administered 2015-09-27: 2 [IU] via SUBCUTANEOUS
  Administered 2015-09-28: 1 [IU] via SUBCUTANEOUS
  Administered 2015-09-28: 5 [IU] via SUBCUTANEOUS
  Administered 2015-09-28: 2 [IU] via SUBCUTANEOUS
  Administered 2015-09-29: 7 [IU] via SUBCUTANEOUS
  Administered 2015-09-29: 5 [IU] via SUBCUTANEOUS

## 2015-09-27 MED ORDER — HEPARIN SODIUM (PORCINE) 5000 UNIT/ML IJ SOLN
5000.0000 [IU] | Freq: Three times a day (TID) | INTRAMUSCULAR | Status: DC
  Administered 2015-09-27 – 2015-10-01 (×10): 5000 [IU] via SUBCUTANEOUS
  Filled 2015-09-27 (×10): qty 1

## 2015-09-27 MED ORDER — INSULIN ASPART 100 UNIT/ML ~~LOC~~ SOLN: 3.0000 [IU] | Freq: Three times a day (TID) | SUBCUTANEOUS | Status: DC

## 2015-09-27 MED ORDER — FUROSEMIDE 10 MG/ML IJ SOLN
80.0000 mg | Freq: Every day | INTRAMUSCULAR | Status: DC
Start: 2015-09-27 — End: 2015-09-29
  Administered 2015-09-27 – 2015-09-29 (×3): 80 mg via INTRAVENOUS
  Filled 2015-09-27 (×3): qty 8

## 2015-09-27 MED ORDER — INSULIN GLARGINE 100 UNIT/ML ~~LOC~~ SOLN
15.0000 [IU] | Freq: Every day | SUBCUTANEOUS | Status: DC
  Administered 2015-09-27 – 2015-09-28 (×2): 15 [IU] via SUBCUTANEOUS
  Filled 2015-09-27 (×3): qty 0.15

## 2015-09-27 MED ORDER — VANCOMYCIN HCL IN DEXTROSE 1-5 GM/200ML-% IV SOLN
1000.0000 mg | Freq: Once | INTRAVENOUS | Status: AC
  Administered 2015-09-27: 1000 mg via INTRAVENOUS
  Filled 2015-09-27: qty 200

## 2015-09-27 MED ORDER — AMLODIPINE BESYLATE 10 MG PO TABS
10.0000 mg | ORAL_TABLET | Freq: Every day | ORAL | Status: DC
  Administered 2015-09-28 – 2015-10-01 (×4): 10 mg via ORAL
  Filled 2015-09-27 (×4): qty 1

## 2015-09-27 MED ORDER — INSULIN ASPART 100 UNIT/ML ~~LOC~~ SOLN
0.0000 [IU] | Freq: Every day | SUBCUTANEOUS | Status: DC
  Administered 2015-09-28: 3 [IU] via SUBCUTANEOUS
  Administered 2015-09-30: 4 [IU] via SUBCUTANEOUS

## 2015-09-27 MED ORDER — GABAPENTIN 300 MG PO CAPS
300.0000 mg | ORAL_CAPSULE | Freq: Every day | ORAL | Status: DC
  Administered 2015-09-27 – 2015-09-30 (×4): 300 mg via ORAL
  Filled 2015-09-27 (×4): qty 1

## 2015-09-27 MED ORDER — ATORVASTATIN CALCIUM 80 MG PO TABS
80.0000 mg | ORAL_TABLET | Freq: Every day | ORAL | Status: DC
  Administered 2015-09-27 – 2015-09-30 (×4): 80 mg via ORAL
  Filled 2015-09-27 (×2): qty 1
  Filled 2015-09-27: qty 2
  Filled 2015-09-27: qty 1

## 2015-09-27 MED ORDER — CIPROFLOXACIN HCL 500 MG PO TABS
500.0000 mg | ORAL_TABLET | Freq: Every day | ORAL | Status: DC
  Administered 2015-09-28: 500 mg via ORAL
  Filled 2015-09-27: qty 1

## 2015-09-27 MED ORDER — ESCITALOPRAM OXALATE 10 MG PO TABS
20.0000 mg | ORAL_TABLET | Freq: Every day | ORAL | Status: DC
  Administered 2015-09-28 – 2015-10-01 (×4): 20 mg via ORAL
  Filled 2015-09-27 (×4): qty 2

## 2015-09-27 MED ORDER — PANTOPRAZOLE SODIUM 40 MG PO TBEC
40.0000 mg | DELAYED_RELEASE_TABLET | Freq: Every day | ORAL | Status: DC
  Administered 2015-09-28 – 2015-10-01 (×4): 40 mg via ORAL
  Filled 2015-09-27 (×4): qty 1

## 2015-09-27 MED ORDER — CARVEDILOL 25 MG PO TABS
25.0000 mg | ORAL_TABLET | Freq: Two times a day (BID) | ORAL | Status: DC
  Administered 2015-09-27 – 2015-09-30 (×7): 25 mg via ORAL
  Filled 2015-09-27 (×7): qty 1

## 2015-09-27 MED ORDER — SODIUM CHLORIDE 0.9 % IV BOLUS (SEPSIS)
500.0000 mL | Freq: Once | INTRAVENOUS | Status: AC
  Administered 2015-09-27: 500 mL via INTRAVENOUS

## 2015-09-27 NOTE — Progress Notes (Signed)
Pharmacy Antibiotic Note  Natalie Porter is a 75 y.o. female with known lung cancer, CHF, and renal insufficiency admitted on 09/27/2015 with pneumonia.  Pharmacy has been consulted for vancomycin dosing. Pt has also received cefepime 2 g x 1 dose in the ED.  Pt reports a weight gain of 10 lbs over the past few days, making her normal weight between 70-80 kg. Renal fxn is poor with SCr 2.39 and CrCl 20.4 mL/min. Pt borderline for dosing given weight and renal fxn, will plan to be more conservative with dosing and f/u renal fxn.   Plan: --Vancomycin 1000 mg IV x1, followed by 1000 mg IV every 48h hours.   --Goal trough 15-20 mcg/mL. --Cefepime 2 g IV x 1  --F/U renal function, cultures, clinical improvement, VT at Css  Height: '5\' 2"'$  (157.5 cm) Weight: 185 lb (83.915 kg) IBW/kg (Calculated) : 50.1  Temp (24hrs), Avg:98.5 F (36.9 C), Min:98.5 F (36.9 C), Max:98.5 F (36.9 C)   Recent Labs Lab 09/27/15 1116  WBC 14.0*  CREATININE 2.39*    Estimated Creatinine Clearance: 20.4 mL/min (by C-G formula based on Cr of 2.39).    Allergies  Allergen Reactions  . Codeine Nausea Only  . Morphine And Related Nausea And Vomiting    Antimicrobials this admission: 7/7 cefepime x1 7/7 vanc >>   Dose adjustments this admission: n/a  Microbiology results: Pending  Thank you for allowing pharmacy to be a part of this patient's care.  Governor Specking, PharmD Clinical Pharmacist Pager: [phone] 09/27/2015 1:36 PM

## 2015-09-27 NOTE — Progress Notes (Signed)
Family Medicine Teaching Service Daily Progress Note Intern Pager: [phone]  Patient name: Natalie Porter Medical record number: [id number] Date of birth: Aug 03, 1940 Age: 75 y.o. Gender: female  Primary Care Provider: Cyndy Freeze, MD Consultants: None Code Status: FULL  Pt Overview and Major Events to Date:  7/7 - admitted to [place]; received one dose vanc/cefepime  Assessment and Plan: Natalie Porter is a 75 y.o. female presenting with shortness of breath. PMH is significant for HFpEF, T2DM, stage 3 CKD, COPD, GERD, PVD, CAD, HLD, OSA, anemia, lung CA, osteoarthritis.   Shortness of breath. Likely CHF exacerbation vs HCAP. Presented with hypoxia to 88% on RA and WBC 14.4 but afebrile on presentation. Has had subjective fevers. Received vanc/cefepime and IVF in ED. More likely CHF exacerbation because patient has orthopnea, 10lb wt gain, BNP 570 with JVD, rhonchi on lung exam with trace LE edema. Unlikely PE, Wells score 1. CXR infiltrate versus atelectasis and known underlying lung nodule at R lung base with small R pleural effusion. Weight down 181lb from 182lb on admission. Sats in low 90s on 2L [place] overnight, however patient uncomfortable this AM so increased to 3L. UOP 900cc since admission.  - Cont IV Lasix '80mg'$  qd - Monitor respiratory status - Strict I/Os, daily weights  - Consider PE work-up if no improvement with diuresis - Being albuterol nebs q4 scheduled. If no improvement with diuresis and nebs by mid afternoon, begin tx for possible PNA.   AKI in setting of Stage 3 CKD. Cr on admission 2.39, baseline 1.4. Likely cardiorenal d/t signs of volume overload. Unlikely prerenal AKI.  Cr increased to 2.4 this AM.  - Diuresis as above - Continue to monitor  Chest pain. Likely musculoskeletal vs 2/2 cough because TTP to palpation of chest wall and resolves with lying flat. Unlikely ACS or PE (trop neg x2 and Wells score 1). Persistent but only when coughing this AM. -  Continue to monitor - Tylenol PRN pain control  UTI. Patient had urinary sx and received 2 days of ciprofloxacin prior to admission with improvement. Denying dysuria or increased urinary frequency.  - Continue ciprofloxacin for a total of 5 days, last dose on 7/9   Constipation. Patient has had constipation and bloating, was given enema at hospital at [facility] two days prior to presentation with some relief but states has continued discomfort. Abdomen with no TTP this AM.  - Docusate - Add Senna if no BM  T2DM. On lantus 30U at home. Last a1c 7.0 (07/22/1998). CBG 144 overnight.  - Monitor CBGs ACHS - Lantus 15U and SSI - A1C pending  HTN. BP 169/54 on admission. 158/58 overnight.  - Continue home meds norvasc, coreg, ARB - Monitor BP  COPD. Does not currently appear to be in exacerbation, no sputum production. Patient has chronic cough that has been worked up as outpatient and states is not changed from her baseline - Continue home Spiriva  HLD.  - Continue statin  PVD.  - Continue gabapentin  GERD. - Protonix  OSA. - CPAP at night  H/o depression. - Continue home lexapro  FEN/GI: SLIV, heart healthy diet, Protonix Prophylaxis: heparin  Disposition: pending medical improvement  Subjective:  Patient reporting worsening dyspnea this AM. Reports having to urinate frequently overnight, but says abdominal pain has improved. Cough persistent but not worsening.   Objective: Temp:  [98.5 F (36.9 C)-99.9 F (37.7 C)] 99.2 F (37.3 C) (07/08 0527) Pulse Rate:  [51-58] 57 (07/08 0527) Resp:  [20] 20 (  07/08 0527) BP: (149-169)/(50-58) 158/58 mmHg (07/08 0527) SpO2:  [88 %-98 %] 93 % (07/08 0527) FiO2 (%):  [96 %] 96 % (07/07 1055) Weight:  [181 lb 3.2 oz (82.192 kg)-185 lb (83.915 kg)] 181 lb 3.2 oz (82.192 kg) (07/08 0527) Physical Exam: General: lying in bed in NAD Cardiovascular: RRR, no murmurs appreciated Respiratory: audible wheezing while speaking to  patient, prominent expiratory wheezes bilaterally in auscultation; 3L [place] in place; difficulty speaking in full sentences due to SOB Abdomen: mildly distended, non-tender, soft, +BS, no guarding or rebound Extremities: trace to 1+ pitting edema to mid shin bilaterally  Laboratory:  Recent Labs Lab 09/27/15 1116 09/28/15 0326  WBC 14.0* 19.4*  HGB 9.9* 8.9*  HCT 31.4* 28.3*  PLT 171 174    Recent Labs Lab 09/27/15 1116 09/28/15 0326  NA 131* 131*  K 6.0* 5.6*  CL 104 104  CO2 20* 20*  BUN 55* 59*  CREATININE 2.39* 2.42*  CALCIUM 8.3* 8.2*  PROT 7.6  --   BILITOT 0.3  --   ALKPHOS 44  --   ALT 12*  --   AST 15  --   GLUCOSE 143* 126*    Imaging/Diagnostic Tests: Dg Chest 2 View  09/27/2015  CLINICAL DATA:  Shortness of breath, history hypertension, diabetes mellitus, lung cancer EXAM: CHEST  2 VIEW COMPARISON:  09/16/2015 ; correlation PET-CT 08/07/2015, CT chest 07/29/2015 FINDINGS: Enlargement of cardiac silhouette. Atherosclerotic calcification aorta. Stable mediastinal contours. Large soft tissue opacity/mass LEFT upper lobe compatible with tumor. Bronchitic changes with mild atelectasis at RIGHT base. Additional opacity at the RIGHT base is somewhat more focal; this corresponds with area of infiltrate and underlying non nodule at RIGHT base by prior imaging. Remaining lungs clear. Small RIGHT pleural effusion. No pneumothorax. Bones demineralized. IMPRESSION: Large LEFT upper lobe mass. Infiltrate versus atelectasis and known underlying lung nodule at RIGHT lung base with small RIGHT pleural effusion. Enlargement of cardiac silhouette. Aortic atherosclerosis. Electronically Signed   By: Lavonia Dana M.D.   On: 09/27/2015 12:23    Verner Mould, MD 09/28/2015, 8:51 AM PGY-2, [place] Intern pager: [phone], text pages welcome

## 2015-09-27 NOTE — Progress Notes (Signed)
CPAP machine unavailable at this time. RT will set one up for patient when one becomes available.

## 2015-09-27 NOTE — Telephone Encounter (Signed)
New message  Pt daughter call requesting to speak with RN. Pt daughter states pt is up ten pounds and would like further instructions. Please call back to discuss

## 2015-09-27 NOTE — Progress Notes (Signed)
Spoke to MD regarding order for foley to be placed. Urine sent down, pt temp elevated 99.1 upon arrival to floor. Patient is alert and oriented and able to stand to [facility]. Pt daughter also reported that pt just recently went to the doctors office and had an UTI.  MD said it was ok to not place foley at this time. Will continue to monitor patient.

## 2015-09-27 NOTE — ED Provider Notes (Addendum)
CSN: [id number]     Arrival date & time 09/27/15  1024 History   First MD Initiated Contact with Patient 09/27/15 1050     Chief Complaint  Patient presents with  . Shortness of Breath   HPI Patient presents to the emergency room for evaluation of shortness of breath and chest discomfort. Patient has a history of congestive heart failure. She is noted over the last few days that she's had worsening shortness of breath. She normally sleeps in her bed with CPAP. The last few nights she has not been able to lie flat so she has been sitting up in a chair.  She has had an increased weight gain, 10 pounds. Patient was recently diagnosed with urinary tract infection so she had increased her fluid intake. She's had some intermittent bilateral chest pain.  She denies any vomiting or diarrhea. Past Medical History  Diagnosis Date  . Diabetes mellitus without complication ([place])   . Hypertension   . Renal disorder     stage 3  . Gallstone   . Arthritis   . DVT (deep venous thrombosis) ([place])     2011  . COPD (chronic obstructive pulmonary disease) ([place])   . Pneumonia 2014  . Neuropathy ([place])   . GERD (gastroesophageal reflux disease)   . Anemia   . Hypercholesteremia   . CHF (congestive heart failure) ([facility])     Preserved EF.    . Iron deficiency anemia   . Vitamin B12 deficiency   . PVD (peripheral vascular disease) ([place])   . Osteoarthritis   . CAD (coronary artery disease)     Cath 2015, 50% calcified LAD, 60% circumflex stenosis, right coronary artery 70% stenosis medical management  . Lung cancer [facility])     New diagnosis (treated at [facility]).  Going to receive radiation  . Sleep apnea     CPAP  . Radiation 03/26/15, 03/28/15, 04/02/15    right lower lobe nodule SBRT 54 gray   Past Surgical History  Procedure Laterality Date  . Tubal ligation    . Carpal tunnel release Bilateral   . Eye surgery Bilateral     cataract  . Foot surgery Bilateral     bone spurs, and repair  . Carotid  endarterectomy Right 1990  . Breast biopsy Left   . Lung biopsy      non cancerous  . Colonoscopy w/ polypectomy    . Femoral-femoral bypass graft  08/07/2013    Procedure: BYPASS GRAFT FEMORAL-FEMORAL ARTERY USING 8MM X 30CM HEMASHIELD GRAFT; LEFT ILIAC ANGIOGRAM; ATTEMPTED LEFT ILIAC ARTERY STENT GRAFT;  Surgeon: Elam Dutch, MD;  Location: [place];  Service: Vascular;;  . Application of wound vac Bilateral 08/07/2013    Procedure: APPLICATION OF INCISIONAL WOUND [place];  Surgeon: Elam Dutch, MD;  Location: [place];  Service: Vascular;  Laterality: Bilateral;  . Endarterectomy femoral Left 08/07/2013    Procedure: ENDARTERECTOMY FEMORAL;  Surgeon: Elam Dutch, MD;  Location: [place];  Service: Vascular;  Laterality: Left;  . Abdominal aortagram N/A 07/25/2013    Procedure: ABDOMINAL Maxcine Ham;  Surgeon: Serafina Mitchell, MD;  Location: [facility] CATH LAB;  Service: Cardiovascular;  Laterality: N/A;  . Left heart catheterization with coronary angiogram N/A 07/27/2013    Procedure: LEFT HEART CATHETERIZATION WITH CORONARY ANGIOGRAM;  Surgeon: Minus Breeding, MD;  Location: [facility] CATH LAB;  Service: Cardiovascular;  Laterality: N/A;   Family History  Problem Relation Age of Onset  . Heart attack Mother 40  .  Stroke Father   . Heart attack Father 56  . Cancer Sister 25    Uterian  . Cancer Sister     Lung   Social History  Substance Use Topics  . Smoking status: Former Smoker -- 1.00 packs/day for 34 years    Types: Cigarettes    Quit date: 03/28/1988  . Smokeless tobacco: Never Used  . Alcohol Use: No   OB History    No data available     Review of Systems  All other systems reviewed and are negative.     Allergies  Codeine and Morphine and related  Home Medications   Prior to Admission medications   Medication Sig Start Date End Date Taking? Authorizing Provider  acetaminophen-codeine (TYLENOL #3) 300-30 MG tablet Take 1 tablet by mouth 2 (two) times daily as needed for  moderate pain.  01/08/15   Historical Provider, MD  amLODipine (NORVASC) 10 MG tablet Take 10 mg by mouth daily.    Historical Provider, MD  aspirin EC 81 MG tablet Take 81 mg by mouth daily.    Historical Provider, MD  atorvastatin (LIPITOR) 80 MG tablet Take 80 mg by mouth daily.    Historical Provider, MD  Calcium Carbonate (CALCIUM-CARB 600 PO) Take 600 mg by mouth 2 (two) times daily.    Historical Provider, MD  carvedilol (COREG) 25 MG tablet Take 25 mg by mouth 2 (two) times daily with a meal.    Historical Provider, MD  chlorthalidone (HYGROTON) 50 MG tablet Take 50 mg by mouth daily.    Historical Provider, MD  escitalopram (LEXAPRO) 20 MG tablet Take 20 mg by mouth daily.    Historical Provider, MD  fluticasone (FLONASE) 50 MCG/ACT nasal spray Place 2 sprays into both nostrils daily.    Historical Provider, MD  furosemide (LASIX) 80 MG tablet TAKE 1 TABLET DAILY 08/14/15   Minus Breeding, MD  gabapentin (NEURONTIN) 100 MG capsule Take 200-300 mg by mouth 2 (two) times daily. 200 mg in the morning and 300 mg at bedtime    Historical Provider, MD  insulin glargine (LANTUS) 100 UNIT/ML injection Inject 30 Units into the skin at bedtime.     Historical Provider, MD  tiotropium (SPIRIVA) 18 MCG inhalation capsule Place 18 mcg into inhaler and inhale daily.    Historical Provider, MD  valsartan (DIOVAN) 80 MG tablet Take 80 mg by mouth daily.    Historical Provider, MD   BP 169/54 mmHg  Pulse 55  Temp(Src) 98.5 F (36.9 C) (Oral)  Resp 20  Ht '5\' 2"'$  (1.575 m)  Wt 83.915 kg  BMI 33.83 kg/m2  SpO2 94% Physical Exam  Constitutional: No distress.  HENT:  Head: Normocephalic and atraumatic.  Right Ear: External ear normal.  Left Ear: External ear normal.  Eyes: Conjunctivae are normal. Right eye exhibits no discharge. Left eye exhibits no discharge. No scleral icterus.  Neck: Neck supple. No tracheal deviation present.  Cardiovascular: Normal rate, regular rhythm and intact distal  pulses.   Pulmonary/Chest: Effort normal and breath sounds normal. No stridor. No respiratory distress. She has no wheezes. She has no rales.  Abdominal: Soft. Bowel sounds are normal. She exhibits no distension. There is no tenderness. There is no rebound and no guarding.  Musculoskeletal: She exhibits edema. She exhibits no tenderness.  Neurological: She is alert. She has normal strength. No cranial nerve deficit (no facial droop, extraocular movements intact, no slurred speech) or sensory deficit. She exhibits normal muscle tone. She displays no  seizure activity. Coordination normal.  Skin: Skin is warm and dry. No rash noted.  Psychiatric: She has a normal mood and affect.  Nursing note and vitals reviewed.   ED Course  Procedures   CRITICAL CARE Performed by: EVOJJ,KKX Total critical care time: 30 minutes Critical care time was exclusive of separately billable procedures and treating other patients. Critical care was necessary to treat or prevent imminent or life-threatening deterioration. Critical care was time spent personally by me on the following activities: development of treatment plan with patient and/or surrogate as well as nursing, discussions with consultants, evaluation of patient's response to treatment, examination of patient, obtaining history from patient or surrogate, ordering and performing treatments and interventions, ordering and review of laboratory studies, ordering and review of radiographic studies, pulse oximetry and re-evaluation of patient's condition.  Labs Review Labs Reviewed  CBC WITH DIFFERENTIAL/PLATELET - Abnormal; Notable for the following:    WBC 14.0 (*)    RBC 3.47 (*)    Hemoglobin 9.9 (*)    HCT 31.4 (*)    Neutro Abs 11.2 (*)    Monocytes Absolute 1.4 (*)    All other components within normal limits  COMPREHENSIVE METABOLIC PANEL - Abnormal; Notable for the following:    Sodium 131 (*)    Potassium 6.0 (*)    CO2 20 (*)    Glucose, Bld  143 (*)    BUN 55 (*)    Creatinine, Ser 2.39 (*)    Calcium 8.3 (*)    Albumin 2.4 (*)    ALT 12 (*)    GFR calc non Af Amer 19 (*)    GFR calc Af Amer 22 (*)    All other components within normal limits  BRAIN NATRIURETIC PEPTIDE - Abnormal; Notable for the following:    B Natriuretic Peptide 570.0 (*)    All other components within normal limits  MAGNESIUM - Abnormal; Notable for the following:    Magnesium 2.5 (*)    All other components within normal limits  APTT  PROTIME-INR  URINALYSIS, ROUTINE W REFLEX MICROSCOPIC (NOT AT [facility])  I-STAT TROPOININ, ED  Randolm Idol, ED    Imaging Review Dg Chest 2 View  09/27/2015  CLINICAL DATA:  Shortness of breath, history hypertension, diabetes mellitus, lung cancer EXAM: CHEST  2 VIEW COMPARISON:  09/16/2015 ; correlation PET-CT 08/07/2015, CT chest 07/29/2015 FINDINGS: Enlargement of cardiac silhouette. Atherosclerotic calcification aorta. Stable mediastinal contours. Large soft tissue opacity/mass LEFT upper lobe compatible with tumor. Bronchitic changes with mild atelectasis at RIGHT base. Additional opacity at the RIGHT base is somewhat more focal; this corresponds with area of infiltrate and underlying non nodule at RIGHT base by prior imaging. Remaining lungs clear. Small RIGHT pleural effusion. No pneumothorax. Bones demineralized. IMPRESSION: Large LEFT upper lobe mass. Infiltrate versus atelectasis and known underlying lung nodule at RIGHT lung base with small RIGHT pleural effusion. Enlargement of cardiac silhouette. Aortic atherosclerosis. Electronically Signed   By: Lavonia Dana M.D.   On: 09/27/2015 12:23   I have personally reviewed and evaluated these images and lab results as part of my medical decision-making.   EKG Interpretation   Date/Time:  Friday September 27 2015 10:34:17 EDT Ventricular Rate:  51 PR Interval:  164 QRS Duration: 130 QT Interval:  484 QTC Calculation: 446 R Axis:   95 Text Interpretation:  Sinus  bradycardia Right bundle branch block Abnormal  ECG No significant change since last tracing Confirmed by Sanuel Ladnier  MD-J, Lynore Coscia  ([id number]) on  09/27/2015 10:50:23 AM      Medications given in the ED Medications  sodium chloride 0.9 % bolus 500 mL (not administered)  ceFEPIme (MAXIPIME) 1 g in dextrose 5 % 50 mL IVPB (not administered)     MDM   Final diagnoses:  Healthcare-associated pneumonia  Lung mass    Patient's chest x-ray shows her known left upper lobe mass. There also appears to be possible atelectasis versus pneumonia at the right lung base.  Doubt CHF.  Laboratory tests do show an elevated white blood cell count. She also has worsening renal insufficiency associated with hyperkalemia. No acute EKG changes. We'll plan on hydration and start her on IV antibiotics.  Consult with medical service for admission    Dorie Rank, MD 09/27/15 1253  Dorie Rank, MD 09/27/15 1253

## 2015-09-27 NOTE — ED Notes (Signed)
Pt reports SOB and 10lb weight gain recently. History of CHF. Respirations unlabored upon arrival to ED.

## 2015-09-27 NOTE — H&P (Signed)
[facility] Admission History and Physical Service Pager: [phone]  Patient name: Natalie Porter Medical record number: [id number] Date of birth: 12/03/1940 Age: 75 y.o. Gender: female  Primary Care Provider: Cyndy Freeze, MD Consultants: none Code Status: FULL, daughter is healthcare POA  Chief Complaint: shortness of breath  Assessment and Plan: Natalie Porter is a 75 y.o. female presenting with shortness of breath. PMH is significant for HFpEF, T2DM, stage 3 CKD, COPD, GERD, PVD, CAD, HLD, OSA, anemia, lung CA, osteoarthritis.   Shortness of breath. Likely CHF exacerbation vs HCAP. Presented with hypoxia to 88% on RA and WBC 14.4 but afebrile on presentation. Maintaining appropriate O2 sats on 2L. Has had subjective fevers. Received vanc/cefepime and IVF in ED. More likely CHF exacerbation because patient has orthopnea, 10lb wt gain, BNP 570 with JVD, rhonchi on lung exam with trace LE edema. Unlikely PE, Wells score 3. CXR infiltrate versus atelectasis and known underlying lung nodule at R lung base with small R pleural effusion. - Place in observation, attending Dr. Erin Porter - D/c IVF and vanc/cefepime - Started IV lasix '80mg'$  qd for diuresis - Monitor respiratory status - Strict I/Os, daily weights   AKI in setting of stage 3 CKD. Cr on admission 2.39, baseline 1.4. Likely cardiorenal d/t signs of volume overload. Unlikely prerenal AKI.  - Diuresis as above - AM BMP  Chest pain. Likely musculoskeletal vs 2/2 cough because TTP to palpation of chest wall and resolves with lying flat. Unlikely ACS or PE (trop neg x2 and Wells score 3) - Continue to monitor - Tylenol PRN pain control  UTI. Patient had urinary sx and received 2 days of ciprofloxacin prior to admission with improvement. Denying dysuria or increased urinary frequency.  - Continue ciprofloxacin for a total of 5 days, last dose on 7/9    Constipation. Patient has had  constipation and bloating, was given enema at hospital at [facility] two days prior to presentation with some relief but states has continued discomfort. Abdomen slightly distended with some TTP.  - Docusate - Add Senna if no BM  T2DM. On lantus 30U at home. Last a1c 7.0 (07/22/1998) - Monitor CBGs ACHS - Lantus 15U and SSI - A1C pending  HTN. BP 169/54 on admission.  - Continue home meds norvasc, coreg, ARB - Monitor BP  COPD. Does not currently appear to be in exacerbation, no sputum production. Patient has chronic cough that has been worked up as outpatient and states is not changed from her baseline - Continue home Spiriva  HLD.  - Continue statin  PVD.  - Continue gabapentin  GERD. - Protonix  OSA. - CPAP at night  H/o depression - Continue home lexapro  FEN/GI: SLIV, heart healthy diet, Protonix Prophylaxis: heparin  Disposition: place in observation  History of Present Illness:  Natalie Porter is a 75 y.o. female presenting with shortness of breath.  Reports being recently diagnosed with UTI and constipation at [facility] on July 4th.  Started on ciprofloxacin with improvement of her urinary symptoms yesterday. Was given enema and Bentyl for constipation and abdominal pain; patient's daughter states Bentyl made the patient "crazy and talking funny." Endorses some continued abdominal pain, which she describes as an "aching" sharp pain. Thinks this may be because UTI has not completely resolved. Denies nausea, vomiting. Urinating less frequently than usual despite drinking more. Denies polydipsia, however patient's daughter is making her drink more water to help resolve UTI.   Patient's daughter reports that  the patient has had a 10 pound weight gain in the past five days. Patient states started having shortness of breath 2 days ago with associated chest discomfort bilaterally. Chest pain is a "sharp jab" with movement and resolves when lying flat but  shortness of breath is worse; has had to sleep in recliner and therefore has not been able to use her CPAP at night for the past two nights. Endorses subjective fever (Tmax 101 yesterday AM, defervesced with Tylenol), chills, non-productive cough. Patient does not typically require assistance when ambulating at home, but has required a walker for the past two days.  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 Patient previously received treatment for malignancy in R lung, thought has been told that nodule on R is only scar tissue now. She will begin a course of 10 radiation treatments for the L lung nodule on 09/30/15.  Also has shoulder pain that states feels separate from her chest pain, describes as "like bones rubbing together." Has taken Flexeril with no relief.   Review Of Systems: Per HPI otherwise the remainder of the systems were negative.  Patient Active Problem List   Diagnosis Date Noted  . HCAP (healthcare-associated pneumonia) 09/27/2015  . Dyspnea 09/27/2015  . OSA on CPAP 07/08/2015  . CAD in native artery 07/08/2015  . Primary cancer of right lower lobe of lung ([place]) 03/07/2015  . COPD (chronic obstructive pulmonary disease) ([place]) 12/11/2014  . Peripheral vascular disease, unspecified ([place]) 11/23/2013  . Nonhealing nonsurgical wound 08/17/2013  . Incisional infection 08/11/2013  . CKD (chronic kidney disease) stage 3, GFR 30-59 ml/min 08/09/2013  . Cardiorenal syndrome 08/02/2013  . Diastolic CHF, acute on chronic (HCC) 07/30/2013  . Anemia 07/30/2013  . Hypertension 07/30/2013  . HLD (hyperlipidemia) 07/30/2013  . PAD (peripheral artery disease) ([place]) 07/21/2013  . Chronic kidney disease (CKD), stage III (moderate) 06/17/2013  . 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 Leg pain 06/15/2013  . Long term current use of anticoagulant 12/20/2012  . Elevated erythrocyte sedimentation rate  10/20/2012  . Deep vein thrombosis ([place]) 10/10/2012  . Carotid artery narrowing 07/27/2012  . Clinical depression 07/27/2012  . Diabetes mellitus, type 2 ([place]) 07/27/2012  . Difficulty Porter 07/27/2012  . Adult BMI 30+ 06/21/2012  . Adiposity 06/21/2012  . History of deep venous thrombosis 12/24/2011  . Vascular disorder of lower extremity 12/24/2011  . CAFL (chronic airflow limitation) ([place]) 09/15/2011  . Lung nodule, multiple 09/15/2011  . Biliary calculi 08/04/2011  . Allergic rhinitis 07/24/2011  . Obstructive apnea 07/24/2011  . Arthritis, degenerative 07/24/2011  . Ex-cigarette smoker 07/23/2011  . Personal history of nicotine dependence 07/23/2011  . 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 Diabetes mellitus without complication ([place])   . Hypertension   . Renal disorder     stage 3  . Gallstone   . Arthritis   . DVT (deep venous thrombosis) ([place])     2011  . COPD (chronic obstructive pulmonary disease) ([place])   . Pneumonia 2014  . Neuropathy ([place])   . GERD (gastroesophageal reflux disease)   . Anemia   . Hypercholesteremia   . CHF (congestive heart failure) (HCC)     Preserved EF.    . Iron deficiency anemia   . Vitamin B12 deficiency   . PVD (peripheral vascular disease) ([place])   . Osteoarthritis   .  CAD (coronary artery disease)     Cath 2015, 50% calcified LAD, 60% circumflex stenosis, right coronary artery 70% stenosis medical management  . Lung cancer [facility])     New diagnosis (treated at [facility]).  Going to receive radiation  . Sleep apnea     CPAP  . Radiation 03/26/15, 03/28/15, 04/02/15    right lower lobe nodule SBRT 54 gray    Past Surgical History: Past Surgical History  Procedure Laterality Date  . Tubal ligation    . Carpal tunnel release Bilateral   . Eye surgery Bilateral     cataract  . Foot surgery Bilateral     bone spurs, and repair  . Carotid  endarterectomy Right 1990  . Breast biopsy Left   . Lung biopsy      non cancerous  . Colonoscopy w/ polypectomy    . Femoral-femoral bypass graft  08/07/2013    Procedure: BYPASS GRAFT FEMORAL-FEMORAL ARTERY USING 8MM X 30CM HEMASHIELD GRAFT; LEFT ILIAC ANGIOGRAM; ATTEMPTED LEFT ILIAC ARTERY STENT GRAFT;  Surgeon: Elam Dutch, MD;  Location: [place];  Service: Vascular;;  . Application of wound vac Bilateral 08/07/2013    Procedure: APPLICATION OF INCISIONAL WOUND [facility];  Surgeon: Elam Dutch, MD;  Location: [place];  Service: Vascular;  Laterality: Bilateral;  . Endarterectomy femoral Left 08/07/2013    Procedure: ENDARTERECTOMY FEMORAL;  Surgeon: Elam Dutch, MD;  Location: [place];  Service: Vascular;  Laterality: Left;  . Abdominal aortagram N/A 07/25/2013    Procedure: ABDOMINAL Maxcine Ham;  Surgeon: Serafina Mitchell, MD;  Location[facility] CATH LAB;  Service: Cardiovascular;  Laterality: N/A;  . Left heart catheterization with coronary angiogram N/A 07/27/2013    Procedure: LEFT HEART CATHETERIZATION WITH CORONARY ANGIOGRAM;  Surgeon: Minus Breeding, MD;  Location: [facility] CATH LAB;  Service: Cardiovascular;  Laterality: N/A;    Social History: Social History  Substance Use Topics  . Smoking status: Former Smoker -- 1.00 packs/day for 34 years    Types: Cigarettes    Quit date: 03/28/1988  . Smokeless tobacco: Never Used  . Alcohol Use: No  Smoked for 40 years, quit in 1990; was smoking 2 packs per day.  Denies illicit drug use.  Lives with her granddaughter.   Please also refer to relevant sections of EMR.  Family History: Family History  Problem Relation Age of Onset  . Heart attack Mother 25  . Stroke Father   . Heart attack Father 38  . Cancer Sister 18    Uterian  . Cancer Sister     Lung    Allergies and Medications: Allergies  Allergen Reactions  . Codeine Nausea Only  . Morphine And Related Nausea And Vomiting   No current facility-administered medications on file  prior to encounter.   Current Outpatient Prescriptions on File Prior to Encounter  Medication Sig Dispense Refill  . acetaminophen-codeine (TYLENOL #3) 300-30 MG tablet Take 1 tablet by mouth 2 (two) times daily as needed for moderate pain.   0  . amLODipine (NORVASC) 10 MG tablet Take 10 mg by mouth daily.    Marland Kitchen aspirin EC 81 MG tablet Take 81 mg by mouth daily.    Marland Kitchen atorvastatin (LIPITOR) 80 MG tablet Take 80 mg by mouth daily.    . Calcium Carbonate (CALCIUM-CARB 600 PO) Take 600 mg by mouth 2 (two) times daily.    . carvedilol (COREG) 25 MG tablet Take 25 mg by mouth 2 (two) times daily with a meal.    .  chlorthalidone (HYGROTON) 50 MG tablet Take 50 mg by mouth daily.    Marland Kitchen escitalopram (LEXAPRO) 20 MG tablet Take 20 mg by mouth daily.    . fluticasone (FLONASE) 50 MCG/ACT nasal spray Place 2 sprays into both nostrils daily.    . furosemide (LASIX) 80 MG tablet TAKE 1 TABLET DAILY 90 tablet 3  . gabapentin (NEURONTIN) 100 MG capsule Take 200-300 mg by mouth 2 (two) times daily. 200 mg in the morning and 300 mg at bedtime    . insulin glargine (LANTUS) 100 UNIT/ML injection Inject 30 Units into the skin at bedtime.     Marland Kitchen tiotropium (SPIRIVA) 18 MCG inhalation capsule Place 18 mcg into inhaler and inhale daily.    . valsartan (DIOVAN) 80 MG tablet Take 80 mg by mouth daily.      Objective: BP 169/54 mmHg  Pulse 53  Temp(Src) 99.1 F (37.3 C) (Oral)  Resp 20  Ht '5\' 2"'$  (1.575 m)  Wt 185 lb (83.915 kg)  BMI 33.83 kg/m2  SpO2 98%   Exam: General: lying in bed comfortably with nasal cannula on ENTM: moist mucous membranes Neck: +JVD Cardiovascular: RRR, no murmurs appreciated. 1+ DP pulses. Trace ankle edema b/l. Respiratory: diffuse rhonchi with b/l crackles at bases Abdomen: hypoactive bowel sounds, mildly distended, some guarding and TTP over L lower quadrant with some fullness - otherwise abdomen soft. Skin: no rashes, warm and dry Neuro: alert and oriented, no focal  deficits Psych: appropriate affect  Labs and Imaging: CBC BMET   Recent Labs Lab 09/27/15 1116  WBC 14.0*  HGB 9.9*  HCT 31.4*  PLT 171    Recent Labs Lab 09/27/15 1116  NA 131*  K 6.0*  CL 104  CO2 20*  BUN 55*  CREATININE 2.39*  GLUCOSE 143*  CALCIUM 8.3*     BNP 570  Trop 0.04, 0.03   Dg Chest 2 View  09/27/2015  CLINICAL DATA:  Shortness of breath, history hypertension, diabetes mellitus, lung cancer EXAM: CHEST  2 VIEW COMPARISON:  09/16/2015 ; correlation PET-CT 08/07/2015, CT chest 07/29/2015 FINDINGS: Enlargement of cardiac silhouette. Atherosclerotic calcification aorta. Stable mediastinal contours. Large soft tissue opacity/mass LEFT upper lobe compatible with tumor. Bronchitic changes with mild atelectasis at RIGHT base. Additional opacity at the RIGHT base is somewhat more focal; this corresponds with area of infiltrate and underlying non nodule at RIGHT base by prior imaging. Remaining lungs clear. Small RIGHT pleural effusion. No pneumothorax. Bones demineralized. IMPRESSION: Large LEFT upper lobe mass. Infiltrate versus atelectasis and known underlying lung nodule at RIGHT lung base with small RIGHT pleural effusion. Enlargement of cardiac silhouette. Aortic atherosclerosis. Electronically Signed   By: Lavonia Dana M.D.   On: 09/27/2015 12:23    Verner Mould, MD 09/27/2015, 7:58 PM PGY-1, [place] Intern pager: [phone], text pages welcome  UPPER LEVEL ADDENDUM  I have read the above note and made revisions highlighted in orange.  Adin Hector, MD, MPH PGY-2 [place] [facility] Pager [phone]

## 2015-09-27 NOTE — ED Notes (Signed)
Attempted report 

## 2015-09-27 NOTE — Telephone Encounter (Signed)
Returned call to patient's daughter Horris Latino. She states pt was seen July 4th in [facility] ED for UTI. Notes she was put on course of cipro and dicyclamine, and the pt was encouraged to incr fluids. Daughter states now pt has a wt gain of 10 lbs in 3 days, is edematous and SOB at rest, and she also doesn't seem to have responded to AB's (still running fever).  Daughter asked if ED appropriate - I advised yes based on my understanding of her symptoms. Daughter voices understanding and indicates that she will ready patient and bring her via private vehicle. I have called [facility] and spoken w triage RN to relay these recommendations and pending arrival. Have contacted [place], cardiology cardmaster, and she is aware of patient.

## 2015-09-27 NOTE — ED Notes (Signed)
Report called  

## 2015-09-27 NOTE — ED Notes (Signed)
Patient transported to X-ray 

## 2015-09-28 DIAGNOSIS — Z9841 Cataract extraction status, right eye: Secondary | ICD-10-CM | POA: Diagnosis not present

## 2015-09-28 DIAGNOSIS — E1121 Type 2 diabetes mellitus with diabetic nephropathy: Secondary | ICD-10-CM | POA: Diagnosis present

## 2015-09-28 DIAGNOSIS — I130 Hypertensive heart and chronic kidney disease with heart failure and stage 1 through stage 4 chronic kidney disease, or unspecified chronic kidney disease: Secondary | ICD-10-CM | POA: Diagnosis present

## 2015-09-28 DIAGNOSIS — R0602 Shortness of breath: Secondary | ICD-10-CM | POA: Diagnosis not present

## 2015-09-28 DIAGNOSIS — R0600 Dyspnea, unspecified: Secondary | ICD-10-CM | POA: Diagnosis not present

## 2015-09-28 DIAGNOSIS — Z794 Long term (current) use of insulin: Secondary | ICD-10-CM | POA: Diagnosis not present

## 2015-09-28 DIAGNOSIS — J189 Pneumonia, unspecified organism: Secondary | ICD-10-CM | POA: Diagnosis not present

## 2015-09-28 DIAGNOSIS — N179 Acute kidney failure, unspecified: Secondary | ICD-10-CM | POA: Diagnosis present

## 2015-09-28 DIAGNOSIS — F329 Major depressive disorder, single episode, unspecified: Secondary | ICD-10-CM | POA: Diagnosis present

## 2015-09-28 DIAGNOSIS — K219 Gastro-esophageal reflux disease without esophagitis: Secondary | ICD-10-CM | POA: Diagnosis present

## 2015-09-28 DIAGNOSIS — I2510 Atherosclerotic heart disease of native coronary artery without angina pectoris: Secondary | ICD-10-CM | POA: Diagnosis present

## 2015-09-28 DIAGNOSIS — M069 Rheumatoid arthritis, unspecified: Secondary | ICD-10-CM | POA: Diagnosis present

## 2015-09-28 DIAGNOSIS — Z87891 Personal history of nicotine dependence: Secondary | ICD-10-CM | POA: Diagnosis not present

## 2015-09-28 DIAGNOSIS — N183 Chronic kidney disease, stage 3 (moderate): Secondary | ICD-10-CM | POA: Diagnosis present

## 2015-09-28 DIAGNOSIS — K5900 Constipation, unspecified: Secondary | ICD-10-CM | POA: Diagnosis present

## 2015-09-28 DIAGNOSIS — Z79899 Other long term (current) drug therapy: Secondary | ICD-10-CM | POA: Diagnosis not present

## 2015-09-28 DIAGNOSIS — Z86718 Personal history of other venous thrombosis and embolism: Secondary | ICD-10-CM | POA: Diagnosis not present

## 2015-09-28 DIAGNOSIS — N390 Urinary tract infection, site not specified: Secondary | ICD-10-CM | POA: Diagnosis present

## 2015-09-28 DIAGNOSIS — J440 Chronic obstructive pulmonary disease with acute lower respiratory infection: Secondary | ICD-10-CM | POA: Diagnosis present

## 2015-09-28 DIAGNOSIS — E1151 Type 2 diabetes mellitus with diabetic peripheral angiopathy without gangrene: Secondary | ICD-10-CM | POA: Diagnosis present

## 2015-09-28 DIAGNOSIS — D509 Iron deficiency anemia, unspecified: Secondary | ICD-10-CM | POA: Diagnosis present

## 2015-09-28 DIAGNOSIS — I5032 Chronic diastolic (congestive) heart failure: Secondary | ICD-10-CM | POA: Diagnosis present

## 2015-09-28 DIAGNOSIS — E1122 Type 2 diabetes mellitus with diabetic chronic kidney disease: Secondary | ICD-10-CM | POA: Diagnosis present

## 2015-09-28 DIAGNOSIS — E7800 Pure hypercholesterolemia, unspecified: Secondary | ICD-10-CM | POA: Diagnosis present

## 2015-09-28 DIAGNOSIS — Z9842 Cataract extraction status, left eye: Secondary | ICD-10-CM | POA: Diagnosis not present

## 2015-09-28 DIAGNOSIS — G4733 Obstructive sleep apnea (adult) (pediatric): Secondary | ICD-10-CM | POA: Diagnosis present

## 2015-09-28 DIAGNOSIS — Z7982 Long term (current) use of aspirin: Secondary | ICD-10-CM | POA: Diagnosis not present

## 2015-09-28 DIAGNOSIS — E875 Hyperkalemia: Secondary | ICD-10-CM | POA: Diagnosis present

## 2015-09-28 DIAGNOSIS — C3492 Malignant neoplasm of unspecified part of left bronchus or lung: Secondary | ICD-10-CM | POA: Diagnosis present

## 2015-09-28 LAB — CBC
HCT: 28.3 % — ABNORMAL LOW (ref 36.0–46.0)
HEMOGLOBIN: 8.9 g/dL — AB (ref 12.0–15.0)
MCH: 28.6 pg (ref 26.0–34.0)
MCHC: 31.4 g/dL (ref 30.0–36.0)
MCV: 91 fL (ref 78.0–100.0)
Platelets: 174 10*3/uL (ref 150–400)
RBC: 3.11 MIL/uL — ABNORMAL LOW (ref 3.87–5.11)
RDW: 14.6 % (ref 11.5–15.5)
WBC: 19.4 10*3/uL — ABNORMAL HIGH (ref 4.0–10.5)

## 2015-09-28 LAB — BASIC METABOLIC PANEL
Anion gap: 7 (ref 5–15)
BUN: 59 mg/dL — ABNORMAL HIGH (ref 6–20)
CHLORIDE: 104 mmol/L (ref 101–111)
CO2: 20 mmol/L — AB (ref 22–32)
Calcium: 8.2 mg/dL — ABNORMAL LOW (ref 8.9–10.3)
Creatinine, Ser: 2.42 mg/dL — ABNORMAL HIGH (ref 0.44–1.00)
GFR calc non Af Amer: 18 mL/min — ABNORMAL LOW (ref >60)
GFR, EST AFRICAN AMERICAN: 21 mL/min — AB (ref >60)
Glucose, Bld: 126 mg/dL — ABNORMAL HIGH (ref 65–99)
POTASSIUM: 5.6 mmol/L — AB (ref 3.5–5.1)
Sodium: 131 mmol/L — ABNORMAL LOW (ref 135–145)

## 2015-09-28 LAB — STREP PNEUMONIAE URINARY ANTIGEN: Strep Pneumo Urinary Antigen: NEGATIVE

## 2015-09-28 LAB — GLUCOSE, CAPILLARY
GLUCOSE-CAPILLARY: 144 mg/dL — AB (ref 65–99)
GLUCOSE-CAPILLARY: 264 mg/dL — AB (ref 65–99)
Glucose-Capillary: 156 mg/dL — ABNORMAL HIGH (ref 65–99)
Glucose-Capillary: 289 mg/dL — ABNORMAL HIGH (ref 65–99)

## 2015-09-28 MED ORDER — DEXTROSE 5 % IV SOLN
500.0000 mg | INTRAVENOUS | Status: DC
  Administered 2015-09-28 – 2015-09-29 (×2): 500 mg via INTRAVENOUS
  Filled 2015-09-28 (×3): qty 500

## 2015-09-28 MED ORDER — ALBUTEROL SULFATE (2.5 MG/3ML) 0.083% IN NEBU
2.5000 mg | INHALATION_SOLUTION | Freq: Three times a day (TID) | RESPIRATORY_TRACT | Status: DC
  Administered 2015-09-28 – 2015-10-01 (×10): 2.5 mg via RESPIRATORY_TRACT
  Filled 2015-09-28 (×10): qty 3

## 2015-09-28 MED ORDER — PREDNISONE 50 MG PO TABS
50.0000 mg | ORAL_TABLET | Freq: Every day | ORAL | Status: DC
  Administered 2015-09-28 – 2015-09-30 (×3): 50 mg via ORAL
  Filled 2015-09-28 (×3): qty 1

## 2015-09-28 MED ORDER — DEXTROSE 5 % IV SOLN
1.0000 g | INTRAVENOUS | Status: DC
  Administered 2015-09-28 – 2015-09-30 (×3): 1 g via INTRAVENOUS
  Filled 2015-09-28 (×4): qty 10

## 2015-09-28 MED ORDER — ALBUTEROL SULFATE (2.5 MG/3ML) 0.083% IN NEBU
2.5000 mg | INHALATION_SOLUTION | RESPIRATORY_TRACT | Status: DC
  Administered 2015-09-28: 2.5 mg via RESPIRATORY_TRACT
  Filled 2015-09-28: qty 3

## 2015-09-29 LAB — GLUCOSE, CAPILLARY
GLUCOSE-CAPILLARY: 404 mg/dL — AB (ref 65–99)
Glucose-Capillary: 251 mg/dL — ABNORMAL HIGH (ref 65–99)
Glucose-Capillary: 335 mg/dL — ABNORMAL HIGH (ref 65–99)
Glucose-Capillary: 449 mg/dL — ABNORMAL HIGH (ref 65–99)

## 2015-09-29 LAB — BASIC METABOLIC PANEL
Anion gap: 7 (ref 5–15)
BUN: 72 mg/dL — AB (ref 6–20)
CALCIUM: 8.3 mg/dL — AB (ref 8.9–10.3)
CO2: 21 mmol/L — AB (ref 22–32)
CREATININE: 2.51 mg/dL — AB (ref 0.44–1.00)
Chloride: 103 mmol/L (ref 101–111)
GFR calc Af Amer: 20 mL/min — ABNORMAL LOW (ref >60)
GFR, EST NON AFRICAN AMERICAN: 18 mL/min — AB (ref >60)
GLUCOSE: 246 mg/dL — AB (ref 65–99)
Potassium: 5.2 mmol/L — ABNORMAL HIGH (ref 3.5–5.1)
Sodium: 131 mmol/L — ABNORMAL LOW (ref 135–145)

## 2015-09-29 LAB — CBC
HCT: 28.4 % — ABNORMAL LOW (ref 36.0–46.0)
Hemoglobin: 8.6 g/dL — ABNORMAL LOW (ref 12.0–15.0)
MCH: 27.7 pg (ref 26.0–34.0)
MCHC: 30.3 g/dL (ref 30.0–36.0)
MCV: 91.3 fL (ref 78.0–100.0)
Platelets: 178 10*3/uL (ref 150–400)
RBC: 3.11 MIL/uL — ABNORMAL LOW (ref 3.87–5.11)
RDW: 14.1 % (ref 11.5–15.5)
WBC: 14.4 10*3/uL — ABNORMAL HIGH (ref 4.0–10.5)

## 2015-09-29 LAB — HIV ANTIBODY (ROUTINE TESTING W REFLEX): HIV SCREEN 4TH GENERATION: NONREACTIVE

## 2015-09-29 MED ORDER — INSULIN GLARGINE 100 UNIT/ML ~~LOC~~ SOLN
18.0000 [IU] | Freq: Every day | SUBCUTANEOUS | Status: DC
  Administered 2015-09-29 – 2015-09-30 (×2): 18 [IU] via SUBCUTANEOUS
  Filled 2015-09-29 (×2): qty 0.18

## 2015-09-29 MED ORDER — INSULIN ASPART 100 UNIT/ML ~~LOC~~ SOLN
5.0000 [IU] | Freq: Once | SUBCUTANEOUS | Status: AC
  Administered 2015-09-29: 5 [IU] via SUBCUTANEOUS

## 2015-09-29 MED ORDER — INSULIN ASPART 100 UNIT/ML ~~LOC~~ SOLN
0.0000 [IU] | Freq: Three times a day (TID) | SUBCUTANEOUS | Status: DC
  Administered 2015-09-30: 7 [IU] via SUBCUTANEOUS
  Administered 2015-09-30: 11 [IU] via SUBCUTANEOUS
  Administered 2015-09-30: 15 [IU] via SUBCUTANEOUS
  Administered 2015-10-01: 11 [IU] via SUBCUTANEOUS
  Administered 2015-10-01: 7 [IU] via SUBCUTANEOUS

## 2015-09-29 MED ORDER — INSULIN ASPART 100 UNIT/ML ~~LOC~~ SOLN
24.0000 [IU] | Freq: Once | SUBCUTANEOUS | Status: AC
  Administered 2015-09-29: 24 [IU] via SUBCUTANEOUS

## 2015-09-29 MED ORDER — GUAIFENESIN-DM 100-10 MG/5ML PO SYRP
5.0000 mL | ORAL_SOLUTION | ORAL | Status: DC | PRN
  Administered 2015-09-30: 5 mL via ORAL
  Filled 2015-09-29: qty 5

## 2015-09-29 NOTE — Progress Notes (Signed)
Family Medicine Teaching Service Daily Progress Note Intern Pager: [phone]  Patient name: Natalie Porter Medical record number: [id number] Date of birth: 02-01-1941 Age: 75 y.o. Gender: female  Primary Care Provider: Cyndy Freeze, MD Consultants: None Code Status: FULL  Pt Overview and Major Events to Date:  7/7 - admitted to [place]; received one dose vanc/cefepime  Assessment and Plan: Natalie Porter is a 75 y.o. female presenting with shortness of breath. PMH is significant for HFpEF, T2DM, stage 3 CKD, COPD, GERD, PVD, CAD, HLD, OSA, anemia, lung CA, osteoarthritis.   Shortness of breath. Likely CHF exacerbation vs HCAP. Presented with hypoxia to 88% on RA and WBC 14.4 but afebrile on presentation. Has had subjective fevers.  Started on antibiotics and diuresis. Weight slightly up to 183lb from 181lb yesterday (7/8). Sats in mid 90s on 2L  overnight, patient feeling better. Past 24 hrs UOP -666m.  -- Discontinue IV Lasix '80mg'$  qd 2/2 increase creatinine and clinical improvement -- Monitor respiratory status -- Strict I/Os, daily weights  -- Consider PE work-up if no improvement with diuresis -- Continue albuterol nebs q4 scheduled.  AKI in setting of Stage 3 CKD. Cr on admission 2.39, baseline 1.4. This morning (7/9) creatinine is 2.51.  Most likely a mixed picture of cardiorenal d/t signs of volume overload and some prerenal AKI. -- Discontinue IC lasix 80 mg qd -- Continue to monitor  Chest pain. Likely musculoskeletal vs 2/2 cough because TTP to palpation of chest wall and resolves with lying flat. Unlikely ACS or PE (trop neg x2 and Wells score 1). Persistent but only when coughing this AM. - Continue to monitor - Tylenol PRN pain control  UTI. Patient had urinary sx and received 2 days of ciprofloxacin prior to admission with improvement. Denying dysuria or increased urinary frequency.  -- Patient switched to ceftriaxone as part of CAP coverage -- Discontinue  ciprofloxacin    Constipation. Patient has had constipation and bloating, [facility] with some relief but states has continued discomfort. Abdomen tender to palpation on the R and L upper quadrant.  --Docusate --Gave Soap SUDS enema --Consider adding Miralax  T2DM. On lantus 30U at home. Last a1c 7.0 (07/22/1998). CBG 144 overnight.  - Monitor CBGs ACHS - Lantus 15U and SSI - A1C pending  HTN. BP 169/54 on admission. 158/58 overnight.  - Continue home meds norvasc, coreg, ARB - Monitor BP  COPD. Does not currently appear to be in exacerbation, no sputum production. Patient has chronic cough that has been worked up as outpatient and states is not changed from her baseline - Continue home Spiriva  HLD.  - Continue statin  PVD.  - Continue gabapentin  GERD. - Protonix  OSA. - CPAP at night  H/o depression. - Continue home lexapro  FEN/GI: SLIV, heart healthy diet, Protonix Prophylaxis: heparin  Disposition: Pending medical improvement  Subjective:  Patient's dyspnea this AM has improved, feels much better. Urinary frequency has decrease, did not have to urinate all night, she also denies dysuria. Patient still complained of abdominal distention and has not had a normal BM is a month. Cough has improved as well  Objective: Temp:  [97.6 F (36.4 C)-98.5 F (36.9 C)] 97.8 F (36.6 C) (07/09 1021) Pulse Rate:  [51-95] 53 (07/09 1021) Resp:  [18-20] 18 (07/09 1021) BP: (143-151)/(35-58) 150/58 mmHg (07/09 1036) SpO2:  [91 %-98 %] 96 % (07/09 1021) Weight:  [183 lb 8 oz (83.235  kg)] 183 lb 8 oz (83.235 kg) (07/09 0602) Physical Exam: General: lying in bed in NAD Cardiovascular: RRR, no murmurs appreciated Respiratory: audible wheezing while speaking to patient, prominent expiratory wheezes bilaterally in auscultation; 3L [place] in place; difficulty speaking in full sentences due to SOB Abdomen: mildly  distended,tender to palpation in the upper quadrants bilaterally, soft, -BS, no guarding or rebound. Extremities: trace edema to mid shin bilaterally  Laboratory:  Recent Labs Lab 09/27/15 1116 09/28/15 0326 09/29/15 0757  WBC 14.0* 19.4* 14.4*  HGB 9.9* 8.9* 8.6*  HCT 31.4* 28.3* 28.4*  PLT 171 174 178    Recent Labs Lab 09/27/15 1116 09/28/15 0326 09/29/15 0757  NA 131* 131* 131*  K 6.0* 5.6* 5.2*  CL 104 104 103  CO2 20* 20* 21*  BUN 55* 59* 72*  CREATININE 2.39* 2.42* 2.51*  CALCIUM 8.3* 8.2* 8.3*  PROT 7.6  --   --   BILITOT 0.3  --   --   ALKPHOS 44  --   --   ALT 12*  --   --   AST 15  --   --   GLUCOSE 143* 126* 246*    Imaging/Diagnostic Tests: Dg Chest 2 View  09/27/2015  CLINICAL DATA:  Shortness of breath, history hypertension, diabetes mellitus, lung cancer EXAM: CHEST  2 VIEW COMPARISON:  09/16/2015 ; correlation PET-CT 08/07/2015, CT chest 07/29/2015 FINDINGS: Enlargement of cardiac silhouette. Atherosclerotic calcification aorta. Stable mediastinal contours. Large soft tissue opacity/mass LEFT upper lobe compatible with tumor. Bronchitic changes with mild atelectasis at RIGHT base. Additional opacity at the RIGHT base is somewhat more focal; this corresponds with area of infiltrate and underlying non nodule at RIGHT base by prior imaging. Remaining lungs clear. Small RIGHT pleural effusion. No pneumothorax. Bones demineralized. IMPRESSION: Large LEFT upper lobe mass. Infiltrate versus atelectasis and known underlying lung nodule at RIGHT lung base with small RIGHT pleural effusion. Enlargement of cardiac silhouette. Aortic atherosclerosis. Electronically Signed   By: Lavonia Dana M.D.   On: 09/27/2015 12:23    Marjie Skiff, MD 09/29/2015, 12:11 PM PGY-1, [place] Intern pager: [phone], text pages welcome

## 2015-09-29 NOTE — Progress Notes (Signed)
Soap sud enema given per order with good result, pt. Tolerate well. Will continue to monitor patient.

## 2015-09-29 NOTE — Progress Notes (Signed)
RT set up patient home unit CPAP. Patient is able to place her self on when ready.

## 2015-09-30 ENCOUNTER — Ambulatory Visit
Admit: 2015-09-30 | Discharge: 2015-09-30 | Disposition: A | Discharge status: Home / Self Care | Payer: Medicare Other | Attending: Radiation Oncology | Admitting: Radiation Oncology

## 2015-09-30 ENCOUNTER — Ambulatory Visit: Payer: Medicare Other | Admitting: Radiation Oncology

## 2015-09-30 DIAGNOSIS — J189 Pneumonia, unspecified organism: Secondary | ICD-10-CM

## 2015-09-30 LAB — GLUCOSE, CAPILLARY
GLUCOSE-CAPILLARY: 243 mg/dL — AB (ref 65–99)
GLUCOSE-CAPILLARY: 343 mg/dL — AB (ref 65–99)
Glucose-Capillary: 337 mg/dL — ABNORMAL HIGH (ref 65–99)
Glucose-Capillary: 260 mg/dL — ABNORMAL HIGH (ref 65–99)

## 2015-09-30 LAB — BASIC METABOLIC PANEL
ANION GAP: 7 (ref 5–15)
ANION GAP: 11 (ref 5–15)
BUN: 78 mg/dL — ABNORMAL HIGH (ref 6–20)
BUN: 81 mg/dL — ABNORMAL HIGH (ref 6–20)
CALCIUM: 8.2 mg/dL — AB (ref 8.9–10.3)
CHLORIDE: 99 mmol/L — AB (ref 101–111)
CO2: 19 mmol/L — AB (ref 22–32)
CO2: 18 mmol/L — AB (ref 22–32)
Calcium: 7.7 mg/dL — ABNORMAL LOW (ref 8.9–10.3)
Chloride: 99 mmol/L — ABNORMAL LOW (ref 101–111)
Creatinine, Ser: 2.33 mg/dL — ABNORMAL HIGH (ref 0.44–1.00)
Creatinine, Ser: 2.08 mg/dL — ABNORMAL HIGH (ref 0.44–1.00)
GFR calc Af Amer: 22 mL/min — ABNORMAL LOW (ref >60)
GFR calc non Af Amer: 22 mL/min — ABNORMAL LOW (ref >60)
GFR, EST AFRICAN AMERICAN: 26 mL/min — AB (ref >60)
GFR, EST NON AFRICAN AMERICAN: 19 mL/min — AB (ref >60)
GLUCOSE: 379 mg/dL — AB (ref 65–99)
Glucose, Bld: 258 mg/dL — ABNORMAL HIGH (ref 65–99)
POTASSIUM: 5.5 mmol/L — AB (ref 3.5–5.1)
Potassium: 5.2 mmol/L — ABNORMAL HIGH (ref 3.5–5.1)
Sodium: 125 mmol/L — ABNORMAL LOW (ref 135–145)
Sodium: 128 mmol/L — ABNORMAL LOW (ref 135–145)

## 2015-09-30 LAB — CBC
HEMATOCRIT: 25.8 % — AB (ref 36.0–46.0)
HEMOGLOBIN: 8.5 g/dL — AB (ref 12.0–15.0)
MCH: 28.8 pg (ref 26.0–34.0)
MCHC: 32.9 g/dL (ref 30.0–36.0)
MCV: 87.5 fL (ref 78.0–100.0)
Platelets: 209 10*3/uL (ref 150–400)
RBC: 2.95 MIL/uL — ABNORMAL LOW (ref 3.87–5.11)
RDW: 13.9 % (ref 11.5–15.5)
WBC: 17 10*3/uL — ABNORMAL HIGH (ref 4.0–10.5)

## 2015-09-30 LAB — HEMOGLOBIN A1C
HEMOGLOBIN A1C: 6.9 % — AB (ref 4.8–5.6)
Mean Plasma Glucose: 151 mg/dL

## 2015-09-30 MED ORDER — CARVEDILOL 25 MG PO TABS
25.0000 mg | ORAL_TABLET | Freq: Two times a day (BID) | ORAL | Status: DC
  Administered 2015-10-01: 25 mg via ORAL
  Filled 2015-09-30: qty 1

## 2015-09-30 MED ORDER — PREDNISONE 50 MG PO TABS
50.0000 mg | ORAL_TABLET | Freq: Every day | ORAL | Status: DC
  Administered 2015-10-01: 50 mg via ORAL
  Filled 2015-09-30: qty 1

## 2015-09-30 MED ORDER — SODIUM POLYSTYRENE SULFONATE 15 GM/60ML PO SUSP
15.0000 g | Freq: Once | ORAL | Status: AC
  Administered 2015-09-30: 15 g via ORAL
  Filled 2015-09-30: qty 60

## 2015-09-30 MED ORDER — AZITHROMYCIN 250 MG PO TABS
250.0000 mg | ORAL_TABLET | Freq: Every day | ORAL | Status: DC
  Administered 2015-09-30 – 2015-10-01 (×2): 250 mg via ORAL
  Filled 2015-09-30 (×3): qty 1

## 2015-09-30 NOTE — Progress Notes (Signed)
Inpatient Diabetes Program Recommendations  AACE/ADA: New Consensus Statement on Inpatient Glycemic Control (2015)  Target Ranges:  Prepandial:   less than 140 mg/dL      Peak postprandial:   less than 180 mg/dL (1-2 hours)      Critically ill patients:  140 - 180 mg/dL   Lab Results  Component Value Date   GLUCAP 260* 09/30/2015   HGBA1C 6.9* 09/28/2015    Review of Glycemic ControlResults for SYMPHANI, ECKSTROM (MRN [id number]) as of 09/30/2015 12:58  Ref. Range 09/29/2015 06:46 09/29/2015 12:08 09/29/2015 16:40 09/29/2015 20:53 09/30/2015 05:48 09/30/2015 11:30  Glucose-Capillary Latest Ref Range: 65-99 mg/dL 251 (H) 335 (H) 449 (H) 404 (H) 337 (H) 260 (H)   Diabetes history: Type 2 diabetes Outpatient Diabetes medications: Lantus 30 units q HS,  Current orders for Inpatient glycemic control:  Lantus 18 units q HS, Novolog resistant tid with meals and HS  Inpatient Diabetes Program Recommendations:    Please increase Lantus to patient's home dose of 30 units q HS.  Also consider adding Novolog meal coverage 4 units tid with meals.  Thanks, Adah Perl, RN, BC-ADM Inpatient Diabetes Coordinator Pager [phone] (8a-5p)

## 2015-09-30 NOTE — Progress Notes (Signed)
Family Medicine Teaching Service Daily Progress Note Intern Pager: [phone]  Patient name: Natalie Porter Medical record number: [id number] Date of birth: October 06, 1940 Age: 75 y.o. Gender: female  Primary Care Provider: Cyndy Freeze, MD Consultants: None Code Status: FULL  Pt Overview and Major Events to Date:  7/7 - admitted to [place]; received one dose vanc/cefepime  Assessment and Plan: Natalie Porter is a 75 y.o. female presenting with shortness of breath. PMH is significant for HFpEF, T2DM, stage 3 CKD, COPD, GERD, PVD, CAD, HLD, OSA, anemia, lung CA, osteoarthritis.   #Shortness of breath. Likely CHF exacerbation vs HCAP. Sats in mid 90s on 1L [place] , patient feeling better. Past 24 hrs UOP -326m.  -- Consider restarting  patient on home dose lasix -- Monitor respiratory status -- Strict I/Os, daily weights  -- Continue albuterol nebs q4 scheduled.  #AKI in setting of Stage 3 CKD. Cr on admission 2.39, baseline 1.4. This morning (7/10) creatinine is 2.33 down from 2.51 yesterday.  Most likely a mixed picture of cardiorenal d/t signs of volume overload and some prerenal AKI. -- Consider restarting patient on home dose lasix 80 mg qd -- Continue to monitor kidney function   #Chest pain. Likely musculoskeletal vs 2/2 cough because TTP to palpation of chest wall and resolves with lying flat. Unlikely ACS or PE (trop neg x2 and Wells score 1). Persistent but only when coughing this AM. - Continue to monitor - Tylenol PRN pain control  #UTI. Patient continue to deny dysuria or increased urinary frequency.  --Patient on IV ceftriaxone '1mg'$  (day 2) and azithromycin '250mg'$  (day 2) as part of CAP coverage. Will transition to po antibiotics -- [facility]--Blood cultures show no growth to date (24hrs)  #Hyperkalemia, acute --Start patient on kayexalate --Follow on pm BMP    #Constipation. Improving, no tenderness on palpation during abdominal  exam --Docusate --Consider adding Miralax  #T2DM. On lantus 30U at home. Last a1c 7.0 (07/22/1998). CBG 144 overnight.  - Monitor CBGs ACHS - Lantus 15U and SSI - A1C pending  #HTN. BP 169/54 on admission. 158/58 overnight.  - Continue home meds norvasc, coreg, ARB - Monitor BP  COPD. Does not currently appear to be in exacerbation, no sputum production. Patient has chronic cough that has been worked up as outpatient and states is not changed from her baseline - Continue home Spiriva  HLD.  - Continue statin  PVD.  - Continue gabapentin  GERD. - Protonix  OSA. - CPAP at night  H/o depression. - Continue home lexapro  FEN/GI: SLIV, heart healthy diet, Protonix Prophylaxis: heparin  Disposition: Pending medical improvement  Subjective:  Patient is feeling much better this morning. Urinary symptoms have resolved. Patient had a BM yesterday after several day of constipation. Cough has improved as well.  Objective: Temp:  [97.6 F (36.4 C)-98.4 F (36.9 C)] 97.7 F (36.5 C) (07/10 0455) Pulse Rate:  [53-55] 54 (07/10 0455) Resp:  [16-18] 18 (07/10 0455) BP: (139-151)/(38-58) 141/56 mmHg (07/10 0455) SpO2:  [94 %-96 %] 96 % (07/10 0850) FiO2 (%):  [24 %] 24 % (07/10 0850) Weight:  [187 lb 6.4 oz (85.004 kg)] 187 lb 6.4 oz (85.004 kg) (07/10 0455) Physical Exam: General: lying in bed in NAD Cardiovascular: RRR, no murmurs appreciated Respiratory: Moving air clearly no increase work of  breathing; 1L [place] in place;  Abdomen:Soft, non distended, normal BS, no guarding or rebound. Extremities: trace edema to mid shin bilaterally  Laboratory:  Recent Labs Lab 09/27/15 1116 09/28/15 0326 09/29/15 0757  WBC 14.0* 19.4* 14.4*  HGB 9.9* 8.9* 8.6*  HCT 31.4* 28.3* 28.4*  PLT 171 174 178    Recent Labs Lab 09/27/15 1116 09/28/15 0326 09/29/15 0757 09/30/15 0322  NA 131* 131* 131* 125*  K 6.0* 5.6* 5.2* 5.5*  CL 104 104 103 99*  CO2 20* 20* 21* 19*  BUN 55*  59* 72* 78*  CREATININE 2.39* 2.42* 2.51* 2.33*  CALCIUM 8.3* 8.2* 8.3* 7.7*  PROT 7.6  --   --   --   BILITOT 0.3  --   --   --   ALKPHOS 44  --   --   --   ALT 12*  --   --   --   AST 15  --   --   --   GLUCOSE 143* 126* 246* 379*    Imaging/Diagnostic Tests: Dg Chest 2 View  09/27/2015  CLINICAL DATA:  Shortness of breath, history hypertension, diabetes mellitus, lung cancer EXAM: CHEST  2 VIEW COMPARISON:  09/16/2015 ; correlation PET-CT 08/07/2015, CT chest 07/29/2015 FINDINGS: Enlargement of cardiac silhouette. Atherosclerotic calcification aorta. Stable mediastinal contours. Large soft tissue opacity/mass LEFT upper lobe compatible with tumor. Bronchitic changes with mild atelectasis at RIGHT base. Additional opacity at the RIGHT base is somewhat more focal; this corresponds with area of infiltrate and underlying non nodule at RIGHT base by prior imaging. Remaining lungs clear. Small RIGHT pleural effusion. No pneumothorax. Bones demineralized. IMPRESSION: Large LEFT upper lobe mass. Infiltrate versus atelectasis and known underlying lung nodule at RIGHT lung base with small RIGHT pleural effusion. Enlargement of cardiac silhouette. Aortic atherosclerosis. Electronically Signed   By: Lavonia Dana M.D.   On: 09/27/2015 12:23    Marjie Skiff, MD 09/30/2015, 8:56 AM PGY-1, [place] Intern pager: [phone], text pages welcome

## 2015-09-30 NOTE — Care Management Important Message (Signed)
Important Message  Patient Details  Name: Natalie Porter MRN: [id number] Date of Birth: October 23, 1940   Medicare Important Message Given:  Yes    [name], Leroy Sea 09/30/2015, 8:21 AM

## 2015-09-30 NOTE — Progress Notes (Signed)
RT set up patient home UNIT CPAP. Patient is able to place her mask on herself. Patient requires no O2 bleed in.

## 2015-10-01 ENCOUNTER — Encounter (HOSPITAL_COMMUNITY): Payer: Self-pay | Admitting: General Practice

## 2015-10-01 LAB — CBC
HCT: 26.8 % — ABNORMAL LOW (ref 36.0–46.0)
Hemoglobin: 8.8 g/dL — ABNORMAL LOW (ref 12.0–15.0)
MCH: 28.7 pg (ref 26.0–34.0)
MCHC: 32.8 g/dL (ref 30.0–36.0)
MCV: 87.3 fL (ref 78.0–100.0)
PLATELETS: 234 10*3/uL (ref 150–400)
RBC: 3.07 MIL/uL — ABNORMAL LOW (ref 3.87–5.11)
RDW: 14.1 % (ref 11.5–15.5)
WBC: 13.3 10*3/uL — ABNORMAL HIGH (ref 4.0–10.5)

## 2015-10-01 LAB — GLUCOSE, CAPILLARY
GLUCOSE-CAPILLARY: 277 mg/dL — AB (ref 65–99)
GLUCOSE-CAPILLARY: 242 mg/dL — AB (ref 65–99)

## 2015-10-01 LAB — BASIC METABOLIC PANEL
Anion gap: 8 (ref 5–15)
BUN: 86 mg/dL — AB (ref 6–20)
CHLORIDE: 100 mmol/L — AB (ref 101–111)
CO2: 21 mmol/L — ABNORMAL LOW (ref 22–32)
CREATININE: 1.89 mg/dL — AB (ref 0.44–1.00)
Calcium: 7.9 mg/dL — ABNORMAL LOW (ref 8.9–10.3)
GFR, EST AFRICAN AMERICAN: 29 mL/min — AB (ref >60)
GFR, EST NON AFRICAN AMERICAN: 25 mL/min — AB (ref >60)
Glucose, Bld: 286 mg/dL — ABNORMAL HIGH (ref 65–99)
Potassium: 4.9 mmol/L (ref 3.5–5.1)
SODIUM: 129 mmol/L — AB (ref 135–145)

## 2015-10-01 MED ORDER — INSULIN GLARGINE 100 UNIT/ML ~~LOC~~ SOLN
30.0000 [IU] | Freq: Every day | SUBCUTANEOUS | Status: DC
  Filled 2015-10-01: qty 0.3

## 2015-10-01 NOTE — Progress Notes (Signed)
Family Medicine Teaching Service Daily Progress Note Intern Pager: [phone]  Patient name: Natalie Porter Medical record number: [id number] Date of birth: November 13, 1940 Age: 75 y.o. Gender: female  Primary Care Provider: Cyndy Freeze, MD Consultants: None Code Status: FULL  Pt Overview and Major Events to Date:  7/7 - admitted to [place]; received one dose vanc/cefepime  Assessment and Plan: Natalie Porter is a 75 y.o. female presenting with shortness of breath. PMH is significant for HFpEF, T2DM, stage 3 CKD, COPD, GERD, PVD, CAD, HLD, OSA, anemia, lung CA, osteoarthritis.   #Shortness of breath. Likely CHF exacerbation vs HCAP. Sats in mid 90s on room air, patient feeling better. Past 24 hrs UOP -3.5L .  -- Restart  patient on home dose lasix '80mg'$  -- Monitor respiratory status -- Strict I/Os, daily weights  -- Continue albuterol nebs q4 scheduled.  #AKI in setting of Stage 3 CKD. Cr on admission 2.39, baseline 1.4. This morning (7/11) creatinine is 1.89 down from 2.08 yesterday.  Most likely a mixed picture of cardiorenal d/t signs of volume overload and some prerenal AKI. -- Restart patient on home dose lasix 80 mg qd -- Continue to monitor kidney function   #Chest pain. Likely musculoskeletal vs 2/2 cough because TTP to palpation of chest wall and resolves with lying flat. Unlikely ACS or PE (trop neg x2 and Wells score 1). Persistent but only when coughing this AM. - Continue to monitor - Tylenol PRN pain control  #UTI. Patient continue to deny dysuria or increased urinary frequency. Blood cultures have shown no growth.  -- Completed antibiotic course IV  250 mg azithromycin and '1mg'$  ceftriaxone -- Follow up on urine culture from [facility] -- Blood cultures show no growth to date (48hrs)  #Hyperkalemia, acute Improved with kayexalate down from 5.5 to 4.9.  --Follow on pm BMP    #Constipation. Patient having regular BMs --Docusate --Consider adding  Miralax  #T2DM. On lantus 30U at home. Last a1c 7.0 (07/22/1998). Patient continue to have elevated CBG, Diabetes consultant recs to increase lantus and meals time insulin.  -- Monitor CBGs ACHS -- Increase Lantus to 30 U and SSI -- A1C 6.9 down from 7.0 (5/1)  #HTN. BP 169/54 on admission. 140/46 overnight. BP well controlled with home regimen - Continue home meds norvasc, coreg, ARB - Monitor BP  COPD. Does not currently appear to be in exacerbation, no sputum production. Patient has chronic cough that has been worked up as outpatient and states is not changed from her baseline - Continue home Spiriva  HLD.  - Continue statin  PVD.  - Continue gabapentin  GERD. - Protonix  OSA. - CPAP at night  H/o depression. - Continue home lexapro  FEN/GI: SLIV, heart healthy diet, Protonix Prophylaxis: heparin  Disposition: Pending medical improvement  Subjective:  Patient is feeling better this morning and breathing has improved, feels less confused and is having regular BMs.   Objective: Temp:  [97.4 F (36.3 C)-97.7 F (36.5 C)] 97.7 F (36.5 C) (07/10 2008) Pulse Rate:  [55-65] 65 (07/10 2012) Resp:  [16-18] 16 (07/10 2012) BP: (140-160)/(45-61) 140/46 mmHg (07/10 2008) SpO2:  [94 %-96 %] 95 % (07/10 2012) FiO2 (%):  [21 %] 21 % (07/10 1402) Weight:  [185 lb (83.915 kg)] 185 lb (83.915 kg) (07/11 0352) Physical Exam: General: lying in bed in NAD Cardiovascular: RRR, no murmurs appreciated Respiratory: Moving air clearly no increase work of  breathing; 1L [place] in place;  Abdomen:Soft, non distended, normal BS, no guarding  or rebound. Extremities: trace edema to mid shin bilaterally  Laboratory:  Recent Labs Lab 09/28/15 0326 09/29/15 0757 09/30/15 0941  WBC 19.4* 14.4* 17.0*  HGB 8.9* 8.6* 8.5*  HCT 28.3* 28.4* 25.8*  PLT 174 178 209    Recent Labs Lab 09/27/15 1116  09/30/15 0322 09/30/15 1722 10/01/15 0235  NA 131*  < > 125* 128* 129*  K 6.0*  < >  5.5* 5.2* 4.9  CL 104  < > 99* 99* 100*  CO2 20*  < > 19* 18* 21*  BUN 55*  < > 78* 81* 86*  CREATININE 2.39*  < > 2.33* 2.08* 1.89*  CALCIUM 8.3*  < > 7.7* 8.2* 7.9*  PROT 7.6  --   --   --   --   BILITOT 0.3  --   --   --   --   ALKPHOS 44  --   --   --   --   ALT 12*  --   --   --   --   AST 15  --   --   --   --   GLUCOSE 143*  < > 379* 258* 286*  < > = values in this interval not displayed.  Imaging/Diagnostic Tests: Dg Chest 2 View  09/27/2015  CLINICAL DATA:  Shortness of breath, history hypertension, diabetes mellitus, lung cancer EXAM: CHEST  2 VIEW COMPARISON:  09/16/2015 ; correlation PET-CT 08/07/2015, CT chest 07/29/2015 FINDINGS: Enlargement of cardiac silhouette. Atherosclerotic calcification aorta. Stable mediastinal contours. Large soft tissue opacity/mass LEFT upper lobe compatible with tumor. Bronchitic changes with mild atelectasis at RIGHT base. Additional opacity at the RIGHT base is somewhat more focal; this corresponds with area of infiltrate and underlying non nodule at RIGHT base by prior imaging. Remaining lungs clear. Small RIGHT pleural effusion. No pneumothorax. Bones demineralized. IMPRESSION: Large LEFT upper lobe mass. Infiltrate versus atelectasis and known underlying lung nodule at RIGHT lung base with small RIGHT pleural effusion. Enlargement of cardiac silhouette. Aortic atherosclerosis. Electronically Signed   By: Lavonia Dana M.D.   On: 09/27/2015 12:23    Marjie Skiff, MD 10/01/2015, 8:53 AM PGY-1, [place] Intern pager: [phone], text pages welcome

## 2015-10-01 NOTE — Progress Notes (Signed)
Pt has orders to be discharged. Discharge instructions given and pt has no additional questions at this time. Medication regimen reviewed and pt educated. Pt verbalized understanding and has no additional questions. Telemetry box removed. IV removed and site in good condition. Pt stable and waiting for transportation. 

## 2015-10-03 LAB — CULTURE, BLOOD (ROUTINE X 2)
CULTURE: NO GROWTH
CULTURE: NO GROWTH

## 2015-10-03 NOTE — Discharge Summary (Signed)
[facility] Discharge Summary  Patient name: Natalie Porter Medical record number: [id number] Date of birth: 04/25/1940 Age: 75 y.o. Gender: female Date of Admission: 09/27/2015  Date of Discharge: 10/01/2015 Admitting Physician: Cletus Gash, MD  Primary Care Provider: Cyndy Freeze, MD Consultants: None  Indication for Hospitalization: Shortness of breath  Discharge Diagnoses/Problem List:  Shortness of breath secondary to CHF exacerbation  Disposition: Home  Discharge Condition: Stable  Discharge Exam:  General: lying in bed in NAD Cardiovascular: RRR, no murmurs appreciated, normal S1 and S2 Respiratory: Moving air clearly no increase work of breathing, normal work of breathing, no wheezes, no crakles, Abdomen:Soft, non distended, normal BS, no guarding or rebound. Extremities: trace edema to mid shin bilaterally,   Brief Hospital Course:  Natalie Porter is a 75 y.o. female presenting with shortness of breath. PMH is significant for HFpEF, T2DM, stage 3 CKD, COPD, GERD, PVD, CAD, HLD, OSA, anemia, lung CA, osteoarthritis.   #Shortness of breath. Likely CHF exacerbation vs HCAP. On discharge day patient's oxygen saturation are in the mid 90s on room air. Net 5.5L output with diuresis throughout hospitalization. -- Restarted patient on home dose lasix '80mg'$  po qd -- Restart Spiriva 18 mcg inhalation capsule  #AKI in setting of Stage 3 CKD. Cr on admission 2.39, baseline 1.4. On discharge day creatinine is 1.89 down. Most likely a mixed picture of cardiorenal d/t signs of volume overload and some prerenal AKI. -- Restart patient on home dose lasix 80 mg qd   #Chest pain. Likely musculoskeletal vs 2/2 cough because TTP to palpation of chest wall and resolves with lying flat. Unlikely ACS or PE (trop neg x2 and Wells score 1).  - Tylenol PRN pain control  #UTI. Patient continue to deny dysuria or increased urinary frequency.  Blood cultures have shown no growth.  -- Completed antibiotic course IV 250 mg azithromycin and '1mg'$  ceftriaxone -- Follow up on urine culture from [facility] -- Blood cultures show no growth to date (48hrs)  #Hyperkalemia, acute Improved with kayexalate down from 5.5 to 4.9.    #Constipation. Patient having regular BMs. --Docusate  #T2DM. On lantus 30U at home. Last a1c 7.0 (07/22/1998). Patient continue to have elevated CBG, Lantus was gradually increase while inpatient  As well as meals time insulin.  -- Increase Lantus to 30 U and SSI -- A1C 6.9 down from 7.0 (5/1)  #HTN. BP 169/54 on admission.  BP well controlled with home regimen - Continue home meds norvasc, coreg, ARB  COPD. Does not currently appear to be in exacerbation, no sputum production. Patient has chronic cough that has been worked up as outpatient and states is not changed from her baseline - Continue home Spiriva  HLD.  - Continue statin  PVD.  - Continue gabapentin  GERD. - Protonix  OSA. - CPAP at night  H/o depression. - Continue home lexapro  Issues for Follow Up:  1. Titrate insulin regimen for better glycemic control  Significant Procedures: None  Significant Labs and Imaging:   Recent Labs Lab 09/29/15 0757 09/30/15 0941 10/01/15 1110  WBC 14.4* 17.0* 13.3*  HGB 8.6* 8.5* 8.8*  HCT 28.4* 25.8* 26.8*  PLT 178 209 234    Recent Labs Lab 09/27/15 1116 09/28/15 0326 09/29/15 0757 09/30/15 0322 09/30/15 1722 10/01/15 0235  NA 131* 131* 131* 125* 128* 129*  K 6.0* 5.6* 5.2* 5.5* 5.2* 4.9  CL 104 104 103 99* 99* 100*  CO2 20* 20* 21* 19* 18* 21*  GLUCOSE 143* 126* 246* 379* 258* 286*  BUN 55* 59* 72* 78* 81* 86*  CREATININE 2.39* 2.42* 2.51* 2.33* 2.08* 1.89*  CALCIUM 8.3* 8.2* 8.3* 7.7* 8.2* 7.9*  MG 2.5*  --   --   --   --   --   ALKPHOS 44  --   --   --   --   --   AST 15  --   --   --   --   --   ALT 12*  --   --   --   --   --   ALBUMIN 2.4*  --   --   --   --   --    Dg Chest 2 View  09/27/2015  CLINICAL DATA:  Shortness of breath, history hypertension, diabetes mellitus, lung cancer EXAM: CHEST  2 VIEW COMPARISON:  09/16/2015 ; correlation PET-CT 08/07/2015, CT chest 07/29/2015 FINDINGS: Enlargement of cardiac silhouette. Atherosclerotic calcification aorta. Stable mediastinal contours. Large soft tissue opacity/mass LEFT upper lobe compatible with tumor. Bronchitic changes with mild atelectasis at RIGHT base. Additional opacity at the RIGHT base is somewhat more focal; this corresponds with area of infiltrate and underlying non nodule at RIGHT base by prior imaging. Remaining lungs clear. Small RIGHT pleural effusion. No pneumothorax. Bones demineralized. IMPRESSION: Large LEFT upper lobe mass. Infiltrate versus atelectasis and known underlying lung nodule at RIGHT lung base with small RIGHT pleural effusion. Enlargement of cardiac silhouette. Aortic atherosclerosis. Electronically Signed   By: Lavonia Dana M.D.   On: 09/27/2015 12:23   Ct Biopsy  09/16/2015  CLINICAL DATA:  Enlarging hypermetabolic left upper lobe lung mass EXAM: CT GUIDED CORE BIOPSY OF LEFT UPPER LOBE LUNG LESION ANESTHESIA/SEDATION: Intravenous Fentanyl and Versed were administered as conscious sedation during continuous monitoring of the patient's level of consciousness and physiological / cardiorespiratory status by the radiology RN, with a total moderate sedation time of 10 minutes. PROCEDURE: The procedure risks, benefits, and alternatives were explained to the patient. Questions regarding the procedure were encouraged and answered. The patient understands and consents to the procedure. Patient placed prone. Select axial scans through the thorax were obtained. The left upper lobe lesion was localized and an appropriate skin entry site was determined and marked. The operative field was prepped with chlorhexidinein a sterile fashion, and a sterile drape was applied covering the operative field. A  sterile gown and sterile gloves were used for the procedure. Local anesthesia was provided with 1% Lidocaine. Under CT fluoroscopic guidance, a 17 gauge trocar needle was advanced to the margin of the lesion. Once needle tip position was confirmed, coaxial 18-gauge core biopsy samples were obtained, submitted in formalin to surgical pathology. The guide needle was removed. Postprocedure scans reveal no pneumothorax or regional alveolar hemorrhage. The patient tolerated the procedure well. COMPLICATIONS: None immediate FINDINGS: Large posterior left upper lobe mass was again localized. An adjacent metallic foreign body was again noted. Percutaneous core biopsies of the mass were obtained. IMPRESSION: 1. Technically successful CT-guided core biopsy of left upper lobe lung mass. Observation and follow-up chest radiography is scheduled. Electronically Signed   By: Lucrezia Europe M.D.   On: 09/16/2015 15:57   Dg Chest Port 1 View  09/16/2015  CLINICAL DATA:  Status post lung biopsy EXAM: PORTABLE CHEST 1 VIEW COMPARISON:  None. FINDINGS: Cardiac shadow remains enlarged. A large left apical mass lesion is noted. No post biopsy pneumothorax is noted. No acute bony abnormality is seen. Some nodular density is noted  in the right lung base which is likely sequela from the previous right basilar infiltrate. No bony abnormality is seen. IMPRESSION: No evidence of post biopsy pneumothorax. Electronically Signed   By: Inez Catalina M.D.   On: 09/16/2015 13:21    Results/Tests Pending at Time of Discharge:   Discharge Medications:    Medication List    STOP taking these medications        chlorthalidone 50 MG tablet  Commonly known as:  HYGROTON     ciprofloxacin 500 MG tablet  Commonly known as:  CIPRO     valsartan 80 MG tablet  Commonly known as:  DIOVAN      TAKE these medications        acetaminophen-codeine 300-30 MG tablet  Commonly known as:  TYLENOL #3  Take 1 tablet by mouth 2 (two) times daily as  needed for moderate pain.     amLODipine 10 MG tablet  Commonly known as:  NORVASC  Take 10 mg by mouth daily.     aspirin EC 81 MG tablet  Take 81 mg by mouth daily.     atorvastatin 80 MG tablet  Commonly known as:  LIPITOR  Take 80 mg by mouth daily.     CALCIUM-CARB 600 PO  Take 600 mg by mouth 2 (two) times daily.     carvedilol 25 MG tablet  Commonly known as:  COREG  Take 25 mg by mouth 2 (two) times daily with a meal.     dicyclomine 20 MG tablet  Commonly known as:  BENTYL  Take 20 mg by mouth every 8 (eight) hours as needed. Reported on 09/27/2015     escitalopram 20 MG tablet  Commonly known as:  LEXAPRO  Take 20 mg by mouth daily.     fluticasone 50 MCG/ACT nasal spray  Commonly known as:  FLONASE  Place 2 sprays into both nostrils daily.     furosemide 80 MG tablet  Commonly known as:  LASIX  TAKE 1 TABLET DAILY     gabapentin 100 MG capsule  Commonly known as:  NEURONTIN  Take 200-300 mg by mouth 2 (two) times daily. 200 mg in the morning and 300 mg at bedtime     insulin glargine 100 UNIT/ML injection  Commonly known as:  LANTUS  Inject 30 Units into the skin at bedtime.     omeprazole 20 MG capsule  Commonly known as:  PRILOSEC  Take 20 mg by mouth daily.     tiotropium 18 MCG inhalation capsule  Commonly known as:  SPIRIVA  Place 18 mcg into inhaler and inhale daily.        Discharge Instructions: Please refer to Patient Instructions section of EMR for full details.  Patient was counseled important signs and symptoms that should prompt return to medical care, changes in medications, dietary instructions, activity restrictions, and follow up appointments.   Follow-Up Appointments:     Follow-up Information    Follow up with Cyndy Freeze, MD On 10/09/2015.   Specialty:  Family Medicine   Why:  At 11:10am for hospital follow up   Contact information:   [place] [id number] [phone]       Marjie Skiff,  MD 10/03/2015, 11:56 PM PGY-1, [place]

## 2015-10-08 ENCOUNTER — Ambulatory Visit
Admission: RE | Admit: 2015-10-08 | Discharge: 2015-10-08 | Disposition: A | Discharge status: Home / Self Care | Payer: Medicare Other | Source: Ambulatory Visit | Attending: Radiation Oncology | Admitting: Radiation Oncology

## 2015-10-08 DIAGNOSIS — C3412 Malignant neoplasm of upper lobe, left bronchus or lung: Secondary | ICD-10-CM

## 2015-10-08 DIAGNOSIS — Z510 Encounter for antineoplastic radiation therapy: Secondary | ICD-10-CM | POA: Diagnosis not present

## 2015-10-08 DIAGNOSIS — C3431 Malignant neoplasm of lower lobe, right bronchus or lung: Secondary | ICD-10-CM | POA: Diagnosis not present

## 2015-10-08 NOTE — Progress Notes (Signed)
  Radiation Oncology         [phone] ________________________________  Name: Natalie Porter MRN: [id number]  Date: 10/08/2015  DOB: 1940-11-02  STEREOTACTIC BODY RADIOTHERAPY SIMULATION AND TREATMENT PLANNING NOTE    ICD-9-CM ICD-10-CM   1. 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  Identity was confirmed.  All relevant records and images related to the planned course of therapy were reviewed.  The patient freely provided informed written consent to proceed with treatment after reviewing the details related to the planned course of therapy. The consent form was witnessed and verified by the simulation staff.  Then, the patient was set-up in a stable reproducible  supine position for radiation therapy.  A BodyFix immobilization pillow was fabricated for reproducible positioning.  Then I personally applied the abdominal compression paddle to limit respiratory excursion.  4D respiratoy motion management CT images were obtained.  Surface markings were placed.  The CT images were loaded into the planning software.  Then, using Cine, MIP, and standard views, the internal target volume (ITV) and planning target volumes (PTV) were delinieated, and avoidance structures were contoured.  Treatment planning then occurred.  The radiation prescription was entered and confirmed.  A total of two complex treatment devices were fabricated in the form of the BodyFix immobilization pillow and a neck accuform cushion.  I have requested : 3D Simulation  I have requested a DVH of the following structures: Heart, Lungs, Esophagus, Chest Wall, Brachial Plexus, Major Blood Vessels, and targets.  SPECIAL TREATMENT PROCEDURE:  The planned course of therapy using radiation constitutes a special treatment procedure. Special care is  required in the management of this patient for the following reasons. This treatment constitutes a Special Treatment Procedure for the following reason: [ High dose per fraction requiring special monitoring for increased toxicities of treatment including daily imaging..  The special nature of the planned course of radiotherapy will require increased physician supervision and oversight to ensure patient's safety with optimal treatment outcomes.  RESPIRATORY MOTION MANAGEMENT SIMULATION:  In order to account for effect of respiratory motion on target structures and other organs in the planning and delivery of radiotherapy, this patient underwent respiratory motion management simulation.  To accomplish this, when the patient was brought to the CT simulation planning suite, 4D respiratoy motion management CT images were obtained.  The CT images were loaded into the planning software.  Then, using a variety of tools including Cine, MIP, and standard views, the target volume and planning target volumes (PTV) were delineated.  Avoidance structures were contoured.  Treatment planning then occurred.  Dose volume histograms were generated and reviewed for each of the requested structure.  The resulting plan was carefully reviewed and approved today.  PLAN:  The patient will receive 50 Gy in 10 fractions. Patient is not a candidate for conventional SBRT in light of chest wall constraints.  ________________________________  Blair Promise, PhD, MD    This document serves as a record of services personally performed by Gery Pray, MD. It was created on his behalf by Lendon Collar, a trained medical scribe. The creation of this record is based on the scribe's personal observations and the provider's statements to them. This document has been checked and approved by the attending provider.

## 2015-10-09 ENCOUNTER — Ambulatory Visit (INDEPENDENT_AMBULATORY_CARE_PROVIDER_SITE_OTHER): Payer: Medicare Other | Admitting: Student in an Organized Health Care Education/Training Program

## 2015-10-09 ENCOUNTER — Encounter: Payer: Self-pay | Admitting: Student in an Organized Health Care Education/Training Program

## 2015-10-09 VITALS — BP 130/65 | HR 60 | Temp 98.3°F | Wt 176.0 lb

## 2015-10-09 DIAGNOSIS — I5033 Acute on chronic diastolic (congestive) heart failure: Secondary | ICD-10-CM

## 2015-10-09 DIAGNOSIS — I2510 Atherosclerotic heart disease of native coronary artery without angina pectoris: Secondary | ICD-10-CM | POA: Diagnosis not present

## 2015-10-09 DIAGNOSIS — Z09 Encounter for follow-up examination after completed treatment for conditions other than malignant neoplasm: Secondary | ICD-10-CM | POA: Diagnosis not present

## 2015-10-09 DIAGNOSIS — N183 Chronic kidney disease, stage 3 unspecified: Secondary | ICD-10-CM

## 2015-10-09 DIAGNOSIS — E875 Hyperkalemia: Secondary | ICD-10-CM

## 2015-10-09 DIAGNOSIS — Z510 Encounter for antineoplastic radiation therapy: Secondary | ICD-10-CM | POA: Diagnosis not present

## 2015-10-09 DIAGNOSIS — R0600 Dyspnea, unspecified: Secondary | ICD-10-CM | POA: Diagnosis not present

## 2015-10-09 DIAGNOSIS — C3431 Malignant neoplasm of lower lobe, right bronchus or lung: Secondary | ICD-10-CM | POA: Diagnosis not present

## 2015-10-09 DIAGNOSIS — E1122 Type 2 diabetes mellitus with diabetic chronic kidney disease: Secondary | ICD-10-CM

## 2015-10-09 DIAGNOSIS — C3412 Malignant neoplasm of upper lobe, left bronchus or lung: Secondary | ICD-10-CM | POA: Insufficient documentation

## 2015-10-09 DIAGNOSIS — N390 Urinary tract infection, site not specified: Secondary | ICD-10-CM

## 2015-10-09 DIAGNOSIS — J189 Pneumonia, unspecified organism: Secondary | ICD-10-CM | POA: Diagnosis not present

## 2015-10-09 DIAGNOSIS — K5900 Constipation, unspecified: Secondary | ICD-10-CM | POA: Insufficient documentation

## 2015-10-09 LAB — CBC
HEMATOCRIT: 32.6 % — AB (ref 35.0–45.0)
HEMOGLOBIN: 10.4 g/dL — AB (ref 11.7–15.5)
MCH: 28.2 pg (ref 27.0–33.0)
MCHC: 31.9 g/dL — AB (ref 32.0–36.0)
MCV: 88.3 fL (ref 80.0–100.0)
MPV: 9.5 fL (ref 7.5–12.5)
Platelets: 292 10*3/uL (ref 140–400)
RBC: 3.69 MIL/uL — ABNORMAL LOW (ref 3.80–5.10)
RDW: 14.9 % (ref 11.0–15.0)
WBC: 10.8 10*3/uL (ref 3.8–10.8)

## 2015-10-09 NOTE — Assessment & Plan Note (Signed)
Resolved.  - Antibiotics completed in the hospital. - follow up WBC on CBC today

## 2015-10-09 NOTE — Assessment & Plan Note (Addendum)
Stable. - A1C recently 6.9 on 09/28/2015 - elevated CBG noted in hospital, however improved since discharge - Home CBG ~130, patient checks regularly - Continue previous regimen 30u Lantus qhs

## 2015-10-09 NOTE — Assessment & Plan Note (Signed)
Improved s/p exacerbation and hospitalization for management. - no signs of volume overload, weight improved - continue lasix - patient measures daily weights - will follow up in 2-3 weeks

## 2015-10-09 NOTE — Assessment & Plan Note (Addendum)
Patient noted to have AKI in the setting of CKD in the hospital with elevated Cr and K+.  Home Lasix was restarted upon discharge. - Cr improved at  1.89 and potassium improved at 4.9 at hospital discharge on 10/01/2015, will follow up the trend with BMP today

## 2015-10-09 NOTE — Progress Notes (Signed)
   CC: hospital follow up  HPI: Natalie Porter is a 75 y.o. female who presents to [facility] today for follow-up after hospitalization for CHF exacerbation after a previous UTI.   In the hospital she was also followed for AKI as well as hyperkalemia. Patient was discharged on previous home medication regimen.   CHF Exacerbation Patient reports feeling much better after hospital discharge.  She feels her breathing and activity level are much improved. She denies dyspnea or chest pain.   Diabetes Her blood sugars were noted to be high in the hospital, however the patient reports that they have improved since discharge and are now ~130 on average at home.    Review of Symptoms: See HPI for ROS.   CC, SH/smoking status, and VS noted.  Objective: BP 130/65 mmHg  Pulse 60  Temp(Src) 98.3 F (36.8 C) (Oral)  Wt 176 lb (79.833 kg)  SpO2 95% GEN: NAD, alert, cooperative, and pleasant. NECK: full ROM RESPIRATORY: clear to auscultation bilaterally with no wheezes, rhonchi or rales, good effort CV: RRR, no peripheral edema, +diastolic flow murmer, no rubs or gallops GI: soft, non-tender, non-distended, normoactive bowel sounds, no hepatosplenomegaly SKIN: warm and dry, no rashes or lesions PSYCH: appropriate affect, linear thought process, AAOx3  Assessment and plan:  Diabetes mellitus, type 2 Stable. - A1C recently 6.9 on 09/28/2015 - elevated CBG noted in hospital, however improved since discharge - Home CBG ~130, patient checks regularly - Continue previous regimen 30u Lantus qhs  Chronic kidney disease (CKD), stage III (moderate) Patient noted to have AKI in the setting of CKD in the hospital with elevated Cr and K+.  Home Lasix was restarted upon discharge. - Cr improved at  1.89 and potassium improved at 4.9 at hospital discharge on 10/01/2015, will follow up the trend with BMP today    Diastolic CHF, acute on chronic Improved s/p exacerbation and hospitalization for  management. - no signs of volume overload, weight improved - continue lasix - patient measures daily weights - will follow up in 2-3 weeks  UTI (urinary tract infection) Resolved.  - Antibiotics completed in the hospital. - follow up WBC on CBC today    Orders Placed This Encounter  Procedures  . CBC  . Basic Metabolic Panel    No orders of the defined types were placed in this encounter.     Everrett Coombe, MD,MS,  PGY1 10/09/2015 8:12 PM

## 2015-10-09 NOTE — Patient Instructions (Signed)
It was a pleasure meeting you today in our clinic. Today we discussed your hospital follow up.  I am happy to hear you are feeling better.  - We will continue your current medications as prescribed - Continue including activity such as walking into your day - Follow up in our office in 2-3 weeks  Our clinic's number is [phone]. Feel free to call with any questions or concerns about what we discussed today.   - Dr. Burr Medico

## 2015-10-10 ENCOUNTER — Telehealth: Payer: Self-pay | Admitting: Student in an Organized Health Care Education/Training Program

## 2015-10-10 ENCOUNTER — Other Ambulatory Visit: Payer: Self-pay | Admitting: Student in an Organized Health Care Education/Training Program

## 2015-10-10 DIAGNOSIS — E875 Hyperkalemia: Secondary | ICD-10-CM

## 2015-10-10 LAB — BASIC METABOLIC PANEL
BUN: 33 mg/dL — AB (ref 7–25)
CALCIUM: 8.2 mg/dL — AB (ref 8.6–10.4)
CO2: 21 mmol/L (ref 20–31)
Chloride: 105 mmol/L (ref 98–110)
Creat: 1.34 mg/dL — ABNORMAL HIGH (ref 0.60–0.93)
GLUCOSE: 124 mg/dL — AB (ref 65–99)
Potassium: 5.5 mmol/L — ABNORMAL HIGH (ref 3.5–5.3)
Sodium: 136 mmol/L (ref 135–146)

## 2015-10-10 NOTE — Telephone Encounter (Signed)
Called and spoke with patient's daughter who handles her healthcare. Informed her of blood test results.  Let her know that potassium level had trended up slightly from hospital discharge but kidney function improving.  Recommended that she decrease foods rich in potassium, take plenty of fluid, and call back to make a lab appointment next week to recheck BMP. Everrett Coombe, MD

## 2015-10-15 ENCOUNTER — Ambulatory Visit: Payer: Medicare Other | Admitting: Radiation Oncology

## 2015-10-16 ENCOUNTER — Ambulatory Visit: Payer: Medicare Other

## 2015-10-17 ENCOUNTER — Ambulatory Visit: Payer: Medicare Other

## 2015-10-17 ENCOUNTER — Other Ambulatory Visit: Payer: Medicare Other

## 2015-10-17 ENCOUNTER — Ambulatory Visit
Admission: RE | Admit: 2015-10-17 | Discharge: 2015-10-17 | Disposition: A | Discharge status: Home / Self Care | Payer: Medicare Other | Source: Ambulatory Visit | Attending: Radiation Oncology | Admitting: Radiation Oncology

## 2015-10-17 DIAGNOSIS — C3412 Malignant neoplasm of upper lobe, left bronchus or lung: Secondary | ICD-10-CM

## 2015-10-17 DIAGNOSIS — E875 Hyperkalemia: Secondary | ICD-10-CM

## 2015-10-17 DIAGNOSIS — Z510 Encounter for antineoplastic radiation therapy: Secondary | ICD-10-CM | POA: Diagnosis not present

## 2015-10-17 DIAGNOSIS — C3431 Malignant neoplasm of lower lobe, right bronchus or lung: Secondary | ICD-10-CM | POA: Diagnosis not present

## 2015-10-17 NOTE — Progress Notes (Deleted)
  Radiation Oncology         [phone] ________________________________  Name: Natalie Porter MRN: [id number]  Date: 10/17/2015  DOB: Jul 25, 1940  Stereotactic Body Radiotherapy Treatment Procedure Note  NARRATIVE:  Natalie Porter was brought to the stereotactic radiation treatment machine and placed supine on the CT couch. The patient was set up for stereotactic body radiotherapy on the body fix pillow.  3D TREATMENT PLANNING AND DOSIMETRY:  The patient's radiation plan was reviewed and approved prior to starting treatment.  It showed 3-dimensional radiation distributions overlaid onto the planning CT.  [facility] for the target structures as well as the organs at risk were reviewed. The documentation of this is filed in the radiation oncology EMR.  SIMULATION VERIFICATION:  The patient underwent CT imaging on the treatment unit.  These were carefully aligned to document that the ablative radiation dose would cover the target volume and maximally spare the nearby organs at risk according to the planned distribution.  SPECIAL TREATMENT PROCEDURE: Natalie Porter received high dose ablative stereotactic body radiotherapy to the planned target volume without unforeseen complications. Treatment was delivered uneventfully. The high doses associated with stereotactic body radiotherapy and the significant potential risks require careful treatment set up and patient monitoring constituting a special treatment procedure   STEREOTACTIC TREATMENT MANAGEMENT:  Following delivery, the patient was evaluated clinically. The patient tolerated treatment without significant acute effects, and was discharged to home in stable condition.    PLAN: Continue treatment as planned.  ________________________________  Blair Promise, PhD, MD

## 2015-10-17 NOTE — Therapy (Signed)
  Radiation Oncology         [phone]) [phone] ________________________________  Name: Natalie Porter MRN: [id number]  Date: 10/17/2015  DOB: Jul 15, 1940  Simulation Verification Note    ICD-9-CM ICD-10-CM   1. Primary cancer of left upper lobe of lung (HCC) 162.3 C34.12     Status: outpatient  NARRATIVE: The patient was brought to the treatment unit and placed in the planned treatment position. The clinical setup was verified. Then port films were obtained and uploaded to the radiation oncology medical record software.  The treatment beams were carefully compared against the planned radiation fields. The position location and shape of the radiation fields was reviewed. They targeted volume of tissue appears to be appropriately covered by the radiation beams. Organs at risk appear to be excluded as planned.  Based on my personal review, I approved the simulation verification. The patient's treatment will proceed as planned.  -----------------------------------  Blair Promise, PhD, MD

## 2015-10-18 ENCOUNTER — Ambulatory Visit
Admission: RE | Admit: 2015-10-18 | Discharge: 2015-10-18 | Disposition: A | Discharge status: Home / Self Care | Payer: Medicare Other | Source: Ambulatory Visit | Attending: Radiation Oncology | Admitting: Radiation Oncology

## 2015-10-18 ENCOUNTER — Telehealth: Payer: Self-pay | Admitting: Family Medicine

## 2015-10-18 DIAGNOSIS — C3431 Malignant neoplasm of lower lobe, right bronchus or lung: Secondary | ICD-10-CM | POA: Diagnosis not present

## 2015-10-18 DIAGNOSIS — C3412 Malignant neoplasm of upper lobe, left bronchus or lung: Secondary | ICD-10-CM | POA: Diagnosis not present

## 2015-10-18 DIAGNOSIS — Z510 Encounter for antineoplastic radiation therapy: Secondary | ICD-10-CM | POA: Diagnosis not present

## 2015-10-18 LAB — BASIC METABOLIC PANEL
BUN: 45 mg/dL — ABNORMAL HIGH (ref 7–25)
CHLORIDE: 107 mmol/L (ref 98–110)
CO2: 20 mmol/L (ref 20–31)
Calcium: 8.5 mg/dL — ABNORMAL LOW (ref 8.6–10.4)
Creat: 1.53 mg/dL — ABNORMAL HIGH (ref 0.60–0.93)
GLUCOSE: 149 mg/dL — AB (ref 65–99)
POTASSIUM: 5.9 mmol/L — AB (ref 3.5–5.3)
SODIUM: 135 mmol/L (ref 135–146)

## 2015-10-18 NOTE — Telephone Encounter (Signed)
Patient's mobile phone was contacted and caretaker Horris Latino) answered. Was concerned about a slight increase in potassium 5.9 from BMP yesterday and a slight increase in creatinine. Horris Latino informed me she received radiation yesterday but was asymptomatic. She has a scheduled appointment on Friday.

## 2015-10-21 ENCOUNTER — Ambulatory Visit
Admission: RE | Admit: 2015-10-21 | Discharge: 2015-10-21 | Disposition: A | Discharge status: Home / Self Care | Payer: Medicare Other | Source: Ambulatory Visit | Attending: Radiation Oncology | Admitting: Radiation Oncology

## 2015-10-21 DIAGNOSIS — Z510 Encounter for antineoplastic radiation therapy: Secondary | ICD-10-CM | POA: Diagnosis not present

## 2015-10-21 DIAGNOSIS — C3431 Malignant neoplasm of lower lobe, right bronchus or lung: Secondary | ICD-10-CM | POA: Diagnosis not present

## 2015-10-21 DIAGNOSIS — C3412 Malignant neoplasm of upper lobe, left bronchus or lung: Secondary | ICD-10-CM | POA: Diagnosis not present

## 2015-10-22 ENCOUNTER — Ambulatory Visit
Admission: RE | Admit: 2015-10-22 | Discharge: 2015-10-22 | Disposition: A | Discharge status: Home / Self Care | Payer: Medicare Other | Source: Ambulatory Visit | Attending: Radiation Oncology | Admitting: Radiation Oncology

## 2015-10-22 ENCOUNTER — Encounter: Payer: Self-pay | Admitting: Radiation Oncology

## 2015-10-22 VITALS — BP 147/50 | HR 52 | Temp 97.6°F | Ht 62.0 in | Wt 174.0 lb

## 2015-10-22 DIAGNOSIS — H34813 Central retinal vein occlusion, bilateral, with macular edema: Secondary | ICD-10-CM | POA: Diagnosis not present

## 2015-10-22 DIAGNOSIS — Z510 Encounter for antineoplastic radiation therapy: Secondary | ICD-10-CM | POA: Diagnosis not present

## 2015-10-22 DIAGNOSIS — C3431 Malignant neoplasm of lower lobe, right bronchus or lung: Secondary | ICD-10-CM | POA: Diagnosis not present

## 2015-10-22 DIAGNOSIS — C3412 Malignant neoplasm of upper lobe, left bronchus or lung: Secondary | ICD-10-CM | POA: Diagnosis not present

## 2015-10-22 MED ORDER — SONAFINE EX EMUL
1.0000 "application " | Freq: Once | CUTANEOUS | Status: AC
  Administered 2015-10-22: 1 via TOPICAL

## 2015-10-22 NOTE — Progress Notes (Signed)
Natalie Porter has completed 4 fractions to her left upper lung.  She denies pain.  She reports when she takes a deep breath she can "feel something going on.  She said it started yesterday and said she occasionally has the same feeling on the right side.  She denies having a cough.  She denies having shortness of breath.  She reports having fatigue. Reviewed post sim education side effects including fatigue, skin irritation and cough.  She had been given sonafine cream.  BP (!) 147/50 (BP Location: Right Wrist, Patient Position: Sitting)   Pulse (!) 52   Temp 97.6 F (36.4 C) (Oral)   Ht '5\' 2"'$  (1.575 m)   Wt 174 lb (78.9 kg)   SpO2 100%   BMI 31.83 kg/m    Wt Readings from Last 3 Encounters:  10/22/15 174 lb (78.9 kg)  10/09/15 176 lb (79.8 kg)  10/01/15 185 lb (83.9 kg)

## 2015-10-22 NOTE — Progress Notes (Signed)
  Radiation Oncology         (336) [phone] ________________________________  Name: Natalie Porter MRN: [id number]  Date: 10/22/2015  DOB: Aug 15, 1940  Weekly Radiation Therapy Management    ICD-9-CM ICD-10-CM   1. Primary cancer of left upper lobe of lung (HCC) 162.3 C34.12 SONAFINE emulsion 1 application     Current Dose: 20 Gy     Planned Dose:  50 Gy  Narrative . . . . . . . . The patient presents for routine under treatment assessment.                     [name] has completed 4 fractions to her left upper lung.  She denies pain.  She reports when she takes a deep breath she can "feel something going on."  She said it started yesterday and said she occasionally has the same feeling on the right side.  She denies having a cough or shortness of breath.  She reports having fatigue. She was given sonafine cream by the nurse.                                 Set-up films were reviewed.                                 The chart was checked. Physical Findings. . .  height is '5\' 2"'$  (1.575 m) and weight is 174 lb (78.9 kg). Her oral temperature is 97.6 F (36.4 C). Her blood pressure is 147/50 (abnormal) and her pulse is 52 (abnormal). Her oxygen saturation is 100%. . Lungs are clear to auscultation bilaterally. Heart has regular rate and rhythm. No palpable cervical, supraclavicular, or axillary adenopathy. Impression . . . . . . . The patient is tolerating radiation. Plan . . . . . . . . . . . . Continue treatment as planned.  ________________________________   Blair Promise, PhD, MD  This document serves as a record of services personally performed by Gery Pray, MD. It was created on his behalf by Darcus Austin, a trained medical scribe. The creation of this record is based on the scribe's personal observations and the provider's statements to them. This document has been checked and approved by the attending provider.

## 2015-10-22 NOTE — Progress Notes (Signed)
  Radiation Oncology         [phone]) [phone] ________________________________  Name: Natalie Porter MRN: [id number]  Date: 10/22/2015  DOB: 26-Apr-1940  Stereotactic Body Radiotherapy Treatment Procedure Note  NARRATIVE:  Natalie Porter was brought to the stereotactic radiation treatment machine and placed supine on the CT couch. The patient was set up for stereotactic body radiotherapy on the body fix pillow.  3D TREATMENT PLANNING AND DOSIMETRY:  The patient's radiation plan was reviewed and approved prior to starting treatment.  It showed 3-dimensional radiation distributions overlaid onto the planning CT.  [facility] for the target structures as well as the organs at risk were reviewed. The documentation of this is filed in the radiation oncology EMR.  SIMULATION VERIFICATION:  The patient underwent CT imaging on the treatment unit.  These were carefully aligned to document that the ablative radiation dose would cover the target volume and maximally spare the nearby organs at risk according to the planned distribution.  SPECIAL TREATMENT PROCEDURE: Natalie Porter received high dose ablative stereotactic body radiotherapy to the planned target volume without unforeseen complications. Treatment was delivered uneventfully. The high doses associated with stereotactic body radiotherapy and the significant potential risks require careful treatment set up and patient monitoring constituting a special treatment procedure   STEREOTACTIC TREATMENT MANAGEMENT:  Following delivery, the patient was evaluated clinically. The patient tolerated treatment without significant acute effects, and was discharged to home in stable condition.    PLAN: Continue treatment as planned.  ________________________________  Blair Promise, PhD, MD

## 2015-10-23 ENCOUNTER — Ambulatory Visit
Admission: RE | Admit: 2015-10-23 | Discharge: 2015-10-23 | Disposition: A | Discharge status: Home / Self Care | Payer: Medicare Other | Source: Ambulatory Visit | Attending: Radiation Oncology | Admitting: Radiation Oncology

## 2015-10-23 DIAGNOSIS — C3412 Malignant neoplasm of upper lobe, left bronchus or lung: Secondary | ICD-10-CM | POA: Diagnosis not present

## 2015-10-23 DIAGNOSIS — Z510 Encounter for antineoplastic radiation therapy: Secondary | ICD-10-CM | POA: Diagnosis not present

## 2015-10-23 DIAGNOSIS — C3431 Malignant neoplasm of lower lobe, right bronchus or lung: Secondary | ICD-10-CM | POA: Diagnosis not present

## 2015-10-24 ENCOUNTER — Ambulatory Visit
Admission: RE | Admit: 2015-10-24 | Discharge: 2015-10-24 | Disposition: A | Discharge status: Home / Self Care | Payer: Medicare Other | Source: Ambulatory Visit | Attending: Radiation Oncology | Admitting: Radiation Oncology

## 2015-10-24 DIAGNOSIS — C3431 Malignant neoplasm of lower lobe, right bronchus or lung: Secondary | ICD-10-CM | POA: Diagnosis not present

## 2015-10-24 DIAGNOSIS — C3412 Malignant neoplasm of upper lobe, left bronchus or lung: Secondary | ICD-10-CM | POA: Diagnosis not present

## 2015-10-24 DIAGNOSIS — Z510 Encounter for antineoplastic radiation therapy: Secondary | ICD-10-CM | POA: Diagnosis not present

## 2015-10-24 DIAGNOSIS — H34813 Central retinal vein occlusion, bilateral, with macular edema: Secondary | ICD-10-CM | POA: Diagnosis not present

## 2015-10-25 ENCOUNTER — Ambulatory Visit (INDEPENDENT_AMBULATORY_CARE_PROVIDER_SITE_OTHER): Payer: Medicare Other | Admitting: Student in an Organized Health Care Education/Training Program

## 2015-10-25 ENCOUNTER — Ambulatory Visit: Payer: Medicare Other | Admitting: Radiation Oncology

## 2015-10-25 ENCOUNTER — Encounter: Payer: Self-pay | Admitting: Student in an Organized Health Care Education/Training Program

## 2015-10-25 VITALS — BP 120/70 | HR 54 | Temp 98.5°F | Ht 62.0 in | Wt 176.2 lb

## 2015-10-25 DIAGNOSIS — C3412 Malignant neoplasm of upper lobe, left bronchus or lung: Secondary | ICD-10-CM | POA: Diagnosis not present

## 2015-10-25 DIAGNOSIS — N184 Chronic kidney disease, stage 4 (severe): Secondary | ICD-10-CM

## 2015-10-25 DIAGNOSIS — C3431 Malignant neoplasm of lower lobe, right bronchus or lung: Secondary | ICD-10-CM | POA: Diagnosis not present

## 2015-10-25 DIAGNOSIS — Z510 Encounter for antineoplastic radiation therapy: Secondary | ICD-10-CM | POA: Diagnosis not present

## 2015-10-25 DIAGNOSIS — R0600 Dyspnea, unspecified: Secondary | ICD-10-CM | POA: Diagnosis present

## 2015-10-25 DIAGNOSIS — I2510 Atherosclerotic heart disease of native coronary artery without angina pectoris: Secondary | ICD-10-CM

## 2015-10-25 DIAGNOSIS — E875 Hyperkalemia: Secondary | ICD-10-CM | POA: Diagnosis not present

## 2015-10-25 LAB — BASIC METABOLIC PANEL WITH GFR
BUN: 37 mg/dL — AB (ref 7–25)
CHLORIDE: 107 mmol/L (ref 98–110)
CO2: 18 mmol/L — ABNORMAL LOW (ref 20–31)
Calcium: 8.3 mg/dL — ABNORMAL LOW (ref 8.6–10.4)
Creat: 1.62 mg/dL — ABNORMAL HIGH (ref 0.60–0.93)
GFR, Est African American: 36 mL/min — ABNORMAL LOW (ref >=60)
GFR, Est Non African American: 31 mL/min — ABNORMAL LOW (ref >=60)
GLUCOSE: 187 mg/dL — AB (ref 65–99)
POTASSIUM: 5.6 mmol/L — AB (ref 3.5–5.3)
Sodium: 136 mmol/L (ref 135–146)

## 2015-10-25 MED ORDER — SODIUM POLYSTYRENE SULFONATE PO POWD: Freq: Once | ORAL | 0 refills | Status: DC

## 2015-10-25 MED ORDER — SODIUM POLYSTYRENE SULFONATE PO POWD: Freq: Once | ORAL | 0 refills | Status: AC

## 2015-10-25 NOTE — Patient Instructions (Addendum)
It was a pleasure to see you again today in our clinic!  Today we discussed your high potassium levels. Here is the treatment plan we have discussed and agreed upon together:  - Take Kayexalate every day for the next two weeks - Take an extra 1/2 Lasix pill for the next two days (8/5, 8/6) - Schedule a follow up appointment in 2 weeks - We consulted nephrology.  They will call you to schedule an appointment. If you do not hear from them in the next two weeks call our clinic so that we can reach out to them again. - We will repeat your potassium blood work again today and I will call you with the results. - If you develop chest pain or light headedness or shortness of breath please call and come in to the ED.  Our clinic's number is [phone]. Please call with questions or concerns about what we discussed today.  - Dr. Burr Medico

## 2015-10-25 NOTE — Progress Notes (Signed)
   CC: hyperkalemia follow up  HPI: Natalie Porter is a 75 y.o. female who presents to [facility] today after having hyperkalemia to  5.9 on recent lab work 8d ago.  The patient has a PMHx of stage IV CKD and had previously been hospitalized for a CHF exacerbation at which time she was noted to be hyperkalemic, treated with kayexalate, to be followed up with PCP at discharge.  Follow up labs indicate a further increase in potassium and so the patient came back in to discuss management today.    She denies any shortness of breath, chest pain, muscle cramps or fatigue.  No nausea/vomiting/diarrhea or constipation.      Review of Symptoms: See HPI for ROS.   CC, SH/smoking status, and VS noted.  Objective: BP 120/70   Pulse (!) 54   Temp 98.5 F (36.9 C) (Oral)   Ht '5\' 2"'$  (1.575 m)   Wt 79.9 kg (176 lb 3.2 oz)   BMI 32.23 kg/m  GEN: NAD, alert, cooperative, and pleasant. RESPIRATORY: clear to auscultation bilaterally with no wheezes, rhonchi or rales, good effort CV: RRR, no m/r/g, no peripheral edema GI: soft, non-tender, non-distended, normoactive bowel sounds, no hepatosplenomegaly SKIN: warm and dry, no rashes or lesions NEURO: II-XII grossly intact, normal gait, peripheral sensation intact PSYCH: AAOx3, appropriate affect  Assessment and plan:  Hyperkalemia Currently asymptomatic.  Potassium from blood draw eight days ago is elevated at 5.9.  Patient with stage IV renal disease.   - repeat BMP today - Patient to take extra dose of lasix today and tomorrow - Gave kayexalate - patient to follow up in two weeks for repeat labs or earlier depending on results from today's BMP - consulted nephrology - Patient advised to call our office or go in to the emergency department if she develops chest pain, as she will need emergent EKG, redraw of potassium levels, and urgent management.   Orders Placed This Encounter  Procedures  . BASIC METABOLIC PANEL WITH GFR  . Ambulatory  referral to Nephrology    Referral Priority:   Routine    Referral Type:   Consultation    Referral Reason:   Specialty Services Required    Requested Specialty:   Nephrology    Number of Visits Requested:   1    Meds ordered this encounter  Medications  . DISCONTD: sodium polystyrene (KAYEXALATE) powder    Sig: Take by mouth once.    Dispense:  454 g    Refill:  0  . sodium polystyrene (KAYEXALATE) powder    Sig: Take by mouth once.    Dispense:  454 g    Refill:  0     Everrett Coombe, MD,MS,  PGY1 10/27/2015 11:42 AM

## 2015-10-27 DIAGNOSIS — E875 Hyperkalemia: Secondary | ICD-10-CM | POA: Insufficient documentation

## 2015-10-27 NOTE — Assessment & Plan Note (Signed)
Currently asymptomatic.  Potassium from blood draw eight days ago is elevated at 5.9.  Patient with stage IV renal disease.   - repeat BMP today - Patient to take extra dose of lasix today and tomorrow - Gave kayexalate - patient to follow up in two weeks for repeat labs or earlier depending on results from today's BMP - consulted nephrology - Patient advised to call our office or go in to the emergency department if she develops chest pain, as she will need emergent EKG, redraw of potassium levels, and urgent management.

## 2015-10-28 ENCOUNTER — Telehealth: Payer: Self-pay | Admitting: Student in an Organized Health Care Education/Training Program

## 2015-10-28 ENCOUNTER — Ambulatory Visit: Admission: RE | Admit: 2015-10-28 | Payer: Medicare Other | Source: Ambulatory Visit | Admitting: Radiation Oncology

## 2015-10-28 DIAGNOSIS — C3412 Malignant neoplasm of upper lobe, left bronchus or lung: Secondary | ICD-10-CM | POA: Diagnosis not present

## 2015-10-28 DIAGNOSIS — C3431 Malignant neoplasm of lower lobe, right bronchus or lung: Secondary | ICD-10-CM | POA: Diagnosis not present

## 2015-10-28 DIAGNOSIS — Z510 Encounter for antineoplastic radiation therapy: Secondary | ICD-10-CM | POA: Diagnosis not present

## 2015-10-29 ENCOUNTER — Encounter: Payer: Self-pay | Admitting: Radiation Oncology

## 2015-10-29 ENCOUNTER — Ambulatory Visit: Admission: RE | Admit: 2015-10-29 | Payer: Medicare Other | Source: Ambulatory Visit | Admitting: Radiation Oncology

## 2015-10-29 ENCOUNTER — Ambulatory Visit
Admission: RE | Admit: 2015-10-29 | Discharge: 2015-10-29 | Disposition: A | Discharge status: Home / Self Care | Payer: Medicare Other | Source: Ambulatory Visit | Attending: Radiation Oncology | Admitting: Radiation Oncology

## 2015-10-29 VITALS — BP 145/56 | HR 57 | Temp 97.7°F | Ht 62.0 in | Wt 174.2 lb

## 2015-10-29 DIAGNOSIS — C3412 Malignant neoplasm of upper lobe, left bronchus or lung: Secondary | ICD-10-CM | POA: Diagnosis not present

## 2015-10-29 DIAGNOSIS — Z510 Encounter for antineoplastic radiation therapy: Secondary | ICD-10-CM | POA: Diagnosis not present

## 2015-10-29 DIAGNOSIS — C3431 Malignant neoplasm of lower lobe, right bronchus or lung: Secondary | ICD-10-CM | POA: Diagnosis not present

## 2015-10-29 NOTE — Progress Notes (Signed)
  Radiation Oncology         [phone]) [phone] ________________________________  Name: Natalie Porter MRN: [id number]  Date: 10/29/2015  DOB: 03-19-1941  Weekly Radiation Therapy Management    ICD-9-CM ICD-10-CM   1. Primary cancer of left upper lobe of lung (HCC) 162.3 C34.12     Current Dose: 45 Gy     Planned Dose:  50 Gy  Narrative . . . . . . . . The patient presents for routine under treatment assessment.                     Natalie Porter has completed 9 fractions to her left upper lung. She denies having pain, shortness of breath, cough, skin irritation, or fatigue.  She did say her pills are getting stuck in her throat and she is going to start taking them one at a time.                                 Set-up films were reviewed.                                 The chart was checked. Physical Findings. . .  height is '5\' 2"'$  (1.575 m) and weight is 174 lb 3.2 oz (79 kg). Her oral temperature is 97.7 F (36.5 C). Her blood pressure is 145/56 (abnormal) and her pulse is 57 (abnormal). Her oxygen saturation is 100%. . Lungs are clear to auscultation bilaterally. Heart has regular rate and rhythm. Impression . . . . . . . The patient is tolerating radiation. Plan . . . . . . . . . . . . Continue treatment as planned. The patient was given a 1 month follow up card. ________________________________   Blair Promise, PhD, MD  This document serves as a record of services personally performed by Gery Pray, MD. It was created on his behalf by Darcus Austin, a trained medical scribe. The creation of this record is based on the scribe's personal observations and the provider's statements to them. This document has been checked and approved by the attending provider.

## 2015-10-29 NOTE — Progress Notes (Signed)
Natalie Porter has completed 9 fractions to her left upper lung.  She denies having pain, shortness of breath, cough, skin irritation or fatigue.  She did say her pills are getting stuck in her throat.  She is going to start taking them one at a time.  She has been given a one month follow up appointment.  BP (!) 145/56 (BP Location: Right Wrist, Patient Position: Sitting)   Pulse (!) 57   Temp 97.7 F (36.5 C) (Oral)   Ht '5\' 2"'$  (1.575 m)   Wt 174 lb 3.2 oz (79 kg)   SpO2 100%   BMI 31.86 kg/m    Wt Readings from Last 3 Encounters:  10/29/15 174 lb 3.2 oz (79 kg)  10/25/15 176 lb 3.2 oz (79.9 kg)  10/22/15 174 lb (78.9 kg)

## 2015-10-30 ENCOUNTER — Ambulatory Visit: Admission: RE | Admit: 2015-10-30 | Payer: Medicare Other | Source: Ambulatory Visit | Admitting: Radiation Oncology

## 2015-10-30 DIAGNOSIS — C3412 Malignant neoplasm of upper lobe, left bronchus or lung: Secondary | ICD-10-CM | POA: Diagnosis not present

## 2015-10-30 DIAGNOSIS — Z510 Encounter for antineoplastic radiation therapy: Secondary | ICD-10-CM | POA: Diagnosis not present

## 2015-10-30 DIAGNOSIS — C3431 Malignant neoplasm of lower lobe, right bronchus or lung: Secondary | ICD-10-CM | POA: Diagnosis not present

## 2015-10-30 NOTE — Telephone Encounter (Signed)
Left Message - Contacted patient's daughter to give her lab results.Left message that results were not critical. - Potassium slightly improved from previous study - we will continue our plan: kayexalate and have patient follow up in the office next week    By Everrett Coombe, MD

## 2015-10-31 ENCOUNTER — Ambulatory Visit: Admission: RE | Admit: 2015-10-31 | Payer: Medicare Other | Source: Ambulatory Visit | Admitting: Radiation Oncology

## 2015-11-07 DIAGNOSIS — H34813 Central retinal vein occlusion, bilateral, with macular edema: Secondary | ICD-10-CM | POA: Diagnosis not present

## 2015-11-07 NOTE — Progress Notes (Signed)
Subjective:     Patient ID: Natalie Porter, female   DOB: [birth year], 75 y.o.   MRN: [id number]  HPI Mrs. Milbrath is a 75yo female presenting for follow up of Hyperkalemia and Chronic Kidney Disease. - Previously seen on 10/25/15 for hyperkalemia of 5.9. Lasix increased x2 days and Kayexalate given. Referred to Nephrology. Improvement to potassium 5.6 in interim. -Reports finishing course of Kayexalate a few days ago. -Increase Lasix for 2 days. Has since returned to baseline dosing, 80 mg daily. -Has not heard yet heard from nephrology. -Denies chest pain, shortness of breath, palpitations. -Does note occasional lower back pain. Requests refill of Tylenol # 3, which normally controls her pain. Pain occurs mostly with walking. Only takes medication every once in a while, usually only one time per month.  Review of Systems Per HPI. Other systems negative.    Objective:   Physical Exam  Constitutional: She appears well-developed and well-nourished. No distress.  Cardiovascular: Normal rate and regular rhythm.   Murmur heard. Pulmonary/Chest: Effort normal. No respiratory distress. She has no wheezes.  Musculoskeletal:  No midline spinal tenderness.  Psychiatric: She has a normal mood and affect. Her behavior is normal.       Assessment and Plan:     1. Hyperkalemia -Will obtain BMP today to monitor potassium and kidney function -Recommend follow-up with nephrology. To let office know if she does not hear from them over the next several weeks. -Follow-up pending BMP results  2. Bilateral low back pain without sciatica -Refill Tylenol #3. Discussed adverse effects of these medications including increased falls. Patient elected to proceed with medication.

## 2015-11-08 ENCOUNTER — Encounter: Payer: Self-pay | Admitting: Family Medicine

## 2015-11-08 ENCOUNTER — Ambulatory Visit (INDEPENDENT_AMBULATORY_CARE_PROVIDER_SITE_OTHER): Payer: Medicare Other | Admitting: Family Medicine

## 2015-11-08 VITALS — BP 138/60 | HR 54 | Temp 98.3°F | Ht 62.0 in | Wt 175.6 lb

## 2015-11-08 DIAGNOSIS — E875 Hyperkalemia: Secondary | ICD-10-CM | POA: Diagnosis not present

## 2015-11-08 DIAGNOSIS — M545 Low back pain, unspecified: Secondary | ICD-10-CM

## 2015-11-08 DIAGNOSIS — I2510 Atherosclerotic heart disease of native coronary artery without angina pectoris: Secondary | ICD-10-CM

## 2015-11-08 MED ORDER — ACETAMINOPHEN-CODEINE #2 300-15 MG PO TABS: 1.0000 | ORAL_TABLET | ORAL | 0 refills | Status: AC | PRN

## 2015-11-08 NOTE — Patient Instructions (Signed)
Thank you so much for coming to visit today! -We will recheck your potassium and kidney function today. Please follow-up with nephrology. -I have refilled your medication. This medication can increase her risk of falls. Please only use as needed and as prescribed. -Follow up pending lab results.  Dr. Junie Panning

## 2015-11-09 LAB — BASIC METABOLIC PANEL WITH GFR
BUN: 37 mg/dL — ABNORMAL HIGH (ref 7–25)
CALCIUM: 8.4 mg/dL — AB (ref 8.6–10.4)
CO2: 28 mmol/L (ref 20–31)
Chloride: 100 mmol/L (ref 98–110)
Creat: 1.45 mg/dL — ABNORMAL HIGH (ref 0.60–0.93)
GFR, EST AFRICAN AMERICAN: 41 mL/min — AB (ref >=60)
GFR, EST NON AFRICAN AMERICAN: 35 mL/min — AB (ref >=60)
Glucose, Bld: 147 mg/dL — ABNORMAL HIGH (ref 65–99)
Potassium: 3.5 mmol/L (ref 3.5–5.3)
SODIUM: 139 mmol/L (ref 135–146)

## 2015-11-10 NOTE — Progress Notes (Signed)
Cardiology Office Note   Date:  11/11/2015   ID:  Natalie Porter, Natalie Porter 1941-02-03, MRN [id number]  PCP:  Natalie Coombe, MD  Cardiologist:   Minus Breeding, MD   Chief Complaint  Patient presents with  . Shortness of Breath      History of Present Illness: Natalie Porter is a 75 y.o. female who presents for follow-up of known coronary disease. She had previously established with another cardiology group. . She has peripheral vascular disease and did have an abnormal stress test in 2015 with some anterior ischemia.  She has a cardiac catheterization with nonobstructive disease and was managed medically. Echocardiography demonstrated well-preserved ejection fraction with an EF of 65%. She still has had heart failure with this preserved ejection fraction.   She presents for follow up.  She reports that she's had some rare palpitations but no presyncope or syncope. She gets around with her cane and can walk without it. She denies any cardiovascular symptoms such as acute shortness of breath, PND or orthopnea. She's not having any chest pressure, neck or arm discomfort. She does have sleep apnea and received some new equipment recently and this mad a big difference and improved her quality of sleep.  Of note her potassium was recently been high. She was not taking any supplements. Her primary provider increased her Lasix and gave her Kayexalate. I did review these records. Her labs now have a potassium of 3.5. She does have some renal insufficiency and is going to see a nephrologist.   Past Medical History:  Diagnosis Date  . Anemia   . Arthritis   . CAD (coronary artery disease)    Cath 2015, 50% calcified LAD, 60% circumflex stenosis, right coronary artery 70% stenosis medical management  . CHF (congestive heart failure) (HCC)    Preserved EF.    Marland Kitchen COPD (chronic obstructive pulmonary disease) ([place])   . Depression   . Diabetes mellitus without complication ([place])    INSULIN  DEPENDENT  . DVT (deep venous thrombosis) ([place])    2011  . Gallstone   . GERD (gastroesophageal reflux disease)   . Hx: UTI (urinary tract infection)   . Hypercholesteremia   . Hypertension   . Iron deficiency anemia   . Lung cancer [facility])    New diagnosis (treated at [facility]).  Going to receive radiation  . Neuropathy ([place])   . Osteoarthritis   . Pneumonia 2014  . PVD (peripheral vascular disease) ([place])   . Radiation 03/26/15, 03/28/15, 04/02/15   right lower lobe nodule SBRT 54 gray  . Renal disorder    stage 3  . SBO (small bowel obstruction) ([place]) 08/2015  . Sleep apnea    CPAP  . TIA (transient ischemic attack)    PER PATIENT  . Vitamin B12 deficiency     Past Surgical History:  Procedure Laterality Date  . ABDOMINAL AORTAGRAM N/A 07/25/2013   Procedure: ABDOMINAL Maxcine Ham;  Surgeon: Serafina Mitchell, MD;  Location: [facility] CATH LAB;  Service: Cardiovascular;  Laterality: N/A;  . APPLICATION OF WOUND VAC Bilateral 08/07/2013   Procedure: APPLICATION OF INCISIONAL WOUND VAC;  Surgeon: Elam Dutch, MD;  Location: [facility];  Service: Vascular;  Laterality: Bilateral;  . BREAST BIOPSY Left   . CAROTID ENDARTERECTOMY Right 1990  . CARPAL TUNNEL RELEASE Bilateral   . COLONOSCOPY W/ POLYPECTOMY    . ENDARTERECTOMY FEMORAL Left 08/07/2013   Procedure: ENDARTERECTOMY FEMORAL;  Surgeon: Elam Dutch, MD;  Location: [facility];  Service: Vascular;  Laterality: Left;  . EYE SURGERY Bilateral    cataract  . FEMORAL-FEMORAL BYPASS GRAFT  08/07/2013   Procedure: BYPASS GRAFT FEMORAL-FEMORAL ARTERY USING 8MM X 30CM HEMASHIELD GRAFT; LEFT ILIAC ANGIOGRAM; ATTEMPTED LEFT ILIAC ARTERY STENT GRAFT;  Surgeon: Elam Dutch, MD;  Location: [facility];  Service: Vascular;;  . FOOT SURGERY Bilateral    bone spurs, and repair  . LEFT HEART CATHETERIZATION WITH CORONARY ANGIOGRAM N/A 07/27/2013   Procedure: LEFT HEART CATHETERIZATION WITH CORONARY ANGIOGRAM;  Surgeon: Minus Breeding, MD;  Location: [facility] CATH  LAB;  Service: Cardiovascular;  Laterality: N/A;  . LUNG BIOPSY     non cancerous  . TUBAL LIGATION       Current Outpatient Prescriptions  Medication Sig Dispense Refill  . acetaminophen-codeine (TYLENOL #2) 300-15 MG tablet Take 1 tablet by mouth every 4 (four) hours as needed for moderate pain. 30 tablet 0  . amLODipine (NORVASC) 10 MG tablet Take 10 mg by mouth daily.    Marland Kitchen aspirin EC 81 MG tablet Take 81 mg by mouth daily.    Marland Kitchen atorvastatin (LIPITOR) 80 MG tablet Take 80 mg by mouth daily.    . Calcium Carbonate (CALCIUM-CARB 600 PO) Take 600 mg by mouth 2 (two) times daily.    . carvedilol (COREG) 25 MG tablet Take 25 mg by mouth 2 (two) times daily with a meal.    . escitalopram (LEXAPRO) 20 MG tablet Take 20 mg by mouth daily.    . fluticasone (FLONASE) 50 MCG/ACT nasal spray Place 2 sprays into both nostrils daily.    . furosemide (LASIX) 80 MG tablet TAKE 1 TABLET DAILY 90 tablet 3  . gabapentin (NEURONTIN) 100 MG capsule Take 200-300 mg by mouth 2 (two) times daily. 200 mg in the morning and 300 mg at bedtime    . insulin glargine (LANTUS) 100 UNIT/ML injection Inject 30 Units into the skin at bedtime.     Marland Kitchen omeprazole (PRILOSEC) 20 MG capsule Take 20 mg by mouth daily.    Marland Kitchen tiotropium (SPIRIVA) 18 MCG inhalation capsule Place 18 mcg into inhaler and inhale daily.     No current facility-administered medications for this visit.     Allergies:   Codeine and Morphine and related    ROS:  Please see the history of present illness.   Otherwise, review of systems are positive for none.   All other systems are reviewed and negative.    PHYSICAL EXAM: VS:  BP 118/70 (BP Location: Left Wrist, Patient Position: Sitting, Cuff Size: Normal)   Pulse (!) 54   Ht '5\' 2"'$  (1.575 m)   Wt 174 lb 12.8 oz (79.3 kg)   BMI 31.97 kg/m  , BMI Body mass index is 31.97 kg/m. GENERAL:  Well appearing HEENT:  Pupils equal round and reactive, fundi not visualized, oral mucosa unremarkable NECK:   No jugular venous distention, waveform within normal limits, carotid upstroke brisk and symmetric, bilateral bruits, no thyromegaly LYMPHATICS:  No cervical, inguinal adenopathy LUNGS:  Clear to auscultation bilaterally BACK:  No CVA tenderness CHEST:  Unremarkable HEART:  PMI not displaced or sustained,S1 and S2 within normal limits, no S3, no S4, no clicks, no rubs, 2 out of 6 apical early peaking systolic murmur radiating slightly out the aortic outflow tract, no diastolic murmurs ABD:  Flat, positive bowel sounds normal in frequency in pitch, no bruits, no rebound, no guarding, no midline pulsatile mass, no hepatomegaly, no splenomegaly EXT:  2 plus pulses upper  and decreased DP/PT, no edema, no cyanosis no clubbing SKIN:  No rashes no nodules NEURO:  Cranial nerves II through XII grossly intact, motor grossly intact throughout PSYCH:  Cognitively intact, oriented to person place and time   EKG:  EKG is not ordered today.   Recent Labs: 09/27/2015: ALT 12; B Natriuretic Peptide 570.0; Magnesium 2.5 10/09/2015: Hemoglobin 10.4; Platelets 292 11/08/2015: BUN 37; Creat 1.45; Potassium 3.5; Sodium 139    Lipid Panel No results found for: CHOL, TRIG, HDL, CHOLHDL, VLDL, LDLCALC, LDLDIRECT    Wt Readings from Last 3 Encounters:  11/11/15 174 lb 12.8 oz (79.3 kg)  11/08/15 175 lb 9.6 oz (79.7 kg)  10/29/15 174 lb 3.2 oz (79 kg)      Other studies Reviewed: Additional studies/ records that were reviewed today include: Office records and labs    ASSESSMENT AND PLAN:  CHRONIC DIASTOLIC HF:  She seems to be euvolemic.  No change in therapy is indicated.   No further imaging is needed  HTN:   The blood pressure is at target. No change in medications is indicated. We will continue with therapeutic lifestyle changes (TLC).  CAD:  The patient has no new sypmtoms.  No further cardiovascular testing is indicated.  We will continue with aggressive risk reduction and meds as  listed.  HYPERKALEMIA:  This was improved 3 days ago on labs and we reviewed this.  I gave her a list of high potassium containing foods to avoid.   SLEEP APNEA:  She is doing well from this standpoint and is following with  Dr. Claiborne Billings.  DYSLIPIDEMIA:  I will check a lipid profile.  The goal should be an LDL in the 70s.  DM:  Per Natalie Coombe, MD  Lab Results  Component Value Date   HGBA1C 6.9 (H) 09/28/2015    Current medicines are reviewed at length with the patient today.  The patient does not have concerns regarding medicines.  The following changes have been made:  no change  Labs/ tests ordered today include:  Orders Placed This Encounter  Procedures  . Lipid Profile     Disposition:   FU with me as needed.     Signed, Minus Breeding, MD  11/11/2015 8:34 PM    [place]

## 2015-11-11 ENCOUNTER — Ambulatory Visit (INDEPENDENT_AMBULATORY_CARE_PROVIDER_SITE_OTHER): Payer: Medicare Other | Admitting: Cardiology

## 2015-11-11 ENCOUNTER — Encounter: Payer: Self-pay | Admitting: Family Medicine

## 2015-11-11 ENCOUNTER — Encounter: Payer: Self-pay | Admitting: Cardiology

## 2015-11-11 VITALS — BP 118/70 | HR 54 | Ht 62.0 in | Wt 174.8 lb

## 2015-11-11 DIAGNOSIS — I5032 Chronic diastolic (congestive) heart failure: Secondary | ICD-10-CM | POA: Diagnosis not present

## 2015-11-11 DIAGNOSIS — I2510 Atherosclerotic heart disease of native coronary artery without angina pectoris: Secondary | ICD-10-CM | POA: Diagnosis not present

## 2015-11-11 DIAGNOSIS — E785 Hyperlipidemia, unspecified: Secondary | ICD-10-CM | POA: Diagnosis not present

## 2015-11-11 DIAGNOSIS — E875 Hyperkalemia: Secondary | ICD-10-CM

## 2015-11-11 LAB — LIPID PANEL
CHOLESTEROL: 139 mg/dL (ref 125–200)
HDL: 30 mg/dL — ABNORMAL LOW (ref >=46)
LDL Cholesterol: 71 mg/dL (ref <130)
TRIGLYCERIDES: 188 mg/dL — AB (ref <150)
Total CHOL/HDL Ratio: 4.6 Ratio (ref <=5.0)
VLDL: 38 mg/dL — AB (ref <30)

## 2015-11-11 NOTE — Patient Instructions (Signed)
Medication Instructions:  Continue current medicaitons  Labwork: Fasting Lipids  Testing/Procedures: NONE  Follow-Up: Your physician recommends that you schedule a follow-up appointment in: As Needed   Any Other Special Instructions Will Be Listed Below (If Applicable).   If you need a refill on your cardiac medications before your next appointment, please call your pharmacy.

## 2015-11-14 ENCOUNTER — Telehealth: Payer: Self-pay | Admitting: *Deleted

## 2015-11-14 NOTE — Telephone Encounter (Signed)
Received call from [facility] requesting to change Rx tylenol #2 to Tylenol #3.  Tylenol #2 is not available.  Please give [facility] a call at [phone].  Patient is waiting.  Derl Barrow, RN

## 2015-11-15 NOTE — Telephone Encounter (Signed)
[facility] Pharmacist called again regarding changing Tylenol #2 to #3.  Verbal order given by Dr. Gerlean Ren to change to Tylenol #3. Derl Barrow, RN

## 2015-12-05 ENCOUNTER — Ambulatory Visit: Payer: Self-pay | Admitting: Radiation Oncology

## 2015-12-05 DIAGNOSIS — H348132 Central retinal vein occlusion, bilateral, stable: Secondary | ICD-10-CM | POA: Diagnosis not present

## 2015-12-10 DIAGNOSIS — N183 Chronic kidney disease, stage 3 (moderate): Secondary | ICD-10-CM | POA: Diagnosis not present

## 2015-12-10 DIAGNOSIS — I129 Hypertensive chronic kidney disease with stage 1 through stage 4 chronic kidney disease, or unspecified chronic kidney disease: Secondary | ICD-10-CM | POA: Diagnosis not present

## 2015-12-10 DIAGNOSIS — D631 Anemia in chronic kidney disease: Secondary | ICD-10-CM | POA: Diagnosis not present

## 2015-12-10 DIAGNOSIS — Z6832 Body mass index (BMI) 32.0-32.9, adult: Secondary | ICD-10-CM | POA: Diagnosis not present

## 2015-12-10 DIAGNOSIS — I5030 Unspecified diastolic (congestive) heart failure: Secondary | ICD-10-CM | POA: Diagnosis not present

## 2015-12-10 DIAGNOSIS — N2581 Secondary hyperparathyroidism of renal origin: Secondary | ICD-10-CM | POA: Diagnosis not present

## 2015-12-12 ENCOUNTER — Other Ambulatory Visit: Payer: Self-pay | Admitting: Nephrology

## 2015-12-12 DIAGNOSIS — N183 Chronic kidney disease, stage 3 unspecified: Secondary | ICD-10-CM

## 2015-12-17 DIAGNOSIS — N183 Chronic kidney disease, stage 3 (moderate): Secondary | ICD-10-CM | POA: Diagnosis not present

## 2015-12-19 ENCOUNTER — Encounter: Payer: Self-pay | Admitting: Radiation Oncology

## 2015-12-19 ENCOUNTER — Other Ambulatory Visit: Payer: Medicare Other

## 2015-12-19 ENCOUNTER — Ambulatory Visit
Admission: RE | Admit: 2015-12-19 | Discharge: 2015-12-19 | Disposition: A | Discharge status: Home / Self Care | Payer: Medicare Other | Source: Ambulatory Visit | Attending: Radiation Oncology | Admitting: Radiation Oncology

## 2015-12-19 DIAGNOSIS — C3412 Malignant neoplasm of upper lobe, left bronchus or lung: Secondary | ICD-10-CM | POA: Diagnosis not present

## 2015-12-19 DIAGNOSIS — Z794 Long term (current) use of insulin: Secondary | ICD-10-CM | POA: Diagnosis not present

## 2015-12-19 DIAGNOSIS — C3431 Malignant neoplasm of lower lobe, right bronchus or lung: Secondary | ICD-10-CM | POA: Insufficient documentation

## 2015-12-19 DIAGNOSIS — R0602 Shortness of breath: Secondary | ICD-10-CM | POA: Diagnosis not present

## 2015-12-19 DIAGNOSIS — Z7982 Long term (current) use of aspirin: Secondary | ICD-10-CM | POA: Insufficient documentation

## 2015-12-19 DIAGNOSIS — Z79899 Other long term (current) drug therapy: Secondary | ICD-10-CM | POA: Insufficient documentation

## 2015-12-19 DIAGNOSIS — Z923 Personal history of irradiation: Secondary | ICD-10-CM | POA: Diagnosis not present

## 2015-12-19 DIAGNOSIS — Z888 Allergy status to other drugs, medicaments and biological substances status: Secondary | ICD-10-CM | POA: Insufficient documentation

## 2015-12-19 NOTE — Progress Notes (Signed)
Natalie Porter here for follow up after treatment to her left upper lobe.  She denies having pain but reports having pain in her right lower rib cage over to her left chest for 2 weeks after treatment.  She reports shortness of breath with activity.  She denies having a cough.  She denies having a sore throat or trouble swallowing.  She reports her energy level is improving.  She denies having any skin irritation.  BP (!) 139/46 (BP Location: Left Wrist, Patient Position: Sitting)   Pulse (!) 52   Temp 97.8 F (36.6 C) (Oral)   Ht '5\' 2"'$  (1.575 m)   Wt 180 lb 6.4 oz (81.8 kg)   SpO2 97%   BMI 33.00 kg/m    Wt Readings from Last 3 Encounters:  12/19/15 180 lb 6.4 oz (81.8 kg)  11/11/15 174 lb 12.8 oz (79.3 kg)  11/08/15 175 lb 9.6 oz (79.7 kg)

## 2015-12-19 NOTE — Progress Notes (Signed)
Radiation Oncology         [phone] ________________________________  Name: Natalie Porter MRN: [id number]  Date: 12/19/2015  DOB: [birth year]  Follow-Up Visit Note  CC: Everrett Coombe, MD  Derwood Kaplan, MD    ICD-9-CM ICD-10-CM   1. 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 She denies having pain, but reports pain in her right lower rib cage for 2 weeks after treatment.  She reports shortness of breath with activity.  She denies a cough, hemoptysis, a sore throat, difficulty swallowing, or skin irritation in the treatment field.  She reports her energy level is improving.  ALLERGIES:  is allergic to codeine and morphine and related.  Meds: Current Outpatient Prescriptions  Medication Sig Dispense Refill  . acetaminophen-codeine (TYLENOL #2) 300-15 MG tablet Take 1 tablet by mouth every 4 (four) hours as needed for moderate pain. 30 tablet 0  . amLODipine (NORVASC) 10 MG tablet Take 10 mg by mouth daily.    Marland Kitchen aspirin EC 81 MG tablet Take 81 mg by mouth daily.    Marland Kitchen atorvastatin (LIPITOR) 80 MG tablet Take 80 mg by mouth daily.    . Calcium Carbonate (CALCIUM-CARB 600 PO) Take 600 mg by mouth 2 (two) times daily.    . carvedilol (COREG) 25 MG tablet Take 25 mg by mouth 2 (two) times daily with a meal.    . escitalopram (LEXAPRO) 20 MG tablet Take 20 mg by mouth daily.    . fluticasone (FLONASE) 50 MCG/ACT nasal spray Place 2 sprays into both nostrils daily.    . furosemide (LASIX) 80 MG tablet TAKE 1 TABLET DAILY 90 tablet 3  . gabapentin (NEURONTIN)  100 MG capsule Take 200-300 mg by mouth 2 (two) times daily. 200 mg in the morning and 300 mg at bedtime    . insulin glargine (LANTUS) 100 UNIT/ML injection Inject 30 Units into the skin at bedtime.     Marland Kitchen omeprazole (PRILOSEC) 20 MG capsule Take 20 mg by mouth daily.    Marland Kitchen tiotropium (SPIRIVA) 18 MCG inhalation capsule Place 18 mcg into inhaler and inhale daily.     No current facility-administered medications for this encounter.     Physical Findings: The patient is in no acute distress. Patient is alert and oriented.  height is '5\' 2"'$  (1.575 m) and weight is 180 lb 6.4 oz (81.8 kg). Her oral temperature is 97.8 F (36.6 C). Her blood pressure is 139/46 (abnormal) and her pulse is 52 (abnormal). Her oxygen saturation is 97%.   Lungs are clear to auscultation bilaterally. Heart has regular rate and rhythm. The patient is ambulatory with a cane.  Lab Findings: Lab Results  Component Value Date   WBC 10.8 10/09/2015   HGB 10.4 (L) 10/09/2015   HCT 32.6 (L) 10/09/2015   MCV 88.3 10/09/2015   PLT 292 10/09/2015    Radiographic Findings: No results found.  Impression: The patient is clinically doing well. No residual side effects from her treatment.  Plan: The patient would like to continue follow up with Dr. Altha Harm  Hinton Rao in [place] for convenience.The patient is scheduled for repeat imaging in December and I advised the patient to notify Dr. Hinton Rao to forward the imaging results to me. The patient will see me on a PRN basis. Always available for questions.  ____________________________________ -----------------------------------  Blair Promise, PhD, MD  This document serves as a record of services personally performed by Gery Pray, MD. It was created on his behalf by Darcus Austin, a trained medical scribe. The creation of this record is based on the scribe's personal observations and the provider's statements to them. This document has been checked and approved by the  attending provider.

## 2016-01-02 DIAGNOSIS — H34813 Central retinal vein occlusion, bilateral, with macular edema: Secondary | ICD-10-CM | POA: Diagnosis not present

## 2016-01-06 ENCOUNTER — Encounter: Payer: Self-pay | Admitting: Internal Medicine

## 2016-01-06 ENCOUNTER — Ambulatory Visit (INDEPENDENT_AMBULATORY_CARE_PROVIDER_SITE_OTHER): Payer: Medicare Other | Admitting: Internal Medicine

## 2016-01-06 VITALS — BP 122/52 | HR 52 | Temp 97.9°F | Ht 62.0 in | Wt 177.8 lb

## 2016-01-06 DIAGNOSIS — I2510 Atherosclerotic heart disease of native coronary artery without angina pectoris: Secondary | ICD-10-CM

## 2016-01-06 DIAGNOSIS — R062 Wheezing: Secondary | ICD-10-CM

## 2016-01-06 DIAGNOSIS — J069 Acute upper respiratory infection, unspecified: Secondary | ICD-10-CM

## 2016-01-06 MED ORDER — ALBUTEROL SULFATE HFA 108 (90 BASE) MCG/ACT IN AERS: 2.0000 | INHALATION_SPRAY | RESPIRATORY_TRACT | 1 refills | Status: DC | PRN

## 2016-01-06 NOTE — Patient Instructions (Signed)
[name],  It is very nice to meet you.  Please use albuterol inhaler 2 puffs up to every 4 hours as needed for shortness of breath or wheezing. Try dextromethorphan (robitussin) with low sugar (ask your pharmacist) as needed for cough.  Try miralax daily for abdominal distention. If you have vomiting or pain worsens, you may need imaging, but it is reassuring that you have recently had a bowel movement.   Best, Dr. Ola Spurr

## 2016-01-06 NOTE — Progress Notes (Signed)
[facility] Progress Note  Subjective:  Natalie Porter is a 75 y.o. with history of CHF, COPD, T2DM and hospitalization for suspected CHF exacerbation in July who presents for SDA with concern for cough x 3 days. Believes she has had "on and off" fevers since yesterday, with highest recorded reading 99.9 F. Cough has been dry. Her granddaughter thought she'd heard wheezing. Patient's daughter, who accompanied pt to appointment, thinks granddaughters have had colds recently. She has been taking spiriva once daily. Used to use albuterol inhaler but has not needed for years. She does not endorse increased SOB. She has had some abdominal pain since yesterday, as well. Last BM was yesterday. Has had some rhinorrhea.  ROS: No n/v/d, no muscles aches  Allergies  Allergen Reactions  . Codeine Nausea Only  . Morphine And Related Nausea And Vomiting   Social: Remote smoking history  Objective: Blood pressure (!) 122/52, pulse (!) 52, temperature 97.9 F (36.6 C), temperature source Oral, height '5\' 2"'$  (1.575 m), weight 177 lb 12.8 oz (80.6 kg), SpO2 97 %. Constitutional: Well-appearing female, in NAD HENT: Mild erythema and swelling of nasal turbinates, normal posterior oropharynx, TMs normal bilaterally Lymph: No cervical or submandibular lymphadenopathy Cardiovascular: RRR, S1, S2, 3/6 systolic murmur (noted previously) Pulmonary/Chest: Effort normal and breath sounds normal. No respiratory distress. Talking in complete sentences with ease.  Abdominal: Soft. +BS, mild TTP diffusely, no rebound or guarding.  Musculoskeletal: No LE edema Neurological: AOx3, no focal deficits. Skin: Skin is warm and dry. Ecchymosis over abdomen where administers insulin  Psychiatric: Normal mood and affect.  Vitals reviewed  Assessment/Plan: Acute upper respiratory infection - Suspect mild viral infection. Prescribed albuterol to use as needed for wheezing or if has sensation of SOB. -  Recommend low-sugar dextromethorphan for cough but to try albuterol first.   Follow-up if symptoms do not improve.  Olene Floss, MD [place], PGY-2

## 2016-01-12 DIAGNOSIS — J069 Acute upper respiratory infection, unspecified: Secondary | ICD-10-CM | POA: Insufficient documentation

## 2016-01-12 MED ORDER — ALBUTEROL SULFATE HFA 108 (90 BASE) MCG/ACT IN AERS: 2.0000 | INHALATION_SPRAY | RESPIRATORY_TRACT | 1 refills | Status: DC | PRN

## 2016-01-12 NOTE — Assessment & Plan Note (Signed)
-   Suspect mild viral infection. Prescribed albuterol to use as needed for wheezing or if has sensation of SOB. - Recommend low-sugar dextromethorphan for cough but to try albuterol first.

## 2016-01-21 ENCOUNTER — Encounter: Payer: Self-pay | Admitting: Vascular Surgery

## 2016-01-24 DIAGNOSIS — H348111 Central retinal vein occlusion, right eye, with retinal neovascularization: Secondary | ICD-10-CM | POA: Diagnosis not present

## 2016-02-06 ENCOUNTER — Telehealth: Payer: Self-pay | Admitting: Student in an Organized Health Care Education/Training Program

## 2016-02-06 ENCOUNTER — Encounter: Payer: Self-pay | Admitting: Family Medicine

## 2016-02-06 ENCOUNTER — Ambulatory Visit (INDEPENDENT_AMBULATORY_CARE_PROVIDER_SITE_OTHER): Payer: Medicare Other | Admitting: Family Medicine

## 2016-02-06 ENCOUNTER — Other Ambulatory Visit: Payer: Self-pay | Admitting: Internal Medicine

## 2016-02-06 VITALS — BP 102/38 | HR 53 | Temp 98.1°F | Ht 62.0 in | Wt 180.6 lb

## 2016-02-06 DIAGNOSIS — R0602 Shortness of breath: Secondary | ICD-10-CM | POA: Diagnosis not present

## 2016-02-06 DIAGNOSIS — N183 Chronic kidney disease, stage 3 (moderate): Secondary | ICD-10-CM | POA: Diagnosis not present

## 2016-02-06 DIAGNOSIS — E1122 Type 2 diabetes mellitus with diabetic chronic kidney disease: Secondary | ICD-10-CM | POA: Diagnosis present

## 2016-02-06 DIAGNOSIS — I2510 Atherosclerotic heart disease of native coronary artery without angina pectoris: Secondary | ICD-10-CM | POA: Diagnosis not present

## 2016-02-06 DIAGNOSIS — I10 Essential (primary) hypertension: Secondary | ICD-10-CM | POA: Diagnosis not present

## 2016-02-06 DIAGNOSIS — R350 Frequency of micturition: Secondary | ICD-10-CM | POA: Diagnosis not present

## 2016-02-06 DIAGNOSIS — J449 Chronic obstructive pulmonary disease, unspecified: Secondary | ICD-10-CM | POA: Diagnosis not present

## 2016-02-06 LAB — POCT URINALYSIS DIPSTICK
BILIRUBIN UA: NEGATIVE
GLUCOSE UA: NEGATIVE
Ketones, UA: NEGATIVE
NITRITE UA: NEGATIVE
Protein, UA: 100
RBC UA: NEGATIVE
SPEC GRAV UA: 1.01
Urobilinogen, UA: 0.2
pH, UA: 5.5

## 2016-02-06 LAB — POCT UA - MICROSCOPIC ONLY

## 2016-02-06 LAB — POCT GLYCOSYLATED HEMOGLOBIN (HGB A1C): Hemoglobin A1C: 7.8

## 2016-02-06 MED ORDER — IPRATROPIUM BROMIDE 0.02 % IN SOLN
0.5000 mg | Freq: Once | RESPIRATORY_TRACT | Status: AC
  Administered 2016-02-06: 0.5 mg via RESPIRATORY_TRACT

## 2016-02-06 MED ORDER — PREDNISONE 50 MG PO TABS: 50.0000 mg | ORAL_TABLET | Freq: Every day | ORAL | 0 refills | Status: DC

## 2016-02-06 MED ORDER — LEVOFLOXACIN 500 MG PO TABS: 500.0000 mg | ORAL_TABLET | Freq: Every day | ORAL | 0 refills | Status: DC

## 2016-02-06 MED ORDER — IPRATROPIUM-ALBUTEROL 0.5-2.5 (3) MG/3ML IN SOLN: 3.0000 mL | RESPIRATORY_TRACT | 0 refills | Status: DC | PRN

## 2016-02-06 MED ORDER — ALBUTEROL SULFATE (2.5 MG/3ML) 0.083% IN NEBU
2.5000 mg | INHALATION_SOLUTION | Freq: Once | RESPIRATORY_TRACT | Status: AC
  Administered 2016-02-06: 2.5 mg via RESPIRATORY_TRACT

## 2016-02-06 NOTE — Patient Instructions (Addendum)
Thank you so much for coming to visit today! I suspect your shortness of breath is due to a COPD exacerbation. A small pneumonia is also possible. I have prescribed a course of steroids and antibiotics, which will treat both. If your symptoms due not improve over the next several days or worsens, please let us know. If your shortness of breath becomes significantly worse, please call 911.  Dr. Gerlean Ren

## 2016-02-06 NOTE — Telephone Encounter (Signed)
pres[facility] from to[facility]. Please send them to [facility] in [place]

## 2016-02-06 NOTE — Progress Notes (Signed)
**  After Hours/ Emergency Line Call*  Received a call from [facility] daughter that Pt was seen by Dr. Gerlean Ren in clinic today. She was prescribed Levaquin, Prednisone, and Duonebs, but Dr. Gerlean Ren sent these prescriptions into [facility] and not into a pharmacy. Ms. Pedigo daughter is requesting that I send in the prescriptions to [facility]. I sent in the prescriptions.  Will forward to PCP.  Evette Doffing, MD PGY-2, [facility]

## 2016-02-08 ENCOUNTER — Telehealth: Payer: Self-pay | Admitting: Obstetrics and Gynecology

## 2016-02-08 NOTE — Assessment & Plan Note (Signed)
-   Continue Amlodipine, Coreg - Continue to monitor. Suspect dizziness is from acute illness. Consider decreasing blood pressure medication if no improvement with resolution of illness.

## 2016-02-08 NOTE — Assessment & Plan Note (Signed)
-   Suspect COPD exacerbation given cough and wheezing. - Levaquin and Prednisone initiated. Discussed that she may expect an increase in blood sugar - Requested Duonebs to use with home nebulizer. Prescription sent to get through acute period.

## 2016-02-08 NOTE — Progress Notes (Signed)
Subjective:     Patient ID: Natalie Porter, female   DOB: 1940/07/23, 75 y.o.   MRN: [id number]  HPI Natalie Porter is a 75yo female presenting today for cough. - Reports coughing for the last 2-3 days - Temperature of 100.4 noted - Cough has started to affect sleep - Cough is productive - Has been using cough medicine and throat spray, which has been helping some - Has been using Albuterol 4-5 times per day, which will help for a few minutes - History of Stress Incontinence. Has been worse with cough. - Symptoms seem to be worsening - Sick Contact: Great Grand-daughter with sinus infection - Notes dizziness when standing up too fast. Has only been present for the last several days with cough.  - Former Smoker  Review of Systems  Per HPI     Objective:   Physical Exam  Constitutional: She appears well-developed and well-nourished. No distress.  HENT:  Head: Normocephalic and atraumatic.  Eyes:  Conjunctiva pink  Cardiovascular: Normal rate and regular rhythm.   No murmur heard. Pulmonary/Chest: Effort normal. No respiratory distress.  Bilateral wheezing noted. Some crackles in bases bilaterally.   Orthostatic Vital Signs: Lying- 149/45, 51 Sitting- 152/48, 52 Standing- 154/50, 53 (1 minute); 154/53, 50 (3 minute)    Assessment and Plan:     COPD (chronic obstructive pulmonary disease) - Suspect COPD exacerbation given cough and wheezing. - Levaquin and Prednisone initiated. Discussed that she may expect an increase in blood sugar - Requested Duonebs to use with home nebulizer. Prescription sent to get through acute period.  Hypertension - Continue Amlodipine, Coreg - Continue to monitor. Suspect dizziness is from acute illness. Consider decreasing blood pressure medication if no improvement with resolution of illness.

## 2016-02-08 NOTE — Telephone Encounter (Signed)
Family Medicine Emergency Line Telephone Note   Patient seen in clinic on 11/16 and diagnosed with COPD exacerbation. She was given prednisone and antibiotics,. Patient's daughter states that tonight when checking PM blood sugars patient noted to have elevated blood sugar to 442. Patient is worried about her kidney function with increase in sugars. Reassured patient that elevation likely due to steroids. She received her nighttime Lantus of 30U about 2 hours ago. Has no short acting insulins at home. Discussed with family and patient that this elevation is common with steroids and would just monitor. If sugars continue to be elevated all day tomorrow could increase Lantus by 5U for nighttime dose for duration of steroids and then come back to regular 30U.   Caregiver voiced understanding and agreed to plan.   Luiz Blare, DO 02/08/2016, 1:25 AM PGY-3, [employer]

## 2016-02-17 ENCOUNTER — Other Ambulatory Visit: Payer: Self-pay | Admitting: Student in an Organized Health Care Education/Training Program

## 2016-02-17 DIAGNOSIS — R062 Wheezing: Secondary | ICD-10-CM

## 2016-02-17 NOTE — Telephone Encounter (Signed)
Needs refills on everything.  Valsartan '80mg'$  and free style test strips.  [facility].

## 2016-02-18 MED ORDER — TIOTROPIUM BROMIDE MONOHYDRATE 18 MCG IN CAPS: 18.0000 ug | ORAL_CAPSULE | Freq: Every day | RESPIRATORY_TRACT | 3 refills | Status: AC

## 2016-02-18 MED ORDER — OMEPRAZOLE 20 MG PO CPDR: 20.0000 mg | DELAYED_RELEASE_CAPSULE | Freq: Every day | ORAL | 2 refills | Status: AC

## 2016-02-18 MED ORDER — GLUCOSE BLOOD VI STRP: ORAL_STRIP | 12 refills | Status: AC

## 2016-02-18 MED ORDER — CARVEDILOL 25 MG PO TABS: 25.0000 mg | ORAL_TABLET | Freq: Two times a day (BID) | ORAL | 3 refills | Status: AC

## 2016-02-18 MED ORDER — ALBUTEROL SULFATE HFA 108 (90 BASE) MCG/ACT IN AERS: 2.0000 | INHALATION_SPRAY | RESPIRATORY_TRACT | 1 refills | Status: AC | PRN

## 2016-02-18 MED ORDER — IPRATROPIUM-ALBUTEROL 0.5-2.5 (3) MG/3ML IN SOLN: 3.0000 mL | RESPIRATORY_TRACT | 0 refills | Status: AC | PRN

## 2016-02-18 MED ORDER — GABAPENTIN 100 MG PO CAPS: 200.0000 mg | ORAL_CAPSULE | Freq: Two times a day (BID) | ORAL | 3 refills | Status: AC

## 2016-02-18 MED ORDER — AMLODIPINE BESYLATE 10 MG PO TABS: 10.0000 mg | ORAL_TABLET | Freq: Every day | ORAL | 3 refills | Status: AC

## 2016-02-18 MED ORDER — ATORVASTATIN CALCIUM 80 MG PO TABS: 80.0000 mg | ORAL_TABLET | Freq: Every day | ORAL | 1 refills | Status: DC

## 2016-02-18 MED ORDER — VALSARTAN 80 MG PO TABS: 80.0000 mg | ORAL_TABLET | Freq: Every day | ORAL | 11 refills | Status: DC

## 2016-02-18 MED ORDER — FLUTICASONE PROPIONATE 50 MCG/ACT NA SUSP: 2.0000 | Freq: Every day | NASAL | 3 refills | Status: AC

## 2016-02-18 MED ORDER — INSULIN GLARGINE 100 UNIT/ML ~~LOC~~ SOLN: 30.0000 [IU] | Freq: Every day | SUBCUTANEOUS | 2 refills | Status: DC

## 2016-02-18 MED ORDER — FUROSEMIDE 80 MG PO TABS: 80.0000 mg | ORAL_TABLET | Freq: Every day | ORAL | 3 refills | Status: AC

## 2016-02-18 MED ORDER — ESCITALOPRAM OXALATE 20 MG PO TABS: 20.0000 mg | ORAL_TABLET | Freq: Every day | ORAL | 0 refills | Status: DC

## 2016-02-18 NOTE — Telephone Encounter (Signed)
Received a refill request for 15 medications. Spoke with patient's daughter to confirm refills, sent refills as appropriate. Patient also takes valsartan 80 mg daily from previous PCP which has never been confirmed in our computer; will order.

## 2016-02-20 DIAGNOSIS — H34813 Central retinal vein occlusion, bilateral, with macular edema: Secondary | ICD-10-CM | POA: Diagnosis not present

## 2016-02-24 ENCOUNTER — Encounter: Payer: Self-pay | Admitting: Vascular Surgery

## 2016-02-26 DIAGNOSIS — R911 Solitary pulmonary nodule: Secondary | ICD-10-CM | POA: Diagnosis not present

## 2016-02-26 DIAGNOSIS — C3412 Malignant neoplasm of upper lobe, left bronchus or lung: Secondary | ICD-10-CM | POA: Diagnosis not present

## 2016-02-26 DIAGNOSIS — C3490 Malignant neoplasm of unspecified part of unspecified bronchus or lung: Secondary | ICD-10-CM | POA: Diagnosis not present

## 2016-02-26 DIAGNOSIS — D619 Aplastic anemia, unspecified: Secondary | ICD-10-CM | POA: Diagnosis not present

## 2016-02-26 DIAGNOSIS — D649 Anemia, unspecified: Secondary | ICD-10-CM | POA: Diagnosis not present

## 2016-02-26 DIAGNOSIS — C3431 Malignant neoplasm of lower lobe, right bronchus or lung: Secondary | ICD-10-CM | POA: Diagnosis not present

## 2016-02-27 ENCOUNTER — Ambulatory Visit (HOSPITAL_COMMUNITY)
Admission: RE | Admit: 2016-02-27 | Discharge: 2016-02-27 | Disposition: A | Discharge status: Home / Self Care | Payer: Medicare Other | Source: Ambulatory Visit | Attending: Vascular Surgery | Admitting: Vascular Surgery

## 2016-02-27 ENCOUNTER — Ambulatory Visit: Payer: Medicare Other | Admitting: Vascular Surgery

## 2016-02-27 ENCOUNTER — Encounter (HOSPITAL_COMMUNITY): Payer: Medicare Other

## 2016-02-27 ENCOUNTER — Encounter: Payer: Self-pay | Admitting: Vascular Surgery

## 2016-02-27 ENCOUNTER — Ambulatory Visit (INDEPENDENT_AMBULATORY_CARE_PROVIDER_SITE_OTHER): Payer: Medicare Other | Admitting: Vascular Surgery

## 2016-02-27 VITALS — BP 126/48 | HR 52 | Temp 97.9°F | Resp 20 | Ht 62.0 in | Wt 180.8 lb

## 2016-02-27 DIAGNOSIS — Z95828 Presence of other vascular implants and grafts: Secondary | ICD-10-CM | POA: Insufficient documentation

## 2016-02-27 DIAGNOSIS — I739 Peripheral vascular disease, unspecified: Secondary | ICD-10-CM

## 2016-02-27 DIAGNOSIS — N189 Chronic kidney disease, unspecified: Secondary | ICD-10-CM | POA: Diagnosis not present

## 2016-02-27 DIAGNOSIS — E875 Hyperkalemia: Secondary | ICD-10-CM | POA: Diagnosis not present

## 2016-02-27 DIAGNOSIS — Z87891 Personal history of nicotine dependence: Secondary | ICD-10-CM | POA: Insufficient documentation

## 2016-02-27 DIAGNOSIS — D649 Anemia, unspecified: Secondary | ICD-10-CM | POA: Diagnosis not present

## 2016-02-27 DIAGNOSIS — E785 Hyperlipidemia, unspecified: Secondary | ICD-10-CM | POA: Diagnosis not present

## 2016-02-27 DIAGNOSIS — I2510 Atherosclerotic heart disease of native coronary artery without angina pectoris: Secondary | ICD-10-CM | POA: Diagnosis not present

## 2016-02-27 DIAGNOSIS — R911 Solitary pulmonary nodule: Secondary | ICD-10-CM | POA: Diagnosis not present

## 2016-02-27 DIAGNOSIS — C3412 Malignant neoplasm of upper lobe, left bronchus or lung: Secondary | ICD-10-CM | POA: Diagnosis not present

## 2016-02-27 DIAGNOSIS — E279 Disorder of adrenal gland, unspecified: Secondary | ICD-10-CM | POA: Diagnosis not present

## 2016-02-27 DIAGNOSIS — I10 Essential (primary) hypertension: Secondary | ICD-10-CM | POA: Diagnosis not present

## 2016-02-27 DIAGNOSIS — E1151 Type 2 diabetes mellitus with diabetic peripheral angiopathy without gangrene: Secondary | ICD-10-CM | POA: Diagnosis not present

## 2016-02-27 DIAGNOSIS — R938 Abnormal findings on diagnostic imaging of other specified body structures: Secondary | ICD-10-CM | POA: Diagnosis not present

## 2016-02-27 DIAGNOSIS — Z85118 Personal history of other malignant neoplasm of bronchus and lung: Secondary | ICD-10-CM | POA: Diagnosis not present

## 2016-02-27 NOTE — Progress Notes (Signed)
[facility]   Patient name: Natalie Porter           MRN: [id number]        DOB: 05-16-1940            Sex: female   REASON FOR VISIT: follow-up CTA   HPI: Natalie Porter is a 75 y.o. female who returns for follow-up. She is s/p right to left femoral-femoral bypass and right common iliac artery by Dr. Oneida Alar. She is minimally ambulatory and reports swelling in her legs. She denies claudication or rest pain. She had a calf wound recently and this healed spontaneously after 2 months. She was noted on duplex ultrasound 6 months ago to have elevated velocities in the left subclavian artery suggestive of stenosis. She denies any left upper extremity fatigue symptoms.   The patient reports increased shortness of breath today. She currently is undergoing radiation therapy for lung cancer. She has congestive heart failure but would prefer to see another cardiologist for management.         Past Medical History  Diagnosis Date  . Diabetes mellitus without complication ([place])    . Hypertension    . Renal disorder    . Gallstone    . Arthritis    . DVT (deep venous thrombosis) ([place])        2011  . COPD (chronic obstructive pulmonary disease) ([place])    . Pneumonia 2014  . Neuropathy ([place])    . GERD (gastroesophageal reflux disease)    . Constipation    . Anemia    . Hypercholesteremia    . CHF (congestive heart failure) ([place])    . Iron deficiency anemia    . Vitamin B12 deficiency    . PVD (peripheral vascular disease) ([place])    . Osteoarthritis    . CAD (coronary artery disease)              Family History  Problem Relation Age of Onset  . Heart attack Mother    . Stroke Father    . Heart attack Father    . Cancer Sister        [name]  . Cancer Brother    . Cancer Sister        Lung      SOCIAL HISTORY:      Social History  Substance Use Topics  . Smoking status: Former Smoker -- 1.00 packs/day for 34 years      Types: Cigarettes   Quit date: 03/28/1988  . Smokeless tobacco: Never Used  . Alcohol Use: No          Allergies  Allergen Reactions  . Codeine Nausea Only  . Morphine And Related Nausea And Vomiting       Current Outpatient Prescriptions on File Prior to Visit  Medication Sig Dispense Refill  . albuterol (PROVENTIL HFA;VENTOLIN HFA) 108 (90 Base) MCG/ACT inhaler Inhale 2 puffs into the lungs every 4 (four) hours as needed for wheezing or shortness of breath. 1 Inhaler 1  . amLODipine (NORVASC) 10 MG tablet Take 1 tablet (10 mg total) by mouth daily. 90 tablet 3  . aspirin EC 81 MG tablet Take 81 mg by mouth daily.    Marland Kitchen atorvastatin (LIPITOR) 80 MG tablet Take 1 tablet (80 mg total) by mouth daily. 90 tablet 1  . Calcium Carbonate (CALCIUM-CARB 600 PO) Take 600 mg by mouth 2 (two) times daily.    . carvedilol (COREG) 25 MG tablet  Take 1 tablet (25 mg total) by mouth 2 (two) times daily with a meal. 60 tablet 3  . escitalopram (LEXAPRO) 20 MG tablet Take 1 tablet (20 mg total) by mouth daily. 30 tablet 0  . fluticasone (FLONASE) 50 MCG/ACT nasal spray Place 2 sprays into both nostrils daily. 16 g 3  . furosemide (LASIX) 80 MG tablet Take 1 tablet (80 mg total) by mouth daily. 90 tablet 3  . gabapentin (NEURONTIN) 100 MG capsule Take 2-3 capsules (200-300 mg total) by mouth 2 (two) times daily. 200 mg in the morning and 300 mg at bedtime 120 capsule 3  . glucose blood test strip Use as instructed 100 each 12  . insulin glargine (LANTUS) 100 UNIT/ML injection Inject 0.3 mLs (30 Units total) into the skin at bedtime. 10 mL 2  . ipratropium-albuterol (DUONEB) 0.5-2.5 (3) MG/3ML SOLN Take 3 mLs by nebulization every 4 (four) hours as needed. 360 mL 0  . tiotropium (SPIRIVA) 18 MCG inhalation capsule Place 1 capsule (18 mcg total) into inhaler and inhale daily. 30 capsule 3  . valsartan (DIOVAN) 80 MG tablet Take 1 tablet (80 mg total) by mouth daily. 30 tablet 11  . acetaminophen-codeine (TYLENOL #2) 300-15 MG  tablet Take 1 tablet by mouth every 4 (four) hours as needed for moderate pain. (Patient not taking: Reported on 02/27/2016) 30 tablet 0  . levofloxacin (LEVAQUIN) 500 MG tablet Take 1 tablet (500 mg total) by mouth daily. (Patient not taking: Reported on 02/27/2016) 7 tablet 0  . omeprazole (PRILOSEC) 20 MG capsule Take 1 capsule (20 mg total) by mouth daily. (Patient not taking: Reported on 02/27/2016) 30 capsule 2  . predniSONE (DELTASONE) 50 MG tablet Take 1 tablet (50 mg total) by mouth daily with breakfast. (Patient not taking: Reported on 02/27/2016) 5 tablet 0   No current facility-administered medications on file prior to visit.      REVIEW OF SYSTEMS:  '[X]'$  denotes positive finding, '[ ]'$  denotes negative finding Cardiac   Comments:  Chest pain or chest pressure:      Shortness of breath upon exertion: x    Short of breath when lying flat:      Irregular heart rhythm:             Vascular      Pain in calf, thigh, or hip brought on by ambulation:      Pain in feet at night that wakes you up from your sleep:       Blood clot in your veins:      Leg swelling:              Pulmonary      Oxygen at home:      Productive cough:       Wheezing:              Neurologic      Sudden weakness in arms or legs:       Sudden numbness in arms or legs:       Sudden onset of difficulty speaking or slurred speech:      Temporary loss of vision in one eye:       Problems with dizziness:              Gastrointestinal      Blood in stool:       Vomited blood:              Genitourinary      Burning  when urinating:       Blood in urine:             Psychiatric      Major depression:              Hematologic      Bleeding problems:      Problems with blood clotting too easily:             Skin      Rashes or ulcers:             Constitutional      Fever or chills:          PHYSICAL EXAM:  Vitals:   02/27/16 1152  BP: (!) 139/54  Pulse: (!) 52  Resp: 20  Temp: 97.9 F (36.6  C)  TempSrc: Oral  SpO2: 97%  Weight: 180 lb 12.8 oz (82 kg)  Height: '5\' 2"'$  (1.575 m)     GENERAL: The patient is a well-nourished female, in no acute distress. The vital signs are documented above. VASCULAR: 2+ right dorsalis pedis pulse 1+ left dorsalis pedis pulse, 2+ brachial pulses bilaterally PULMONARY: Non labored breathing, distant breath sounds Cardiac: Bilateral carotid bruits left greater than right, 3/6 systolic cardiac murmur regular rate and rhythm Abdomen: 2+ femoral femoral graft pulse NEUROLOGIC: No focal weakness or paresthesias are detected. PSYCHIATRIC: The patient has a normal affect.   DATA:  Patient had bilateral ABIs today which I reviewed and interpreted. Left side 0.96 right side 0.94 toe pressure 106 on the right 72 on the left     MEDICAL ISSUES:   Peripheral vascular disease: Patent femoral-femoral bypass   She will follow up in one year with our nurse practitioner with repeat ABIs.   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  No plans for intervention unless she develops left upper extremity exertional fatigue  Carotid occlusive disease: Follow-up carotid duplex scan in 1 year     Ruta Hinds, MD [facility] [place] Office: [phone] Pager: [phone]

## 2016-02-27 NOTE — Progress Notes (Signed)
Vitals:   02/27/16 1152 02/27/16 1221  BP: (!) 139/54 (!) 126/48  Pulse: (!) 52   Resp: 20   Temp: 97.9 F (36.6 C)   TempSrc: Oral   SpO2: 97%   Weight: 180 lb 12.8 oz (82 kg)   Height: '5\' 2"'$  (1.575 m)

## 2016-02-28 NOTE — Addendum Note (Signed)
Addended by: Lianne Cure A on: 02/28/2016 08:35 AM   Modules accepted: Orders

## 2016-03-02 ENCOUNTER — Other Ambulatory Visit (HOSPITAL_COMMUNITY)
Admission: RE | Admit: 2016-03-02 | Discharge: 2016-03-02 | Disposition: A | Discharge status: Home / Self Care | Payer: Medicare Other | Source: Ambulatory Visit | Attending: Oncology | Admitting: Oncology

## 2016-03-02 DIAGNOSIS — E538 Deficiency of other specified B group vitamins: Secondary | ICD-10-CM | POA: Diagnosis not present

## 2016-03-02 DIAGNOSIS — C3412 Malignant neoplasm of upper lobe, left bronchus or lung: Secondary | ICD-10-CM | POA: Diagnosis not present

## 2016-03-02 DIAGNOSIS — D649 Anemia, unspecified: Secondary | ICD-10-CM | POA: Diagnosis not present

## 2016-03-02 DIAGNOSIS — C3431 Malignant neoplasm of lower lobe, right bronchus or lung: Secondary | ICD-10-CM | POA: Insufficient documentation

## 2016-03-06 ENCOUNTER — Encounter: Payer: Self-pay | Admitting: Radiation Oncology

## 2016-03-13 ENCOUNTER — Ambulatory Visit (INDEPENDENT_AMBULATORY_CARE_PROVIDER_SITE_OTHER): Payer: Medicare Other | Admitting: Family Medicine

## 2016-03-13 ENCOUNTER — Encounter: Payer: Self-pay | Admitting: Family Medicine

## 2016-03-13 VITALS — BP 138/42 | HR 51 | Temp 97.9°F | Ht 62.0 in | Wt 172.6 lb

## 2016-03-13 DIAGNOSIS — I2510 Atherosclerotic heart disease of native coronary artery without angina pectoris: Secondary | ICD-10-CM | POA: Diagnosis not present

## 2016-03-13 DIAGNOSIS — Z23 Encounter for immunization: Secondary | ICD-10-CM | POA: Diagnosis present

## 2016-03-13 DIAGNOSIS — K5900 Constipation, unspecified: Secondary | ICD-10-CM

## 2016-03-13 MED ORDER — BISACODYL 10 MG RE SUPP: 10.0000 mg | RECTAL | 0 refills | Status: AC | PRN

## 2016-03-13 MED ORDER — POLYETHYLENE GLYCOL 3350 17 GM/SCOOP PO POWD: 17.0000 g | Freq: Three times a day (TID) | ORAL | 0 refills | Status: AC | PRN

## 2016-03-13 NOTE — Assessment & Plan Note (Addendum)
Exam and history consistent with constipation. Normoactive bowel sounds, continued BMs, and only abdominal surgery BTL therefore less likely for SBO (however pt does have a h/o lung cancer but PET scan from 07/2015 with no evidence of abdominal or pelvic mets). Abdominal exam reassuring, pain is not out of proportion to exam.  - dulcolax suppository and MiraLax TID PRN. - discussed return precautions.

## 2016-03-13 NOTE — Progress Notes (Signed)
Subjective: CC: concerns for "obstruction" HPI: Patient is a 75 y.o. female with a past medical history of constipation presenting to clinic today for concerns for obstruction.  She notes abdominal pain x 1 week. Pain was diffuse but she notes "somewhat normal" BMs initially. Yesterday, she started to develop diarrhea. She notes explosive watery diarrhea several times per day. Abdominal pain is mostly left sided but sometimes migrates to the right side. She notes its pressure and feels like there is stuff stuck in there.  Her last BM was prior to coming to this appt.  She also had 2 episodes of emesis (once yesterday evening and once this morning). Emesis was mucosy, NB/NB  No fever or chills. No hematochezia or melana.   She got small dose of milk of magnesium and water. They also did a enema (Fleet) but she couldn't keep it in.   No abdominal surgeries bedsides BTL. She notes she last had an "obstruction" when she was in the hospital in July of this year. Looking at the EMR, she had constipation, but no issues with obstruction.   Social History: former smoker  Flu Vaccine: due, will get today  Pneumonia Vaccine: due, will get today.   ROS: All other systems reviewed and are negative.  Past Medical History Patient Active Problem List   Diagnosis Date Noted  . S/P insertion of iliac artery stent 02/27/2016  . History of arterial bypass of lower extremity 02/27/2016  . Acute upper respiratory infection 01/12/2016  . Hyperkalemia 10/27/2015  . Constipation 10/09/2015  . Primary cancer of left upper lobe of lung ([place]) 10/09/2015  . UTI (urinary tract infection) 10/09/2015  . HCAP (healthcare-associated pneumonia) 09/27/2015  . Dyspnea 09/27/2015  . OSA on CPAP 07/08/2015  . CAD in native artery 07/08/2015  . Primary cancer of right lower lobe of lung ([place]) 03/07/2015  . COPD (chronic obstructive pulmonary disease) ([place]) 12/11/2014  . Peripheral vascular disease, unspecified  11/23/2013  . Nonhealing nonsurgical wound 08/17/2013  . Incisional infection 08/11/2013  . CKD (chronic kidney disease) stage 3, GFR 30-59 ml/min 08/09/2013  . Cardiorenal syndrome 08/02/2013  . Diastolic CHF, acute on chronic (HCC) 07/30/2013  . Anemia 07/30/2013  . Hypertension 07/30/2013  . HLD (hyperlipidemia) 07/30/2013  . PAD (peripheral artery disease) ([place]) 07/21/2013  . Chronic kidney disease (CKD), stage III (moderate) 06/17/2013  . 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 Leg pain 06/15/2013  . Long term current use of anticoagulant 12/20/2012  . Elevated erythrocyte sedimentation rate 10/20/2012  . Carotid artery narrowing 07/27/2012  . Clinical depression 07/27/2012  . Diabetes mellitus, type 2 ([place]) 07/27/2012  . Difficulty hearing 07/27/2012  . Adult BMI 30+ 06/21/2012  . Adiposity 06/21/2012  . History of deep venous thrombosis 12/24/2011  . Vascular disorder of lower extremity 12/24/2011  . CAFL (chronic airflow limitation) ([place]) 09/15/2011  . Lung nodule, multiple 09/15/2011  . Biliary calculi 08/04/2011  . Allergic rhinitis 07/24/2011  . Obstructive apnea 07/24/2011  . Arthritis, degenerative 07/24/2011  . Ex-cigarette smoker 07/23/2011  . Personal history of nicotine dependence 07/23/2011  . 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 BP (!) 138/42   Pulse (!) 51   Temp 97.9 F (36.6 C) (Oral)   Ht '5\' 2"'$  (1.575 m)   Wt 172 lb 9.6 oz (78.3 kg)   BMI 31.57 kg/m    Physical Examination:  General: Awake, alert, well- nourished, NAD Cardio:  RRR, no m/r/g noted. Trace pitting edema.  Pulm: No increased WOB.  CTAB, without wheezes, rhonchi or crackles noted.  GI: +BS, soft, non-distended, diffusely tender without rebound or guarding.   Assessment/Plan: Constipation Exam and history consistent with constipation. Normoactive bowel sounds,  continued BMs, and only abdominal surgery BTL therefore less likely for SBO (however pt does have a h/o lung cancer but PET scan from 07/2015 with no evidence of abdominal or pelvic mets). Abdominal exam reassuring, pain is not out of proportion to exam.  - dulcolax suppository and MiraLax TID PRN. - discussed return precautions.    Orders Placed This Encounter  Procedures  . Flu Vaccine QUAD 36+ mos IM  . Pneumococcal polysaccharide vaccine 23-valent greater than or equal to 2yo subcutaneous/IM    Meds ordered this encounter  Medications  . polyethylene glycol powder (GLYCOLAX/MIRALAX) powder    Sig: Take 17 g by mouth 3 (three) times daily as needed for moderate constipation.    Dispense:  500 g    Refill:  0  . bisacodyl (DULCOLAX) 10 MG suppository    Sig: Place 1 suppository (10 mg total) rectally as needed for moderate constipation.    Dispense:  6 suppository    Refill:  [place] PGY-3, [place]

## 2016-03-13 NOTE — Patient Instructions (Signed)
I prescribed MiraLAX that she should take 3 times a day to help with bowel movements. Once her bowel movements are improved, she can go to once daily. I have also prescribed a Dulcolax suppository that is used rectally as needed for constipation.  Try to continue to take in frequent small amounts of fluids. Please try get up slowly and change positions slowly to prevent dizziness and falls.  If your symptoms do not improve, your abdominal pain worsens, you are unable to have a bowel movement and pass gas, please contact our office.   Constipation, Adult Constipation is when a person:  Poops (has a bowel movement) fewer times in a week than normal.  Has a hard time pooping.  Has poop that is dry, hard, or bigger than normal. Follow these instructions at home: Eating and drinking  Eat foods that have a lot of fiber, such as:  Fresh fruits and vegetables.  Whole grains.  Beans.  Eat less of foods that are high in fat, low in fiber, or overly processed, such as:  [place] fries.  Hamburgers.  Cookies.  Candy.  Soda.  Drink enough fluid to keep your pee (urine) clear or pale yellow. General instructions  Exercise regularly or as told by your doctor.  Go to the restroom when you feel like you need to poop. Do not hold it in.  Take over-the-counter and prescription medicines only as told by your doctor. These include any fiber supplements.  Do pelvic floor retraining exercises, such as:  Doing deep breathing while relaxing your lower belly (abdomen).  Relaxing your pelvic floor while pooping.  Watch your condition for any changes.  Keep all follow-up visits as told by your doctor. This is important. Contact a doctor if:  You have pain that gets worse.  You have a fever.  You have not pooped for 4 days.  You throw up (vomit).  You are not hungry.  You lose weight.  You are bleeding from the anus.  You have thin, pencil-like poop (stool). Get help  right away if:  You have a fever, and your symptoms suddenly get worse.  You leak poop or have blood in your poop.  Your belly feels hard or bigger than normal (is bloated).  You have very bad belly pain.  You feel dizzy or you faint. This information is not intended to replace advice given to you by your health care provider. Make sure you discuss any questions you have with your health care provider. Document Released: 08/26/2007 Document Revised: 09/27/2015 Document Reviewed: 08/28/2015 Elsevier Interactive Patient Education  2017 [employer].

## 2016-03-19 ENCOUNTER — Encounter (HOSPITAL_COMMUNITY): Payer: Self-pay

## 2016-03-20 ENCOUNTER — Encounter (HOSPITAL_COMMUNITY): Payer: Self-pay

## 2016-03-23 DIAGNOSIS — Z9289 Personal history of other medical treatment: Secondary | ICD-10-CM

## 2016-03-23 HISTORY — DX: Personal history of other medical treatment: Z92.89

## 2016-03-24 DIAGNOSIS — J111 Influenza due to unidentified influenza virus with other respiratory manifestations: Secondary | ICD-10-CM | POA: Diagnosis not present

## 2016-04-01 ENCOUNTER — Inpatient Hospital Stay (HOSPITAL_COMMUNITY)
Admission: EM | Admit: 2016-04-01 | Discharge: 2016-04-03 | DRG: 191 | Disposition: A | Discharge status: Home / Self Care | Payer: Medicare Other | Attending: Family Medicine | Admitting: Family Medicine

## 2016-04-01 ENCOUNTER — Emergency Department (HOSPITAL_COMMUNITY): Payer: Medicare Other

## 2016-04-01 ENCOUNTER — Encounter: Payer: Self-pay | Admitting: Student in an Organized Health Care Education/Training Program

## 2016-04-01 ENCOUNTER — Encounter (HOSPITAL_COMMUNITY): Payer: Self-pay | Admitting: Emergency Medicine

## 2016-04-01 ENCOUNTER — Ambulatory Visit (INDEPENDENT_AMBULATORY_CARE_PROVIDER_SITE_OTHER): Payer: Medicare Other | Admitting: Student in an Organized Health Care Education/Training Program

## 2016-04-01 VITALS — BP 140/58 | HR 57 | Temp 98.9°F | Wt 177.0 lb

## 2016-04-01 DIAGNOSIS — K219 Gastro-esophageal reflux disease without esophagitis: Secondary | ICD-10-CM | POA: Diagnosis present

## 2016-04-01 DIAGNOSIS — E860 Dehydration: Secondary | ICD-10-CM | POA: Diagnosis present

## 2016-04-01 DIAGNOSIS — D649 Anemia, unspecified: Secondary | ICD-10-CM

## 2016-04-01 DIAGNOSIS — Z79899 Other long term (current) drug therapy: Secondary | ICD-10-CM

## 2016-04-01 DIAGNOSIS — I5032 Chronic diastolic (congestive) heart failure: Secondary | ICD-10-CM | POA: Diagnosis present

## 2016-04-01 DIAGNOSIS — C3412 Malignant neoplasm of upper lobe, left bronchus or lung: Secondary | ICD-10-CM | POA: Diagnosis present

## 2016-04-01 DIAGNOSIS — Z8701 Personal history of pneumonia (recurrent): Secondary | ICD-10-CM | POA: Diagnosis not present

## 2016-04-01 DIAGNOSIS — Z8249 Family history of ischemic heart disease and other diseases of the circulatory system: Secondary | ICD-10-CM | POA: Diagnosis not present

## 2016-04-01 DIAGNOSIS — C3431 Malignant neoplasm of lower lobe, right bronchus or lung: Secondary | ICD-10-CM | POA: Diagnosis present

## 2016-04-01 DIAGNOSIS — N183 Chronic kidney disease, stage 3 (moderate): Secondary | ICD-10-CM | POA: Diagnosis present

## 2016-04-01 DIAGNOSIS — J441 Chronic obstructive pulmonary disease with (acute) exacerbation: Secondary | ICD-10-CM

## 2016-04-01 DIAGNOSIS — Z923 Personal history of irradiation: Secondary | ICD-10-CM

## 2016-04-01 DIAGNOSIS — Z87891 Personal history of nicotine dependence: Secondary | ICD-10-CM | POA: Diagnosis not present

## 2016-04-01 DIAGNOSIS — E1121 Type 2 diabetes mellitus with diabetic nephropathy: Secondary | ICD-10-CM | POA: Diagnosis present

## 2016-04-01 DIAGNOSIS — J440 Chronic obstructive pulmonary disease with acute lower respiratory infection: Secondary | ICD-10-CM | POA: Diagnosis present

## 2016-04-01 DIAGNOSIS — D638 Anemia in other chronic diseases classified elsewhere: Secondary | ICD-10-CM | POA: Diagnosis present

## 2016-04-01 DIAGNOSIS — Z794 Long term (current) use of insulin: Secondary | ICD-10-CM | POA: Diagnosis not present

## 2016-04-01 DIAGNOSIS — E538 Deficiency of other specified B group vitamins: Secondary | ICD-10-CM | POA: Diagnosis present

## 2016-04-01 DIAGNOSIS — R748 Abnormal levels of other serum enzymes: Secondary | ICD-10-CM | POA: Diagnosis not present

## 2016-04-01 DIAGNOSIS — N179 Acute kidney failure, unspecified: Secondary | ICD-10-CM | POA: Diagnosis present

## 2016-04-01 DIAGNOSIS — R778 Other specified abnormalities of plasma proteins: Secondary | ICD-10-CM

## 2016-04-01 DIAGNOSIS — I2510 Atherosclerotic heart disease of native coronary artery without angina pectoris: Secondary | ICD-10-CM | POA: Diagnosis present

## 2016-04-01 DIAGNOSIS — Z7982 Long term (current) use of aspirin: Secondary | ICD-10-CM

## 2016-04-01 DIAGNOSIS — R0602 Shortness of breath: Secondary | ICD-10-CM

## 2016-04-01 DIAGNOSIS — I130 Hypertensive heart and chronic kidney disease with heart failure and stage 1 through stage 4 chronic kidney disease, or unspecified chronic kidney disease: Secondary | ICD-10-CM | POA: Diagnosis present

## 2016-04-01 DIAGNOSIS — E1151 Type 2 diabetes mellitus with diabetic peripheral angiopathy without gangrene: Secondary | ICD-10-CM | POA: Diagnosis not present

## 2016-04-01 DIAGNOSIS — Z86718 Personal history of other venous thrombosis and embolism: Secondary | ICD-10-CM

## 2016-04-01 DIAGNOSIS — E785 Hyperlipidemia, unspecified: Secondary | ICD-10-CM | POA: Diagnosis present

## 2016-04-01 DIAGNOSIS — J418 Mixed simple and mucopurulent chronic bronchitis: Secondary | ICD-10-CM

## 2016-04-01 DIAGNOSIS — K5900 Constipation, unspecified: Secondary | ICD-10-CM | POA: Diagnosis present

## 2016-04-01 DIAGNOSIS — D509 Iron deficiency anemia, unspecified: Secondary | ICD-10-CM | POA: Diagnosis present

## 2016-04-01 DIAGNOSIS — Z7951 Long term (current) use of inhaled steroids: Secondary | ICD-10-CM

## 2016-04-01 DIAGNOSIS — Z823 Family history of stroke: Secondary | ICD-10-CM

## 2016-04-01 DIAGNOSIS — R7989 Other specified abnormal findings of blood chemistry: Secondary | ICD-10-CM

## 2016-04-01 DIAGNOSIS — R05 Cough: Secondary | ICD-10-CM | POA: Diagnosis not present

## 2016-04-01 DIAGNOSIS — J449 Chronic obstructive pulmonary disease, unspecified: Secondary | ICD-10-CM | POA: Diagnosis not present

## 2016-04-01 DIAGNOSIS — J189 Pneumonia, unspecified organism: Secondary | ICD-10-CM

## 2016-04-01 DIAGNOSIS — F329 Major depressive disorder, single episode, unspecified: Secondary | ICD-10-CM | POA: Diagnosis present

## 2016-04-01 DIAGNOSIS — R0989 Other specified symptoms and signs involving the circulatory and respiratory systems: Secondary | ICD-10-CM

## 2016-04-01 DIAGNOSIS — E876 Hypokalemia: Secondary | ICD-10-CM | POA: Diagnosis present

## 2016-04-01 DIAGNOSIS — G4733 Obstructive sleep apnea (adult) (pediatric): Secondary | ICD-10-CM | POA: Diagnosis present

## 2016-04-01 DIAGNOSIS — E1122 Type 2 diabetes mellitus with diabetic chronic kidney disease: Secondary | ICD-10-CM | POA: Diagnosis present

## 2016-04-01 DIAGNOSIS — E7800 Pure hypercholesterolemia, unspecified: Secondary | ICD-10-CM | POA: Diagnosis present

## 2016-04-01 HISTORY — DX: Personal history of other medical treatment: Z92.89

## 2016-04-01 LAB — RETICULOCYTES
RBC.: 2.89 MIL/uL — ABNORMAL LOW (ref 3.87–5.11)
RETIC CT PCT: 2 % (ref 0.4–3.1)
Retic Count, Absolute: 57.8 10*3/uL (ref 19.0–186.0)

## 2016-04-01 LAB — CBC
HCT: 24.5 % — ABNORMAL LOW (ref 36.0–46.0)
Hemoglobin: 7.5 g/dL — ABNORMAL LOW (ref 12.0–15.0)
MCH: 26 pg (ref 26.0–34.0)
MCHC: 30.6 g/dL (ref 30.0–36.0)
MCV: 85.1 fL (ref 78.0–100.0)
PLATELETS: 288 10*3/uL (ref 150–400)
RBC: 2.88 MIL/uL — AB (ref 3.87–5.11)
RDW: 15.9 % — ABNORMAL HIGH (ref 11.5–15.5)
WBC: 24.4 10*3/uL — AB (ref 4.0–10.5)

## 2016-04-01 LAB — URINALYSIS, ROUTINE W REFLEX MICROSCOPIC
Bilirubin Urine: NEGATIVE
Glucose, UA: NEGATIVE mg/dL
HGB URINE DIPSTICK: NEGATIVE
Ketones, ur: NEGATIVE mg/dL
LEUKOCYTES UA: NEGATIVE
NITRITE: NEGATIVE
PH: 5 (ref 5.0–8.0)
Protein, ur: 30 mg/dL — AB
SPECIFIC GRAVITY, URINE: 1.015 (ref 1.005–1.030)
SQUAMOUS EPITHELIAL / LPF: NONE SEEN

## 2016-04-01 LAB — I-STAT CG4 LACTIC ACID, ED
LACTIC ACID, VENOUS: 0.92 mmol/L (ref 0.5–1.9)
Lactic Acid, Venous: 0.81 mmol/L (ref 0.5–1.9)

## 2016-04-01 LAB — I-STAT TROPONIN, ED: TROPONIN I, POC: 0.12 ng/mL — AB (ref 0.00–0.08)

## 2016-04-01 LAB — BASIC METABOLIC PANEL
ANION GAP: 9 (ref 5–15)
BUN: 57 mg/dL — ABNORMAL HIGH (ref 6–20)
CALCIUM: 8.3 mg/dL — AB (ref 8.9–10.3)
CO2: 15 mmol/L — ABNORMAL LOW (ref 22–32)
CREATININE: 1.86 mg/dL — AB (ref 0.44–1.00)
Chloride: 111 mmol/L (ref 101–111)
GFR, EST AFRICAN AMERICAN: 29 mL/min — AB (ref >60)
GFR, EST NON AFRICAN AMERICAN: 25 mL/min — AB (ref >60)
Glucose, Bld: 104 mg/dL — ABNORMAL HIGH (ref 65–99)
Potassium: 5.8 mmol/L — ABNORMAL HIGH (ref 3.5–5.1)
SODIUM: 135 mmol/L (ref 135–145)

## 2016-04-01 LAB — IRON AND TIBC
Iron: 12 ug/dL — ABNORMAL LOW (ref 28–170)
Saturation Ratios: 7 % — ABNORMAL LOW (ref 10.4–31.8)
TIBC: 182 ug/dL — ABNORMAL LOW (ref 250–450)
UIBC: 170 ug/dL

## 2016-04-01 LAB — GLUCOSE, CAPILLARY: Glucose-Capillary: 158 mg/dL — ABNORMAL HIGH (ref 65–99)

## 2016-04-01 LAB — VITAMIN B12: Vitamin B-12: 855 pg/mL (ref 180–914)

## 2016-04-01 LAB — TROPONIN I
TROPONIN I: 0.06 ng/mL — AB (ref <0.03)
Troponin I: 0.06 ng/mL (ref <0.03)

## 2016-04-01 LAB — D-DIMER, QUANTITATIVE: D-Dimer, Quant: 2.93 ug/mL-FEU — ABNORMAL HIGH (ref 0.00–0.50)

## 2016-04-01 LAB — FERRITIN: FERRITIN: 328 ng/mL — AB (ref 11–307)

## 2016-04-01 LAB — FOLATE: Folate: 11.7 ng/mL (ref >5.9)

## 2016-04-01 LAB — BRAIN NATRIURETIC PEPTIDE: B NATRIURETIC PEPTIDE 5: 460.9 pg/mL — AB (ref 0.0–100.0)

## 2016-04-01 MED ORDER — ENOXAPARIN SODIUM 30 MG/0.3ML ~~LOC~~ SOLN
30.0000 mg | SUBCUTANEOUS | Status: DC
  Administered 2016-04-02: 30 mg via SUBCUTANEOUS
  Filled 2016-04-01: qty 0.3

## 2016-04-01 MED ORDER — ASPIRIN EC 81 MG PO TBEC
81.0000 mg | DELAYED_RELEASE_TABLET | Freq: Every day | ORAL | Status: DC
  Administered 2016-04-02 – 2016-04-03 (×2): 81 mg via ORAL
  Filled 2016-04-01 (×2): qty 1

## 2016-04-01 MED ORDER — SODIUM CHLORIDE 0.9% FLUSH: 3.0000 mL | INTRAVENOUS | Status: DC | PRN

## 2016-04-01 MED ORDER — ATORVASTATIN CALCIUM 80 MG PO TABS
80.0000 mg | ORAL_TABLET | Freq: Every day | ORAL | Status: DC
  Administered 2016-04-02 – 2016-04-03 (×2): 80 mg via ORAL
  Filled 2016-04-01 (×2): qty 1

## 2016-04-01 MED ORDER — BISACODYL 10 MG RE SUPP: 10.0000 mg | RECTAL | Status: DC | PRN

## 2016-04-01 MED ORDER — LEVOFLOXACIN IN D5W 500 MG/100ML IV SOLN: 500.0000 mg | INTRAVENOUS | Status: DC

## 2016-04-01 MED ORDER — ALBUTEROL SULFATE (2.5 MG/3ML) 0.083% IN NEBU: 2.5000 mg | INHALATION_SOLUTION | RESPIRATORY_TRACT | Status: DC | PRN

## 2016-04-01 MED ORDER — INSULIN ASPART 100 UNIT/ML ~~LOC~~ SOLN
0.0000 [IU] | Freq: Three times a day (TID) | SUBCUTANEOUS | Status: DC
Start: 2016-04-02 — End: 2016-04-02
  Administered 2016-04-02: 8 [IU] via SUBCUTANEOUS
  Administered 2016-04-02: 3 [IU] via SUBCUTANEOUS

## 2016-04-01 MED ORDER — VANCOMYCIN HCL IN DEXTROSE 1-5 GM/200ML-% IV SOLN: 1000.0000 mg | INTRAVENOUS | Status: DC

## 2016-04-01 MED ORDER — CARVEDILOL 25 MG PO TABS
25.0000 mg | ORAL_TABLET | Freq: Two times a day (BID) | ORAL | Status: DC
  Administered 2016-04-02: 25 mg via ORAL
  Filled 2016-04-01: qty 1

## 2016-04-01 MED ORDER — ALBUTEROL SULFATE HFA 108 (90 BASE) MCG/ACT IN AERS: 2.0000 | INHALATION_SPRAY | RESPIRATORY_TRACT | Status: DC | PRN

## 2016-04-01 MED ORDER — PANTOPRAZOLE SODIUM 40 MG PO TBEC
40.0000 mg | DELAYED_RELEASE_TABLET | Freq: Every day | ORAL | Status: DC
  Administered 2016-04-02 – 2016-04-03 (×2): 40 mg via ORAL
  Filled 2016-04-01 (×2): qty 1

## 2016-04-01 MED ORDER — DEXTROSE 5 % IV SOLN
2.0000 g | Freq: Once | INTRAVENOUS | Status: AC
  Administered 2016-04-01: 2 g via INTRAVENOUS
  Filled 2016-04-01: qty 2

## 2016-04-01 MED ORDER — DEXTROSE 5 % IV SOLN: 1.0000 g | INTRAVENOUS | Status: DC

## 2016-04-01 MED ORDER — SODIUM CHLORIDE 0.9% FLUSH
3.0000 mL | Freq: Two times a day (BID) | INTRAVENOUS | Status: DC
  Administered 2016-04-02 – 2016-04-03 (×4): 3 mL via INTRAVENOUS

## 2016-04-01 MED ORDER — AMLODIPINE BESYLATE 10 MG PO TABS
10.0000 mg | ORAL_TABLET | Freq: Every day | ORAL | Status: DC
  Administered 2016-04-03: 10 mg via ORAL
  Filled 2016-04-01 (×2): qty 1

## 2016-04-01 MED ORDER — VANCOMYCIN HCL 10 G IV SOLR
1500.0000 mg | Freq: Once | INTRAVENOUS | Status: DC
  Administered 2016-04-01: 1500 mg via INTRAVENOUS
  Filled 2016-04-01: qty 1500

## 2016-04-01 MED ORDER — PREDNISONE 50 MG PO TABS
50.0000 mg | ORAL_TABLET | Freq: Every day | ORAL | Status: DC
  Administered 2016-04-02 – 2016-04-03 (×2): 50 mg via ORAL
  Filled 2016-04-01 (×2): qty 1

## 2016-04-01 MED ORDER — TIOTROPIUM BROMIDE MONOHYDRATE 18 MCG IN CAPS
18.0000 ug | ORAL_CAPSULE | Freq: Every day | RESPIRATORY_TRACT | Status: DC
  Administered 2016-04-02 – 2016-04-03 (×2): 18 ug via RESPIRATORY_TRACT
  Filled 2016-04-01: qty 5

## 2016-04-01 MED ORDER — SODIUM CHLORIDE 0.9 % IV SOLN: 250.0000 mL | INTRAVENOUS | Status: DC | PRN

## 2016-04-01 MED ORDER — INSULIN GLARGINE 100 UNIT/ML ~~LOC~~ SOLN
10.0000 [IU] | Freq: Every day | SUBCUTANEOUS | Status: DC
  Administered 2016-04-02 (×2): 10 [IU] via SUBCUTANEOUS
  Filled 2016-04-01 (×3): qty 0.1

## 2016-04-01 MED ORDER — IPRATROPIUM-ALBUTEROL 0.5-2.5 (3) MG/3ML IN SOLN
3.0000 mL | RESPIRATORY_TRACT | Status: DC | PRN
  Administered 2016-04-02 (×3): 3 mL via RESPIRATORY_TRACT
  Filled 2016-04-01 (×3): qty 3

## 2016-04-01 MED ORDER — SODIUM CHLORIDE 0.9 % IV BOLUS (SEPSIS)
1000.0000 mL | Freq: Once | INTRAVENOUS | Status: AC
  Administered 2016-04-01: 1000 mL via INTRAVENOUS

## 2016-04-01 MED ORDER — ESCITALOPRAM OXALATE 20 MG PO TABS
20.0000 mg | ORAL_TABLET | Freq: Every day | ORAL | Status: DC
  Administered 2016-04-02 – 2016-04-03 (×2): 20 mg via ORAL
  Filled 2016-04-01 (×2): qty 1

## 2016-04-01 NOTE — Progress Notes (Signed)
   CC: productive cough and lung pain in setting of 4 weeks of viral illness  HPI: Natalie Porter is a 76 y.o. female who presents to [facility] today with cough and pain with deep breathing of 2-3 days duration. Patient was in usual state of health until 4 weeks ago when she developed flu-like symptoms with diarrhea and vomiting and fevers. Was seen at urgent care January 2nd and diagnosed with flu via nasal swab, given 5 days of tamiflu. Diarrhea and vomiting now resolved, however patient continues to have productive cough, congestion and headache.  She has required 4 breathing treatments daily, and now endorses pain with deep inspiration in her "right lung".  No chest pain, or dizziness. Endorses staying hydrated with gatorade and water. Mentating clearly, patient's daughter at bedside denies she's had confusion.    Review of Symptoms:  See HPI for ROS.   CC, SH/smoking status, and VS noted.  Objective: BP (!) 140/58   Pulse (!) 57   Temp 98.9 F (37.2 C) (Oral)   Wt 80.3 kg (177 lb)   BMI 32.37 kg/m  GEN: Patient is pale and ill-appearing but nontoxic, sits in wheelchair with multiple blankets, appears worse than baseline EYE: no conjunctival injection, pupils equally round and reactive to light ENMT: +right cochlear implant, +left ear obstructed with wax, +nasal erythema, no nasal polyps,no rhinorrhea, +mild pharyngeal erythema, no exudates NECK: full ROM, no thyromegally RESPIRATORY: +cough, +crackles appreciated through-out all lung fields, +end-expiratory wheezes appreciated with coughing CV: RRR, no m/r/g, no peripheral edema GI: soft, non-tender, non-distended, normoactive bowel sounds, no hepatosplenomegaly SKIN: warm and dry, +erythmea in skin folds with powder heavily caked on - states she gets yeast infections there PSYCH: AAOx3, appropriate affect, mentating clearly  Flu Vaccine: yes (12/22) Tdap Vaccine: due Pneumonia Vaccine: yes (12/22)  Assessment and  plan:  COPD exacerbation (HCC) Suspect COPD exacerbation, also suspect pneumonia given productive cough, right lung pain on deep inspiration, crackles appreciated throughout and end-expiratory wheezes appreciated when she coughs.  - Duoneb treatment in the office - tylenol x1 given in the office - Recommend patient goes straight to ED for evaluation and consideration for inpatient treatment. Does not require direct admission or an ambulance; one of our CMA's will wheel her across to the ED - Consider chest XR, steroid burst, and azithromycin   Everrett Coombe, MD,MS,  PGY1 04/01/2016 11:31 AM

## 2016-04-01 NOTE — H&P (Signed)
[facility] Admission History and Physical Service Pager: [phone]  Patient name: Natalie Porter Medical record number: [id number] Date of birth: May 12, 1940 Age: 76 y.o. Gender: female  Primary Care Provider: Everrett Coombe, MD Consultants: none Code Status: FULL  Chief Complaint: cough  Assessment and Plan: Natalie Porter is a 76 y.o. female presenting with cough and shortness of breath. PMH is significant for HFpEF, T2DM, stage 3 CKD, COPD, GERD, PVD, CAD, HLD, OSA, anemia, lung CA, osteoarthritis.   Cough and Shortness of breath concern for atypical PNA/CAP given leukocytosis WBC 24.4 on admit and CXR showing increased interstitial markings. Also likely with a COPD exacerbation given productive cough in addition to dyspnea and wheezes on lung exam. Differential also includes CHF exacerbation since BNP on admit 460.9 but unlikely given no orthopnea and appears euvolemic. Troponin 0.06 some concern for PE given c/o pleuritic chest pain but no tachycardia or tachypnea. Wells score 2.5 due to hx of cancer and hx of DVT.  With elevated wells criteria would consider further rule out of PE, however as patient is hemodynamically stable and with a more likely diagnosis, therefore would hold off on empirically treating for PE with heparin drip. Unlikely ACS since atypical chest pain and no new EKG changes from previous, however will trend troponin's given hx of . No concern for sepsis given does not meet SIRS or qSOFA and lactic acid is WNL.  - Admit to med/surg, under Dr. Gwendlyn Deutscher - discontinue vanc/cefepime that was started in the ED, deescalate to Levaquin IV  tomorrow.  - follow up on blood cultures  - monitor WBC and fever curve - Duonebs q4hrs prn  - monitor on telemetry - Trend troponin x 3  - AM EKG  - Obtain D-Dimer, if elevated consider V/Q scan  - prednisone '50mg'$  qd for 5 days - continue home spiriva for COPD  Diastolic CHF, stable. Does not in  exacerbation, euvolemic on exam. Bedside limited U/S in ED showed mildly reduced EF without pericardial effusion and depressed CVP. Last echo May 2015 EF 60-65% aortic valve thickened and calcified c/w sclerosis and mild regurg, L atrium mild dilated. BNP 460 today, when patient was seen in the July for CHF exacerbation was noted to be 570. At home on coreg '25mg'$  bid, valsartan '80mg'$  qd, lasix '80mg'$  qd. - monitor for signs of fluid overload - consider repeat ECHO  - Hold home lasix '80mg'$  qd since euvolemic and presence of AKI   Mild AKI in the setting of CKD Stage 3 Cr on admit 1.86, baseline ~1.4 likely due to dehydration since patient has had poor po intake with recent flu. - Received light fluid resuscitation in the ED. Encouraged good po hydration, if IV hydration is necessary will proceed with caution given has HFpEF. - hold home valsartan - monitor BMP  Adenocarcinoma, Lung: Left lung nodule stable, biopsied to be adenocarcinoma  - Continue to follow.   CAD - Cath 2015, 50% calcified LAD, 60% circumflex stenosis, right coronary artery 70% stenosis medical management - Could consider adding high dose statin  - continue 81 mg of aspirin   COPD exacerbation  -  Home Spiriva  -  Prednisone 50 mg and Levaquin   HTN BP 123/70 on admit. At home on valsartan '80mg'$  qd, norvasc '10mg'$  qd, coreg '25mg'$  bid. - continue home meds except holding valsartan d/t AKI  T2DM on lantus 30U at home. Last a1c 7.8 (02/06/16) - lantus 10u daily - SSI - monitor CBGs  Anemia Hgb on admit 7.5 with MCV 85.1, baseline ~8.5 Last iron studies 2015 - monitor CBC - iron studies pending - obtain FOBT  PVD, stable. Has h/o fem-fem bypass graft in 2015 and is s/p iliac artery stent. - Continue home gabapentin  HLD - Continue home atorvastatin '80mg'$  qd   GERD - continue home ppi   OSA - CPAP at night  H/o depression. - Continue home lexapro  FEN/GI: heart healthy carb modified diet. protonix Prophylaxis:  lovenox renally dosed   Disposition: pending medical improvement  History of Present Illness:  Natalie Porter is a 76 y.o. female presenting with SOB and pain with deep breathing x 2-3 days   States had flu-like symptoms with n/v, diarrhea, fevers 4 weeks ago, seen in ED 03/24/16 found to be flu positive, started on tamiflu then. Patient states n/v/d resolved but productive cough with clear sputum and dyspnea that has continued and progressively worsened over the last 1 week. Has needed breathing treatments 4 times  A day at home which temporarily brings relief. Has chest pain only to touch and with cough/deep inspiration, otherwise denies chest pain, is not associated with exertion. States her symptoms are very different from previous admission for CHF exacerbation; denies orthopnea or palpations. Does have h/o blood clot in her legs about 5 years ago but denies leg pain or swelling. Endorses some feeling diaphoretic as she has not had anything to eat.   Review Of Systems: Per HPI with the following additions: None  Review of Systems  Constitutional: Negative for chills and fever.  HENT: Negative for congestion and sore throat.   Respiratory: Positive for cough, sputum production (thick in mornings but mostly clear), shortness of breath and wheezing.   Cardiovascular: Positive for chest pain (with cough and deep inspiration). Negative for palpitations, orthopnea and leg swelling.  Gastrointestinal: Positive for constipation. Negative for abdominal pain, diarrhea, nausea and vomiting.  Genitourinary: Negative for dysuria, frequency and urgency.  Neurological: Negative for dizziness.    Patient Active Problem List   Diagnosis Date Noted  . S/P insertion of iliac artery stent 02/27/2016  . History of arterial bypass of lower extremity 02/27/2016  . Acute upper respiratory infection 01/12/2016  . Hyperkalemia 10/27/2015  . Constipation 10/09/2015  . Primary cancer of left upper lobe of  lung ([place]) 10/09/2015  . UTI (urinary tract infection) 10/09/2015  . HCAP (healthcare-associated pneumonia) 09/27/2015  . Dyspnea 09/27/2015  . OSA on CPAP 07/08/2015  . CAD in native artery 07/08/2015  . Primary cancer of right lower lobe of lung ([place]) 03/07/2015  . COPD exacerbation ([place]) 12/11/2014  . Peripheral vascular disease, unspecified 11/23/2013  . Nonhealing nonsurgical wound 08/17/2013  . Incisional infection 08/11/2013  . CKD (chronic kidney disease) stage 3, GFR 30-59 ml/min 08/09/2013  . Cardiorenal syndrome 08/02/2013  . Diastolic CHF, acute on chronic (HCC) 07/30/2013  . Anemia 07/30/2013  . Hypertension 07/30/2013  . HLD (hyperlipidemia) 07/30/2013  . PAD (peripheral artery disease) ([place]) 07/21/2013  . Chronic kidney disease (CKD), stage III (moderate) 06/17/2013  . 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 Leg pain 06/15/2013  . Long term current use of anticoagulant 12/20/2012  . Elevated erythrocyte sedimentation rate 10/20/2012  . Carotid artery narrowing 07/27/2012  . Clinical depression 07/27/2012  . Diabetes mellitus, type 2 ([place]) 07/27/2012  . Difficulty hearing 07/27/2012  . Adult BMI 30+ 06/21/2012  . Adiposity 06/21/2012  . History of deep venous thrombosis 12/24/2011  . Vascular disorder of lower extremity  12/24/2011  . CAFL (chronic airflow limitation) ([place]) 09/15/2011  . Lung nodule, multiple 09/15/2011  . Biliary calculi 08/04/2011  . Allergic rhinitis 07/24/2011  . Obstructive apnea 07/24/2011  . Arthritis, degenerative 07/24/2011  . Ex-cigarette smoker 07/23/2011  . Personal history of nicotine dependence 07/23/2011  . 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 Anemia   . Arthritis   . CAD (coronary artery disease)    Cath 2015, 50% calcified LAD, 60% circumflex stenosis, right coronary artery 70% stenosis medical management  . CHF  (congestive heart failure) (HCC)    Preserved EF.    Marland Kitchen COPD (chronic obstructive pulmonary disease) ([place])   . Depression   . Diabetes mellitus without complication ([place])    INSULIN DEPENDENT  . DVT (deep venous thrombosis) ([place])    2011  . Gallstone   . GERD (gastroesophageal reflux disease)   . Hx: UTI (urinary tract infection)   . Hypercholesteremia   . Hypertension   . Iron deficiency anemia   . Lung cancer [facility])    New diagnosis (treated at [facility]).  Going to receive radiation  . Neuropathy ([place])   . Osteoarthritis   . Pneumonia 2014  . PVD (peripheral vascular disease) ([place])   . Radiation 03/26/15, 03/28/15, 04/02/15   right lower lobe nodule SBRT 54 gray  . Renal disorder    stage 3  . SBO (small bowel obstruction) 08/2015  . Sleep apnea    CPAP  . TIA (transient ischemic attack)    PER PATIENT  . Vitamin B12 deficiency     Past Surgical History: Past Surgical History:  Procedure Laterality Date  . ABDOMINAL AORTAGRAM N/A 07/25/2013   Procedure: ABDOMINAL Maxcine Ham;  Surgeon: Serafina Mitchell, MD;  Location: [facility] CATH LAB;  Service: Cardiovascular;  Laterality: N/A;  . APPLICATION OF WOUND VAC Bilateral 08/07/2013   Procedure: APPLICATION OF INCISIONAL WOUND VAC;  Surgeon: Elam Dutch, MD;  Location: [facility];  Service: Vascular;  Laterality: Bilateral;  . BREAST BIOPSY Left   . CAROTID ENDARTERECTOMY Right 1990  . CARPAL TUNNEL RELEASE Bilateral   . COLONOSCOPY W/ POLYPECTOMY    . ENDARTERECTOMY FEMORAL Left 08/07/2013   Procedure: ENDARTERECTOMY FEMORAL;  Surgeon: Elam Dutch, MD;  Location: [facility];  Service: Vascular;  Laterality: Left;  . EYE SURGERY Bilateral    cataract  . FEMORAL-FEMORAL BYPASS GRAFT  08/07/2013   Procedure: BYPASS GRAFT FEMORAL-FEMORAL ARTERY USING 8MM X 30CM HEMASHIELD GRAFT; LEFT ILIAC ANGIOGRAM; ATTEMPTED LEFT ILIAC ARTERY STENT GRAFT;  Surgeon: Elam Dutch, MD;  Location: [facility];  Service: Vascular;;  . FOOT SURGERY Bilateral    bone  spurs, and repair  . LEFT HEART CATHETERIZATION WITH CORONARY ANGIOGRAM N/A 07/27/2013   Procedure: LEFT HEART CATHETERIZATION WITH CORONARY ANGIOGRAM;  Surgeon: Minus Breeding, MD;  Location: [facility] CATH LAB;  Service: Cardiovascular;  Laterality: N/A;  . LUNG BIOPSY     non cancerous  . TUBAL LIGATION      Social History: Social History  Substance Use Topics  . Smoking status: Former Smoker    Packs/day: 1.00    Years: 34.00    Types: Cigarettes    Quit date: 03/28/1988  . Smokeless tobacco: Never Used  . Alcohol use No   Please also refer to relevant sections of EMR.  Family History: Family History  Problem Relation Age of Onset  . Heart attack Mother 22  . Stroke Father   .  Heart attack Father 71  . Cancer Sister 71    Uterian  . Cancer Sister     Lung    Allergies and Medications: Allergies  Allergen Reactions  . Codeine Nausea Only  . Morphine And Related Nausea And Vomiting   No current facility-administered medications on file prior to encounter.    Current Outpatient Prescriptions on File Prior to Encounter  Medication Sig Dispense Refill  . acetaminophen-codeine (TYLENOL #2) 300-15 MG tablet Take 1 tablet by mouth every 4 (four) hours as needed for moderate pain. (Patient not taking: Reported on 02/27/2016) 30 tablet 0  . albuterol (PROVENTIL HFA;VENTOLIN HFA) 108 (90 Base) MCG/ACT inhaler Inhale 2 puffs into the lungs every 4 (four) hours as needed for wheezing or shortness of breath. 1 Inhaler 1  . amLODipine (NORVASC) 10 MG tablet Take 1 tablet (10 mg total) by mouth daily. 90 tablet 3  . aspirin EC 81 MG tablet Take 81 mg by mouth daily.    Marland Kitchen atorvastatin (LIPITOR) 80 MG tablet Take 1 tablet (80 mg total) by mouth daily. 90 tablet 1  . bisacodyl (DULCOLAX) 10 MG suppository Place 1 suppository (10 mg total) rectally as needed for moderate constipation. 6 suppository 0  . Calcium Carbonate (CALCIUM-CARB 600 PO) Take 600 mg by mouth 2 (two) times daily.    .  carvedilol (COREG) 25 MG tablet Take 1 tablet (25 mg total) by mouth 2 (two) times daily with a meal. 60 tablet 3  . escitalopram (LEXAPRO) 20 MG tablet Take 1 tablet (20 mg total) by mouth daily. 30 tablet 0  . fluticasone (FLONASE) 50 MCG/ACT nasal spray Place 2 sprays into both nostrils daily. 16 g 3  . furosemide (LASIX) 80 MG tablet Take 1 tablet (80 mg total) by mouth daily. 90 tablet 3  . gabapentin (NEURONTIN) 100 MG capsule Take 2-3 capsules (200-300 mg total) by mouth 2 (two) times daily. 200 mg in the morning and 300 mg at bedtime 120 capsule 3  . glucose blood test strip Use as instructed 100 each 12  . insulin glargine (LANTUS) 100 UNIT/ML injection Inject 0.3 mLs (30 Units total) into the skin at bedtime. 10 mL 2  . ipratropium-albuterol (DUONEB) 0.5-2.5 (3) MG/3ML SOLN Take 3 mLs by nebulization every 4 (four) hours as needed. 360 mL 0  . omeprazole (PRILOSEC) 20 MG capsule Take 1 capsule (20 mg total) by mouth daily. (Patient not taking: Reported on 02/27/2016) 30 capsule 2  . polyethylene glycol powder (GLYCOLAX/MIRALAX) powder Take 17 g by mouth 3 (three) times daily as needed for moderate constipation. 500 g 0  . tiotropium (SPIRIVA) 18 MCG inhalation capsule Place 1 capsule (18 mcg total) into inhaler and inhale daily. 30 capsule 3  . valsartan (DIOVAN) 80 MG tablet Take 1 tablet (80 mg total) by mouth daily. 30 tablet 11    Objective: BP 123/70   Pulse (!) 52   Temp 98.2 F (36.8 C) (Oral)   Resp 17   SpO2 96%  Exam: General: Lying comfortably in bed, in no distress Eyes: PERRL, EOMI, conjunctiva normal ENTM: nose normal, no nasal discharge. MMM  Neck: supple, normal ROM Cardiovascular: regular rate and rhythm, normal s1 and s2, grade 3/6 systolic murmur. No rubs or gallops, tenderness on palpation of chest  Respiratory: wheezes auscultated b/l, no rhonchi. Normal effort on room air  Gastrointestinal: soft, nontender, nondistended, + bs MSK: normal ROM Derm: warm and  dry Neuro: alert and oriented, no focal deficits Psych: appropriate  affect  Labs and Imaging: CBC BMET   Recent Labs Lab 04/01/16 1247  WBC 24.4*  HGB 7.5*  HCT 24.5*  PLT 288    Recent Labs Lab 04/01/16 1247  NA 135  K 5.8*  CL 111  CO2 15*  BUN 57*  CREATININE 1.86*  GLUCOSE 104*  CALCIUM 8.3*     Dg Chest 2 View  Result Date: 04/01/2016 CLINICAL DATA:  Clinical findings worrisome for pneumonia including productive cough, inspiratory pain on the right, and wheezing. History of [place] OPD. There is a history of lung malignancy status post radiation therapy. There is a known left apical mass. EXAM: CHEST  2 VIEW COMPARISON:  CT scan of the chest of February 26, 2016 FINDINGS: The lungs are adequately inflated. These soft tissue density mass in the left apex measures 4 cm transversely x 3.6 cm longitudinally x 4.2 cm AP. The interstitial markings of both lungs are increased as compared to the previous CT scan. There there is increased density in the left lower lung consistent with the abnormalities noted on the previous CT scan. There is a trace of pleural fluid. The cardiac silhouette is enlarged. The central pulmonary vascularity is prominent. There is calcification in the wall of the aortic arch. IMPRESSION: There is no discrete pneumonia. Abnormal masses in the left lung persist but are not greatly changed. The interstitial markings are both lungs are increased which may reflect acute bronchitic change or mild interstitial edema given the mild cardiomegaly and central pulmonary vascular prominence. Thoracic aortic atherosclerosis. Electronically Signed   By: David  Martinique M.D.   On: 04/01/2016 15:34   Troponin 0.06   EKG with right bundle branch block, unchanged from previous   Bufford Lope, DO 04/01/2016, 4:20 PM PGY-1, [place] [facility] pager: [phone], text pages welcome  UPPER LEVEL ADDENDUM  I have read the above note and made revisions  highlighted in blue.  Kerrin Mo, MD, PGY-2 [facility]

## 2016-04-01 NOTE — Progress Notes (Signed)
Pharmacy Antibiotic Note  Weslee Prestage is a 76 y.o. female admitted on 04/01/2016 with SOB. Starting empiric antibiotics for a possible pneumonia. WBC 24, LA wnl, afebrile, SCr 1.8, eCrCl 25-30 ml/min.   Plan: -Vancomycin 1500 mg IV x1 then 1g/24h -Cefepime 2 g IV x1 then 1g/24h -Monitor renal fx, cultures, VT as needed    Temp (24hrs), Avg:98.8 F (37.1 C), Min:98.2 F (36.8 C), Max:99.4 F (37.4 C)   Recent Labs Lab 04/01/16 1247  WBC 24.4*  CREATININE 1.86*     Allergies  Allergen Reactions  . Codeine Nausea Only  . Morphine And Related Nausea And Vomiting    Antimicrobials this admission: 1/10 vancomycin > 1/10 cefepime >  Dose adjustments this admission: N/A  Microbiology results: 1/10 blood cx:   Thank you for allowing pharmacy to be a part of this patient's care.   Harvel Quale 04/01/2016 3:23 PM

## 2016-04-01 NOTE — ED Notes (Signed)
Attempted report x1. 

## 2016-04-01 NOTE — Patient Instructions (Signed)
It was a pleasure seeing you today in our clinic.  It is my recommendation hat you go to the emergency department to be evaluated for COPD flare and possible pneumonia. It is possible that you may need to be admitted into the hospital.  We will send you straight to the ED from clinic by walking you across the street.  Our clinic's number is [phone]. Please call with questions or concerns about what we discussed today.  Be well, Dr. Burr Medico

## 2016-04-01 NOTE — ED Triage Notes (Signed)
Pt sent here from doctor for questionable pneumonia. Pt recently treated for Flu. Pt feeling sob

## 2016-04-01 NOTE — ED Provider Notes (Signed)
[facility] DEPT Provider Note   CSN: [id number] Arrival date & time: 04/01/16  1143     History   Chief Complaint Chief Complaint  Patient presents with  . Shortness of Breath    HPI Natalie Porter is a 76 y.o. female.  HPI  76 y.o. female with a hx of CAD, CHF, COPD, DM, HTN, presents to the Emergency Department today from PCP due to shortness of breath x 2-3 days. Notes being diagnosed with Influenza x 1-2 weeks ago and has gotten progressively worse even with Tamiflu therapy. Notes occasional N/V/D since then, but appears to be improving recently. No abdominal pain. No dysuria. Pt daughter states that she has been using up to four breathing treatments a day due to feeling short of breath. Notes mild chest discomfort that is worsened with coughing. No other symptoms noted.     Past Medical History:  Diagnosis Date  . Anemia   . Arthritis   . CAD (coronary artery disease)    Cath 2015, 50% calcified LAD, 60% circumflex stenosis, right coronary artery 70% stenosis medical management  . CHF (congestive heart failure) (HCC)    Preserved EF.    Marland Kitchen COPD (chronic obstructive pulmonary disease) ([place])   . Depression   . Diabetes mellitus without complication ([place])    INSULIN DEPENDENT  . DVT (deep venous thrombosis) ([place])    2011  . Gallstone   . GERD (gastroesophageal reflux disease)   . Hx: UTI (urinary tract infection)   . Hypercholesteremia   . Hypertension   . Iron deficiency anemia   . Lung cancer [facility])    New diagnosis (treated at [facility]).  Going to receive radiation  . Neuropathy ([place])   . Osteoarthritis   . Pneumonia 2014  . PVD (peripheral vascular disease) ([place])   . Radiation 03/26/15, 03/28/15, 04/02/15   right lower lobe nodule SBRT 54 gray  . Renal disorder    stage 3  . SBO (small bowel obstruction) 08/2015  . Sleep apnea    CPAP  . TIA (transient ischemic attack)    PER PATIENT  . Vitamin B12 deficiency     Patient Active Problem List   Diagnosis Date Noted  . S/P insertion of iliac artery stent 02/27/2016  . History of arterial bypass of lower extremity 02/27/2016  . Acute upper respiratory infection 01/12/2016  . Hyperkalemia 10/27/2015  . Constipation 10/09/2015  . Primary cancer of left upper lobe of lung ([place]) 10/09/2015  . UTI (urinary tract infection) 10/09/2015  . HCAP (healthcare-associated pneumonia) 09/27/2015  . Dyspnea 09/27/2015  . OSA on CPAP 07/08/2015  . CAD in native artery 07/08/2015  . Primary cancer of right lower lobe of lung ([place]) 03/07/2015  . COPD exacerbation ([place]) 12/11/2014  . Peripheral vascular disease, unspecified 11/23/2013  . Nonhealing nonsurgical wound 08/17/2013  . Incisional infection 08/11/2013  . CKD (chronic kidney disease) stage 3, GFR 30-59 ml/min 08/09/2013  . Cardiorenal syndrome 08/02/2013  . Diastolic CHF, acute on chronic (HCC) 07/30/2013  . Anemia 07/30/2013  . Hypertension 07/30/2013  . HLD (hyperlipidemia) 07/30/2013  . PAD (peripheral artery disease) ([place]) 07/21/2013  . Chronic kidney disease (CKD), stage III (moderate) 06/17/2013  . 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 Leg pain 06/15/2013  . Long term current use of anticoagulant 12/20/2012  . Elevated erythrocyte sedimentation rate 10/20/2012  . Carotid artery narrowing 07/27/2012  . Clinical depression 07/27/2012  . Diabetes mellitus, type 2 ([place]) 07/27/2012  . Difficulty hearing 07/27/2012  .  Adult BMI 30+ 06/21/2012  . Adiposity 06/21/2012  . History of deep venous thrombosis 12/24/2011  . Vascular disorder of lower extremity 12/24/2011  . CAFL (chronic airflow limitation) ([place]) 09/15/2011  . Lung nodule, multiple 09/15/2011  . Biliary calculi 08/04/2011  . Allergic rhinitis 07/24/2011  . Obstructive apnea 07/24/2011  . Arthritis, degenerative 07/24/2011  . Ex-cigarette smoker 07/23/2011  . Personal history of nicotine dependence 07/23/2011  . 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 ABDOMINAL AORTAGRAM N/A 07/25/2013   Procedure: ABDOMINAL Maxcine Ham;  Surgeon: Serafina Mitchell, MD;  Location: [facility] CATH LAB;  Service: Cardiovascular;  Laterality: N/A;  . APPLICATION OF WOUND VAC Bilateral 08/07/2013   Procedure: APPLICATION OF INCISIONAL WOUND VAC;  Surgeon: Elam Dutch, MD;  Location: [facility];  Service: Vascular;  Laterality: Bilateral;  . BREAST BIOPSY Left   . CAROTID ENDARTERECTOMY Right 1990  . CARPAL TUNNEL RELEASE Bilateral   . COLONOSCOPY W/ POLYPECTOMY    . ENDARTERECTOMY FEMORAL Left 08/07/2013   Procedure: ENDARTERECTOMY FEMORAL;  Surgeon: Elam Dutch, MD;  Location: [facility];  Service: Vascular;  Laterality: Left;  . EYE SURGERY Bilateral    cataract  . FEMORAL-FEMORAL BYPASS GRAFT  08/07/2013   Procedure: BYPASS GRAFT FEMORAL-FEMORAL ARTERY USING 8MM X 30CM HEMASHIELD GRAFT; LEFT ILIAC ANGIOGRAM; ATTEMPTED LEFT ILIAC ARTERY STENT GRAFT;  Surgeon: Elam Dutch, MD;  Location: [facility];  Service: Vascular;;  . FOOT SURGERY Bilateral    bone spurs, and repair  . LEFT HEART CATHETERIZATION WITH CORONARY ANGIOGRAM N/A 07/27/2013   Procedure: LEFT HEART CATHETERIZATION WITH CORONARY ANGIOGRAM;  Surgeon: Minus Breeding, MD;  Location: [facility] CATH LAB;  Service: Cardiovascular;  Laterality: N/A;  . LUNG BIOPSY     non cancerous  . TUBAL LIGATION      OB History    No data available       Home Medications    Prior to Admission medications   Medication Sig Start Date End Date Taking? Authorizing Provider  acetaminophen-codeine (TYLENOL #2) 300-15 MG tablet Take 1 tablet by mouth every 4 (four) hours as needed for moderate pain. Patient not taking: Reported on 02/27/2016 11/08/15   Burna Cash Rumley, DO  albuterol (PROVENTIL HFA;VENTOLIN HFA) 108 (90 Base) MCG/ACT inhaler Inhale 2 puffs into the lungs every 4 (four) hours as needed for wheezing or shortness of breath. 02/18/16    Everrett Coombe, MD  amLODipine (NORVASC) 10 MG tablet Take 1 tablet (10 mg total) by mouth daily. 02/18/16   Everrett Coombe, MD  aspirin EC 81 MG tablet Take 81 mg by mouth daily.    Historical Provider, MD  atorvastatin (LIPITOR) 80 MG tablet Take 1 tablet (80 mg total) by mouth daily. 02/18/16   Everrett Coombe, MD  bisacodyl (DULCOLAX) 10 MG suppository Place 1 suppository (10 mg total) rectally as needed for moderate constipation. 03/13/16   Archie Patten, MD  Calcium Carbonate (CALCIUM-CARB 600 PO) Take 600 mg by mouth 2 (two) times daily.    Historical Provider, MD  carvedilol (COREG) 25 MG tablet Take 1 tablet (25 mg total) by mouth 2 (two) times daily with a meal. 02/18/16   Everrett Coombe, MD  escitalopram (LEXAPRO) 20 MG tablet Take 1 tablet (20 mg total) by mouth daily. 02/18/16   Everrett Coombe, MD  fluticasone (FLONASE) 50 MCG/ACT nasal spray Place 2 sprays into both nostrils daily. 02/18/16   Everrett Coombe, MD  furosemide (LASIX)  80 MG tablet Take 1 tablet (80 mg total) by mouth daily. 02/18/16   Everrett Coombe, MD  gabapentin (NEURONTIN) 100 MG capsule Take 2-3 capsules (200-300 mg total) by mouth 2 (two) times daily. 200 mg in the morning and 300 mg at bedtime 02/18/16   Everrett Coombe, MD  glucose blood test strip Use as instructed 02/18/16   Everrett Coombe, MD  insulin glargine (LANTUS) 100 UNIT/ML injection Inject 0.3 mLs (30 Units total) into the skin at bedtime. 02/18/16   Everrett Coombe, MD  ipratropium-albuterol (DUONEB) 0.5-2.5 (3) MG/3ML SOLN Take 3 mLs by nebulization every 4 (four) hours as needed. 02/18/16   Everrett Coombe, MD  omeprazole (PRILOSEC) 20 MG capsule Take 1 capsule (20 mg total) by mouth daily. Patient not taking: Reported on 02/27/2016 02/18/16   Everrett Coombe, MD  polyethylene glycol powder (GLYCOLAX/MIRALAX) powder Take 17 g by mouth 3 (three) times daily as needed for moderate constipation. 03/13/16   Archie Patten, MD  tiotropium (SPIRIVA) 18 MCG inhalation capsule Place 1 capsule  (18 mcg total) into inhaler and inhale daily. 02/18/16   Everrett Coombe, MD  valsartan (DIOVAN) 80 MG tablet Take 1 tablet (80 mg total) by mouth daily. 02/18/16 02/17/17  Everrett Coombe, MD    Family History Family History  Problem Relation Age of Onset  . Heart attack Mother 31  . Stroke Father   . Heart attack Father 58  . Cancer Sister 42    Uterian  . Cancer Sister     Lung    Social History Social History  Substance Use Topics  . Smoking status: Former Smoker    Packs/day: 1.00    Years: 34.00    Types: Cigarettes    Quit date: 03/28/1988  . Smokeless tobacco: Never Used  . Alcohol use No     Allergies   Codeine and Morphine and related   Review of Systems Review of Systems ROS reviewed and all are negative for acute change except as noted in the HPI.  Physical Exam Updated Vital Signs BP (!) 140/48 (BP Location: Right Arm)   Pulse (!) 58   Temp 98.2 F (36.8 C) (Oral)   Resp 15   SpO2 100%   Physical Exam  Constitutional: She is oriented to person, place, and time. Vital signs are normal. She appears well-developed and well-nourished. No distress.  HENT:  Head: Normocephalic and atraumatic.  Right Ear: Hearing, tympanic membrane, external ear and ear canal normal.  Left Ear: Hearing, tympanic membrane, external ear and ear canal normal.  Nose: Nose normal.  Mouth/Throat: Uvula is midline, oropharynx is clear and moist and mucous membranes are normal. No trismus in the jaw. No oropharyngeal exudate, posterior oropharyngeal erythema or tonsillar abscesses.  Eyes: Conjunctivae and EOM are normal. Pupils are equal, round, and reactive to light.  Neck: Normal range of motion. Neck supple. No tracheal deviation present.  Cardiovascular: Normal rate, regular rhythm, S1 normal, S2 normal, normal heart sounds, intact distal pulses and normal pulses.   Pulmonary/Chest: Effort normal. No accessory muscle usage. No tachypnea. No respiratory distress. She has no decreased  breath sounds. She has no wheezes. She has rhonchi in the right lower field. She has no rales.  Abdominal: Normal appearance and bowel sounds are normal. There is no tenderness.  Musculoskeletal: Normal range of motion.  Neurological: She is alert and oriented to person, place, and time.  Skin: Skin is warm and dry.  Psychiatric: She has a normal mood and affect. Her  speech is normal and behavior is normal. Thought content normal.  Nursing note and vitals reviewed.  ED Treatments / Results  Labs (all labs ordered are listed, but only abnormal results are displayed) Labs Reviewed  BASIC METABOLIC PANEL - Abnormal; Notable for the following:       Result Value   Potassium 5.8 (*)    CO2 15 (*)    Glucose, Bld 104 (*)    BUN 57 (*)    Creatinine, Ser 1.86 (*)    Calcium 8.3 (*)    GFR calc non Af Amer 25 (*)    GFR calc Af Amer 29 (*)    All other components within normal limits  CBC - Abnormal; Notable for the following:    WBC 24.4 (*)    RBC 2.88 (*)    Hemoglobin 7.5 (*)    HCT 24.5 (*)    RDW 15.9 (*)    All other components within normal limits  I-STAT TROPOININ, ED - Abnormal; Notable for the following:    Troponin i, poc 0.12 (*)    All other components within normal limits  CULTURE, BLOOD (ROUTINE X 2)  CULTURE, BLOOD (ROUTINE X 2)  URINALYSIS, ROUTINE W REFLEX MICROSCOPIC  BRAIN NATRIURETIC PEPTIDE  I-STAT CG4 LACTIC ACID, ED    EKG  EKG Interpretation  Date/Time:  Wednesday April 01 2016 12:45:29 EST Ventricular Rate:  53 PR Interval:  148 QRS Duration: 120 QT Interval:  500 QTC Calculation: 469 R Axis:   81 Text Interpretation:  Sinus bradycardia Right bundle branch block Abnormal ECG No significant change since last tracing Confirmed by KNOTT MD, Quillian Quince ([id number]) on 04/01/2016 3:16:11 PM       Radiology Dg Chest 2 View  Result Date: 04/01/2016 CLINICAL DATA:  Clinical findings worrisome for pneumonia including productive cough, inspiratory pain on  the right, and wheezing. History of [place] OPD. There is a history of lung malignancy status post radiation therapy. There is a known left apical mass. EXAM: CHEST  2 VIEW COMPARISON:  CT scan of the chest of February 26, 2016 FINDINGS: The lungs are adequately inflated. These soft tissue density mass in the left apex measures 4 cm transversely x 3.6 cm longitudinally x 4.2 cm AP. The interstitial markings of both lungs are increased as compared to the previous CT scan. There there is increased density in the left lower lung consistent with the abnormalities noted on the previous CT scan. There is a trace of pleural fluid. The cardiac silhouette is enlarged. The central pulmonary vascularity is prominent. There is calcification in the wall of the aortic arch. IMPRESSION: There is no discrete pneumonia. Abnormal masses in the left lung persist but are not greatly changed. The interstitial markings are both lungs are increased which may reflect acute bronchitic change or mild interstitial edema given the mild cardiomegaly and central pulmonary vascular prominence. Thoracic aortic atherosclerosis. Electronically Signed   By: David  Martinique M.D.   On: 04/01/2016 15:34    Procedures Procedures (including critical care time)  Medications Ordered in ED Medications - No data to display   Initial Impression / Assessment and Plan / ED Course  I have reviewed the triage vital signs and the nursing notes.  Pertinent labs & imaging results that were available during my care of the patient were reviewed by me and considered in my medical decision making (see chart for details).  Clinical Course    Final Clinical Impressions(s) / ED Diagnoses  {I have reviewed  and evaluated the relevant laboratory values. {I have reviewed and evaluated the relevant imaging studies. {I have interpreted the relevant EKG. {I have reviewed the relevant previous healthcare records.  {I obtained HPI from historian. {Patient discussed  with supervising physician.  ED Course:  Assessment: Pt is a 45yF with hx CAD, CHF, COPD, DM, HTN who presents with shortness of breath x 3-4 days. Sent by PCP for eval of Pneumonia. recently treated for positive flu. On exam, pt in NAD. Nontoxic/nonseptic appearing. VSS. Afebrile. Lungs with rales RLL. Heart RRR. Abdomen nontender soft. CBC with leukocytosis 24.4. HGB 7.5 without recent comparison. No melena. No hematochezia. BMP with hyperkalemia at 5.8. CO2 15. CXR shows interstitial markings consistent with acute bronchitic change. Started on Vanc/Cefepime for suspect Atypical PNA. Blood cultures drawn. iStat Lactic 0.92. Trop 0.12. Doubt ACS. Given fluids and analgesia in ED. Concern for possible PE given right lower pleuritic chest pain, elevated troponin. Could reflect right heart strain. Elevated Creatinine and poor GFR does not allow for CT Angio. Although Cardiac BS US shows no right heart strain and evidence of intravascular volume depletion consistent with metabolic acidosis. UA pending. Plan is to Admit for further evaluation.   Disposition/Plan:  Admit Pt acknowledges and agrees with plan  Supervising Physician Leo Grosser, MD  Final diagnoses:  Elevated troponin  Atypical pneumonia    New Prescriptions New Prescriptions   No medications on file     Shary Decamp, PA-C 04/01/16 [place], MD 04/02/16 [phone]

## 2016-04-01 NOTE — Assessment & Plan Note (Addendum)
Suspect COPD exacerbation, also suspect pneumonia given productive cough, right lung pain on deep inspiration, crackles appreciated throughout and end-expiratory wheezes appreciated when she coughs.  - Duoneb treatment in the office - tylenol x1 given in the office - Recommend patient goes straight to ED for evaluation and consideration for inpatient treatment. Does not require direct admission or an ambulance; one of our CMA's will wheel her across to the ED - Consider chest XR, steroid burst, and azithromycin

## 2016-04-01 NOTE — ED Notes (Addendum)
Performed I&O cath with Martie Round RN

## 2016-04-01 NOTE — H&P (Addendum)
Patient seen and evaluated by me. I will discuss with resident and cosign their notes when completed. My full note to follow.  Transition of care: Signed off to Dr. Erin Hearing today 12/14/16 at 8am. This to follow-up on: V/Q Scan result and start anticoagulant if abnormal                                   Monitor O2 requirement.                                  Post transfusion CBC                                  Transition to oral A/B

## 2016-04-01 NOTE — ED Notes (Signed)
Patient taken to xray.

## 2016-04-01 NOTE — ED Provider Notes (Signed)
Medical screening examination/treatment/procedure(s) were conducted as a shared visit with non-physician practitioner(s) and myself.  I personally evaluated the patient during the encounter.   EKG Interpretation  Date/Time:  Wednesday April 01 2016 12:45:29 EST Ventricular Rate:  53 PR Interval:  148 QRS Duration: 120 QT Interval:  500 QTC Calculation: 469 R Axis:   81 Text Interpretation:  Sinus bradycardia Right bundle branch block Abnormal ECG No significant change since last tracing Confirmed by Spurgeon Gancarz MD, Quillian Quince ([id number]) on 04/01/2016 3:16:11 PM      76 y.o. female presents with shortness of breath, She was recently diagnosed with flu and treated with Tamiflu. Has elevated white count concerning for secondary infection. No convincing pneumonia on chest x-ray. She is wearing pull ups and has been having nausea vomiting and diarrhea concerning for possible urinary tract infection. Planned in and out catheterization. Elevated troponin raise the question of pulmonary embolism with recent immobility during her influenza illness, bedside ultrasound shows no right heart strain and evidence of intravascular volume depletion that is consistent with her metabolic acidosis and clinical presentation. Patient was given aspirin for atypical ACS but doubt this with no typical features. Admit for monitoring troponin in light fluid resuscitation. GFR is too poor to undergo contrasted CT, consider V/Q as inpatient. We'll cover for urinary tract infection if UA is concerning.  See related encounter note  Emergency Focused Ultrasound Exam Limited Ultrasound of the Heart and Pericardium  Performed and interpreted by Dr. Laneta Simmers Indication: shortness of breath, positive troponin Multiple views of the heart, pericardium, and IVC are obtained with a multi frequency probe.  Findings: mildly decreased contractility, no anechoic fluid, complete IVC collapse, no RV dilation or bowing of septum Interpretation: mildly  reduced ejection fraction, no pericardial effusion, depressed CVP, no right heart strain Images archived electronically.  CPT Code: 10932    Leo Grosser, MD 04/02/16 [phone]

## 2016-04-02 ENCOUNTER — Inpatient Hospital Stay (HOSPITAL_COMMUNITY): Payer: Medicare Other

## 2016-04-02 DIAGNOSIS — J418 Mixed simple and mucopurulent chronic bronchitis: Secondary | ICD-10-CM

## 2016-04-02 LAB — BASIC METABOLIC PANEL
ANION GAP: 8 (ref 5–15)
BUN: 59 mg/dL — ABNORMAL HIGH (ref 6–20)
CO2: 14 mmol/L — ABNORMAL LOW (ref 22–32)
Calcium: 7.6 mg/dL — ABNORMAL LOW (ref 8.9–10.3)
Chloride: 112 mmol/L — ABNORMAL HIGH (ref 101–111)
Creatinine, Ser: 1.93 mg/dL — ABNORMAL HIGH (ref 0.44–1.00)
GFR calc Af Amer: 28 mL/min — ABNORMAL LOW (ref >60)
GFR, EST NON AFRICAN AMERICAN: 24 mL/min — AB (ref >60)
Glucose, Bld: 215 mg/dL — ABNORMAL HIGH (ref 65–99)
POTASSIUM: 5.6 mmol/L — AB (ref 3.5–5.1)
SODIUM: 134 mmol/L — AB (ref 135–145)

## 2016-04-02 LAB — CBC
HEMATOCRIT: 22 % — AB (ref 36.0–46.0)
HEMOGLOBIN: 6.7 g/dL — AB (ref 12.0–15.0)
MCH: 25.9 pg — ABNORMAL LOW (ref 26.0–34.0)
MCHC: 30.5 g/dL (ref 30.0–36.0)
MCV: 84.9 fL (ref 78.0–100.0)
Platelets: 281 10*3/uL (ref 150–400)
RBC: 2.59 MIL/uL — ABNORMAL LOW (ref 3.87–5.11)
RDW: 15.9 % — ABNORMAL HIGH (ref 11.5–15.5)
WBC: 25.1 10*3/uL — ABNORMAL HIGH (ref 4.0–10.5)

## 2016-04-02 LAB — HEMOGLOBIN AND HEMATOCRIT, BLOOD
HEMATOCRIT: 27.3 % — AB (ref 36.0–46.0)
HEMOGLOBIN: 8.5 g/dL — AB (ref 12.0–15.0)

## 2016-04-02 LAB — GLUCOSE, CAPILLARY
GLUCOSE-CAPILLARY: 184 mg/dL — AB (ref 65–99)
GLUCOSE-CAPILLARY: 269 mg/dL — AB (ref 65–99)
Glucose-Capillary: 112 mg/dL — ABNORMAL HIGH (ref 65–99)
Glucose-Capillary: 348 mg/dL — ABNORMAL HIGH (ref 65–99)

## 2016-04-02 LAB — TROPONIN I

## 2016-04-02 LAB — ALBUMIN: Albumin: 1.6 g/dL — ABNORMAL LOW (ref 3.5–5.0)

## 2016-04-02 LAB — POTASSIUM: POTASSIUM: 5.7 mmol/L — AB (ref 3.5–5.1)

## 2016-04-02 LAB — STREP PNEUMONIAE URINARY ANTIGEN: Strep Pneumo Urinary Antigen: NEGATIVE

## 2016-04-02 LAB — PREPARE RBC (CROSSMATCH)

## 2016-04-02 MED ORDER — INSULIN ASPART 100 UNIT/ML ~~LOC~~ SOLN
0.0000 [IU] | Freq: Three times a day (TID) | SUBCUTANEOUS | Status: DC
  Administered 2016-04-03: 11 [IU] via SUBCUTANEOUS
  Administered 2016-04-03: 7 [IU] via SUBCUTANEOUS
  Administered 2016-04-03: 8 [IU] via SUBCUTANEOUS

## 2016-04-02 MED ORDER — AZITHROMYCIN 500 MG PO TABS
250.0000 mg | ORAL_TABLET | Freq: Every day | ORAL | Status: DC
  Administered 2016-04-03: 250 mg via ORAL
  Filled 2016-04-02: qty 1

## 2016-04-02 MED ORDER — ACETAMINOPHEN 325 MG PO TABS
650.0000 mg | ORAL_TABLET | Freq: Four times a day (QID) | ORAL | Status: DC | PRN
  Administered 2016-04-02: 650 mg via ORAL
  Filled 2016-04-02: qty 2

## 2016-04-02 MED ORDER — TECHNETIUM TC 99M DIETHYLENETRIAME-PENTAACETIC ACID: 32.1000 | Freq: Once | INTRAVENOUS | Status: DC | PRN

## 2016-04-02 MED ORDER — GUAIFENESIN 100 MG/5ML PO SOLN
5.0000 mL | ORAL | Status: DC | PRN
  Administered 2016-04-02 – 2016-04-03 (×3): 100 mg via ORAL
  Filled 2016-04-02 (×3): qty 5

## 2016-04-02 MED ORDER — SODIUM CHLORIDE 0.9 % IV SOLN: Freq: Once | INTRAVENOUS | Status: DC

## 2016-04-02 MED ORDER — AZITHROMYCIN 500 MG PO TABS
500.0000 mg | ORAL_TABLET | Freq: Every day | ORAL | Status: DC
  Administered 2016-04-02: 500 mg via ORAL
  Filled 2016-04-02 (×2): qty 1

## 2016-04-02 MED ORDER — CARVEDILOL 12.5 MG PO TABS
12.5000 mg | ORAL_TABLET | Freq: Two times a day (BID) | ORAL | Status: DC
  Administered 2016-04-02 – 2016-04-03 (×3): 12.5 mg via ORAL
  Filled 2016-04-02 (×3): qty 1

## 2016-04-02 MED ORDER — INSULIN ASPART 100 UNIT/ML ~~LOC~~ SOLN
0.0000 [IU] | Freq: Every day | SUBCUTANEOUS | Status: DC
  Administered 2016-04-02: 4 [IU] via SUBCUTANEOUS

## 2016-04-02 MED ORDER — IPRATROPIUM-ALBUTEROL 0.5-2.5 (3) MG/3ML IN SOLN
3.0000 mL | Freq: Three times a day (TID) | RESPIRATORY_TRACT | Status: DC
  Administered 2016-04-03 (×3): 3 mL via RESPIRATORY_TRACT
  Filled 2016-04-02 (×3): qty 3

## 2016-04-02 MED ORDER — MAGNESIUM HYDROXIDE 400 MG/5ML PO SUSP
960.0000 mL | Freq: Once | ORAL | Status: AC
  Administered 2016-04-02: 960 mL via RECTAL
  Filled 2016-04-02: qty 240

## 2016-04-02 MED ORDER — FUROSEMIDE 80 MG PO TABS
80.0000 mg | ORAL_TABLET | Freq: Every day | ORAL | Status: DC
  Administered 2016-04-02 – 2016-04-03 (×2): 80 mg via ORAL
  Filled 2016-04-02 (×2): qty 1

## 2016-04-02 MED ORDER — SODIUM POLYSTYRENE SULFONATE 15 GM/60ML PO SUSP
15.0000 g | Freq: Once | ORAL | Status: AC
  Administered 2016-04-02: 15 g via ORAL
  Filled 2016-04-02: qty 60

## 2016-04-02 MED ORDER — TECHNETIUM TO 99M ALBUMIN AGGREGATED
4.2000 | Freq: Once | INTRAVENOUS | Status: AC | PRN
  Administered 2016-04-02: 4.2 via INTRAVENOUS

## 2016-04-02 NOTE — Progress Notes (Signed)
Family Medicine Teaching Service Daily Progress Note Intern Pager: [phone]  Patient name: Natalie Porter Medical record number: [id number] Date of birth: 06/18/1940 Age: 76 y.o. Gender: female  Primary Care Provider: Everrett Coombe, MD Consultants: None Code Status: Full   Assessment and Plan: Natalie Porter is a 76 y.o. female with a past medical history significant for HFpEF, T2DM, stage 3 CKD, COPD, GERD, PVD, CAD, HLD, OSA, anemia, lung CA, osteoarthritis who presented with cough and shortness of breath.   #Cough and Shortness of breath Concern for atypical PNA/CAP, on admission WBCs was 24.4 and 25.1 this morning, last lactic acid was 0.81. CXR finding consistent for bronchitis vs mild CHF exacerbation with increased interstitial markings. Patient had trace edema in her legs and mild basilar crackles with a BNP on admission was of  460.5. There is probably a component of CHF to her initial presentation. Our leading diagnosis is COPD exacerbation with a possible viral bronchitis . Patient with a history lung cancer and shortness of breath and symptoms could also have been  secondary to PE. Although D-dimer was elevated V/Q scan was negative essentially ruling PE. WBCs still elevated without any of clear sign of infection. Patient was on oxygen 3L [place] earlier this morning. --Will start patient on azithromycin 500 mg and taper to 250 mg on day 2, monitor clinical improvement deescalate as neede --D/c levaquin  --Will diurese with 80 mg of lasix --Continue Spiriva 18 mcg daily --Continue Duoneb --Continue Prednisone 50 mg daily --F/u on blood cultures  --Will monitor respiratory status --Wean Oxygen off as tolerated --Guaifenesin for cough --Droplet precuations  #Acute on chronic anemia On admission, Hbg 7.5. Baseline ~8.5. Iron studies: Iron 12, TIBC 182 (low), Ferritin 328, pct Sat 7. Consistent with anemia of chronic disease. Given history of CAD, patient is meeting  criteria for transfusion. No obvious signs  of acute blood loss. Patient repeat Hbg of 6.7, patient  was transfused with 1 unit overnight --Follow up on post transfusion H/H --Follow up on FOBT  #Hyperkalemia, chronic On admission, K+5.8, repeat BMP showed slight improvement at 5.6 with last K+ 5.7. Patient has a history of hyperkalemia that could be secondary to ARBs, patient on valsartan. Hyperkalemia could also be due Renal Tubular Acidosis type 4, patient has a adrenal mass and could have hypoaldosteronism which would also explain mild hyponatremia and slight acidosis with elevated chloride. --Start patient on Kayexalate 15 mg qd --Follow up on pm BMP   #Diastolic CHF, stable.  Patient is euvolemic on exam. Bedside limited U/S in ED showed mildly reduced EF without pericardial effusion and depressed CVP. Last echo May 2015 EF 60-65% aortic valve thickened and calcified c/w sclerosis and mild regurg, L atrium mild dilated. BNP 460 today, when patient was seen in the July for CHF exacerbation was noted to be 570. At home on coreg '25mg'$  bid, valsartan '80mg'$  qd, lasix '80mg'$  qd. --Will diurese with 80 mg lasix --Monitor for signs of fluid overload --Consider repeat ECHO   #Acute on chronic kidney disease (CKD III) On admission, creatinine 1.86. This am creatinine is 1.93 s/p gentle rehydration with IVF in the ED. Patient has a baseline creatinine around 1.4 .Patient with poor po intake and recent episodes of emesis and diarrhea.  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 Patient is follow Dr. Hosie Poisson her oncologist in [place] and Dr. Gery Pray, radiation oncologist [facility]). 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 -- Recommend PET scan to assess metabolic activity in newly found masses. Does not need to be done in the inpatient setting  #CAD Cath 2015, 50% calcified LAD, 60% circumflex stenosis, right coronary artery 70% stenosis medical management. ASCVD 46.9% --Continue 81 mg of aspirin --Continue atorvastatin 80 mg  #Constipation, acute Patient denies BM for the past week, had a home enema with minimal results. Patient reports abdominal fullness and some pain. --Will give smog enema --Follow up with bowel regimen as needed  #HTN, This am BP 146/43. Well controlled will continue home regimen and adjust as needed. --Continue Norvasc 10 mg daily --Continue Coreg 25 mg bid --Hold valsartan in the setting of elevated creatinine  #T2DM  Patient is on Lantus 30U at home. Last a1c 7.8 (02/06/16). Since admission CBGs have been within normal limits. --Continue with decreased dose Lantus 10u daily --Patient is on moderate SSI --Continue to monitor CBGs  #PVD, stable History fem-fem bypass graft in 2015 and is s/p iliac artery stent.  Patient was seen by vascular on 02/27/2016 and found to have patent femoral-femoral bypass. Patient will follow up in a year for repeat ABIs. Patient also have left subclavian artery stenosis. --Continue gabapentin 300 mg daily --Take BP in the right arm   #HLD --Continue atorvastatin 80 mg daily  #GERD --Continue Protonix 40 mg daily  #OSA -- Will continue CPAP qhs  #Depression. --Continue Lexapro '20mg'$  daily   FEN/GI: Heart Healthy PPx: Lovenox, Protonix  Disposition: Home pending clinical improvement  Subjective:  Patient is still coughing with deep breath, but has denied fever or chills overnight. Patient has not had a BM in a week and feels distended. She is hungry this  morning but in good spirit. Patient able to ambulate.   Objective: Temp:  [97.8 F (36.6 C)-99.4 F (37.4 C)] 98.7 F (37.1 C) (01/11 0410) Pulse Rate:  [49-67] 63 (01/11 0410) Resp:  [15-22] 22 (01/11 0410) BP: (123-156)/(39-70) 146/43 (01/11 0410) SpO2:  [93 %-100 %] 100 % (01/11 0410) Weight:  [177 lb (80.3 kg)-194 lb 7.1 oz (88.2 kg)] 194 lb 7.1 oz (88.2 kg) (01/10 2249)   Physical Exam:  Laboratory:  Recent Labs Lab 04/01/16 1247 04/02/16 0251  WBC 24.4* 25.1*  HGB 7.5* 6.7*  HCT 24.5* 22.0*  PLT 288 281    Recent Labs Lab 04/01/16 1247 04/02/16 0251  NA 135 134*  K 5.8* 5.6*  CL 111 112*  CO2 15* 14*  BUN 57* 59*  CREATININE 1.86* 1.93*  CALCIUM 8.3* 7.6*  GLUCOSE 104* 215*   D-Dimer: 2.93 Troponin: 0.06-->0.06-->0.03 BNP: 460.9 Lactic acid: 0.92-->0.81 Folate: 11.7 B12: 855  Imaging/Diagnostic Tests:  Dg Chest 2 View  Result Date: 04/01/2016 CLINICAL DATA:  Clinical findings worrisome for pneumonia including productive cough, inspiratory pain on the right, and wheezing. History of Celsius OPD. There is a history of lung malignancy status post radiation therapy. There is a known left apical mass. EXAM: CHEST  2 VIEW COMPARISON:  CT scan of the chest of February 26, 2016 FINDINGS: The lungs are adequately inflated. These soft tissue density mass in the left apex measures 4 cm transversely x 3.6 cm  longitudinally x 4.2 cm AP. The interstitial markings of both lungs are increased as compared to the previous CT scan. There there is increased density in the left lower lung consistent with the abnormalities noted on the previous CT scan. There is a trace of pleural fluid. The cardiac silhouette is enlarged. The central pulmonary vascularity is prominent. There is calcification in the wall of the aortic arch. IMPRESSION: There is no discrete pneumonia. Abnormal masses in the left lung persist but are not greatly changed. The interstitial markings are both lungs are  increased which may reflect acute bronchitic change or mild interstitial edema given the mild cardiomegaly and central pulmonary vascular prominence. Thoracic aortic atherosclerosis. Electronically Signed   By: David  Martinique M.D.   On: 04/01/2016 15:34   Marjie Skiff, MD 04/02/2016, 4:50 AM PGY-1, [place] Intern pager: [phone], text pages welcome

## 2016-04-02 NOTE — Progress Notes (Signed)
CRITICAL VALUE ALERT  Critical value received:  HGB 6.7  Date of notification:  04/02/16  Time of notification:  0300  Critical value read back:yes  Nurse who received alert:  Arthor Captain LPN  MD notified (1st page): DR. Dione Booze Time of first page:  0301  MD notified (2nd page):  Time of second page:  Responding MD:  Dr. Dione Booze  Time MD responded:  [phone]

## 2016-04-02 NOTE — Care Management Note (Signed)
Case Management Note  Patient Details  Name: Natalie Porter MRN: [id number] Date of Birth: 1941-02-04  Subjective/Objective:    Presents  with cough and shortness of breath, history significant for HF, DM, stage 3 CKD, COPD, GERD, PVD, CAD, HLD, OSA, anemia, lung CA, osteoarthritis. Resides with granddaughter(Krystal) who is a CNA. Pt needs help with bathing(tires easily) per daughter Horris Latino, however can dress self. Pt owns a cane and rolator.  [facility] HCPOA (Daughter) Rosie Fate ([place])    [phone]      PCP: Dr.FENG  Action/Plan: Return to home when medically stable. CM to f/u with disposition needs.  Expected Discharge Date:  04/03/16               Expected Discharge Plan:  [place]  In-House Referral:     Discharge planning Services  CM Consult  Post Acute Care Choice:    Choice offered to:  Patient, Adult Children Horris Latino, daughter)  DME Arranged:    DME Agency:     HH Arranged:    [place] Agency:  [place] ([place] selected if home health services are needed @ d/c)  Status of Service:  In process, will continue to follow  If discussed at Long Length of Stay Meetings, dates discussed:    Additional Comments:  Sharin Mons, RN 04/02/2016, 11:17 AM

## 2016-04-02 NOTE — Progress Notes (Signed)
Patient has her home unit cpap. RT assisted in turning on the machine. Patient uses her nasal prongs by herself and is now resting comfortably.

## 2016-04-02 NOTE — Progress Notes (Signed)
Received page from RN for a Hemoglobin of 6.7. Talked with RN, I will order a type and screen  with plan to transfuse patient with 1 unit of pRBCs. Patient has CAD as well as other co-morbidities and has a transfusion threshold of 7.0 . Post transfusion H/H was also ordered. Patient also have CHF will need to manage volume. Patient's VS are stable and she is asymptomatic and sleeping. Plan to discuss with patient reason for transfusion prior to her receiving blood products.  Marjie Skiff, MD PGY-1 Family Medicine

## 2016-04-03 ENCOUNTER — Encounter (HOSPITAL_COMMUNITY): Payer: Self-pay | Admitting: General Practice

## 2016-04-03 LAB — CBC
HEMATOCRIT: 28.3 % — AB (ref 36.0–46.0)
HEMOGLOBIN: 8.9 g/dL — AB (ref 12.0–15.0)
MCH: 26.4 pg (ref 26.0–34.0)
MCHC: 31.4 g/dL (ref 30.0–36.0)
MCV: 84 fL (ref 78.0–100.0)
Platelets: 306 10*3/uL (ref 150–400)
RBC: 3.37 MIL/uL — ABNORMAL LOW (ref 3.87–5.11)
RDW: 15.6 % — ABNORMAL HIGH (ref 11.5–15.5)
WBC: 30.4 10*3/uL — AB (ref 4.0–10.5)

## 2016-04-03 LAB — TYPE AND SCREEN
ABO/RH(D): A POS
ANTIBODY SCREEN: NEGATIVE
UNIT DIVISION: 0

## 2016-04-03 LAB — BASIC METABOLIC PANEL
Anion gap: 9 (ref 5–15)
BUN: 59 mg/dL — AB (ref 6–20)
CALCIUM: 8 mg/dL — AB (ref 8.9–10.3)
CHLORIDE: 110 mmol/L (ref 101–111)
CO2: 15 mmol/L — ABNORMAL LOW (ref 22–32)
CREATININE: 1.75 mg/dL — AB (ref 0.44–1.00)
GFR calc non Af Amer: 27 mL/min — ABNORMAL LOW (ref >60)
GFR, EST AFRICAN AMERICAN: 31 mL/min — AB (ref >60)
Glucose, Bld: 290 mg/dL — ABNORMAL HIGH (ref 65–99)
Potassium: 5.4 mmol/L — ABNORMAL HIGH (ref 3.5–5.1)
SODIUM: 134 mmol/L — AB (ref 135–145)

## 2016-04-03 LAB — GLUCOSE, CAPILLARY
GLUCOSE-CAPILLARY: 303 mg/dL — AB (ref 65–99)
GLUCOSE-CAPILLARY: 257 mg/dL — AB (ref 65–99)
Glucose-Capillary: 271 mg/dL — ABNORMAL HIGH (ref 65–99)

## 2016-04-03 MED ORDER — HYDRALAZINE HCL 10 MG PO TABS: 10.0000 mg | ORAL_TABLET | ORAL | Status: DC | PRN

## 2016-04-03 MED ORDER — PREDNISONE 50 MG PO TABS: 50.0000 mg | ORAL_TABLET | Freq: Every day | ORAL | 0 refills | Status: DC

## 2016-04-03 MED ORDER — AZITHROMYCIN 250 MG PO TABS: 250.0000 mg | ORAL_TABLET | Freq: Every day | ORAL | 0 refills | Status: DC

## 2016-04-03 MED ORDER — SODIUM POLYSTYRENE SULFONATE 15 GM/60ML PO SUSP
15.0000 g | Freq: Once | ORAL | Status: AC
  Administered 2016-04-03: 15 g via ORAL
  Filled 2016-04-03: qty 60

## 2016-04-03 MED ORDER — INSULIN GLARGINE 100 UNIT/ML ~~LOC~~ SOLN: 20.0000 [IU] | Freq: Every day | SUBCUTANEOUS | 2 refills | Status: AC

## 2016-04-03 NOTE — Progress Notes (Signed)
Family Medicine Teaching Service Daily Progress Note Intern Pager: [phone]  Patient name: Natalie Porter Medical record number: [id number] Date of birth: 02-28-1941 Age: 75 y.o. Gender: female  Primary Care Provider: Everrett Coombe, MD Consultants: None Code Status: Full   Assessment and Plan: Natalie Porter is a 76 y.o. female with a past medical history significant for HFpEF, T2DM, stage 3 CKD, COPD, GERD, PVD, CAD, HLD, OSA, anemia, lung CA, osteoarthritis who presented with cough and shortness of breath.   #Cough and Shortness of breath, improving Patient on RA with good oxygen saturation. Blood cultures have shown no growth to date. Will continue to treat as viral bronchitis/COPD exacerbation s/p recent flu infection. Patient cough is still present most likely secondary to viral infection.  --Continue on azithromycin 250 mg daily, DAY 2 --Will diurese with 80 mg of lasix --Continue Spiriva 18 mcg daily --Continue Duoneb --Continue Prednisone 50 mg daily --Will monitor respiratory status --Guaifenesin for cough --Droplet precuations  #Acute on chronic anemia, stable On admission, Hbg 7.5. Baseline ~8.5. Iron studies consistent with anemia of chronic disease. Patient received one unit of blood yesterday after Hbg drop to 6.7. Past transfusion Hbg is 8.5. Will continue to monitor --Follow up on FOBT  #Hyperkalemia, chronic On admission, K+5.8, repeat BMP showed slight improvement at 5.6 with last K+ 5.7. Patient K+ this am is slightly improved after lasix and kayexalate. --Give patient Kayexalate 15 mg qd --Follow up on BMP   #Diastolic CHF, stable.  Patient is euvolemic on exam. Trace edema in LE on exam. No crackles on pulm exam.  --d/c  80 mg lasix --Monitor for signs of fluid overload --Consider repeat ECHO   #Acute on chronic kidney disease (CKD III) On admission, creatinine 1.86. This am creatinine is 1.75 s/p Lasix.  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 Patient is follow Dr. Hosie Poisson her oncologist in [place] and Dr. Gery Pray, radiation oncologist [facility]). 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 -- Recommend PET scan to assess metabolic activity in newly found masses. Does not need to be done in the inpatient setting  #CAD Cath 2015, 50% calcified LAD, 60% circumflex stenosis, right coronary artery 70% stenosis medical management. ASCVD 46.9% --Continue 81 mg of aspirin --Continue atorvastatin 80 mg  #Constipation, acute Patient had significant BM s/p smog enema. Abdominal pain and fullness has significantly improved --Consider soap suds enema prior to discharge --Follow up with bowel regimen as needed  #HTN, This am BP 173/43. Will continue to monitor, would not want to drop DBP more than it is. Patient is currently stable. --Continue Norvasc 10 mg daily --Continue Coreg 25 mg bid --Hold valsartan in the setting of elevated creatinine  #T2DM  Patient is on Lantus 30U at home. Last a1c 7.8 (02/06/16). Since admission CBGs have been within normal limits. --Continue with decreased dose Lantus 10u daily --Patient is on moderate SSI --Continue to monitor CBGs  #PVD, stable History fem-fem bypass graft in 2015 and is s/p iliac artery stent.  Patient was seen by vascular on 02/27/2016 and found to have patent femoral-femoral bypass. Patient will follow up  in a year for repeat ABIs. Patient also have left subclavian artery stenosis. --Continue gabapentin 300 mg daily --Take BP in the  right arm   #HLD --Continue atorvastatin 80 mg daily  #GERD --Continue Protonix 40 mg daily  #OSA -- Will continue CPAP qhs  #Depression. --Continue Lexapro '20mg'$  daily   FEN/GI: Heart Healthy PPx: Lovenox, Protonix  Disposition: Home pending clinical improvement  Subjective:  Patient's cough and SOB has improved overnight. Patient is ambulating and taking good po. Patient with good BM.  Objective: Temp:  [97.6 F (36.4 C)-98.5 F (36.9 C)] 98 F (36.7 C) (01/12 0604) Pulse Rate:  [54-64] 63 (01/12 0604) Resp:  [16-22] 22 (01/12 0604) BP: (140-173)/(43-68) 173/43 (01/12 0604) SpO2:  [95 %-98 %] 98 % (01/12 0604)   Physical Exam:  General: NAD, pleasant, able to participate in exam Cardiac: RRR, normal heart sounds, no murmurs. 2+ radial and PT pulses bilaterally Respiratory: CTAB, normal effort, No wheezes, rales or rhonchi Abdomen: soft, nontender, nondistended, no hepatic or splenomegaly, +BS Extremities: no edema or cyanosis. WWP. Skin: warm and dry, no rashes noted Neuro: alert and oriented x4, no focal deficits Psych: Normal affect and mood  Laboratory:  Recent Labs Lab 04/01/16 1247 04/02/16 0251 04/02/16 1647  WBC 24.4* 25.1*  --   HGB 7.5* 6.7* 8.5*  HCT 24.5* 22.0* 27.3*  PLT 288 281  --     Recent Labs Lab 04/01/16 1247 04/02/16 0251 04/02/16 0920  NA 135 134*  --   K 5.8* 5.6* 5.7*  CL 111 112*  --   CO2 15* 14*  --   BUN 57* 59*  --   CREATININE 1.86* 1.93*  --   CALCIUM 8.3* 7.6*  --   GLUCOSE 104* 215*  --    D-Dimer: 2.93 Troponin: 0.06-->0.06-->0.03 BNP: 460.9 Lactic acid: 0.92-->0.81 Folate: 11.7 B12: 855  Imaging/Diagnostic Tests:  Dg Chest 2 View  Result Date: 04/01/2016 CLINICAL DATA:  Clinical findings worrisome for pneumonia including productive cough, inspiratory pain on the right, and wheezing. History of Celsius OPD. There is a history of lung malignancy status post radiation therapy. There is a known left  apical mass. EXAM: CHEST  2 VIEW COMPARISON:  CT scan of the chest of February 26, 2016 FINDINGS: The lungs are adequately inflated. These soft tissue density mass in the left apex measures 4 cm transversely x 3.6 cm longitudinally x 4.2 cm AP. The interstitial markings of both lungs are increased as compared to the previous CT scan. There there is increased density in the left lower lung consistent with the abnormalities noted on the previous CT scan. There is a trace of pleural fluid. The cardiac silhouette is enlarged. The central pulmonary vascularity is prominent. There is calcification in the wall of the aortic arch. IMPRESSION: There is no discrete pneumonia. Abnormal masses in the left lung persist but are not greatly changed. The interstitial markings are both lungs are increased which may reflect acute bronchitic change or mild interstitial edema given the mild cardiomegaly and central pulmonary vascular prominence. Thoracic aortic atherosclerosis. Electronically Signed   By: David  Martinique M.D.   On: 04/01/2016 15:34   Marjie Skiff, MD 04/03/2016, 6:31 AM PGY-1, [place] Intern pager: [phone], text pages welcome

## 2016-04-03 NOTE — Progress Notes (Signed)
Transitions of Care Pharmacy Note  Plan:  -Educated on new medications: Azithromycin and Prednisone; also reminded patient that Valsartan will be discontinued at discharge.  -Addressed concerns regarding changes to medications, especially insulin.  -Recommend decreasing Lantus dose at D/C as patient has only been receiving 10 units qhs (plus SSI) while inpatient - which is different from PTA dose.  --------------------------------------------- Natalie Porter is an 76 y.o. female who presents with a chief complaint of cough and shortness of breath. In anticipation of discharge, pharmacy has reviewed this patient's prior to admission medication history, as well as current inpatient medications listed per the [facility].  Current medication indications, dosing, frequency, and notable side effects reviewed with patient. patient verbalized understanding of current inpatient medication regimen and is aware that the After Visit Summary when presented, will represent the most accurate medication list at discharge.   Natalie Porter expressed concerns regarding changes to her medications. We discussed her new medications, prednisone and azithromycin. She is aware of duration of therapy, as well as side effects of these medications. She knows to pick them up at the pharmacy and start taking them tomorrow. We discussed her insulin regimen as it has been different in the hospital compared to PTA. I encouraged her to keep a close eye on her blood glucose, especially in a few days as she comes off her prednisone. I reminded her that her valsartan will also be discontinued at discharge. She has additionally had a Lantus dose change (30 units qhs PTA and now 10 units qhs inpatient). I spoke with family medicine about this and they decided to resume her at Lantus 20 units qhs when discharged. The patient is aware of this change.   Time was allotted for discussion and the patient reports no further questions.     Assessment: Understanding of regimen: fair Understanding of indications: good Potential of compliance: good Barriers to Obtaining Medications: No  Patient instructed to contact inpatient pharmacy team with further questions or concerns if needed.    Time spent preparing for discharge counseling: 20 min  Time spent counseling patient: 30 min    Thank you for allowing pharmacy to be a part of this patient's care.  Argie Ramming, PharmD Pharmacy Resident  Pager [phone] 04/03/16 5:00 PM

## 2016-04-03 NOTE — Progress Notes (Addendum)
Inpatient Diabetes Program Recommendations  AACE/ADA: New Consensus Statement on Inpatient Glycemic Control (2015)  Target Ranges:  Prepandial:   less than 140 mg/dL      Peak postprandial:   less than 180 mg/dL (1-2 hours)      Critically ill patients:  140 - 180 mg/dL   Results for RILLEY, POULTER (MRN [id number]) as of 04/03/2016 09:35  Ref. Range 04/02/2016 08:19 04/02/2016 11:57 04/02/2016 17:07 04/02/2016 21:07 04/03/2016 07:48  Glucose-Capillary Latest Ref Range: 65 - 99 mg/dL 112 (H) 184 (H) 269 (H) 348 (H) 271 (H)   Review of Glycemic Control  Diabetes history: DM 2 Outpatient Diabetes medications: Lantus 30 units Current orders for Inpatient glycemic control: Lantus 10 units, Novolog Moderate TID + HS  Inpatient Diabetes Program Recommendations:   Patient's glucose trends have increased into the 200-300 range after PO prednisone 50 mg Daily started. Patient takes a higher basal insulin dose at home. Please consider increasing Lantus to 15-18 units.  Thanks,  Tama Headings RN, MSN, [facility] Inpatient Diabetes Coordinator Team Pager [phone] (8a-5p)

## 2016-04-03 NOTE — Progress Notes (Signed)
FPTS Interim Progress Note  Stopped in to see the patient socially. Overall she is feeling much better, and appears much better. States she can feel improvement in her breathing now with steroids on board, and overall feels better after receiving a blood transfusion. She still feels constipated and is s/p an enema yesterday, and notes the plan is to receive another enema today.  The patient's general appearance is certainly improved from her office visit two days ago.   Will continue to follow along the care of the inpatient team and see the patient at her hospital follow up.   Everrett Coombe, MD 04/03/2016, 11:01 AM PGY-1, [place] Medicine Service pager [phone]

## 2016-04-03 NOTE — Discharge Instructions (Signed)
Chronic Obstructive Pulmonary Disease Exacerbation Chronic obstructive pulmonary disease (COPD) is a common lung condition in which airflow from the lungs is limited. COPD is a general term that can be used to describe many different lung problems that limit airflow, including chronic bronchitis and emphysema. COPD exacerbations are episodes when breathing symptoms become much worse and require extra treatment. Without treatment, COPD exacerbations can be life threatening, and frequent COPD exacerbations can cause further damage to your lungs. What are the causes?  Respiratory infections.  Exposure to smoke.  Exposure to air pollution, chemical fumes, or dust. Sometimes there is no apparent cause or trigger. What increases the risk?  Smoking cigarettes.  Older age.  Frequent prior COPD exacerbations. What are the signs or symptoms?  Increased coughing.  Increased thick spit (sputum) production.  Increased wheezing.  Increased shortness of breath.  Rapid breathing.  Chest tightness. How is this diagnosed? Your medical history, a physical exam, and tests will help your health care provider make a diagnosis. Tests may include:  A chest X-ray.  Basic lab tests.  Sputum testing.  An arterial blood gas test.  How is this treated? Depending on the severity of your COPD exacerbation, you may need to be admitted to a hospital for treatment. Some of the treatments commonly used to treat COPD exacerbations are:  Antibiotic medicines.  Bronchodilators. These are drugs that expand the air passages. They may be given with an inhaler or nebulizer. Spacer devices may be needed to help improve drug delivery.  Corticosteroid medicines.  Supplemental oxygen therapy.  Airway clearing techniques, such as noninvasive ventilation (NIV) and positive expiratory pressure (PEP). These provide respiratory support through a mask or other noninvasive device.  Follow these instructions at  home:  Do not smoke. Quitting smoking is very important to prevent COPD from getting worse and exacerbations from happening as often.  Avoid exposure to all substances that irritate the airway, especially to tobacco smoke.  If you were prescribed an antibiotic medicine, finish it all even if you start to feel better.  Take all medicines as directed by your health care provider.It is important to use correct technique with inhaled medicines.  Drink enough fluids to keep your urine clear or pale yellow (unless you have a medical condition that requires fluid restriction).  Use a cool mist vaporizer. This makes it easier to clear your chest when you cough.  If you have a home nebulizer and oxygen, continue to use them as directed.  Maintain all necessary vaccinations to prevent infections.  Exercise regularly.  Eat a healthy diet.  Keep all follow-up appointments as directed by your health care provider. Get help right away if:  You have worsening shortness of breath.  You have trouble talking.  You have severe chest pain.  You have blood in your sputum.  You have a fever.  You have weakness, vomit repeatedly, or faint.  You feel confused.  You continue to get worse. This information is not intended to replace advice given to you by your health care provider. Make sure you discuss any questions you have with your health care provider. Document Released: 01/04/2007 Document Revised: 08/15/2015 Document Reviewed: 11/11/2012 Elsevier Interactive Patient Education  2017 [employer]  

## 2016-04-04 LAB — LEGIONELLA PNEUMOPHILA SEROGP 1 UR AG: L. pneumophila Serogp 1 Ur Ag: NEGATIVE

## 2016-04-06 LAB — CULTURE, BLOOD (ROUTINE X 2)
CULTURE: NO GROWTH
Culture: NO GROWTH

## 2016-04-09 ENCOUNTER — Inpatient Hospital Stay: Payer: Medicare Other | Admitting: Student in an Organized Health Care Education/Training Program

## 2016-04-09 NOTE — Progress Notes (Deleted)
  HPI:  Natalie Porter presents for hospital follow up. Patient was hospitalized from *** to 04/03/2016 with COPD exacerbation in the setting of a viral illness. She completed a course of azithromycin and prednisone burst.  She was found to be anemic consistent with anemia of chronic disease on admission. She was found to be hyperkalemic on admission.   Patient now reports ***  - F/u Anemia (ACD), f/u potassium, f/u constpation, f/u BP (no more room in diastolic blood pressure duringa dmission)   ROS: See HPI.  [place]: ***  PHYSICAL EXAM: There were no vitals taken for this visit. Gen: *** HEENT: *** Heart: *** Lungs: *** Neuro: *** Ext: ***  ASSESSMENT/PLAN:  No problem-specific Assessment & Plan notes found for this encounter.  FOLLOW UP: Follow up in *** for ***  Everrett Coombe, MD, [place] PGY1 [place]

## 2016-04-10 ENCOUNTER — Inpatient Hospital Stay: Payer: Medicare Other | Admitting: Student in an Organized Health Care Education/Training Program

## 2016-04-15 ENCOUNTER — Other Ambulatory Visit: Payer: Self-pay | Admitting: Student in an Organized Health Care Education/Training Program

## 2016-04-16 ENCOUNTER — Other Ambulatory Visit: Payer: Self-pay | Admitting: Student in an Organized Health Care Education/Training Program

## 2016-04-16 ENCOUNTER — Telehealth: Payer: Self-pay | Admitting: Family Medicine

## 2016-04-16 MED ORDER — ESCITALOPRAM OXALATE 20 MG PO TABS: 20.0000 mg | ORAL_TABLET | Freq: Every day | ORAL | 0 refills | Status: AC

## 2016-04-16 NOTE — Discharge Summary (Signed)
Natalie Porter  Patient name: Natalie Porter Medical record number: [id number] Date of birth: 1940/12/25 Age: 76 y.o. Gender: female Date of Admission: 04/01/2016  Date of Discharge:04/03/2016 Admitting Physician: Natalie Feil, MD  Primary Care Provider: Everrett Coombe, MD Consultants: None  Indication for Hospitalization: Cough and shortness of breath  Discharge Diagnoses/Problem List:  CHF T2DM CKDIII COPD GERD HLD  Disposition: Home  Discharge Condition: Stable  Discharge Exam:  General: NAD, pleasant, able to participate in exam Cardiac: RRR, normal heart sounds, no murmurs. 2+ radial and PT pulses bilaterally Respiratory: CTAB, normal effort, No wheezes, rales or rhonchi Abdomen: soft, nontender, nondistended, no hepatic or splenomegaly, +BS Extremities: no edema or cyanosis. WWP. Skin: warm and dry, no rashes noted Neuro: alert and oriented x4, no focal deficits Psych: Normal affect and mood  Brief Hospital Course:  Natalie Porter a 76 y.o.femalewith a past medical history significant for HFpEF, T2DM, stage 3 CKD, COPD, GERD, PVD, CAD, HLD, OSA, anemia, lung CA, osteoarthritis who presented with cough and shortness of breath. Patient was recently treated for flu. Initial presentation consistent with COPD exacerbation in the setting of viral infection. Patient with initial leukocytosis to 24.4 but a lactic acid of 0.81. CXR findings consistent with bronchitis vs mild CHF exacerbation though BNP was 460.5. V/Q scan was negative for PE despite elevated d dimers. Patient initially requiring 3L [place] oxygen and was transitioned to azitthromycin after being started on Levaquin in the ED. Patient was also started on lasix for diuresis. In addition, patient hemoglobin was low on admission at 7.5 one unit below baseline. Patient was transfused one unit of pRBCs. 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 Potassium was repleted with Kayexalate during stay . With low iron and ferritin consistent with anemia of chronic disease. Patient also endorsed severe constipation and received enema in addition to bowel regimen and subsequently was regular. On hospital day 2 patient  was transition azithromycin has breathing improved and she maintain good saturations on room air. Patient continue to improve with COPD treatment and gentle diuresis. On discharge day patient was stable, she did not require any oxygen, we continue to hold losartan with AKI and history of CKD III.  Issues for Follow Up:  1. Ensure patient completed course of azithromycin 3 more days to go post discharge. 2. Please make sure to follow up on BMP and CBC 3. Will hold losartan and reassess kidney function, if improve restart. 4. Recommendation for PET to assess newly found masses  Significant Procedures: None  Significant Labs and Imaging:  CBC Latest Ref Rng & Units 04/03/2016 04/02/2016 04/02/2016  WBC 4.0 - 10.5 K/uL 30.4(H) - 25.1(H)  Hemoglobin 12.0 - 15.0 g/dL 8.9(L) 8.5(L) 6.7(LL)  Hematocrit 36.0 - 46.0 % 28.3(L) 27.3(L) 22.0(L)  Platelets 150 - 400 K/uL 306 - 281   BMP Latest Ref Rng & Units 04/03/2016 04/02/2016 04/02/2016  Glucose 65 - 99 mg/dL 290(H) - 215(H)  BUN 6 - 20 mg/dL 59(H) - 59(H)  Creatinine 0.44 - 1.00 mg/dL 1.75(H) - 1.93(H)  Sodium 135 - 145 mmol/L 134(L) - 134(L)  Potassium 3.5 - 5.1 mmol/L 5.4(H) 5.7(H) 5.6(H)  Chloride 101 - 111 mmol/L 110 - 112(H)  CO2 22 - 32 mmol/L 15(L) - 14(L)  Calcium 8.9 - 10.3 mg/dL 8.0(L) - 7.6(L)     Results/Tests Pending at Time of Discharge: None   Discharge Medications:  Allergies as of 04/03/2016  Reactions   Codeine Nausea Only   Can tolerate in lower doses   Morphine And Related Nausea And Vomiting   Can tolerate in lower doses      Medication List    STOP taking these medications   valsartan 80 MG tablet Commonly known as:  DIOVAN     TAKE  these medications   acetaminophen-codeine 300-15 MG tablet Commonly known as:  TYLENOL #2 Take 1 tablet by mouth every 4 (four) hours as needed for moderate pain.   albuterol 108 (90 Base) MCG/ACT inhaler Commonly known as:  PROVENTIL HFA;VENTOLIN HFA Inhale 2 puffs into the lungs every 4 (four) hours as needed for wheezing or shortness of breath.   amLODipine 10 MG tablet Commonly known as:  NORVASC Take 1 tablet (10 mg total) by mouth daily.   aspirin EC 81 MG tablet Take 81 mg by mouth daily.   atorvastatin 80 MG tablet Commonly known as:  LIPITOR Take 1 tablet (80 mg total) by mouth daily.   azithromycin 250 MG tablet Commonly known as:  ZITHROMAX Take 1 tablet (250 mg total) by mouth daily.   bisacodyl 10 MG suppository Commonly known as:  DULCOLAX Place 1 suppository (10 mg total) rectally as needed for moderate constipation.   CALCIUM-CARB 600 PO Take 600 mg by mouth 2 (two) times daily.   carvedilol 25 MG tablet Commonly known as:  COREG Take 1 tablet (25 mg total) by mouth 2 (two) times daily with a meal.   fluticasone 50 MCG/ACT nasal spray Commonly known as:  FLONASE Place 2 sprays into both nostrils daily. What changed:  when to take this  reasons to take this   furosemide 80 MG tablet Commonly known as:  LASIX Take 1 tablet (80 mg total) by mouth daily.   gabapentin 100 MG capsule Commonly known as:  NEURONTIN Take 2-3 capsules (200-300 mg total) by mouth 2 (two) times daily. 200 mg in the morning and 300 mg at bedtime What changed:  when to take this  additional instructions   glucose blood test strip Use as instructed   insulin glargine 100 UNIT/ML injection Commonly known as:  LANTUS Inject 0.2 mLs (20 Units total) into the skin at bedtime. What changed:  how much to take   ipratropium-albuterol 0.5-2.5 (3) MG/3ML Soln Commonly known as:  DUONEB Take 3 mLs by nebulization every 4 (four) hours as needed. What changed:  when to take  this   omeprazole 20 MG capsule Commonly known as:  PRILOSEC Take 1 capsule (20 mg total) by mouth daily.   polyethylene glycol powder powder Commonly known as:  GLYCOLAX/MIRALAX Take 17 g by mouth 3 (three) times daily as needed for moderate constipation.   predniSONE 50 MG tablet Commonly known as:  DELTASONE Take 1 tablet (50 mg total) by mouth daily with breakfast.   tiotropium 18 MCG inhalation capsule Commonly known as:  SPIRIVA Place 1 capsule (18 mcg total) into inhaler and inhale daily.       Discharge Instructions: Please refer to Patient Instructions section of EMR for full details.  Patient was counseled important signs and symptoms that should prompt return to medical care, changes in medications, dietary instructions, activity restrictions, and follow up appointments.   Follow-Up Appointments: Follow-up Information    Natalie Coombe, MD Follow up.   Contact information: [place] [id number] [phone]           Marjie Skiff, MD 04/16/2016, 10:23 PM PGY-1, [place]  [facility]

## 2016-04-16 NOTE — Telephone Encounter (Signed)
Patient's daughter called inquiring about her insulin regimen. I was asked to answer her question as I am one of the preceptors.  Patient's daughter held patient's insulin last night (normally is on lantus 20 units daily). Daughter apparently thought patient did not need insulin last night. This morning sugar was 99. Yesterday morning it was in the 80s.  As fasting sugar was less than 100 while not on any insulin, instructed daughter via April CMA to hold insulin again tonight and call us back in the morning with what patient's sugar is.  FYI to PCP  Leeanne Rio, MD

## 2016-04-16 NOTE — Progress Notes (Signed)
Pt informed rx has been sent. Katharina Caper, April D, [place]

## 2016-04-17 ENCOUNTER — Inpatient Hospital Stay (HOSPITAL_COMMUNITY)
Admission: EM | Admit: 2016-04-17 | Discharge: 2016-04-19 | DRG: 181 | Disposition: A | Discharge status: Hospice | Payer: Medicare Other | Attending: Family Medicine | Admitting: Family Medicine

## 2016-04-17 ENCOUNTER — Encounter (HOSPITAL_COMMUNITY): Payer: Self-pay

## 2016-04-17 DIAGNOSIS — E1151 Type 2 diabetes mellitus with diabetic peripheral angiopathy without gangrene: Secondary | ICD-10-CM | POA: Diagnosis present

## 2016-04-17 DIAGNOSIS — J449 Chronic obstructive pulmonary disease, unspecified: Secondary | ICD-10-CM | POA: Diagnosis present

## 2016-04-17 DIAGNOSIS — I130 Hypertensive heart and chronic kidney disease with heart failure and stage 1 through stage 4 chronic kidney disease, or unspecified chronic kidney disease: Secondary | ICD-10-CM | POA: Diagnosis present

## 2016-04-17 DIAGNOSIS — N183 Chronic kidney disease, stage 3 (moderate): Secondary | ICD-10-CM | POA: Diagnosis present

## 2016-04-17 DIAGNOSIS — Z66 Do not resuscitate: Secondary | ICD-10-CM | POA: Diagnosis present

## 2016-04-17 DIAGNOSIS — I2510 Atherosclerotic heart disease of native coronary artery without angina pectoris: Secondary | ICD-10-CM | POA: Diagnosis present

## 2016-04-17 DIAGNOSIS — I5032 Chronic diastolic (congestive) heart failure: Secondary | ICD-10-CM | POA: Diagnosis present

## 2016-04-17 DIAGNOSIS — Z833 Family history of diabetes mellitus: Secondary | ICD-10-CM

## 2016-04-17 DIAGNOSIS — K219 Gastro-esophageal reflux disease without esophagitis: Secondary | ICD-10-CM | POA: Diagnosis present

## 2016-04-17 DIAGNOSIS — Z515 Encounter for palliative care: Secondary | ICD-10-CM | POA: Diagnosis present

## 2016-04-17 DIAGNOSIS — C3490 Malignant neoplasm of unspecified part of unspecified bronchus or lung: Secondary | ICD-10-CM | POA: Diagnosis not present

## 2016-04-17 DIAGNOSIS — Z8673 Personal history of transient ischemic attack (TIA), and cerebral infarction without residual deficits: Secondary | ICD-10-CM

## 2016-04-17 DIAGNOSIS — E860 Dehydration: Secondary | ICD-10-CM | POA: Diagnosis present

## 2016-04-17 DIAGNOSIS — I248 Other forms of acute ischemic heart disease: Secondary | ICD-10-CM | POA: Diagnosis present

## 2016-04-17 DIAGNOSIS — Z809 Family history of malignant neoplasm, unspecified: Secondary | ICD-10-CM | POA: Diagnosis not present

## 2016-04-17 DIAGNOSIS — R011 Cardiac murmur, unspecified: Secondary | ICD-10-CM | POA: Diagnosis not present

## 2016-04-17 DIAGNOSIS — Z79899 Other long term (current) drug therapy: Secondary | ICD-10-CM | POA: Diagnosis not present

## 2016-04-17 DIAGNOSIS — A09 Infectious gastroenteritis and colitis, unspecified: Secondary | ICD-10-CM | POA: Diagnosis present

## 2016-04-17 DIAGNOSIS — D638 Anemia in other chronic diseases classified elsewhere: Secondary | ICD-10-CM | POA: Diagnosis present

## 2016-04-17 DIAGNOSIS — E1121 Type 2 diabetes mellitus with diabetic nephropathy: Secondary | ICD-10-CM | POA: Diagnosis present

## 2016-04-17 DIAGNOSIS — Z86718 Personal history of other venous thrombosis and embolism: Secondary | ICD-10-CM

## 2016-04-17 DIAGNOSIS — M1990 Unspecified osteoarthritis, unspecified site: Secondary | ICD-10-CM | POA: Diagnosis present

## 2016-04-17 DIAGNOSIS — Z7982 Long term (current) use of aspirin: Secondary | ICD-10-CM

## 2016-04-17 DIAGNOSIS — R110 Nausea: Secondary | ICD-10-CM

## 2016-04-17 DIAGNOSIS — K6389 Other specified diseases of intestine: Secondary | ICD-10-CM | POA: Diagnosis not present

## 2016-04-17 DIAGNOSIS — Z7189 Other specified counseling: Secondary | ICD-10-CM | POA: Diagnosis not present

## 2016-04-17 DIAGNOSIS — R404 Transient alteration of awareness: Secondary | ICD-10-CM | POA: Diagnosis not present

## 2016-04-17 DIAGNOSIS — Z9989 Dependence on other enabling machines and devices: Secondary | ICD-10-CM

## 2016-04-17 DIAGNOSIS — Z794 Long term (current) use of insulin: Secondary | ICD-10-CM

## 2016-04-17 DIAGNOSIS — G629 Polyneuropathy, unspecified: Secondary | ICD-10-CM | POA: Diagnosis present

## 2016-04-17 DIAGNOSIS — R531 Weakness: Secondary | ICD-10-CM

## 2016-04-17 DIAGNOSIS — G4733 Obstructive sleep apnea (adult) (pediatric): Secondary | ICD-10-CM | POA: Diagnosis present

## 2016-04-17 DIAGNOSIS — Z8249 Family history of ischemic heart disease and other diseases of the circulatory system: Secondary | ICD-10-CM

## 2016-04-17 DIAGNOSIS — R1110 Vomiting, unspecified: Secondary | ICD-10-CM | POA: Diagnosis not present

## 2016-04-17 DIAGNOSIS — Z823 Family history of stroke: Secondary | ICD-10-CM

## 2016-04-17 DIAGNOSIS — E1122 Type 2 diabetes mellitus with diabetic chronic kidney disease: Secondary | ICD-10-CM | POA: Diagnosis present

## 2016-04-17 DIAGNOSIS — D72829 Elevated white blood cell count, unspecified: Secondary | ICD-10-CM

## 2016-04-17 DIAGNOSIS — F329 Major depressive disorder, single episode, unspecified: Secondary | ICD-10-CM | POA: Diagnosis present

## 2016-04-17 DIAGNOSIS — R197 Diarrhea, unspecified: Secondary | ICD-10-CM

## 2016-04-17 DIAGNOSIS — Z87891 Personal history of nicotine dependence: Secondary | ICD-10-CM

## 2016-04-17 DIAGNOSIS — E785 Hyperlipidemia, unspecified: Secondary | ICD-10-CM | POA: Diagnosis present

## 2016-04-17 DIAGNOSIS — Z885 Allergy status to narcotic agent status: Secondary | ICD-10-CM

## 2016-04-17 DIAGNOSIS — M6281 Muscle weakness (generalized): Secondary | ICD-10-CM | POA: Diagnosis not present

## 2016-04-17 DIAGNOSIS — Z923 Personal history of irradiation: Secondary | ICD-10-CM

## 2016-04-17 DIAGNOSIS — Z79891 Long term (current) use of opiate analgesic: Secondary | ICD-10-CM

## 2016-04-17 LAB — URINALYSIS, ROUTINE W REFLEX MICROSCOPIC
BILIRUBIN URINE: NEGATIVE
GLUCOSE, UA: NEGATIVE mg/dL
HGB URINE DIPSTICK: NEGATIVE
KETONES UR: NEGATIVE mg/dL
Leukocytes, UA: NEGATIVE
Nitrite: NEGATIVE
PH: 5 (ref 5.0–8.0)
PROTEIN: NEGATIVE mg/dL
Specific Gravity, Urine: 1.012 (ref 1.005–1.030)

## 2016-04-17 LAB — COMPREHENSIVE METABOLIC PANEL
ALBUMIN: 1.6 g/dL — AB (ref 3.5–5.0)
ALT: 18 U/L (ref 14–54)
AST: 13 U/L — AB (ref 15–41)
Alkaline Phosphatase: 117 U/L (ref 38–126)
Anion gap: 8 (ref 5–15)
BUN: 41 mg/dL — AB (ref 6–20)
CHLORIDE: 111 mmol/L (ref 101–111)
CO2: 17 mmol/L — ABNORMAL LOW (ref 22–32)
CREATININE: 1.52 mg/dL — AB (ref 0.44–1.00)
Calcium: 7.6 mg/dL — ABNORMAL LOW (ref 8.9–10.3)
GFR calc Af Amer: 37 mL/min — ABNORMAL LOW (ref >60)
GFR, EST NON AFRICAN AMERICAN: 32 mL/min — AB (ref >60)
GLUCOSE: 190 mg/dL — AB (ref 65–99)
Potassium: 4.5 mmol/L (ref 3.5–5.1)
Sodium: 136 mmol/L (ref 135–145)
Total Bilirubin: 0.4 mg/dL (ref 0.3–1.2)
Total Protein: 6 g/dL — ABNORMAL LOW (ref 6.5–8.1)

## 2016-04-17 LAB — LIPASE, BLOOD: LIPASE: 19 U/L (ref 11–51)

## 2016-04-17 LAB — CBC
HCT: 27.1 % — ABNORMAL LOW (ref 36.0–46.0)
Hemoglobin: 8.2 g/dL — ABNORMAL LOW (ref 12.0–15.0)
MCH: 25.9 pg — AB (ref 26.0–34.0)
MCHC: 30.3 g/dL (ref 30.0–36.0)
MCV: 85.5 fL (ref 78.0–100.0)
PLATELETS: 273 10*3/uL (ref 150–400)
RBC: 3.17 MIL/uL — ABNORMAL LOW (ref 3.87–5.11)
RDW: 15.8 % — AB (ref 11.5–15.5)
WBC: 27.4 10*3/uL — AB (ref 4.0–10.5)

## 2016-04-17 LAB — CBG MONITORING, ED: Glucose-Capillary: 192 mg/dL — ABNORMAL HIGH (ref 65–99)

## 2016-04-17 MED ORDER — ONDANSETRON HCL 4 MG/2ML IJ SOLN: 4.0000 mg | Freq: Once | INTRAMUSCULAR | Status: DC | PRN

## 2016-04-17 MED ORDER — INSULIN ASPART 100 UNIT/ML ~~LOC~~ SOLN: 0.0000 [IU] | Freq: Three times a day (TID) | SUBCUTANEOUS | Status: DC

## 2016-04-17 MED ORDER — CARVEDILOL 12.5 MG PO TABS
6.2500 mg | ORAL_TABLET | Freq: Two times a day (BID) | ORAL | Status: DC
  Administered 2016-04-18 – 2016-04-19 (×4): 6.25 mg via ORAL
  Filled 2016-04-17 (×4): qty 1

## 2016-04-17 MED ORDER — IPRATROPIUM-ALBUTEROL 0.5-2.5 (3) MG/3ML IN SOLN: 3.0000 mL | RESPIRATORY_TRACT | Status: DC | PRN

## 2016-04-17 MED ORDER — FAMOTIDINE IN NACL 20-0.9 MG/50ML-% IV SOLN
20.0000 mg | Freq: Once | INTRAVENOUS | Status: AC
  Administered 2016-04-17: 20 mg via INTRAVENOUS
  Filled 2016-04-17: qty 50

## 2016-04-17 MED ORDER — ACETAMINOPHEN 325 MG PO TABS
650.0000 mg | ORAL_TABLET | Freq: Once | ORAL | Status: AC
  Administered 2016-04-17: 650 mg via ORAL
  Filled 2016-04-17: qty 2

## 2016-04-17 MED ORDER — HEPARIN SODIUM (PORCINE) 5000 UNIT/ML IJ SOLN
5000.0000 [IU] | Freq: Three times a day (TID) | INTRAMUSCULAR | Status: DC
  Administered 2016-04-18 (×2): 5000 [IU] via SUBCUTANEOUS
  Filled 2016-04-17: qty 1

## 2016-04-17 MED ORDER — SODIUM CHLORIDE 0.9 % IV SOLN
Freq: Once | INTRAVENOUS | Status: AC
  Administered 2016-04-18: 01:00:00 via INTRAVENOUS

## 2016-04-17 MED ORDER — ACETAMINOPHEN 650 MG RE SUPP: 650.0000 mg | Freq: Four times a day (QID) | RECTAL | Status: DC | PRN

## 2016-04-17 MED ORDER — ASPIRIN EC 81 MG PO TBEC
81.0000 mg | DELAYED_RELEASE_TABLET | Freq: Every day | ORAL | Status: DC
  Administered 2016-04-18 – 2016-04-19 (×2): 81 mg via ORAL
  Filled 2016-04-17 (×2): qty 1

## 2016-04-17 MED ORDER — IOPAMIDOL (ISOVUE-300) INJECTION 61%
INTRAVENOUS | Status: AC
  Filled 2016-04-17: qty 30

## 2016-04-17 MED ORDER — ALBUTEROL SULFATE (2.5 MG/3ML) 0.083% IN NEBU: 2.5000 mg | INHALATION_SOLUTION | RESPIRATORY_TRACT | Status: DC | PRN

## 2016-04-17 MED ORDER — ATORVASTATIN CALCIUM 80 MG PO TABS
80.0000 mg | ORAL_TABLET | Freq: Every day | ORAL | Status: DC
  Administered 2016-04-18 – 2016-04-19 (×2): 80 mg via ORAL
  Filled 2016-04-17 (×2): qty 1

## 2016-04-17 MED ORDER — SODIUM CHLORIDE 0.9 % IV SOLN
Freq: Once | INTRAVENOUS | Status: AC
  Administered 2016-04-17: 20:00:00 via INTRAVENOUS

## 2016-04-17 MED ORDER — ACETAMINOPHEN 325 MG PO TABS: 650.0000 mg | ORAL_TABLET | Freq: Four times a day (QID) | ORAL | Status: DC | PRN

## 2016-04-17 NOTE — H&P (Signed)
[facility] Admission History and Physical Service Pager: [phone]  Patient name: Natalie Porter Medical record number: [id number] Date of birth: 05/30/1940 Age: 76 y.o. Gender: female  Primary Care Provider: Everrett Coombe, MD Consultants: none Code Status: partial (DNI)  Chief Complaint: diarrhea and fever  Assessment and Plan: Kailana Benninger is a 76 y.o. female presenting with cough and shortness of breath. PMH is significant for HFpEF, T2DM, stage 3 CKD, COPD, GERD, PVD, CAD, HLD, OSA, anemia, lung CA, osteoarthritis.   Nausea/vomiting/diarrhea/abdominal pain: concerning for C. difficile  given her recent hospitalization. Patient with fever to 101.3 at home and leukocytosis to 27.3. Exam remarkable for diffuse abdominal tenderness and rebound tenderness over right lower quadrant on my exam concerning for appendicitis. She is also dizzy when she sat up on bed for exam. She didn't look toxic to think of peritonitis. No transaminitis or alkaline phosphatase to think of gallbladder etiology. Abdomen felt soft to think of small bowel obstruction. Cannot rule out diverticulitis although this is usually more over left lower quadrants. Doubt pancreatitis with lipase to 19. No suprapubic or CVA tenderness to think of UTI or pyelo. Urinalysis was also negative. Patient reports feeling better since she was here.  -Admit to [place] [facility] for observation. Attending Dr. Gwendlyn Deutscher -Gentle IV fluid at 58 m/h while nothing by mouth -CT abdomen and pelvis for possible appendicitis -Stool for C. difficile ordered in ED -PT/INR -Monitor intake and output -Orthostatic vitals -PT eval  Diastolic CHF, stable. Patient without dyspnea, edema or chest pain. Euvolemic on exam. Cardiopulmonary exam normal except for SEM, LUSB>RUSB and left carotid bruits. Last echo May 2015 EF 60-65% aortic valve thickened and calcified c/w sclerosis and mild regurg, mild LAE. However, weight up by  10 from last admission.  - Gentle IV fluid for dehydration as 75 ml/hr for the next 8 hours - Check BNP and CXR - EKG and trop once - Repeat ECHO  - Hold home Lasix in setting of dehydration  CKD Stage 3: baseline creatinine ~1.4, 1.52 on admission. -BMP in the morning - hold home valsartan and Lasix in the setting of nausea vomiting and diarrhea - monitor BMP  Adenocarcinoma, Lung: Left lung nodule stable, biopsied to be adenocarcinoma. States that she will have a CT scan of the chest next month - Continue to follow.  - Outpatient workup  CAD - Cath 2015, 50% calcified LAD, 60% circumflex stenosis, right coronary artery 70% stenosis medical management. On carvedilol 25 mg twice a day, atorvastatin and aspirin - Continue home statin and aspirin - Continue carvedilol at 6.25 mg twice a day  COPD-likely stage D. Stable: Patient without respiratory symptoms. Lung exam within normal limits. On DuoNeb and Spiriva at home -  Continue home Spiriva -  DuoNeb every 4 hours as needed -  Oxygen as needed to keep saturation greater than 90%  HTN: Normotensive. Concern for orthostatic hypotension in the setting of dehydration. On norvasc '10mg'$  qd, coreg '25mg'$  bid. Valsartan discontinued last hospitalization due to history of present illness and hypokalemia -Continue Coreg at 6.25 mg twice a day given bradycardia -Hold other medications -Orthostatic vitals  T2DM on lantus 20U at home. Hasn't used in the last 2 days. Last A1c 7.8 on 02/06/2016. CBG 192 on arrival. Bicarbonate 17. No Anion gap - Resume Lantus in the morning - SSI-thin - monitor CBGs  Anemia of chronic disease.  Hgb 8.2 (at baseline) - monitor CBC  PVD, stable. Has h/o fem-fem bypass graft in  2015 and is s/p iliac artery stent. - Continue home gabapentin  HLD: Last lipid panel on 11/11/2015. LDL was 71 - Continue home atorvastatin '80mg'$  qd   GERD - continue home ppi   OSA: On CPAP at home - CPAP at  night  H/o depression: Stable - Continue home lexapro  FEN/GI:  -Nothing by mouth except sips with meds pending abdominal CT -IV fluid as above  Prophylaxis: Subcutaneous heparin pending CT abdomen.  Disposition: Admit to [facility] for observation and management of GI symptoms and dehydration.   History of Present Illness:  Natalie Porter is a 76 y.o. female presenting with nausea, diarrhea and weakness  She states not feeling good for "long time". Reports poor appetite for one week. She states that nothing tasted good.  Has watery diarrhea for the last 2-3 days. She reports feeling bad when she woke up this morning. She stated that her daughter called her doctor who recommended going to urgent care or emergency department. Accordingly, she went to [place] urgent care and was told that something is wrong about her diabetes and she was sent to ED. Patient reports nausea, heaving and diarrhea before she got to urgent care. . She states that she felt like a wet noodle. Daughter reports a fever to 101.65F yesterday. Patient reports diffuse abdominal pain that she describes as sore. She denies radiation to the back or shoulder. She also had one episode of emesis yesterday. Emesis and diarrhea are nonbloody.   Patient denies headache, vision changes, runny nose, congestion, sore throat, shortness of breath, chest pain, dysuria or increased frequency of urination or skin rash. She admits nausea, vomiting, diarrhea, dizziness (lightheadedness), abdominal pain and fever.   Off note patient has history of constipation.  Patient's daughter reports giving her Metamucil yesterday. Patient was recently hospitalized from 04/01/2016-04/04/2015 for COPD exacerbation and anemia. She was discharged on azithromycin and steroid. 3 days.   In ED, vital signs significant for bradycardia to 58. Labs remarkable for bicarbonate of 17, glucose to 190, creatinine to 1.52, lipase 19, WBC 27.4, Hgb 8.2, UA negative.  Family medicine was called to admit patient for dehydration and possible C. Difficile.   Review Of Systems:  ROS Review of systems negative except for pertinent positives and negatives in history of present illness above.   Patient Active Problem List   Diagnosis Date Noted  . Diarrhea 04/17/2016  . Mixed simple and mucopurulent chronic bronchitis ([place])   . Atypical pneumonia   . Elevated troponin   . S/P insertion of iliac artery stent 02/27/2016  . History of arterial bypass of lower extremity 02/27/2016  . Acute upper respiratory infection 01/12/2016  . Hyperkalemia 10/27/2015  . Constipation 10/09/2015  . Primary cancer of left upper lobe of lung ([place]) 10/09/2015  . UTI (urinary tract infection) 10/09/2015  . HCAP (healthcare-associated pneumonia) 09/27/2015  . SOB (shortness of breath) 09/27/2015  . OSA on CPAP 07/08/2015  . CAD in native artery 07/08/2015  . Primary cancer of right lower lobe of lung ([place]) 03/07/2015  . COPD exacerbation ([place]) 12/11/2014  . Peripheral vascular disease, unspecified 11/23/2013  . Nonhealing nonsurgical wound 08/17/2013  . Incisional infection 08/11/2013  . CKD (chronic kidney disease) stage 3, GFR 30-59 ml/min 08/09/2013  . Cardiorenal syndrome 08/02/2013  . Diastolic CHF, acute on chronic (HCC) 07/30/2013  . Anemia 07/30/2013  . Hypertension 07/30/2013  . HLD (hyperlipidemia) 07/30/2013  . PAD (peripheral artery disease) ([place]) 07/21/2013  . Chronic kidney disease (CKD), stage III (  moderate) 06/17/2013  . Microalbuminuric diabetic nephropathy ([place]) 06/17/2013  . Leg pain 06/15/2013  . Long term current use of anticoagulant 12/20/2012  . Elevated erythrocyte sedimentation rate 10/20/2012  . Acute DVT (deep venous thrombosis) ([place]) 10/10/2012  . Carotid stenosis 07/27/2012  . Clinical depression 07/27/2012  . Diabetes mellitus, type 2 ([place]) 07/27/2012  . Difficulty hearing 07/27/2012  . Adult BMI 30+ 06/21/2012  . Adiposity 06/21/2012   . History of deep venous thrombosis 12/24/2011  . Vascular disorder of lower extremity 12/24/2011  . COPD (chronic obstructive pulmonary disease) ([place]) 09/15/2011  . Lung nodule, multiple 09/15/2011  . Biliary calculi 08/04/2011  . Allergic rhinitis 07/24/2011  . Obstructive apnea 07/24/2011  . Arthritis, degenerative 07/24/2011  . Ex-cigarette smoker 07/23/2011  . Personal history of nicotine dependence 07/23/2011  . 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 Anemia   . Arthritis   . CAD (coronary artery disease)    Cath 2015, 50% calcified LAD, 60% circumflex stenosis, right coronary artery 70% stenosis medical management  . CHF (congestive heart failure) (HCC)    Preserved EF.    Marland Kitchen COPD (chronic obstructive pulmonary disease) ([place])   . Depression   . Diabetes mellitus without complication ([place])    INSULIN DEPENDENT  . DVT (deep venous thrombosis) ([place])    2011  . Gallstone   . GERD (gastroesophageal reflux disease)   . History of blood transfusion 03/2016  . Hx: UTI (urinary tract infection)   . Hypercholesteremia   . Hypertension   . Iron deficiency anemia   . Lung cancer [facility])    New diagnosis (treated at [facility]).  Going to receive radiation  . Neuropathy ([place])   . Osteoarthritis   . Pneumonia 2014  . PVD (peripheral vascular disease) ([place])   . Radiation 03/26/15, 03/28/15, 04/02/15   right lower lobe nodule SBRT 54 gray  . Renal disorder    stage 3  . SBO (small bowel obstruction) 08/2015  . Sleep apnea    CPAP  . TIA (transient ischemic attack)    PER PATIENT  . Vitamin B12 deficiency     Past Surgical History: Past Surgical History:  Procedure Laterality Date  . ABDOMINAL AORTAGRAM N/A 07/25/2013   Procedure: ABDOMINAL Maxcine Ham;  Surgeon: Serafina Mitchell, MD;  Location: [facility] CATH LAB;  Service: Cardiovascular;  Laterality: N/A;  . APPLICATION OF WOUND VAC Bilateral 08/07/2013    Procedure: APPLICATION OF INCISIONAL WOUND VAC;  Surgeon: Elam Dutch, MD;  Location: [facility];  Service: Vascular;  Laterality: Bilateral;  . BREAST BIOPSY Left   . CAROTID ENDARTERECTOMY Right 1990  . CARPAL TUNNEL RELEASE Bilateral   . COLONOSCOPY W/ POLYPECTOMY    . ENDARTERECTOMY FEMORAL Left 08/07/2013   Procedure: ENDARTERECTOMY FEMORAL;  Surgeon: Elam Dutch, MD;  Location: [facility];  Service: Vascular;  Laterality: Left;  . EYE SURGERY Bilateral    cataract  . FEMORAL-FEMORAL BYPASS GRAFT  08/07/2013   Procedure: BYPASS GRAFT FEMORAL-FEMORAL ARTERY USING 8MM X 30CM HEMASHIELD GRAFT; LEFT ILIAC ANGIOGRAM; ATTEMPTED LEFT ILIAC ARTERY STENT GRAFT;  Surgeon: Elam Dutch, MD;  Location: [facility];  Service: Vascular;;  . FOOT SURGERY Bilateral    bone spurs, and repair  . LEFT HEART CATHETERIZATION WITH CORONARY ANGIOGRAM N/A 07/27/2013   Procedure: LEFT HEART CATHETERIZATION WITH CORONARY ANGIOGRAM;  Surgeon: Minus Breeding, MD;  Location: [facility] CATH LAB;  Service: Cardiovascular;  Laterality: N/A;  .  LUNG BIOPSY     non cancerous  . TUBAL LIGATION      Social History: Social History  Substance Use Topics  . Smoking status: Former Smoker    Packs/day: 1.00    Years: 34.00    Types: Cigarettes    Quit date: 03/28/1988  . Smokeless tobacco: Never Used  . Alcohol use No   Additional social history: as above  Please also refer to relevant sections of EMR.  Family History: Family History  Problem Relation Age of Onset  . Heart attack Mother 64  . Stroke Father   . Heart attack Father 62  . Cancer Sister 57    Uterian  . Cancer Sister     Lung   (If not completed, MUST add something in)  Allergies and Medications: Allergies  Allergen Reactions  . Codeine Nausea Only    Can tolerate in lower doses  . Morphine And Related Nausea And Vomiting    Can tolerate in lower doses   No current facility-administered medications on file prior to encounter.    Current Outpatient  Prescriptions on File Prior to Encounter  Medication Sig Dispense Refill  . albuterol (PROVENTIL HFA;VENTOLIN HFA) 108 (90 Base) MCG/ACT inhaler Inhale 2 puffs into the lungs every 4 (four) hours as needed for wheezing or shortness of breath. 1 Inhaler 1  . amLODipine (NORVASC) 10 MG tablet Take 1 tablet (10 mg total) by mouth daily. 90 tablet 3  . aspirin EC 81 MG tablet Take 81 mg by mouth daily.    Marland Kitchen atorvastatin (LIPITOR) 80 MG tablet Take 1 tablet (80 mg total) by mouth daily. 90 tablet 1  . bisacodyl (DULCOLAX) 10 MG suppository Place 1 suppository (10 mg total) rectally as needed for moderate constipation. 6 suppository 0  . Calcium Carbonate (CALCIUM-CARB 600 PO) Take 600 mg by mouth 2 (two) times daily.    . carvedilol (COREG) 25 MG tablet Take 1 tablet (25 mg total) by mouth 2 (two) times daily with a meal. 60 tablet 3  . escitalopram (LEXAPRO) 20 MG tablet Take 1 tablet (20 mg total) by mouth daily. 30 tablet 0  . fluticasone (FLONASE) 50 MCG/ACT nasal spray Place 2 sprays into both nostrils daily. (Patient taking differently: Place 2 sprays into both nostrils daily as needed for allergies or rhinitis. ) 16 g 3  . furosemide (LASIX) 80 MG tablet Take 1 tablet (80 mg total) by mouth daily. 90 tablet 3  . gabapentin (NEURONTIN) 100 MG capsule Take 2-3 capsules (200-300 mg total) by mouth 2 (two) times daily. 200 mg in the morning and 300 mg at bedtime (Patient taking differently: Take 200-300 mg by mouth See admin instructions. 200 mg in the morning and 300 mg at bedtime) 120 capsule 3  . insulin glargine (LANTUS) 100 UNIT/ML injection Inject 0.2 mLs (20 Units total) into the skin at bedtime. 10 mL 2  . ipratropium-albuterol (DUONEB) 0.5-2.5 (3) MG/3ML SOLN Take 3 mLs by nebulization every 4 (four) hours as needed. (Patient taking differently: Take 3 mLs by nebulization 5 (five) times daily. ) 360 mL 0  . omeprazole (PRILOSEC) 20 MG capsule Take 1 capsule (20 mg total) by mouth daily. 30  capsule 2  . tiotropium (SPIRIVA) 18 MCG inhalation capsule Place 1 capsule (18 mcg total) into inhaler and inhale daily. 30 capsule 3  . acetaminophen-codeine (TYLENOL #2) 300-15 MG tablet Take 1 tablet by mouth every 4 (four) hours as needed for moderate pain. (Patient not taking: Reported  on 04/01/2016) 30 tablet 0  . glucose blood test strip Use as instructed 100 each 12  . polyethylene glycol powder (GLYCOLAX/MIRALAX) powder Take 17 g by mouth 3 (three) times daily as needed for moderate constipation. (Patient not taking: Reported on 04/01/2016) 500 g 0    Objective: BP (!) 140/39   Pulse (!) 58   Temp 98.7 F (37.1 C) (Oral)   Resp 22   Ht '5\' 6"'$  (1.676 m)   Wt 90.7 kg (200 lb)   SpO2 97%   BMI 32.28 kg/m  Exam: GEN: appears well, lying in bed, no apparent distress. Head: normocephalic and atraumatic  Eyes: conjunctiva without injection, sclera anicteric Oropharynx: mmm without erythema or exudation. Wears denture HEM: negative for cervical or periauricular lymphadenopathies CVS: RRR, nl s1 & s2, 2/6 SEM LUSB>RUSB, carotid bruits left > right, murmurs, no edema, cap refills < 2 secs RESP: speaks in full sentence, no IWOB, good air movement bilaterally, CTAB GI: BS present & normal, soft, diffusely tender to palpation, rebound tenderness over RLQ GU: no suprapubic or CVA tenderness MSK: no focal tenderness or notable swelling SKIN: no apparent skin lesion NEURO: alert and oiented appropriately, no gross defecits  PSYCH: euthymic mood with congruent affect  Labs and Imaging: CBC BMET   Recent Labs Lab 04/17/16 1916  WBC 27.4*  HGB 8.2*  HCT 27.1*  PLT 273    Recent Labs Lab 04/17/16 1916  NA 136  K 4.5  CL 111  CO2 17*  BUN 41*  CREATININE 1.52*  GLUCOSE 190*  CALCIUM 7.6*     No results found.  Mercy Riding, MD 04/17/2016, 11:32 PM PGY-2, [place] [facility] pager: [phone], text pages welcome

## 2016-04-17 NOTE — ED Notes (Signed)
Pt given oral contrast.

## 2016-04-17 NOTE — ED Triage Notes (Signed)
Pt arrives awake and alert no complaints

## 2016-04-17 NOTE — Telephone Encounter (Signed)
Daughter was told to call if she hadn't heard anything by 1:30pm. Please call back [phone]. ep

## 2016-04-17 NOTE — ED Notes (Signed)
Patient stated she became sick to her stomach upon standing.  Patient requested to sit back in the bed. I only completed vital signs while patient was laying down and sitting.

## 2016-04-17 NOTE — Telephone Encounter (Signed)
Spoke with patient's daughter Horris Latino.  Patient has been having diarrhea, fevers, and seems intermittently confused. I advised she goes to either urgent care or the emergency room to be evaluated. There are no openings available in our clinic.  Given the patients numerous comorbidities and recent hospitalization I do not think it is advisable to wait the weekend to be seen on Monday.  Patient and her daughter agreed to this plan over the phone and plan to go to Urgent care in [place] to be seen today.  Everrett Coombe, MD

## 2016-04-17 NOTE — ED Notes (Signed)
Pt sent from urgent care in [place] for DKA-- states urine and blood work was abnormal. Received 1 L fluids enroute -- 18G IV in right AC, saline locked.  A/O x 4,

## 2016-04-17 NOTE — ED Notes (Signed)
Pt updated on plan of care

## 2016-04-17 NOTE — Telephone Encounter (Signed)
pts blood sugar this morning is 156 wiithout insulin last night.  Daughter thinks she has UTI again.  She is running a fever. 101.9 yesterday.  99.2 after giving tylenol. Could antibotic be called in?  She has diarrhea, fever and talking in her sleep. Please advise

## 2016-04-17 NOTE — ED Provider Notes (Addendum)
[facility] DEPT Provider Note   CSN: [id number] Arrival date & time: 04/17/16  1758     History   Chief Complaint Chief Complaint  Patient presents with  . Abnormal Lab    Pt sent in for elevated sugar from the urgent care after being evaluated for dehydration r/t diahrrea      HPI Dane Yalexa Blust is a 76 y.o. female.  HPI Patient reports she has had diarrhea for 3 days. Approximate 4 or more liquid stool per day. Today she has developed vomiting and dry heaves. Yesterday she had very poor appetite and ate very little. Family member measured maximum temperature to 100. Patient reports she has had chills and sweats. She reports some general central diffuse abdominal discomfort cramping in nature. Patient denies chest pain, cough or sputum production. She was seen at urgent care in [place] today. They identified low blood pressure and ketones in her urine and had concern for a dehydration or DKA. She was transported to the emergency department via EMS. She was given a liter of fluids. Patient reports that she does feel improved after the fluids. She's felt very weak and fatigued. Patient did have hospitalization at the beginning of January. Has had several courses of antibiotics.  Past Medical History:  Diagnosis Date  . Anemia   . Arthritis   . CAD (coronary artery disease)    Cath 2015, 50% calcified LAD, 60% circumflex stenosis, right coronary artery 70% stenosis medical management  . CHF (congestive heart failure) (HCC)    Preserved EF.    Marland Kitchen COPD (chronic obstructive pulmonary disease) ([place])   . Depression   . Diabetes mellitus without complication ([place])    INSULIN DEPENDENT  . DVT (deep venous thrombosis) ([place])    2011  . Gallstone   . GERD (gastroesophageal reflux disease)   . History of blood transfusion 03/2016  . Hx: UTI (urinary tract infection)   . Hypercholesteremia   . Hypertension   . Iron deficiency anemia   . Lung cancer [facility])    New diagnosis  (treated at [facility]).  Going to receive radiation  . Neuropathy ([place])   . Osteoarthritis   . Pneumonia 2014  . PVD (peripheral vascular disease) ([place])   . Radiation 03/26/15, 03/28/15, 04/02/15   right lower lobe nodule SBRT 54 gray  . Renal disorder    stage 3  . SBO (small bowel obstruction) 08/2015  . Sleep apnea    CPAP  . TIA (transient ischemic attack)    PER PATIENT  . Vitamin B12 deficiency     Patient Active Problem List   Diagnosis Date Noted  . Diarrhea 04/17/2016  . Mixed simple and mucopurulent chronic bronchitis ([place])   . Atypical pneumonia   . Elevated troponin   . S/P insertion of iliac artery stent 02/27/2016  . History of arterial bypass of lower extremity 02/27/2016  . Acute upper respiratory infection 01/12/2016  . Hyperkalemia 10/27/2015  . Constipation 10/09/2015  . Primary cancer of left upper lobe of lung ([place]) 10/09/2015  . UTI (urinary tract infection) 10/09/2015  . HCAP (healthcare-associated pneumonia) 09/27/2015  . SOB (shortness of breath) 09/27/2015  . OSA on CPAP 07/08/2015  . CAD in native artery 07/08/2015  . Primary cancer of right lower lobe of lung ([place]) 03/07/2015  . COPD exacerbation ([place]) 12/11/2014  . Peripheral vascular disease, unspecified 11/23/2013  . Nonhealing nonsurgical wound 08/17/2013  . Incisional infection 08/11/2013  . CKD (chronic kidney disease) stage 3, GFR 30-59 ml/min  08/09/2013  . Cardiorenal syndrome 08/02/2013  . Diastolic CHF, acute on chronic (HCC) 07/30/2013  . Anemia 07/30/2013  . Hypertension 07/30/2013  . HLD (hyperlipidemia) 07/30/2013  . PAD (peripheral artery disease) ([place]) 07/21/2013  . Chronic kidney disease (CKD), stage III (moderate) 06/17/2013  . Microalbuminuric diabetic nephropathy ([place]) 06/17/2013  . Leg pain 06/15/2013  . Long term current use of anticoagulant 12/20/2012  . Elevated erythrocyte sedimentation rate 10/20/2012  . Acute DVT (deep venous thrombosis) ([place]) 10/10/2012  . Carotid  stenosis 07/27/2012  . Clinical depression 07/27/2012  . Diabetes mellitus, type 2 ([place]) 07/27/2012  . Difficulty hearing 07/27/2012  . Adult BMI 30+ 06/21/2012  . Adiposity 06/21/2012  . History of deep venous thrombosis 12/24/2011  . Vascular disorder of lower extremity 12/24/2011  . COPD (chronic obstructive pulmonary disease) ([place]) 09/15/2011  . Lung nodule, multiple 09/15/2011  . Biliary calculi 08/04/2011  . Allergic rhinitis 07/24/2011  . Obstructive apnea 07/24/2011  . Arthritis, degenerative 07/24/2011  . Ex-cigarette smoker 07/23/2011  . Personal history of nicotine dependence 07/23/2011  . 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 ABDOMINAL AORTAGRAM N/A 07/25/2013   Procedure: ABDOMINAL Maxcine Ham;  Surgeon: Serafina Mitchell, MD;  Location[facility] CATH LAB;  Service: Cardiovascular;  Laterality: N/A;  . APPLICATION OF WOUND VAC Bilateral 08/07/2013   Procedure: APPLICATION OF INCISIONAL WOUND VAC;  Surgeon: Elam Dutch, MD;  Location: [facility];  Service: Vascular;  Laterality: Bilateral;  . BREAST BIOPSY Left   . CAROTID ENDARTERECTOMY Right 1990  . CARPAL TUNNEL RELEASE Bilateral   . COLONOSCOPY W/ POLYPECTOMY    . ENDARTERECTOMY FEMORAL Left 08/07/2013   Procedure: ENDARTERECTOMY FEMORAL;  Surgeon: Elam Dutch, MD;  Location: [facility];  Service: Vascular;  Laterality: Left;  . EYE SURGERY Bilateral    cataract  . FEMORAL-FEMORAL BYPASS GRAFT  08/07/2013   Procedure: BYPASS GRAFT FEMORAL-FEMORAL ARTERY USING 8MM X 30CM HEMASHIELD GRAFT; LEFT ILIAC ANGIOGRAM; ATTEMPTED LEFT ILIAC ARTERY STENT GRAFT;  Surgeon: Elam Dutch, MD;  Location: [facility];  Service: Vascular;;  . FOOT SURGERY Bilateral    bone spurs, and repair  . LEFT HEART CATHETERIZATION WITH CORONARY ANGIOGRAM N/A 07/27/2013   Procedure: LEFT HEART CATHETERIZATION WITH CORONARY ANGIOGRAM;  Surgeon: Minus Breeding, MD;  Location: [facility] CATH LAB;   Service: Cardiovascular;  Laterality: N/A;  . LUNG BIOPSY     non cancerous  . TUBAL LIGATION      OB History    No data available       Home Medications    Prior to Admission medications   Medication Sig Start Date End Date Taking? Authorizing Provider  albuterol (PROVENTIL HFA;VENTOLIN HFA) 108 (90 Base) MCG/ACT inhaler Inhale 2 puffs into the lungs every 4 (four) hours as needed for wheezing or shortness of breath. 02/18/16  Yes Everrett Coombe, MD  amLODipine (NORVASC) 10 MG tablet Take 1 tablet (10 mg total) by mouth daily. 02/18/16  Yes Everrett Coombe, MD  aspirin EC 81 MG tablet Take 81 mg by mouth daily.   Yes Historical Provider, MD  atorvastatin (LIPITOR) 80 MG tablet Take 1 tablet (80 mg total) by mouth daily. 02/18/16  Yes Everrett Coombe, MD  bisacodyl (DULCOLAX) 10 MG suppository Place 1 suppository (10 mg total) rectally as needed for moderate constipation. 03/13/16  Yes Archie Patten, MD  Calcium Carbonate (CALCIUM-CARB 600 PO) Take 600 mg by mouth 2 (two) times daily.  Yes Historical Provider, MD  carvedilol (COREG) 25 MG tablet Take 1 tablet (25 mg total) by mouth 2 (two) times daily with a meal. 02/18/16  Yes Everrett Coombe, MD  escitalopram (LEXAPRO) 20 MG tablet Take 1 tablet (20 mg total) by mouth daily. 04/16/16  Yes Everrett Coombe, MD  fluticasone (FLONASE) 50 MCG/ACT nasal spray Place 2 sprays into both nostrils daily. Patient taking differently: Place 2 sprays into both nostrils daily as needed for allergies or rhinitis.  02/18/16  Yes Everrett Coombe, MD  furosemide (LASIX) 80 MG tablet Take 1 tablet (80 mg total) by mouth daily. 02/18/16  Yes Everrett Coombe, MD  gabapentin (NEURONTIN) 100 MG capsule Take 2-3 capsules (200-300 mg total) by mouth 2 (two) times daily. 200 mg in the morning and 300 mg at bedtime Patient taking differently: Take 200-300 mg by mouth See admin instructions. 200 mg in the morning and 300 mg at bedtime 02/18/16  Yes Everrett Coombe, MD  insulin glargine  (LANTUS) 100 UNIT/ML injection Inject 0.2 mLs (20 Units total) into the skin at bedtime. 04/03/16  Yes Elberta Leatherwood, MD  ipratropium-albuterol (DUONEB) 0.5-2.5 (3) MG/3ML SOLN Take 3 mLs by nebulization every 4 (four) hours as needed. Patient taking differently: Take 3 mLs by nebulization 5 (five) times daily.  02/18/16  Yes Everrett Coombe, MD  omeprazole (PRILOSEC) 20 MG capsule Take 1 capsule (20 mg total) by mouth daily. 02/18/16  Yes Everrett Coombe, MD  tiotropium (SPIRIVA) 18 MCG inhalation capsule Place 1 capsule (18 mcg total) into inhaler and inhale daily. 02/18/16  Yes Everrett Coombe, MD  Vitamin D, Ergocalciferol, (DRISDOL) 50000 units CAPS capsule Take 50,000 Units by mouth every 7 (seven) days. Take on fridays   Yes Historical Provider, MD  acetaminophen-codeine (TYLENOL #2) 300-15 MG tablet Take 1 tablet by mouth every 4 (four) hours as needed for moderate pain. Patient not taking: Reported on 04/01/2016 11/08/15   Melvina N Rumley, DO  azithromycin (ZITHROMAX) 250 MG tablet Take 1 tablet (250 mg total) by mouth daily. Patient not taking: Reported on 04/17/2016 04/04/16   Elberta Leatherwood, MD  glucose blood test strip Use as instructed 02/18/16   Everrett Coombe, MD  polyethylene glycol powder (GLYCOLAX/MIRALAX) powder Take 17 g by mouth 3 (three) times daily as needed for moderate constipation. Patient not taking: Reported on 04/01/2016 03/13/16   Archie Patten, MD  predniSONE (DELTASONE) 50 MG tablet Take 1 tablet (50 mg total) by mouth daily with breakfast. Patient not taking: Reported on 04/17/2016 04/03/16   Elberta Leatherwood, MD    Family History Family History  Problem Relation Age of Onset  . Heart attack Mother 34  . Stroke Father   . Heart attack Father 59  . Cancer Sister 3    Uterian  . Cancer Sister     Lung    Social History Social History  Substance Use Topics  . Smoking status: Former Smoker    Packs/day: 1.00    Years: 34.00    Types: Cigarettes    Quit date: 03/28/1988  .  Smokeless tobacco: Never Used  . Alcohol use No     Allergies   Codeine and Morphine and related   Review of Systems Review of Systems 10 Systems reviewed and are negative for acute change except as noted in the HPI.   Physical Exam Updated Vital Signs BP (!) 140/39   Pulse (!) 58   Temp 98.7 F (37.1 C) (Oral)   Resp 22  Ht '5\' 6"'$  (1.676 m)   Wt 200 lb (90.7 kg)   SpO2 97%   BMI 32.28 kg/m   Physical Exam  Constitutional: She is oriented to person, place, and time.  Patient is deconditioned and has central obesity. She does appear slightly fatigued and pale but has normal mental status and no respiratory distress at rest.  HENT:  Head: Normocephalic and atraumatic.  Mouth/Throat: Oropharynx is clear and moist.  Eyes: EOM are normal. Pupils are equal, round, and reactive to light.  Neck: Neck supple.  Cardiovascular:  2\6 systolic ejection murmur. Regular.  Pulmonary/Chest: Effort normal and breath sounds normal.  Abdominal:  Abdomen is soft. Mild central abdominal discomfort of palpation. No guarding or rebound. Lower abdomen is nontender to deep palpation.  Musculoskeletal: Normal range of motion. She exhibits no edema, tenderness or deformity.  Neurological: She is alert and oriented to person, place, and time. She exhibits normal muscle tone. Coordination normal.  Skin: Skin is warm and dry. No pallor.  Psychiatric: She has a normal mood and affect.     ED Treatments / Results  Labs (all labs ordered are listed, but only abnormal results are displayed) Labs Reviewed  COMPREHENSIVE METABOLIC PANEL - Abnormal; Notable for the following:       Result Value   CO2 17 (*)    Glucose, Bld 190 (*)    BUN 41 (*)    Creatinine, Ser 1.52 (*)    Calcium 7.6 (*)    Total Protein 6.0 (*)    Albumin 1.6 (*)    AST 13 (*)    GFR calc non Af Amer 32 (*)    GFR calc Af Amer 37 (*)    All other components within normal limits  CBC - Abnormal; Notable for the  following:    WBC 27.4 (*)    RBC 3.17 (*)    Hemoglobin 8.2 (*)    HCT 27.1 (*)    MCH 25.9 (*)    RDW 15.8 (*)    All other components within normal limits  CBG MONITORING, ED - Abnormal; Notable for the following:    Glucose-Capillary 192 (*)    All other components within normal limits  C DIFFICILE QUICK SCREEN W PCR REFLEX  GASTROINTESTINAL PANEL BY PCR, STOOL (REPLACES STOOL CULTURE)  LIPASE, BLOOD  URINALYSIS, ROUTINE W REFLEX MICROSCOPIC    EKG  EKG Interpretation None       Radiology No results found.  Procedures Procedures (including critical care time)  Medications Ordered in ED Medications  ondansetron (ZOFRAN) injection 4 mg (not administered)  0.9 %  sodium chloride infusion ( Intravenous New Bag/Given 04/17/16 2021)  famotidine (PEPCID) IVPB 20 mg premix (0 mg Intravenous Stopped 04/17/16 2215)  acetaminophen (TYLENOL) tablet 650 mg (650 mg Oral Given 04/17/16 2145)     Initial Impression / Assessment and Plan / ED Course  I have reviewed the triage vital signs and the nursing notes.  Pertinent labs & imaging results that were available during my care of the patient were reviewed by me and considered in my medical decision making (see chart for details).     Consult: Family practice resident for admission. Final Clinical Impressions(s) / ED Diagnoses   Final diagnoses:  Diarrhea, unspecified type  General weakness  Leukocytosis, unspecified type  Patient presents with symptoms as outlined above. Predominantly she has experienced diarrhea, nausea and poor oral intake, although she has continued to take crackers and small amounts of fluids. Patient was  given a liter of fluids prior to arrival by EMS. She did report feeling significantly improved after the fluids. At this time, her blood pressures have remained stable. She does have leukocytosis which is persistent. Patient reports she feels very bad and doesn't think that she can be home and family  members are very concerned for her condition. Her daughter is concerned that she will go home and die or continue to have diarrhea and be right back at the hospital. They also are very concerned that she hasn't eaten very much in the past 3 days. She reportedly however did have a half a barbecue sandwich last night and some crackers with potted meat today and about a quarter cup of Sprite. When I discussed the possibility of being discharged to home, the patient and her daughter were very derisive of my decision making and felt much more comfortable with being admitted to the hospital and consulting their primary medical team. Family medicine teaching service has been consulted and will evaluate the patient in the emergency department with planned admission for observation and ongoing hydration. Further diagnostic studies if indicated. Hopefully we will obtain a stool specimen for C. difficile study as the patient has had recent antibiotics and hospitalization the patient has not had a bowel movement since in the emergency department.  New Prescriptions New Prescriptions   No medications on file     Charlesetta Shanks, MD 04/17/16 [place], MD 04/17/16 2256

## 2016-04-17 NOTE — Telephone Encounter (Signed)
Called patient's daughter Horris Latino earlier today.  She reported that the patient is feeling feverish, has diarrhea and seems a little confused. Advised her to take the patient to urgent care or the ED. There were no appointments available in our clinic today and given her numerous comorbidities and recent hospitalization, it is best that she is seen and evaluated today rather than waiting through the weekend.  Daughter and patient on the phone are agreeable to this plan and stated they would go to [facility] urgent care today.

## 2016-04-17 NOTE — ED Notes (Signed)
Attempted report x1. 

## 2016-04-18 ENCOUNTER — Inpatient Hospital Stay (HOSPITAL_COMMUNITY): Payer: Medicare Other

## 2016-04-18 DIAGNOSIS — D72829 Elevated white blood cell count, unspecified: Secondary | ICD-10-CM

## 2016-04-18 DIAGNOSIS — R011 Cardiac murmur, unspecified: Secondary | ICD-10-CM

## 2016-04-18 DIAGNOSIS — C3490 Malignant neoplasm of unspecified part of unspecified bronchus or lung: Principal | ICD-10-CM

## 2016-04-18 DIAGNOSIS — Z515 Encounter for palliative care: Secondary | ICD-10-CM

## 2016-04-18 DIAGNOSIS — R531 Weakness: Secondary | ICD-10-CM

## 2016-04-18 DIAGNOSIS — R748 Abnormal levels of other serum enzymes: Secondary | ICD-10-CM

## 2016-04-18 DIAGNOSIS — Z7189 Other specified counseling: Secondary | ICD-10-CM

## 2016-04-18 LAB — CBC
HEMATOCRIT: 23.6 % — AB (ref 36.0–46.0)
HEMOGLOBIN: 7.2 g/dL — AB (ref 12.0–15.0)
MCH: 25.9 pg — ABNORMAL LOW (ref 26.0–34.0)
MCHC: 30.5 g/dL (ref 30.0–36.0)
MCV: 84.9 fL (ref 78.0–100.0)
Platelets: 263 10*3/uL (ref 150–400)
RBC: 2.78 MIL/uL — AB (ref 3.87–5.11)
RDW: 16.1 % — ABNORMAL HIGH (ref 11.5–15.5)
WBC: 24.9 10*3/uL — ABNORMAL HIGH (ref 4.0–10.5)

## 2016-04-18 LAB — GASTROINTESTINAL PANEL BY PCR, STOOL (REPLACES STOOL CULTURE)

## 2016-04-18 LAB — ECHOCARDIOGRAM COMPLETE
HEIGHTINCHES: 62 in
Weight: 3248.7 oz

## 2016-04-18 LAB — BASIC METABOLIC PANEL
ANION GAP: 7 (ref 5–15)
BUN: 37 mg/dL — ABNORMAL HIGH (ref 6–20)
CO2: 17 mmol/L — AB (ref 22–32)
Calcium: 7.2 mg/dL — ABNORMAL LOW (ref 8.9–10.3)
Chloride: 110 mmol/L (ref 101–111)
Creatinine, Ser: 1.36 mg/dL — ABNORMAL HIGH (ref 0.44–1.00)
GFR, EST AFRICAN AMERICAN: 43 mL/min — AB (ref >60)
GFR, EST NON AFRICAN AMERICAN: 37 mL/min — AB (ref >60)
GLUCOSE: 124 mg/dL — AB (ref 65–99)
POTASSIUM: 3.9 mmol/L (ref 3.5–5.1)
Sodium: 134 mmol/L — ABNORMAL LOW (ref 135–145)

## 2016-04-18 LAB — TROPONIN I
TROPONIN I: 0.27 ng/mL — AB (ref <0.03)
Troponin I: 0.23 ng/mL (ref <0.03)
Troponin I: 1.02 ng/mL (ref <0.03)

## 2016-04-18 LAB — PROTIME-INR
INR: 1.3
Prothrombin Time: 16.3 seconds — ABNORMAL HIGH (ref 11.4–15.2)

## 2016-04-18 LAB — GLUCOSE, CAPILLARY
GLUCOSE-CAPILLARY: 114 mg/dL — AB (ref 65–99)
GLUCOSE-CAPILLARY: 147 mg/dL — AB (ref 65–99)
Glucose-Capillary: 105 mg/dL — ABNORMAL HIGH (ref 65–99)
Glucose-Capillary: 114 mg/dL — ABNORMAL HIGH (ref 65–99)
Glucose-Capillary: 111 mg/dL — ABNORMAL HIGH (ref 65–99)

## 2016-04-18 LAB — HEMOGLOBIN AND HEMATOCRIT, BLOOD
HEMATOCRIT: 28.7 % — AB (ref 36.0–46.0)
Hemoglobin: 9.2 g/dL — ABNORMAL LOW (ref 12.0–15.0)

## 2016-04-18 LAB — C DIFFICILE QUICK SCREEN W PCR REFLEX
C DIFFICILE (CDIFF) INTERP: NOT DETECTED
C DIFFICLE (CDIFF) ANTIGEN: NEGATIVE
C Diff toxin: NEGATIVE

## 2016-04-18 LAB — APTT: aPTT: 41 seconds — ABNORMAL HIGH (ref 24–36)

## 2016-04-18 LAB — BRAIN NATRIURETIC PEPTIDE: B Natriuretic Peptide: 413 pg/mL — ABNORMAL HIGH (ref 0.0–100.0)

## 2016-04-18 LAB — PREPARE RBC (CROSSMATCH)

## 2016-04-18 MED ORDER — CHLORHEXIDINE GLUCONATE 0.12 % MT SOLN
15.0000 mL | Freq: Two times a day (BID) | OROMUCOSAL | Status: DC
  Administered 2016-04-18 – 2016-04-19 (×4): 15 mL via OROMUCOSAL
  Filled 2016-04-18 (×3): qty 15

## 2016-04-18 MED ORDER — FUROSEMIDE 10 MG/ML IJ SOLN
20.0000 mg | Freq: Once | INTRAMUSCULAR | Status: AC
  Administered 2016-04-18: 20 mg via INTRAVENOUS
  Filled 2016-04-18: qty 2

## 2016-04-18 MED ORDER — ENOXAPARIN SODIUM 40 MG/0.4ML ~~LOC~~ SOLN
40.0000 mg | SUBCUTANEOUS | Status: DC
Start: 2016-04-18 — End: 2016-04-19
  Administered 2016-04-18: 40 mg via SUBCUTANEOUS
  Filled 2016-04-18: qty 0.4

## 2016-04-18 MED ORDER — ORAL CARE MOUTH RINSE: 15.0000 mL | Freq: Two times a day (BID) | OROMUCOSAL | Status: DC

## 2016-04-18 MED ORDER — ESCITALOPRAM OXALATE 20 MG PO TABS
20.0000 mg | ORAL_TABLET | Freq: Every day | ORAL | Status: DC
  Administered 2016-04-18 – 2016-04-19 (×2): 20 mg via ORAL
  Filled 2016-04-18 (×2): qty 1

## 2016-04-18 MED ORDER — PROMETHAZINE HCL 25 MG/ML IJ SOLN
12.5000 mg | Freq: Four times a day (QID) | INTRAMUSCULAR | Status: DC | PRN
  Filled 2016-04-18: qty 1

## 2016-04-18 MED ORDER — FUROSEMIDE 10 MG/ML IJ SOLN
40.0000 mg | Freq: Once | INTRAMUSCULAR | Status: AC
  Administered 2016-04-18: 40 mg via INTRAVENOUS
  Filled 2016-04-18: qty 4

## 2016-04-18 MED ORDER — SODIUM CHLORIDE 0.9 % IV SOLN: Freq: Once | INTRAVENOUS | Status: DC

## 2016-04-18 MED ORDER — PANTOPRAZOLE SODIUM 40 MG IV SOLR
40.0000 mg | INTRAVENOUS | Status: DC
  Administered 2016-04-18 – 2016-04-19 (×2): 40 mg via INTRAVENOUS
  Filled 2016-04-18 (×2): qty 40

## 2016-04-18 MED ORDER — IPRATROPIUM-ALBUTEROL 0.5-2.5 (3) MG/3ML IN SOLN
3.0000 mL | Freq: Four times a day (QID) | RESPIRATORY_TRACT | Status: DC | PRN
  Administered 2016-04-18 – 2016-04-19 (×2): 3 mL via RESPIRATORY_TRACT
  Filled 2016-04-18 (×2): qty 3

## 2016-04-18 MED ORDER — PROMETHAZINE HCL 25 MG PO TABS
12.5000 mg | ORAL_TABLET | Freq: Four times a day (QID) | ORAL | Status: DC | PRN
  Administered 2016-04-18 – 2016-04-19 (×4): 12.5 mg via ORAL
  Filled 2016-04-18 (×4): qty 1

## 2016-04-18 MED ORDER — TIOTROPIUM BROMIDE MONOHYDRATE 18 MCG IN CAPS
18.0000 ug | ORAL_CAPSULE | Freq: Every day | RESPIRATORY_TRACT | Status: DC
Start: 2016-04-18 — End: 2016-04-19
  Administered 2016-04-18 – 2016-04-19 (×2): 18 ug via RESPIRATORY_TRACT
  Filled 2016-04-18: qty 5

## 2016-04-18 NOTE — Progress Notes (Signed)
CRITICAL LAB VALUE TROPONIN: 0.23 On call notified.  Ermalinda Memos, RN

## 2016-04-18 NOTE — Progress Notes (Signed)
Family Medicine Teaching Service Daily Progress Note Intern Pager: [phone]  Patient name: Natalie Porter Medical record number: [id number] Date of birth: 1940/04/02 Age: 76 y.o. Gender: female  Primary Care Provider: Everrett Coombe, MD Consultants: Oncology, palliative Code Status: DO NOT INTUBATE  Pt Overview and Major Events to Date:  01/26: Admitted with nausea vomiting diarrhea and abdominal pain  Assessment and Plan:  Natalie Porter a 76 y.o.femalepresenting with cough and shortness of breath. PMH is significant for HFpEF, T2DM, stage 3 CKD, COPD, GERD, PVD, CAD, HLD, OSA, anemia, lung CA, osteoarthritis.   Nausea/vomiting/diarrhea/abdominal pain: Likely due to metastatic lung cancer to the abdomen. Possible colitis. C. difficile negative. CT abdomen and pelvis with masses involving stomach/bowel, liver and adrenal gland. See CT abdomen reading for detail. Patient reintrate about being comfortable by focusing on pain management and nausea/vomiting -Stop IV fluid given demand ischemia -Talked to rad/onc, Dr. Gery Pray. He reviewed CT abdomen. He also reviewed her past pathology that showed possible benefit from targeted therapy and suggested calling medical oncologist on call, Dr. Julien Nordmann. The later suggested calling patient's oncologist in [place], Dr. Hinton Rao. Dr. Remi Deter office could consult or refer to [facility] oncology if they want.  Called and talked to Dr. Hinton Rao and gave her Dr. Worthy Flank number. She will call Dr. Julien Nordmann to see the patient.  - Palliative consulted - Monitor intake and output - Phenergan for nausea/vomiting. Concern for QTC prolongation on EKG  Adenocarcinoma, Lung:Status post radiation in January 2017 and August 2017. 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 Patient without dyspnea, edema or chest pain. Euvolemic on exam.  Cardiopulmonary exam normal except for SEM, LUSB>RUSB and left carotid bruits. Last echo May 2015 EF 60-65% aortic valve thickened and calcified c/w sclerosis and mild regurg, mild LAE. However, weight up by 10 from last admission. Chest x-ray with stable cardiomegaly.  BNP elevated to 413 but appears to be baseline. Status post IV Lasix 40 mg once - Stop IV fluids - Trend troponin. Will call cardiology if it continues to rise - Repeat ECHO   CKD Stage 3: Creatinine 1.36. 1.52 on admission. Baseline creatinine ~1.4,  - hold home valsartan and Lasix in the setting of nausea vomiting and diarrhea - monitor BMP  Elevated troponin/hx of CAD - denies chest pain or dyspnea. Cath 2015, 50% calcified LAD, 60% circumflex stenosis, right coronary artery 70% stenosis medical management. On carvedilol 25 mg twice a day, atorvastatin and aspirin - Continue home statin and aspirin - Continue carvedilol at 6.25 mg twice a day  COPD-likely stage D. Stable: Patient without respiratory symptoms. Lung exam within normal limits. On DuoNeb and Spiriva at home - Continue home Spiriva -  DuoNeb every 4 hours as needed -  Oxygen as needed to keep saturation greater than 90%  HTN: Normotensive. Concern for orthostatic hypotension in the setting of dehydration. On norvasc '10mg'$  qd, coreg '25mg'$  bid. Valsartan discontinued last hospitalization due to history of present illness and hypokalemia -Continue Coreg at 6.25 mg twice a day given bradycardia -Hold other medications -Orthostatic vitals  T2DM on lantus 20U at home. Hasn't used in the last 2 days. Last A1c 7.8 on 02/06/2016. CBG 192 on arrival. Bicarbonate 17. No Anion gap - Resume Lantus in the morning - SSI-thin - monitor CBGs  Anemia of chronic disease.  Hgb 7.2 this morning, partly hemodilution. 8.2 on admission. Recent  baseline ~8.5 (at baseline). Goal hemoglobin 8.0 given history of CAD. -Will transfuse 1 unit with IV Lasix - monitor CBC  PVD,  stable. Has h/o fem-fem bypass graft in 2015 and is s/p iliac artery stent. - Continue home gabapentin  HLD: Last lipid panel on 11/11/2015. LDL was 71 - Continue home atorvastatin '80mg'$  qd   GERD - continue home ppi   OSA: On CPAP at home - CPAP at night  H/o depression: Stable - Continue home lexapro  FEN/GI:  -Heart healthy and carb modified diet -IV fluid as above  Prophylaxis: Switched to Lovenox  Disposition:  continue MedSurg. Consider transfer to telemetry if troponin rises or anginal symptoms  Subjective:  Discussed CT abdomen results with the patient. She is very receptive. She has already discussed the results with her daughter over the phone. She prefers to be comfortable with good symptom management versus pursuing aggressive therapy at this point. She is open to palliative care.   Objective: Temp:  [98 F (36.7 C)-98.7 F (37.1 C)] 98 F (36.7 C) (01/27 0547) Pulse Rate:  [58-63] 60 (01/27 0547) Resp:  [16-25] 18 (01/27 0547) BP: (129-147)/(36-45) 147/45 (01/27 0547) SpO2:  [96 %-100 %] 98 % (01/27 0547) Weight:  [90.7 kg (200 lb)-92.1 kg (203 lb 0.7 oz)] 92.1 kg (203 lb 0.7 oz) (01/27 0004) Physical Exam: GEN: appears well, lying in bed, no apparent distress. CVS: RRR, nl s1 & s2, 2/6 SEM LUSB>RUSB, carotid bruits left > right RESP: speaks in full sentence, no IWOB, good air movement bilaterally, CTAB GI: BS present & normal, soft, diffusely tender to palpation, rebound tenderness over RLQ GU: no suprapubic or CVA tenderness MSK: no focal tenderness or notable swelling SKIN: no apparent skin lesion NEURO: alert and oiented appropriately, no gross defecits  PSYCH: euthymic mood with congruent affect  Laboratory:  Recent Labs Lab 04/17/16 1916 04/18/16 0317  WBC 27.4* 24.9*  HGB 8.2* 7.2*  HCT 27.1* 23.6*  PLT 273 263    Recent Labs Lab 04/17/16 1916 04/18/16 0317  NA 136 134*  K 4.5 3.9  CL 111 110  CO2 17* 17*  BUN 41* 37*   CREATININE 1.52* 1.36*  CALCIUM 7.6* 7.2*  PROT 6.0*  --   BILITOT 0.4  --   ALKPHOS 117  --   ALT 18  --   AST 13*  --   GLUCOSE 190* 124*    Imaging/Diagnostic Tests: Ct Abdomen Pelvis Wo Contrast  Result Date: 04/18/2016 IMPRESSION: Progression of metastatic disease with new masses involving the small bowel in the mid abdomen, midportion of the transverse colon, right adrenal gland, and right gluteal soft tissue. An ovoid lesion superficial to the right lobe of the liver is also suspicious for metastatic disease. Inflammatory mass involving the small bowel causes dilatation of the involved segment of the bowel. Although this most likely represents metastatic disease related to patient's primary lung cancer, lymphoma is not excluded. Mild diffuse thickening of the colonic mucosa may represent associated colitis. Correlation with clinical exam and stool cultures recommended. No bowel obstruction. Normal appendix. Thickening of the left pleural with small left pleural effusion, new since the prior CT and concerning for metastatic disease to the pleura. Whole-body PET-CT recommended for further evaluation. Stable right lung base scarring/postradiation fibrosis. Electronically Signed   By: Anner Crete M.D.   On: 04/18/2016 03:47    Mercy Riding, MD 04/18/2016, 8:53 AM PGY-2, [place] Intern pager: [phone], text pages welcome

## 2016-04-18 NOTE — Consult Note (Signed)
Consultation Note Date: 04/18/2016   Patient Name: Natalie Porter  DOB: 1940/09/20  MRN: [id number]  Age / Sex: 76 y.o., female  PCP: Natalie Coombe, MD Referring Physician: No att. providers found  Reason for Consultation: Establishing goals of care d/t newly found widely metastatic cancer (has been receiving treatment for lung cancer but mets are new).   HPI/Patient Profile: 76 y.o. female  with past medical history of lung CA, HTN, HLD, DM2, COPD, DVT, CHF, CAD admitted on 04/17/2016 with N/V, diarrhea, abd pain and decreased intake. Extensive mets found on CT scan. Likely infectious colitis on CT but C. diff negative. Palliative care consulted to assist with GOC d/t to progressing cancer and assist with symptom burden.   Clinical Assessment and Goals of Care: I met today with Natalie Porter's daughter/HCPOA, Natalie Porter, who was anxious and tearful. She is very upset about the spread of her mother's cancer. She tells me that she has worked so hard with her mother to manage and control her heart failure, diabetes, and meet her treatments for her cancer. Natalie Porter is devastated after all the work that they are at this point.   Natalie Porter tells me that her mother called and told her this morning that she is done. She wants no more treatment and is ready to die. [name] is very religious and Natalie Porter says that her mother has peace with this decision. Natalie Porter is faced with trying to tell the rest of the family as well as trying to plan to care for her mother (she also has other stresses in her life right now).   [name] lives with Natalie Porter's daughter, Natalie Porter, who has worked with hospice as a Quar[employer] manager. Natalie Porter says Natalie Porter will want to continue caring for [name]. I did mention hospice given her goals and addressed code status. Natalie Porter believes her mother desires full DNR but wishes for [place] to readdress with her mother.    [name] woke up and I introduced myself and began to talk with her but her son called just as I was beginning my discussion. [name] is trying to help her family through her decisions and declining health.   Palliative will f/u tomorrow afternoon to discuss GOC/DNR with [name] as well as talk further with Natalie Porter and Natalie Porter as requested.   Primary Decision Maker HCPOA daughter Natalie Porter but patient can speak for herself at this time    SUMMARY OF RECOMMENDATIONS   - Will need to address code status and GOC with patient - Will need to discuss hospice - Will need to include Natalie Porter and Natalie Porter in conversation  Code Status/Advance Care Planning:  Limited code   Symptom Management:   N/V/diarrhea: r/o infectious etiology that may be contributing. Natalie Porter says this has been much improved today. I will not add or change regimen now until infectious etiology r/o as this is currently controlled.   Palliative Prophylaxis:   Bowel Regimen, Delirium Protocol and Frequent Pain Assessment   Psycho-social/Spiritual:   Desire for further Chaplaincy support:yes  Additional Recommendations: Caregiving  Support/Resources, Education on Hospice and Grief/Bereavement Support  Prognosis:   < 6 months  Discharge Planning: Home with Hospice      Primary Diagnoses: Present on Admission: . Diarrhea   I have reviewed the medical record, interviewed the patient and family, and examined the patient. The following aspects are pertinent.  Past Medical History:  Diagnosis Date  . Anemia   . Arthritis   . CAD (coronary artery disease)    Cath 2015, 50% calcified LAD, 60% circumflex stenosis, right coronary artery 70% stenosis medical management  . CHF (congestive heart failure) (HCC)    Preserved EF.    Marland Kitchen COPD (chronic obstructive pulmonary disease) ([place])   . Depression   . Diabetes mellitus without complication ([place])    INSULIN DEPENDENT  . DVT (deep venous  thrombosis) ([place])    2011  . Gallstone   . GERD (gastroesophageal reflux disease)   . History of blood transfusion 03/2016  . Hx: UTI (urinary tract infection)   . Hypercholesteremia   . Hypertension   . Iron deficiency anemia   . Lung cancer [facility])    New diagnosis (treated at [facility]).  Going to receive radiation  . Neuropathy ([place])   . Osteoarthritis   . Pneumonia 2014  . PVD (peripheral vascular disease) ([place])   . Radiation 03/26/15, 03/28/15, 04/02/15   right lower lobe nodule SBRT 54 gray  . Renal disorder    stage 3  . SBO (small bowel obstruction) 08/2015  . Sleep apnea    CPAP  . TIA (transient ischemic attack)    PER PATIENT  . Vitamin B12 deficiency    Social History   Social History  . Marital status: Widowed    Spouse name: N/A  . Number of children: 4  . Years of education: N/A   Social History Main Topics  . Smoking status: Former Smoker    Packs/day: 1.00    Years: 34.00    Types: Cigarettes    Quit date: 03/28/1988  . Smokeless tobacco: Never Used  . Alcohol use No  . Drug use: No  . Sexual activity: Not Asked     Comment: quit 1990   Other Topics Concern  . None   Social History Narrative   Lives alone. Widowed.   Retired.   Family History  Problem Relation Age of Onset  . Heart attack Mother 60  . Stroke Father   . Heart attack Father 86  . Cancer Sister 97    Uterian  . Cancer Sister     Lung   Scheduled Meds: . sodium chloride   Intravenous Once  . aspirin EC  81 mg Oral Daily  . atorvastatin  80 mg Oral Daily  . carvedilol  6.25 mg Oral BID WC  . chlorhexidine  15 mL Mouth Rinse BID  . enoxaparin (LOVENOX) injection  40 mg Subcutaneous Q24H  . escitalopram  20 mg Oral Daily  . insulin aspart  0-15 Units Subcutaneous TID WC  . mouth rinse  15 mL Mouth Rinse q12n4p  . pantoprazole (PROTONIX) IV  40 mg Intravenous Q24H  . tiotropium  18 mcg Inhalation Daily   Continuous Infusions: PRN Meds:.acetaminophen **OR** acetaminophen,  ipratropium-albuterol, promethazine Allergies  Allergen Reactions  . Codeine Nausea Only    Can tolerate in lower doses  . Morphine And Related Nausea And Vomiting    Can tolerate in lower doses   Review of Systems  Constitutional: Positive for activity change, appetite  change and fatigue.  Gastrointestinal: Positive for diarrhea, nausea and vomiting.  Neurological: Positive for weakness.    Physical Exam  Constitutional: She is oriented to person, place, and time. She appears well-developed.  HENT:  Head: Normocephalic and atraumatic.  Cardiovascular: Normal rate and regular rhythm.   Pulmonary/Chest: Effort normal. No accessory muscle usage. No tachypnea. No respiratory distress.  Room air but using CPAP while napping  Abdominal: Normal appearance. There is tenderness.  Neurological: She is alert and oriented to person, place, and time.  Was initially sleeping when I visit  Nursing note and vitals reviewed.   Vital Signs: BP (!) 143/50 (BP Location: Left Arm)   Pulse 68   Temp 98 F (36.7 C) (Oral)   Resp 18   Ht _0  (1.575 m)   Wt 92.1 kg (203 lb 0.7 oz)   SpO2 98%   BMI 37.14 kg/m  Pain Assessment: No/denies pain       SpO2: SpO2: 98 % O2 Device:SpO2: 98 % O2 Flow Rate: .   IO: Intake/output summary:  Intake/Output Summary (Last 24 hours) at 04/18/16 1622 Last data filed at 04/18/16 0900  Gross per 24 hour  Intake             1050 ml  Output                0 ml  Net             1050 ml    LBM: Last BM Date: 04/17/16 Baseline Weight: Weight: 90.7 kg (200 lb) Most recent weight: Weight: 92.1 kg (203 lb 0.7 oz)     Palliative Assessment/Data:     Time Total: 49mn  Greater than 50%  of this time was spent counseling and coordinating care related to the above assessment and plan.  Signed by: AVinie Sill NP Palliative Medicine Team Pager # [phone](M-F 8a-5p) Team Phone # [phone](Nights/Weekends)

## 2016-04-18 NOTE — Progress Notes (Signed)
New Admission Note:  Arrival Method: Stretcher from ED Mental Orientation: A&O x4 Telemetry: N/A Assessment: Completed Skin: Assessed with Perry Mount, RN, check flowsheets IV: R AC, Normal saline infusing at 75 ml/hr Pain: 0/10 Tubes: N/A Safety Measures: Safety Fall Prevention Plan discussed with patient Admission: Completed [address] Orientation: Patient has been orientated to the room, unit and the staff. Family: None at the bedside  Orders have been reviewed and implemented. Will continue to monitor the patient. Call light has been placed within reach and bed alarm has been activated.   Nena Polio BSN, RN  Phone Number: [phone]

## 2016-04-18 NOTE — Progress Notes (Signed)
Physical Therapy Treatment Patient Details Name: Natalie Porter MRN: [id number] DOB: 1940-05-25 Today's Date: 04/18/2016    History of Present Illness 76 Y/O F with PMX of Lung CA, HTN, HLD, DM2, COPD, DVT, CHF, CAD, presented with hx of nausea and vomiting for about 6 days associated with generalized abdominal pain and diarrhea. She stated her diarrhea has actually been on going for weeks but now worsening    PT Comments    Pt admitted with/for SOB and decrease appetite.  Stomach CA found on admission.  Pt is still able to mobilize with supervision to min assist.  Pt currently limited functionally due to the problems listed below.  (see problems list.)  Pt will benefit from PT to maximize function and safety to be able to get home safely with available assist of family.   Follow Up Recommendations  Home health PT     Equipment Recommendations  Hospital bed (for SOB/nausea in a flat position and to ease ingress/egress)    Recommendations for Other Services       Precautions / Restrictions Precautions Precautions: Fall    Mobility  Bed Mobility Overal bed mobility: Needs Assistance Bed Mobility: Supine to Sit;Sit to Supine     Supine to sit: Supervision Sit to supine: Supervision      Transfers Overall transfer level: Needs assistance   Transfers: Sit to/from Stand Sit to Stand: Min guard            Ambulation/Gait Ambulation/Gait assistance: Min guard;Min assist Ambulation Distance (Feet): 70 Feet Assistive device: Rolling walker (2 wheeled) Gait Pattern/deviations: Step-through pattern   Gait velocity interpretation: Below normal speed for age/gender General Gait Details: Initially steady to degraded gait with fatigue and nausea.   Stairs            Wheelchair Mobility    Modified Rankin (Stroke Patients Only)       Balance Overall balance assessment: Needs assistance Sitting-balance support: Feet supported;No upper extremity  supported Sitting balance-Leahy Scale: Good     Standing balance support: No upper extremity supported Standing balance-Leahy Scale: Fair                      Cognition Arousal/Alertness: Awake/alert Behavior During Therapy: WFL for tasks assessed/performed Overall Cognitive Status: Within Functional Limits for tasks assessed                      Exercises      General Comments        Pertinent Vitals/Pain Pain Assessment: Faces Faces Pain Scale: Hurts a little bit Pain Location: abdomen Pain Descriptors / Indicators: Sore Pain Intervention(s): Monitored during session    Home Living Family/patient expects to be discharged to:: Private residence Living Arrangements: Other (Comment) (grand daughter and great grand daughter) Available Help at Discharge: Family;Available 24 hours/day Type of Home: House Home Access: Stairs to enter;Ramped entrance   Home Layout: One level Home Equipment: Walker - 4 wheels;Shower seat      Prior Function Level of Independence: Independent with assistive device(s)      Comments: pt sleeps alot, goes to bathroom and kitchen. goes out only to go to MD   PT Goals (current goals can now be found in the care plan section) Acute Rehab PT Goals Patient Stated Goal: go home and make the best of my situation PT Goal Formulation: With patient Time For Goal Achievement: 05/02/16 Potential to Achieve Goals: Fair    Frequency  Min 3X/week      PT Plan      Co-evaluation             End of Session   Activity Tolerance: Patient tolerated treatment well Patient left: in bed;with call bell/phone within reach;with bed alarm set;with family/visitor present     Time: 8472-0721 PT Time Calculation (min) (ACUTE ONLY): 30 min  Charges:  $Gait Training: 8-22 mins                    G Codes:      Natalie Porter 04/18/2016, 1:20 PM 04/18/2016  Donnella Sham, PT [phone]  (pager)

## 2016-04-18 NOTE — Progress Notes (Signed)
Patient placed herself on home CPAP with nasal pillow form home. Tolerating well. No changes

## 2016-04-19 DIAGNOSIS — R110 Nausea: Secondary | ICD-10-CM

## 2016-04-19 DIAGNOSIS — Z7189 Other specified counseling: Secondary | ICD-10-CM

## 2016-04-19 DIAGNOSIS — Z515 Encounter for palliative care: Secondary | ICD-10-CM

## 2016-04-19 DIAGNOSIS — Z66 Do not resuscitate: Secondary | ICD-10-CM

## 2016-04-19 LAB — TYPE AND SCREEN
ABO/RH(D): A POS
Antibody Screen: NEGATIVE
UNIT DIVISION: 0

## 2016-04-19 LAB — TROPONIN I: TROPONIN I: 0.55 ng/mL — AB (ref <0.03)

## 2016-04-19 LAB — GLUCOSE, CAPILLARY
GLUCOSE-CAPILLARY: 91 mg/dL (ref 65–99)
Glucose-Capillary: 110 mg/dL — ABNORMAL HIGH (ref 65–99)

## 2016-04-19 MED ORDER — PANTOPRAZOLE SODIUM 40 MG PO TBEC: 40.0000 mg | DELAYED_RELEASE_TABLET | Freq: Every day | ORAL | Status: DC

## 2016-04-19 MED ORDER — PROMETHAZINE HCL 12.5 MG PO TABS: 12.5000 mg | ORAL_TABLET | Freq: Four times a day (QID) | ORAL | 0 refills | Status: AC | PRN

## 2016-04-19 MED ORDER — INSULIN GLARGINE 100 UNIT/ML ~~LOC~~ SOLN
10.0000 [IU] | Freq: Every day | SUBCUTANEOUS | Status: DC
  Filled 2016-04-19: qty 0.1

## 2016-04-19 NOTE — Progress Notes (Signed)
Patient discharge teaching given, including activity, diet, follow-up appoints, and medications. Patient verbalized understanding of all discharge instructions. IV access was d/c'd. Vitals are stable. Patient's person CPAP sent home with patient.  Hospice services setup through [facility]  Skin is intact except as charted in most recent assessments. Pt to be escorted out by NT, to be driven home by family.  Jillyn Ledger, MBA, BSN, RN

## 2016-04-19 NOTE — Discharge Summary (Signed)
[facility] Discharge Summary  Patient name: Natalie Porter Medical record number: [id number] Date of birth: September 06, 1940 Age: 76 y.o. 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 rate 18, height _0  (1.575 m), weight 203 lb 0.7 oz (92.1 kg), SpO2 97 %. GEN: appears well, lying in bed, no apparent distress. CVS: RRR, nl s1 &s2, 2/6 SEM LUSB>RUSB, carotid bruits left > right RESP: speaks in full sentence, no IWOB, good air movement bilaterally, CTAB GI: BS present &normal, soft, diffusely tender to palpation, rebound tenderness over RLQ GU: no suprapubic or CVA tenderness MSK: no focal tenderness or notable swelling SKIN: no apparent skin lesion NEURO: alert and oiented appropriately, no gross defecits  PSYCH: euthymic mood with congruent affect  Brief Hospital Course:  Patient is a 76 year old female with history of lung cancer who presented with a week of nausea, vomiting, diarrhea, weakness. She initially met sepsis criteria with a  Fever of 101.3 and leukocytosis of 27.3. Patient underwent abdominal CT which showed metastatic involvement of the small bowel, transverse colon, right adrenal gland, and right gluteal soft tissue. Patient's infectious work up, including GI pathogen panel and c diff screens were negative. Patient met with the palliative care team and elected to pursue comfort care. Her symptoms were well controlled with  phenergan at the time of discharge and she was able to tolerate PO without significant abdominal pain, nausea, or vomiting. She was discharged with home hospice in stable condition.  Issues for Follow Up:  1. Patient discharge home with comfort care and home hospice. She wishes to never return to the hospital. A DME order for bedside commode and hospital bed was placed at discharge. 2. Her statin was stopped prior to discharge.Consider stopping other non-essential medications as outpatient.   Significant Procedures: None  Significant Labs and Imaging:   Recent Labs Lab 04/17/16 1916 04/18/16 0317 04/18/16 2206  WBC 27.4* 24.9*  --   HGB 8.2* 7.2* 9.2*  HCT 27.1* 23.6* 28.7*  PLT 273 263  --     Recent Labs Lab 04/17/16 1916 04/18/16 0317  NA 136 134*  K 4.5 3.9  CL 111 110  CO2 17* 17*  GLUCOSE 190* 124*  BUN 41* 37*  CREATININE 1.52* 1.36*  CALCIUM 7.6* 7.2*  ALKPHOS 117  --   AST 13*  --   ALT 18  --   ALBUMIN 1.6*  --    Ct Abdomen Pelvis Wo Contrast  Result Date: 04/18/2016 CLINICAL DATA:  76 year old female with abdominal pain and diarrhea. History of lung cancer. EXAM: CT ABDOMEN AND PELVIS WITHOUT CONTRAST TECHNIQUE: Multidetector CT imaging of the abdomen and pelvis was performed following the standard protocol without IV contrast. COMPARISON:  Abdominal CT dated 09/24/2015 and ultrasound dated 12/17/2015 FINDINGS: Evaluation of this exam is limited in the absence of intravenous contrast. Lower chest: There is pleural thickening at the left lung base with probable trace left pleural effusion, new from prior studies. Right lung base density appears similar to prior CT most compatible with scarring  and postradiation changes. There is hypoattenuation of the cardiac blood pool suggestive of a degree of anemia. Clinical correlation is recommended. There is coronary vascular calcification. No intra-abdominal free air or free fluid. Hepatobiliary: Small scattered hepatic  calcific foci most consistent with old granuloma. No intrahepatic biliary ductal dilatation. There is a 2.5 x 1.0 x 2.5 cm ovoid density along the surface of the right lobe of the liver (series 2, image 18 and coronal image 107) suspicious for a metastatic peritoneal disease. Multiple stones noted within the gallbladder. No pericholecystic fluid. Pancreas: Unremarkable. No pancreatic ductal dilatation or surrounding inflammatory changes. Spleen: Small scattered calcified splenic granuloma. The spleen is otherwise unremarkable. Adrenals/Urinary Tract: There is a 3.5 x 3.7 x 4.8 cm mass arising from the right adrenal gland which is new compared to the prior CT and most consistent with metastatic disease. The left adrenal gland appears unremarkable. Renal vascular calcification. There is no hydronephrosis or nephrolithiasis on either side. The visualized ureters and urinary bladder appear unremarkable. Stomach/Bowel: There is a 10 x 10 cm inflammatory mass with irregular and nodular margins involving a loop of small bowel in the mid abdomen to the right of the midline. There is associated aneurysmal dilatation of the involved segment of small bowel. The mass extends to the serosal surface of the proximal portion of the transverse colon. There is approximately 8 cm segmental thickening of the midportion of the transverse colon. There is mild diffuse mucosal thickening of the entire colon. There is no evidence of bowel obstruction. Normal appendix. Vascular/Lymphatic: Advanced aortoiliac atherosclerotic disease with high-grade narrowing of the aortic lumen distally. There is high-grade stenosis of the common iliac artery is. Evaluation of the vasculature is limited in the absence of intravenous contrast. There is atherosclerotic calcification of the mesenteric vasculature. The IVC appears unremarkable. No portal venous gas identified. A fem-fem bypass graft is noted. Reproductive: The uterus and ovaries are grossly  unremarkable. Other: There is a 4.4 x 3.1 cm ovoid soft tissue mass in the right gluteal region superficial to the gluteus medius and along the superior margin of the right gluteus maximus muscle. This is new since the prior CT. Musculoskeletal: Osteopenia with multilevel degenerative changes of the spine. No acute fracture. No definite suspicious osseous lesions. However evaluation for lesions is limited due to osteopenia. IMPRESSION: Progression of metastatic disease with new masses involving the small bowel in the mid abdomen, midportion of the transverse colon, right adrenal gland, and right gluteal soft tissue. An ovoid lesion superficial to the right lobe of the liver is also suspicious for metastatic disease. Inflammatory mass involving the small bowel causes dilatation of the involved segment of the bowel. Although this most likely represents metastatic disease related to patient's primary lung cancer, lymphoma is not excluded. Mild diffuse thickening of the colonic mucosa may represent associated colitis. Correlation with clinical exam and stool cultures recommended. No bowel obstruction. Normal appendix. Thickening of the left pleural with small left pleural effusion, new since the prior CT and concerning for metastatic disease to the pleura. Whole-body PET-CT recommended for further evaluation. Stable right lung base scarring/postradiation fibrosis. Electronically Signed   By: Anner Crete M.D.   On: 04/18/2016 03:47   Results/Tests Pending at Time of Discharge: None  Discharge Medications:  Allergies as of 04/19/2016      Reactions   Codeine Nausea Only   Can tolerate in lower doses   Morphine And Related Nausea And Vomiting   Can tolerate in lower doses  Medication List    STOP taking these medications   atorvastatin 80 MG tablet Commonly known as:  LIPITOR     TAKE these medications   acetaminophen-codeine 300-15 MG tablet Commonly known as:  TYLENOL #2 Take 1 tablet by  mouth every 4 (four) hours as needed for moderate pain.   albuterol 108 (90 Base) MCG/ACT inhaler Commonly known as:  PROVENTIL HFA;VENTOLIN HFA Inhale 2 puffs into the lungs every 4 (four) hours as needed for wheezing or shortness of breath.   amLODipine 10 MG tablet Commonly known as:  NORVASC Take 1 tablet (10 mg total) by mouth daily.   aspirin EC 81 MG tablet Take 81 mg by mouth daily.   bisacodyl 10 MG suppository Commonly known as:  DULCOLAX Place 1 suppository (10 mg total) rectally as needed for moderate constipation.   CALCIUM-CARB 600 PO Take 600 mg by mouth 2 (two) times daily.   carvedilol 25 MG tablet Commonly known as:  COREG Take 1 tablet (25 mg total) by mouth 2 (two) times daily with a meal.   escitalopram 20 MG tablet Commonly known as:  LEXAPRO Take 1 tablet (20 mg total) by mouth daily.   fluticasone 50 MCG/ACT nasal spray Commonly known as:  FLONASE Place 2 sprays into both nostrils daily. What changed:  when to take this  reasons to take this   furosemide 80 MG tablet Commonly known as:  LASIX Take 1 tablet (80 mg total) by mouth daily.   gabapentin 100 MG capsule Commonly known as:  NEURONTIN Take 2-3 capsules (200-300 mg total) by mouth 2 (two) times daily. 200 mg in the morning and 300 mg at bedtime What changed:  when to take this  additional instructions   glucose blood test strip Use as instructed   insulin glargine 100 UNIT/ML injection Commonly known as:  LANTUS Inject 0.2 mLs (20 Units total) into the skin at bedtime.   ipratropium-albuterol 0.5-2.5 (3) MG/3ML Soln Commonly known as:  DUONEB Take 3 mLs by nebulization every 4 (four) hours as needed. What changed:  when to take this   omeprazole 20 MG capsule Commonly known as:  PRILOSEC Take 1 capsule (20 mg total) by mouth daily.   polyethylene glycol powder powder Commonly known as:  GLYCOLAX/MIRALAX Take 17 g by mouth 3 (three) times daily as needed for moderate  constipation.   promethazine 12.5 MG tablet Commonly known as:  PHENERGAN Take 1 tablet (12.5 mg total) by mouth every 6 (six) hours as needed for nausea or vomiting.   promethazine 12.5 MG tablet Commonly known as:  PHENERGAN Take 1 tablet (12.5 mg total) by mouth every 6 (six) hours as needed for nausea or vomiting.   tiotropium 18 MCG inhalation capsule Commonly known as:  SPIRIVA Place 1 capsule (18 mcg total) into inhaler and inhale daily.   Vitamin D (Ergocalciferol) 50000 units Caps capsule Commonly known as:  DRISDOL Take 50,000 Units by mouth every 7 (seven) days. Take on fridays       Discharge Instructions: Please refer to Patient Instructions section of EMR for full details.  Patient was counseled important signs and symptoms that should prompt return to medical care, changes in medications, dietary instructions, activity restrictions, and follow up appointments.   Follow-Up Appointments: Follow-up Information    [facility] Follow up.   Specialty:  Home Health Services Why:  you will receive a call from this agency for your hospice and palliative care needs Contact information: [address] [place] [address] [phone]           Vivi Barrack, MD 04/19/2016, 4:34 PM PGY-3, [place]

## 2016-04-19 NOTE — Care Management Note (Signed)
Case Management Note  Patient Details  Name: Natalie Porter MRN: [id number] Date of Birth: 1940/09/22  Subjective/Objective:                  infectious colitis Action/Plan: Discharge planning Expected Discharge Date:  04/19/16               Expected Discharge Plan:  [place]  In-House Referral:     Discharge planning Services  CM Consult  Post Acute Care Choice:  Hospice Choice offered to:  Patient  DME Arranged:  3-N-1, Hospital bed DME Agency:     [place]:    [place]  Status of Service:  Completed, signed off  If discussed at [employer], dates discussed:    Additional Comments: CM met with family for choice of home hospice.  Family choose [facility]; referral called to [place].  CM faxed all information to [facility].  No other CM needs were communicated. Dellie Catholic, RN 04/19/2016, 4:33 PM

## 2016-04-19 NOTE — Progress Notes (Signed)
Daily Progress Note   Patient Name: Natalie Porter       Date: 04/19/2016 DOB: [birth year]  Age: 76 y.o. MRN#: [id number] Attending Physician: No att. providers found Primary Care Physician: Everrett Coombe, MD Admit Date: 04/17/2016  Reason for Consultation/Follow-up: Establishing goals of care and Hospice Evaluation  Subjective: Patient feeling better.  Anticipating discharge today.  Decided on DNR.  Hospice care at home. Does not want to return to the hospital - ever.  Will need a hospital bed and bedside commode at home.   Assessment: Feeling much better.  Excited for D/C   Patient Profile/HPI: 76 y.o. female  with past medical history of lung CA, HTN, HLD, DM2, COPD, DVT, CHF, CAD admitted on 04/17/2016 with N/V, diarrhea, abd pain and decreased intake. Extensive mets found on CT scan. Likely infectious colitis on CT but C. diff negative. Palliative care consulted to assist with GOC d/t to progressing cancer and assist with symptom burden.    Length of Stay: 2  Current Medications: Scheduled Meds:  . sodium chloride   Intravenous Once  . aspirin EC  81 mg Oral Daily  . atorvastatin  80 mg Oral Daily  . carvedilol  6.25 mg Oral BID WC  . chlorhexidine  15 mL Mouth Rinse BID  . escitalopram  20 mg Oral Daily  . insulin glargine  10 Units Subcutaneous QHS  . mouth rinse  15 mL Mouth Rinse q12n4p  . [START ON 04/20/2016] pantoprazole  40 mg Oral Daily  . tiotropium  18 mcg Inhalation Daily    Continuous Infusions:   PRN Meds: acetaminophen **OR** acetaminophen, ipratropium-albuterol, promethazine  Physical Exam        Well developed very pleasant female, alert orientated, clear speech.  Coherent thought.   Vital Signs: BP (!) 157/45 (BP Location: Left Arm)   Pulse 68    Temp 98.2 F (36.8 C) (Oral)   Resp 18   Ht '5\' 2"'$  (1.575 m)   Wt 92.1 kg (203 lb 0.7 oz)   SpO2 97%   BMI 37.14 kg/m  SpO2: SpO2: 97 % O2 Device: O2 Device: Not Delivered O2 Flow Rate:    Intake/output summary:  Intake/Output Summary (Last 24 hours) at 04/19/16 1435 Last data filed at 04/19/16 1356  Gross per 24 hour  Intake  855 ml  Output              250 ml  Net              605 ml   LBM: Last BM Date: 04/18/16 Baseline Weight: Weight: 90.7 kg (200 lb) Most recent weight: Weight: 92.1 kg (203 lb 0.7 oz)       Palliative Assessment/Data:      Patient Active Problem List   Diagnosis Date Noted  . General weakness   . Leukocytosis   . Primary malignant neoplasm of lung metastatic to other site [facility])   . Goals of care, counseling/discussion   . Palliative care encounter   . Diarrhea 04/17/2016  . Mixed simple and mucopurulent chronic bronchitis ([place])   . Atypical pneumonia   . Elevated troponin   . S/P insertion of iliac artery stent 02/27/2016  . History of arterial bypass of lower extremity 02/27/2016  . Acute upper respiratory infection 01/12/2016  . Hyperkalemia 10/27/2015  . Constipation 10/09/2015  . Primary cancer of left upper lobe of lung ([place]) 10/09/2015  . UTI (urinary tract infection) 10/09/2015  . HCAP (healthcare-associated pneumonia) 09/27/2015  . SOB (shortness of breath) 09/27/2015  . OSA on CPAP 07/08/2015  . CAD in native artery 07/08/2015  . Primary cancer of right lower lobe of lung ([place]) 03/07/2015  . COPD exacerbation ([place]) 12/11/2014  . Peripheral vascular disease, unspecified 11/23/2013  . Nonhealing nonsurgical wound 08/17/2013  . Incisional infection 08/11/2013  . CKD (chronic kidney disease) stage 3, GFR 30-59 ml/min 08/09/2013  . Cardiorenal syndrome 08/02/2013  . Diastolic CHF, acute on chronic (HCC) 07/30/2013  . Anemia 07/30/2013  . Hypertension 07/30/2013  . HLD (hyperlipidemia) 07/30/2013  . PAD (peripheral  artery disease) ([place]) 07/21/2013  . Chronic kidney disease (CKD), stage III (moderate) 06/17/2013  . Microalbuminuric diabetic nephropathy ([place]) 06/17/2013  . Leg pain 06/15/2013  . Long term current use of anticoagulant 12/20/2012  . Elevated erythrocyte sedimentation rate 10/20/2012  . Acute DVT (deep venous thrombosis) ([place]) 10/10/2012  . Carotid stenosis 07/27/2012  . Clinical depression 07/27/2012  . Diabetes mellitus, type 2 ([place]) 07/27/2012  . Difficulty hearing 07/27/2012  . Adult BMI 30+ 06/21/2012  . Adiposity 06/21/2012  . History of deep venous thrombosis 12/24/2011  . Vascular disorder of lower extremity 12/24/2011  . COPD (chronic obstructive pulmonary disease) ([place]) 09/15/2011  . Lung nodule, multiple 09/15/2011  . Biliary calculi 08/04/2011  . Allergic rhinitis 07/24/2011  . Obstructive apnea 07/24/2011  . Arthritis, degenerative 07/24/2011  . Ex-cigarette smoker 07/23/2011  . Personal history of nicotine dependence 07/23/2011  . Arthritis or polyarthritis, rheumatoid ([place]) 07/23/2011    Palliative Care Plan    Recommendations/Plan:  D/C to home with Hospice Care. - [facility].   Needs bed and bedside commode.  Family requests only hospice RN.  They do not want a hospice CNA.  They want to perform the CNA duties themselves.  Goals of Care and Additional Recommendations:  Limitations on Scope of Treatment: Avoid Hospitalization, Full Comfort Care and Minimize Medications Will write for phenergan on d/c for nausea  Recommend discontinuing statin as it may contribute to nausea  No return to the hospital  DNR form completed and on chart  Code Status:  DNR  Prognosis:   < 6 months secondary to widely metastatic lung cancer.   Discharge Planning:  Home with Hospice  Care plan was discussed with Bedside RN, patient and family.  Thank you for  allowing the Palliative Medicine Team to assist in the care of this patient.  Total time spent:   35 min.     Greater than 50%  of this time was spent counseling and coordinating care related to the above assessment and plan.  Imogene Burn, PA-C Palliative Medicine  Please contact Palliative MedicineTeam phone at [phone] for questions and concerns between 7 am - 7 pm.   Please see AMION for individual provider pager numbers.

## 2016-04-19 NOTE — Progress Notes (Signed)
Family Medicine Teaching Service Daily Progress Note Intern Pager: [phone]  Patient name: Natalie Porter Medical record number: [id number] Date of birth: 12/09/1940 Age: 76 y.o. Gender: female  Primary Care Provider: Everrett Coombe, MD Consultants: Oncology, palliative Code Status: DO NOT INTUBATE  Pt Overview and Major Events to Date:  01/26: Admitted with nausea vomiting diarrhea and abdominal pain  Assessment and Plan: Natalie Tortorelli McAlisteris a 76 y.o.femalepresenting with cough and shortness of breath. PMH is significant for HFpEF, T2DM, stage 3 CKD, COPD, GERD, PVD, CAD, HLD, OSA, anemia, lung CA, osteoarthritis.   Metastatic Lung Adenocarcinoma, Status post radiation in January 2017 and August 2017. Now with metastasis to GI tract, liver, adrenal gland and gluteal muscles area on CT scan. Patient says that she is at peace and wants to go home to be with her family. - Patient has expressed wanting to be on "comfort care" - Follow up palliative recs - Hopeful for discharge later today.   Nausea/vomiting/diarrhea/abdominal pain: Likely secondary to mets. C diff and GI panel negative. - Phenergan for n/v  Diastolic CHF, stable. Patient without dyspnea, edema or chest pain. Euvolemic on exam. Last echo May 2015 EF 60-65% aortic valve thickened and calcified c/w sclerosis and mild regurg, mild LAE. However, weight up by 10 from last admission. Chest x-ray with stable cardiomegaly.  BNP elevated to 413 but appears to be baseline. Status post IV Lasix 40 mg once - Stop IV fluids  CKD Stage 3: Creatinine 1.36. 1.52 on admission. Baseline creatinine ~1.4,  - hold home valsartan and Lasix in the setting of nausea vomiting and diarrhea - monitor BMP  Elevated troponin/hx of CAD - denies chest pain or dyspnea. Cath 2015, 50% calcified LAD, 60% circumflex stenosis, right coronary artery 70% stenosis medical management. On carvedilol 25 mg twice a day, atorvastatin and aspirin -  Continue home statin and aspirin - Continue carvedilol at 6.25 mg twice a day  COPD-likely stage D. Stable: Patient without respiratory symptoms. Lung exam within normal limits. On DuoNeb and Spiriva at home - Continue home Spiriva -  DuoNeb every 4 hours as needed -  Oxygen as needed to keep saturation greater than 90%  HTN: Normotensive. Concern for orthostatic hypotension in the setting of dehydration. On norvasc '10mg'$  qd, coreg '25mg'$  bid.  -Continue Coreg at 6.25 mg twice a day given bradycardia -Hold other medications  T2DM on lantus 20U at home. Hasn't used in the last 2 days. Last A1c 7.8 on 02/06/2016. CBG 192 on arrival. Bicarbonate 17. No Anion gap - SSI-thin - monitor CBGs  Anemia of chronic disease.  Stable.  Goal hemoglobin 8.0 given history of CAD. -Will transfuse 1 unit with IV Lasix - monitor CBC  PVD, stable. Has h/o fem-fem bypass graft in 2015 and is s/p iliac artery stent. - Continue home gabapentin - Continue home statin and aspirin as above  HLD: Last lipid panel on 11/11/2015. LDL was 71 - Continue home atorvastatin '80mg'$  qd   GERD - continue home ppi   OSA: On CPAP at home - CPAP at night  H/o depression: Stable - Continue home lexapro  FEN/GI:  -Heart healthy and carb modified diet -IV fluid as above  Prophylaxis: Switched to Lovenox  Disposition:  continue MedSurg.   Subjective:  Pain and n/v much improved today. Would like to go home to be with her family. Wants "comfort care"  Objective: Temp:  [98 F (36.7 C)-98.8 F (37.1 C)] 98.8 F (37.1 C) (01/28 0645) Pulse Rate:  [  62-76] 76 (01/28 0645) Resp:  [16-20] 16 (01/28 0645) BP: (135-154)/(31-50) 135/45 (01/28 0645) SpO2:  [96 %-100 %] 96 % (01/28 0821) Physical Exam: GEN: appears well, lying in bed, no apparent distress. CVS: RRR, nl s1 & s2, 2/6 SEM LUSB>RUSB, carotid bruits left > right RESP: speaks in full sentence, no IWOB, good air movement bilaterally, CTAB GI: BS  present & normal, soft, diffusely tender to palpation, rebound tenderness over RLQ GU: no suprapubic or CVA tenderness MSK: no focal tenderness or notable swelling SKIN: no apparent skin lesion NEURO: alert and oiented appropriately, no gross defecits  PSYCH: euthymic mood with congruent affect  Laboratory:  Recent Labs Lab 04/17/16 1916 04/18/16 0317 04/18/16 2206  WBC 27.4* 24.9*  --   HGB 8.2* 7.2* 9.2*  HCT 27.1* 23.6* 28.7*  PLT 273 263  --     Recent Labs Lab 04/17/16 1916 04/18/16 0317  NA 136 134*  K 4.5 3.9  CL 111 110  CO2 17* 17*  BUN 41* 37*  CREATININE 1.52* 1.36*  CALCIUM 7.6* 7.2*  PROT 6.0*  --   BILITOT 0.4  --   ALKPHOS 117  --   ALT 18  --   AST 13*  --   GLUCOSE 190* 124*    Imaging/Diagnostic Tests: None New.  Vivi Barrack, MD 04/19/2016, 8:49 AM PGY-3, [place] Intern pager: [phone], text pages welcome

## 2016-04-20 ENCOUNTER — Telehealth: Payer: Self-pay | Admitting: *Deleted

## 2016-04-20 DIAGNOSIS — Z8659 Personal history of other mental and behavioral disorders: Secondary | ICD-10-CM | POA: Diagnosis not present

## 2016-04-20 DIAGNOSIS — C7989 Secondary malignant neoplasm of other specified sites: Secondary | ICD-10-CM | POA: Diagnosis not present

## 2016-04-20 DIAGNOSIS — Z8679 Personal history of other diseases of the circulatory system: Secondary | ICD-10-CM | POA: Diagnosis not present

## 2016-04-20 DIAGNOSIS — Z8739 Personal history of other diseases of the musculoskeletal system and connective tissue: Secondary | ICD-10-CM | POA: Diagnosis not present

## 2016-04-20 DIAGNOSIS — C785 Secondary malignant neoplasm of large intestine and rectum: Secondary | ICD-10-CM | POA: Diagnosis not present

## 2016-04-20 DIAGNOSIS — Z8701 Personal history of pneumonia (recurrent): Secondary | ICD-10-CM | POA: Diagnosis not present

## 2016-04-20 DIAGNOSIS — Z87448 Personal history of other diseases of urinary system: Secondary | ICD-10-CM | POA: Diagnosis not present

## 2016-04-20 DIAGNOSIS — Z862 Personal history of diseases of the blood and blood-forming organs and certain disorders involving the immune mechanism: Secondary | ICD-10-CM | POA: Diagnosis not present

## 2016-04-20 DIAGNOSIS — Z86718 Personal history of other venous thrombosis and embolism: Secondary | ICD-10-CM | POA: Diagnosis not present

## 2016-04-20 DIAGNOSIS — C3490 Malignant neoplasm of unspecified part of unspecified bronchus or lung: Secondary | ICD-10-CM | POA: Diagnosis not present

## 2016-04-20 DIAGNOSIS — Z8669 Personal history of other diseases of the nervous system and sense organs: Secondary | ICD-10-CM | POA: Diagnosis not present

## 2016-04-20 DIAGNOSIS — Z87440 Personal history of urinary (tract) infections: Secondary | ICD-10-CM | POA: Diagnosis not present

## 2016-04-20 DIAGNOSIS — Z8709 Personal history of other diseases of the respiratory system: Secondary | ICD-10-CM | POA: Diagnosis not present

## 2016-04-20 DIAGNOSIS — Z8639 Personal history of other endocrine, nutritional and metabolic disease: Secondary | ICD-10-CM | POA: Diagnosis not present

## 2016-04-20 DIAGNOSIS — C787 Secondary malignant neoplasm of liver and intrahepatic bile duct: Secondary | ICD-10-CM | POA: Diagnosis not present

## 2016-04-20 DIAGNOSIS — C7970 Secondary malignant neoplasm of unspecified adrenal gland: Secondary | ICD-10-CM | POA: Diagnosis not present

## 2016-04-20 MED ORDER — FREESTYLE LITE DEVI: 0 refills | Status: AC

## 2016-04-20 NOTE — Telephone Encounter (Signed)
I called the number provided and spoke with the patient's granddaughter [name].  She is unsure why the patient's daughter called our office, but she thinks it was with questions about whether the [facility]/her primary doctors would continue to follow along with the patient's care now that she is hospice.  I assured her that we will continue to follow her care.    Ms. Natalie Porter reports that today the patient is at peace with the decision to pursue hospice and expresses appreciation towards the family med center team for caring for her grandmother.

## 2016-04-20 NOTE — Telephone Encounter (Signed)
Daughter called. Hospice is being called in. Would like to talk to Dr. Burr Medico. Please call [phone]. Ottis Stain, CMA

## 2016-04-20 NOTE — Telephone Encounter (Signed)
[facility] and Palliative care called needing to Speak with patient's PCP regarding DNR order. PCP pager number given to Hospice.  Derl Barrow, RN

## 2016-04-20 NOTE — Telephone Encounter (Signed)
Called Hospice back regarding DNR. Hospice Nurse was going to have the Hospice Medical Director to sign the order for the DNR.  This is an FYI for patient's record.  Derl Barrow, RN

## 2016-04-20 NOTE — Care Management Note (Addendum)
  Late Entry:  Case Management Note  Patient Details  Name: Jasimine Simms MRN: [id number] Date of Birth: 1941-01-16  Subjective/Objective:          CM following for progression and d/c planning.           Action/Plan: 04/20/2016 Late entry, this CM called and spoke with [facility] and they plan to adm this pt today 04/20/2016 to their services. They have all info needed except name of oncologist , info provided re treatment at [facility] , Dr Gery Pray.   Expected Discharge Date:  04/19/16               Expected Discharge Plan:  [place]  In-House Referral:  NA  Discharge planning Services  CM Consult  Post Acute Care Choice:  Hospice Choice offered to:  Patient  DME Arranged:  3-N-1, Hospital bed DME Agency:     HH Arranged:    [place]  Status of Service:  Completed, signed off  If discussed at [employer], dates discussed:    Additional Comments:  Adron Bene, RN 04/20/2016, 10:56 AM

## 2016-04-20 NOTE — Telephone Encounter (Signed)
"  [facility] [phone]) calling about referral we received from [facility] for this patient of Dr. Ernestina Penna.  Is Dr. Burr Medico in agreement of Hospice referral and will she be the attending.   Please call with orders.

## 2016-04-20 NOTE — Telephone Encounter (Signed)
I called and spoke with [facility] to confirm the hospice referral.

## 2016-04-21 DIAGNOSIS — C787 Secondary malignant neoplasm of liver and intrahepatic bile duct: Secondary | ICD-10-CM | POA: Diagnosis not present

## 2016-04-21 DIAGNOSIS — C7989 Secondary malignant neoplasm of other specified sites: Secondary | ICD-10-CM | POA: Diagnosis not present

## 2016-04-21 DIAGNOSIS — C785 Secondary malignant neoplasm of large intestine and rectum: Secondary | ICD-10-CM | POA: Diagnosis not present

## 2016-04-21 DIAGNOSIS — Z8679 Personal history of other diseases of the circulatory system: Secondary | ICD-10-CM | POA: Diagnosis not present

## 2016-04-21 DIAGNOSIS — C7970 Secondary malignant neoplasm of unspecified adrenal gland: Secondary | ICD-10-CM | POA: Diagnosis not present

## 2016-04-21 DIAGNOSIS — C3490 Malignant neoplasm of unspecified part of unspecified bronchus or lung: Secondary | ICD-10-CM | POA: Diagnosis not present

## 2016-04-22 ENCOUNTER — Inpatient Hospital Stay: Payer: Medicare Other | Admitting: Family Medicine

## 2016-04-23 ENCOUNTER — Telehealth: Payer: Self-pay | Admitting: Internal Medicine

## 2016-04-23 DIAGNOSIS — Z8639 Personal history of other endocrine, nutritional and metabolic disease: Secondary | ICD-10-CM | POA: Diagnosis not present

## 2016-04-23 DIAGNOSIS — Z87448 Personal history of other diseases of urinary system: Secondary | ICD-10-CM | POA: Diagnosis not present

## 2016-04-23 DIAGNOSIS — C785 Secondary malignant neoplasm of large intestine and rectum: Secondary | ICD-10-CM | POA: Diagnosis not present

## 2016-04-23 DIAGNOSIS — C7989 Secondary malignant neoplasm of other specified sites: Secondary | ICD-10-CM | POA: Diagnosis not present

## 2016-04-23 DIAGNOSIS — Z86718 Personal history of other venous thrombosis and embolism: Secondary | ICD-10-CM | POA: Diagnosis not present

## 2016-04-23 DIAGNOSIS — Z8659 Personal history of other mental and behavioral disorders: Secondary | ICD-10-CM | POA: Diagnosis not present

## 2016-04-23 DIAGNOSIS — Z8701 Personal history of pneumonia (recurrent): Secondary | ICD-10-CM | POA: Diagnosis not present

## 2016-04-23 DIAGNOSIS — Z8709 Personal history of other diseases of the respiratory system: Secondary | ICD-10-CM | POA: Diagnosis not present

## 2016-04-23 DIAGNOSIS — C7970 Secondary malignant neoplasm of unspecified adrenal gland: Secondary | ICD-10-CM | POA: Diagnosis not present

## 2016-04-23 DIAGNOSIS — Z8669 Personal history of other diseases of the nervous system and sense organs: Secondary | ICD-10-CM | POA: Diagnosis not present

## 2016-04-23 DIAGNOSIS — Z862 Personal history of diseases of the blood and blood-forming organs and certain disorders involving the immune mechanism: Secondary | ICD-10-CM | POA: Diagnosis not present

## 2016-04-23 DIAGNOSIS — Z8679 Personal history of other diseases of the circulatory system: Secondary | ICD-10-CM | POA: Diagnosis not present

## 2016-04-23 DIAGNOSIS — Z87440 Personal history of urinary (tract) infections: Secondary | ICD-10-CM | POA: Diagnosis not present

## 2016-04-23 DIAGNOSIS — C787 Secondary malignant neoplasm of liver and intrahepatic bile duct: Secondary | ICD-10-CM | POA: Diagnosis not present

## 2016-04-23 DIAGNOSIS — C3490 Malignant neoplasm of unspecified part of unspecified bronchus or lung: Secondary | ICD-10-CM | POA: Diagnosis not present

## 2016-04-23 DIAGNOSIS — Z8739 Personal history of other diseases of the musculoskeletal system and connective tissue: Secondary | ICD-10-CM | POA: Diagnosis not present

## 2016-04-23 NOTE — Telephone Encounter (Signed)
Received page to Emergency After Hours line from Nurse [place]. She reports that patient is in severe discomfort and has a rigid abdomen. Patient has history of lung cancer and was found to have metastases to her bowel 04/19/16. Family elected hospice care. They had hoped to have her cared for at home but would like her transferred to hospice house due to her degree of pain. Ms. Reather Laurence wanted confirmation that [facility] approves of this transfer. Provided verbal approval and will forward this message to patient's PCP and overseeing attending Dr. Gwendlyn Deutscher.   Olene Floss, MD [place], PGY-2

## 2016-04-24 DIAGNOSIS — C785 Secondary malignant neoplasm of large intestine and rectum: Secondary | ICD-10-CM | POA: Diagnosis not present

## 2016-04-24 DIAGNOSIS — C7970 Secondary malignant neoplasm of unspecified adrenal gland: Secondary | ICD-10-CM | POA: Diagnosis not present

## 2016-04-24 DIAGNOSIS — C787 Secondary malignant neoplasm of liver and intrahepatic bile duct: Secondary | ICD-10-CM | POA: Diagnosis not present

## 2016-04-24 DIAGNOSIS — C7989 Secondary malignant neoplasm of other specified sites: Secondary | ICD-10-CM | POA: Diagnosis not present

## 2016-04-24 DIAGNOSIS — Z8679 Personal history of other diseases of the circulatory system: Secondary | ICD-10-CM | POA: Diagnosis not present

## 2016-04-24 DIAGNOSIS — C3490 Malignant neoplasm of unspecified part of unspecified bronchus or lung: Secondary | ICD-10-CM | POA: Diagnosis not present

## 2016-04-24 NOTE — Telephone Encounter (Signed)
Hospice physician order signed and mailed today to [facility] using the return envelop sent by them.

## 2016-04-24 NOTE — Telephone Encounter (Signed)
Medication filled.  Derl Barrow, RN

## 2016-04-25 DIAGNOSIS — C3490 Malignant neoplasm of unspecified part of unspecified bronchus or lung: Secondary | ICD-10-CM | POA: Diagnosis not present

## 2016-04-25 DIAGNOSIS — Z8679 Personal history of other diseases of the circulatory system: Secondary | ICD-10-CM | POA: Diagnosis not present

## 2016-04-25 DIAGNOSIS — C7989 Secondary malignant neoplasm of other specified sites: Secondary | ICD-10-CM | POA: Diagnosis not present

## 2016-04-25 DIAGNOSIS — C787 Secondary malignant neoplasm of liver and intrahepatic bile duct: Secondary | ICD-10-CM | POA: Diagnosis not present

## 2016-04-25 DIAGNOSIS — C785 Secondary malignant neoplasm of large intestine and rectum: Secondary | ICD-10-CM | POA: Diagnosis not present

## 2016-04-25 DIAGNOSIS — C7970 Secondary malignant neoplasm of unspecified adrenal gland: Secondary | ICD-10-CM | POA: Diagnosis not present

## 2016-04-27 ENCOUNTER — Telehealth: Payer: Self-pay | Admitting: *Deleted

## 2016-04-27 ENCOUNTER — Telehealth: Payer: Self-pay | Admitting: Student in an Organized Health Care Education/Training Program

## 2016-04-27 NOTE — Telephone Encounter (Signed)
FYI "Misty S.W. with [facility] calling.  I believe we have a patient of Dr. Ernestina Penna has paassed away."  This is a patient of Dr. Everrett Coombe with Family Medicine and has seen Dr. Sondra Come.

## 2016-04-27 NOTE — Telephone Encounter (Signed)
[facility] called because Natalie Porter passed away on 05/18/2016 at 4:03 AM.

## 2016-05-21 DEATH — deceased

## 2016-06-17 IMAGING — CT CT ANGIO AOBIFEM WO/W CM
1 of 8 series · 2 of 16 positions shown, 3 images · IV contrast ([ID] [id number])
Comparison: None.

CLINICAL DATA: Right femoral bypass graft.  Right buttock pain.

EXAM:
CT ANGIOGRAPHY OF ABDOMINAL AORTA WITH ILIOFEMORAL RUNOFF
TECHNIQUE: Multidetector CT imaging of the abdomen, pelvis and lower
extremities was performed using the standard protocol during bolus
administration of intravenous contrast. Multiplanar CT image
reconstructions and MIPs were obtained to evaluate the vascular
anatomy.
CONTRAST:  100 cc Isovue 370

[Series 4: runoff · axial · 0.92mm/px · z∈[-762,-344]mm · 2 of 493 slices shown, 3 images]
[im 165/493  soft-tissue]
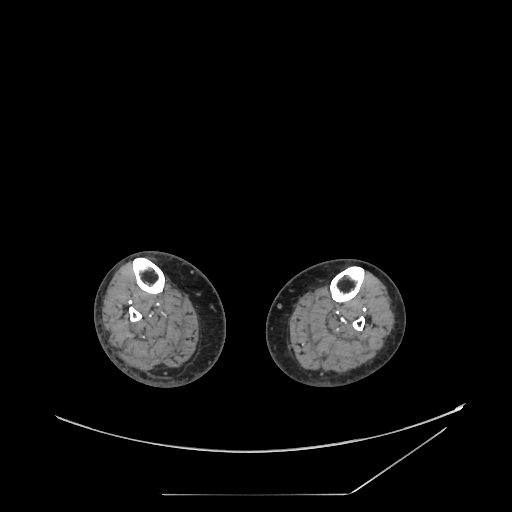
[im 165/493  bone]
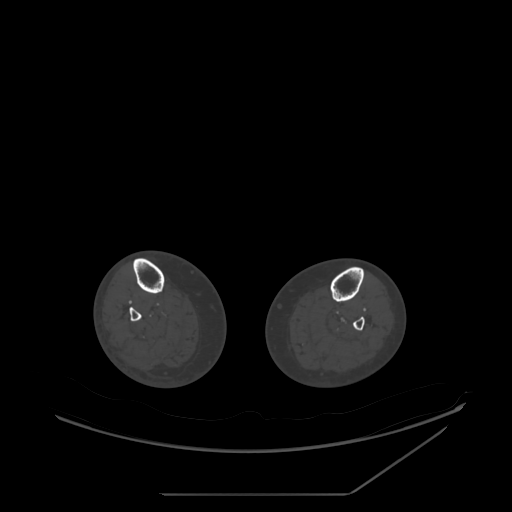
[im 329/493  soft-tissue]
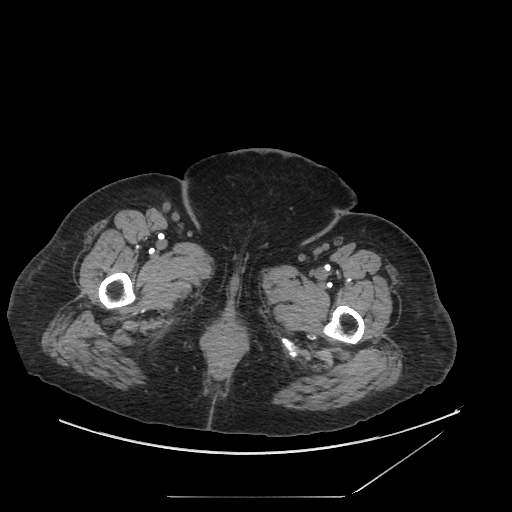

[2 of 16 positions shown; findings below may reference images not displayed]

FINDINGS: There is diffuse atherosclerotic calcification and some irregular
plaque throughout the length of the aorta beginning in the lower
thorax to the bifurcation. There is no significant stenosis or
aneurysmal dilatation.

70% narrowing at the origin of the celiac axis. Branch vessels are
patent.

Atherosclerotic calcification at the origin of the SMA without
significant narrowing.

Single right renal artery and 2 left renal arteries are patent.

IMA origin is occluded. It reconstitutes distally. Branch vessels
are diminutive.

A stent extends from the aorta into the right common iliac artery.
The stent and common iliac artery are patent. Right external and
internal iliac arteries are patent.

Left common iliac artery is occluded. Left external and internal
iliac artery branches reconstitute. There is a right to left femoral
to femoral artery bypass graft which is widely patent.

Right common femoral, profunda femoral, and superficial femoral
artery are patent. Popliteal and tibial arteries are patent for 3
vessel runoff.

Left common femoral, profunda femoral, and superficial femoral
artery are patent. Lip up to 7077 will lower ease are patent for 3
vessel runoff.

14 mm right lower lobe lung nodule is stable. Small right pleural
effusion has developed.

Cholelithiasis

Calcified granulomata in the liver.

Calcified granulomata in the spleen

Pancreas, adrenal glands, and kidneys are within normal limits.

There is no free-fluid

Bladder, uterus, and adnexa are within normal limits.

No vertebral compression deformity. Advanced L5-S1 and L4-5 facet
arthropathy

Review of the MIP images confirms the above findings.
IMPRESSION: Left common iliac artery is occluded. A right to left femoral to
femoral artery bypass graft is patent.

Stented right common iliac artery is patent.

Three vessel runoff bilaterally in the lower extremities.

IMA is occluded.

Significant narrowing at the origin of the celiac axis. SMA is
patent.

Cholelithiasis.

## 2017-03-04 ENCOUNTER — Encounter (HOSPITAL_COMMUNITY): Payer: Medicare Other

## 2017-03-04 ENCOUNTER — Ambulatory Visit: Payer: Medicare Other | Admitting: Family
